# Patient Record
Sex: Female | Born: 1950
Health system: Southern US, Community
[De-identification: ages and names within clinical notes are randomized; demographics above are authoritative.]

## PROBLEM LIST (undated history)

## (undated) DIAGNOSIS — I10 Essential (primary) hypertension: Secondary | ICD-10-CM

## (undated) DIAGNOSIS — G4733 Obstructive sleep apnea (adult) (pediatric): Secondary | ICD-10-CM

## (undated) DIAGNOSIS — Z853 Personal history of malignant neoplasm of breast: Secondary | ICD-10-CM

## (undated) DIAGNOSIS — J849 Interstitial pulmonary disease, unspecified: Secondary | ICD-10-CM

## (undated) DIAGNOSIS — E119 Type 2 diabetes mellitus without complications: Secondary | ICD-10-CM

## (undated) DIAGNOSIS — I2699 Other pulmonary embolism without acute cor pulmonale: Secondary | ICD-10-CM

## (undated) DIAGNOSIS — R809 Proteinuria, unspecified: Secondary | ICD-10-CM

## (undated) DIAGNOSIS — K635 Polyp of colon: Secondary | ICD-10-CM

## (undated) DIAGNOSIS — E785 Hyperlipidemia, unspecified: Secondary | ICD-10-CM

## (undated) DIAGNOSIS — N183 Chronic kidney disease, stage 3 unspecified: Secondary | ICD-10-CM

## (undated) DIAGNOSIS — K5790 Diverticulosis of intestine, part unspecified, without perforation or abscess without bleeding: Secondary | ICD-10-CM

## (undated) DIAGNOSIS — M25812 Other specified joint disorders, left shoulder: Secondary | ICD-10-CM

## (undated) DIAGNOSIS — M7542 Impingement syndrome of left shoulder: Secondary | ICD-10-CM

## (undated) DIAGNOSIS — C801 Malignant (primary) neoplasm, unspecified: Secondary | ICD-10-CM

## (undated) DIAGNOSIS — M75100 Unspecified rotator cuff tear or rupture of unspecified shoulder, not specified as traumatic: Secondary | ICD-10-CM

## (undated) DIAGNOSIS — G4730 Sleep apnea, unspecified: Secondary | ICD-10-CM

## (undated) DIAGNOSIS — D649 Anemia, unspecified: Secondary | ICD-10-CM

## (undated) DIAGNOSIS — D689 Coagulation defect, unspecified: Secondary | ICD-10-CM

## (undated) DIAGNOSIS — I509 Heart failure, unspecified: Secondary | ICD-10-CM

## (undated) DIAGNOSIS — I428 Other cardiomyopathies: Secondary | ICD-10-CM

## (undated) DIAGNOSIS — Z9221 Personal history of antineoplastic chemotherapy: Secondary | ICD-10-CM

## (undated) HISTORY — DX: Chronic kidney disease, stage 3 (moderate): N18.3

## (undated) HISTORY — PX: PORT-A-CATH REMOVAL: SHX5289

## (undated) HISTORY — DX: Anemia, unspecified: D64.9

## (undated) HISTORY — DX: Heart failure, unspecified: I50.9

## (undated) HISTORY — PX: BREAST SURGERY: SHX581

## (undated) HISTORY — DX: Personal history of malignant neoplasm of breast: Z85.3

## (undated) HISTORY — PX: OTHER SURGICAL HISTORY: SHX169

## (undated) HISTORY — DX: Polyp of colon: K63.5

## (undated) HISTORY — DX: Coagulation defect, unspecified: D68.9

## (undated) HISTORY — DX: Proteinuria, unspecified: R80.9

## (undated) HISTORY — DX: Obstructive sleep apnea (adult) (pediatric): G47.33

## (undated) HISTORY — DX: Hyperlipidemia, unspecified: E78.5

## (undated) HISTORY — DX: Sleep apnea, unspecified: G47.30

## (undated) HISTORY — DX: Chronic kidney disease, stage 3 unspecified: N18.30

## (undated) HISTORY — DX: Essential (primary) hypertension: I10

## (undated) HISTORY — DX: Other pulmonary embolism without acute cor pulmonale: I26.99

## (undated) HISTORY — DX: Diverticulosis of intestine, part unspecified, without perforation or abscess without bleeding: K57.90

## (undated) HISTORY — PX: DILATION AND CURETTAGE OF UTERUS: SHX78

## (undated) HISTORY — DX: Malignant (primary) neoplasm, unspecified: C80.1

## (undated) HISTORY — DX: Type 2 diabetes mellitus without complications: E11.9

---

## 1993-05-16 DIAGNOSIS — Z853 Personal history of malignant neoplasm of breast: Secondary | ICD-10-CM

## 1993-05-16 HISTORY — DX: Personal history of malignant neoplasm of breast: Z85.3

## 1993-05-16 HISTORY — PX: MASTECTOMY: SHX3

## 1999-09-05 ENCOUNTER — Encounter: Payer: Self-pay | Admitting: Pulmonary Disease

## 2002-07-04 ENCOUNTER — Ambulatory Visit (HOSPITAL_COMMUNITY): Admission: RE | Admit: 2002-07-04 | Discharge: 2002-07-04 | Payer: Self-pay | Admitting: Gastroenterology

## 2003-02-10 ENCOUNTER — Ambulatory Visit (HOSPITAL_BASED_OUTPATIENT_CLINIC_OR_DEPARTMENT_OTHER): Admission: RE | Admit: 2003-02-10 | Discharge: 2003-02-10 | Payer: Self-pay | Admitting: Internal Medicine

## 2005-02-27 ENCOUNTER — Emergency Department (HOSPITAL_COMMUNITY): Admission: AD | Admit: 2005-02-27 | Discharge: 2005-02-27 | Payer: Self-pay | Admitting: Family Medicine

## 2005-08-16 ENCOUNTER — Encounter: Payer: Self-pay | Admitting: Pulmonary Disease

## 2007-05-14 ENCOUNTER — Emergency Department (HOSPITAL_COMMUNITY): Admission: EM | Admit: 2007-05-14 | Discharge: 2007-05-14 | Payer: Self-pay | Admitting: Emergency Medicine

## 2008-05-12 ENCOUNTER — Emergency Department (HOSPITAL_COMMUNITY): Admission: EM | Admit: 2008-05-12 | Discharge: 2008-05-12 | Payer: Self-pay | Admitting: Family Medicine

## 2008-08-29 ENCOUNTER — Emergency Department (HOSPITAL_COMMUNITY): Admission: EM | Admit: 2008-08-29 | Discharge: 2008-08-29 | Payer: Self-pay | Admitting: Emergency Medicine

## 2008-09-23 ENCOUNTER — Inpatient Hospital Stay (HOSPITAL_COMMUNITY): Admission: EM | Admit: 2008-09-23 | Discharge: 2008-09-25 | Payer: Self-pay | Admitting: Emergency Medicine

## 2008-09-24 ENCOUNTER — Encounter (INDEPENDENT_AMBULATORY_CARE_PROVIDER_SITE_OTHER): Payer: Self-pay | Admitting: Internal Medicine

## 2008-09-24 ENCOUNTER — Ambulatory Visit: Payer: Self-pay | Admitting: Vascular Surgery

## 2008-09-24 DIAGNOSIS — I2699 Other pulmonary embolism without acute cor pulmonale: Secondary | ICD-10-CM

## 2008-09-24 HISTORY — DX: Other pulmonary embolism without acute cor pulmonale: I26.99

## 2008-09-26 DIAGNOSIS — I10 Essential (primary) hypertension: Secondary | ICD-10-CM | POA: Insufficient documentation

## 2008-09-26 DIAGNOSIS — Z853 Personal history of malignant neoplasm of breast: Secondary | ICD-10-CM

## 2008-09-26 DIAGNOSIS — D649 Anemia, unspecified: Secondary | ICD-10-CM | POA: Insufficient documentation

## 2008-09-29 ENCOUNTER — Ambulatory Visit: Payer: Self-pay | Admitting: Internal Medicine

## 2008-09-29 DIAGNOSIS — E119 Type 2 diabetes mellitus without complications: Secondary | ICD-10-CM | POA: Insufficient documentation

## 2008-09-29 LAB — CONVERTED CEMR LAB
INR: 2 — ABNORMAL HIGH (ref 0.8–1.0)
Prothrombin Time: 20.4 s — ABNORMAL HIGH (ref 10.9–13.3)

## 2008-10-01 ENCOUNTER — Telehealth: Payer: Self-pay | Admitting: Internal Medicine

## 2008-10-02 ENCOUNTER — Ambulatory Visit: Payer: Self-pay | Admitting: Internal Medicine

## 2008-10-07 ENCOUNTER — Ambulatory Visit: Payer: Self-pay | Admitting: Cardiology

## 2008-10-08 LAB — CONVERTED CEMR LAB
INR: 4.7 — ABNORMAL HIGH (ref 0.8–1.0)
Prothrombin Time: 46.7 s — ABNORMAL HIGH (ref 10.9–13.3)

## 2008-10-14 ENCOUNTER — Encounter: Payer: Self-pay | Admitting: *Deleted

## 2008-10-14 ENCOUNTER — Ambulatory Visit: Payer: Self-pay | Admitting: Cardiology

## 2008-10-14 ENCOUNTER — Ambulatory Visit: Payer: Self-pay | Admitting: Internal Medicine

## 2008-10-14 DIAGNOSIS — G4733 Obstructive sleep apnea (adult) (pediatric): Secondary | ICD-10-CM

## 2008-10-14 LAB — CONVERTED CEMR LAB
ALT: 23 units/L (ref 0–35)
AST: 21 units/L (ref 0–37)
Albumin: 3.7 g/dL (ref 3.5–5.2)
Alkaline Phosphatase: 64 units/L (ref 39–117)
BUN: 32 mg/dL — ABNORMAL HIGH (ref 6–23)
Basophils Absolute: 0 10*3/uL (ref 0.0–0.1)
Basophils Relative: 0.9 % (ref 0.0–3.0)
Bilirubin, Direct: 0.1 mg/dL (ref 0.0–0.3)
CO2: 28 meq/L (ref 19–32)
Calcium: 9.8 mg/dL (ref 8.4–10.5)
Chloride: 110 meq/L (ref 96–112)
Cholesterol: 204 mg/dL — ABNORMAL HIGH (ref 0–200)
Creatinine, Ser: 1.9 mg/dL — ABNORMAL HIGH (ref 0.4–1.2)
Direct LDL: 141.3 mg/dL
Eosinophils Absolute: 0.1 10*3/uL (ref 0.0–0.7)
Eosinophils Relative: 3.2 % (ref 0.0–5.0)
GFR calc non Af Amer: 34.73 mL/min (ref >60)
Glucose, Bld: 162 mg/dL — ABNORMAL HIGH (ref 70–99)
HCT: 32.6 % — ABNORMAL LOW (ref 36.0–46.0)
HDL: 48.8 mg/dL (ref >39.00)
Hemoglobin: 11.2 g/dL — ABNORMAL LOW (ref 12.0–15.0)
Hgb A1c MFr Bld: 6.2 % (ref 4.6–6.5)
Lymphocytes Relative: 39.8 % (ref 12.0–46.0)
Lymphs Abs: 1.7 10*3/uL (ref 0.7–4.0)
MCHC: 34.4 g/dL (ref 30.0–36.0)
MCV: 89.6 fL (ref 78.0–100.0)
Monocytes Absolute: 0.3 10*3/uL (ref 0.1–1.0)
Monocytes Relative: 7.5 % (ref 3.0–12.0)
Neutro Abs: 2.2 10*3/uL (ref 1.4–7.7)
Neutrophils Relative %: 48.6 % (ref 43.0–77.0)
POC INR: 3.1
Platelets: 239 10*3/uL (ref 150.0–400.0)
Potassium: 5.1 meq/L (ref 3.5–5.1)
Protime: 21.1
RBC: 3.64 M/uL — ABNORMAL LOW (ref 3.87–5.11)
RDW: 13.2 % (ref 11.5–14.6)
Sodium: 141 meq/L (ref 135–145)
TSH: 2.62 microintl units/mL (ref 0.35–5.50)
Total Bilirubin: 0.7 mg/dL (ref 0.3–1.2)
Total CHOL/HDL Ratio: 4
Total Protein: 7.5 g/dL (ref 6.0–8.3)
Triglycerides: 121 mg/dL (ref 0.0–149.0)
VLDL: 24.2 mg/dL (ref 0.0–40.0)
WBC: 4.3 10*3/uL — ABNORMAL LOW (ref 4.5–10.5)

## 2008-10-23 ENCOUNTER — Ambulatory Visit: Payer: Self-pay | Admitting: Pulmonary Disease

## 2008-10-24 ENCOUNTER — Ambulatory Visit: Payer: Self-pay | Admitting: Cardiovascular Disease

## 2008-10-24 LAB — CONVERTED CEMR LAB
POC INR: 2.7
Protime: 19.9

## 2008-10-31 ENCOUNTER — Ambulatory Visit: Payer: Self-pay | Admitting: Internal Medicine

## 2008-10-31 ENCOUNTER — Telehealth (INDEPENDENT_AMBULATORY_CARE_PROVIDER_SITE_OTHER): Payer: Self-pay | Admitting: *Deleted

## 2008-10-31 ENCOUNTER — Encounter (INDEPENDENT_AMBULATORY_CARE_PROVIDER_SITE_OTHER): Payer: Self-pay | Admitting: Cardiology

## 2008-10-31 LAB — CONVERTED CEMR LAB
POC INR: 2.3
Protime: 18.7

## 2008-11-14 ENCOUNTER — Ambulatory Visit: Payer: Self-pay | Admitting: Cardiovascular Disease

## 2008-11-14 LAB — CONVERTED CEMR LAB
POC INR: 2.9
Prothrombin Time: 20.5 s

## 2008-11-18 ENCOUNTER — Telehealth (INDEPENDENT_AMBULATORY_CARE_PROVIDER_SITE_OTHER): Payer: Self-pay | Admitting: Cardiology

## 2008-11-19 ENCOUNTER — Encounter: Payer: Self-pay | Admitting: *Deleted

## 2008-12-01 ENCOUNTER — Ambulatory Visit: Payer: Self-pay | Admitting: Internal Medicine

## 2008-12-05 ENCOUNTER — Ambulatory Visit: Payer: Self-pay | Admitting: Internal Medicine

## 2008-12-05 LAB — CONVERTED CEMR LAB
POC INR: 2.9
Prothrombin Time: 20.5 s

## 2008-12-26 ENCOUNTER — Ambulatory Visit: Payer: Self-pay | Admitting: Internal Medicine

## 2008-12-26 LAB — CONVERTED CEMR LAB: POC INR: 2.3

## 2009-01-23 ENCOUNTER — Ambulatory Visit: Payer: Self-pay | Admitting: Internal Medicine

## 2009-01-23 LAB — CONVERTED CEMR LAB: POC INR: 2.8

## 2009-01-26 ENCOUNTER — Telehealth: Payer: Self-pay | Admitting: Internal Medicine

## 2009-01-27 ENCOUNTER — Telehealth: Payer: Self-pay | Admitting: Internal Medicine

## 2009-01-27 ENCOUNTER — Encounter: Payer: Self-pay | Admitting: Internal Medicine

## 2009-02-05 ENCOUNTER — Encounter: Payer: Self-pay | Admitting: Internal Medicine

## 2009-02-07 ENCOUNTER — Encounter: Payer: Self-pay | Admitting: Pulmonary Disease

## 2009-02-18 ENCOUNTER — Telehealth: Payer: Self-pay | Admitting: Pulmonary Disease

## 2009-02-20 ENCOUNTER — Ambulatory Visit: Payer: Self-pay | Admitting: Internal Medicine

## 2009-02-20 LAB — CONVERTED CEMR LAB: POC INR: 3.4

## 2009-03-13 ENCOUNTER — Ambulatory Visit: Payer: Self-pay | Admitting: Internal Medicine

## 2009-03-13 LAB — CONVERTED CEMR LAB: POC INR: 3.9

## 2009-03-27 ENCOUNTER — Ambulatory Visit: Payer: Self-pay | Admitting: Cardiology

## 2009-03-27 LAB — CONVERTED CEMR LAB: POC INR: 3.9

## 2009-04-10 ENCOUNTER — Ambulatory Visit: Payer: Self-pay | Admitting: Internal Medicine

## 2009-04-10 LAB — CONVERTED CEMR LAB: POC INR: 2.1

## 2009-05-01 ENCOUNTER — Ambulatory Visit: Payer: Self-pay | Admitting: Cardiovascular Disease

## 2009-05-01 LAB — CONVERTED CEMR LAB: POC INR: 2.5

## 2009-05-15 ENCOUNTER — Emergency Department (HOSPITAL_COMMUNITY): Admission: EM | Admit: 2009-05-15 | Discharge: 2009-05-15 | Payer: Self-pay | Admitting: Family Medicine

## 2009-05-19 ENCOUNTER — Telehealth: Payer: Self-pay | Admitting: Internal Medicine

## 2009-05-29 ENCOUNTER — Ambulatory Visit: Payer: Self-pay | Admitting: Internal Medicine

## 2009-05-29 LAB — CONVERTED CEMR LAB: POC INR: 3.4

## 2009-06-26 ENCOUNTER — Ambulatory Visit: Payer: Self-pay | Admitting: Internal Medicine

## 2009-06-26 ENCOUNTER — Ambulatory Visit: Payer: Self-pay | Admitting: Pulmonary Disease

## 2009-06-26 DIAGNOSIS — R809 Proteinuria, unspecified: Secondary | ICD-10-CM

## 2009-06-26 DIAGNOSIS — N184 Chronic kidney disease, stage 4 (severe): Secondary | ICD-10-CM

## 2009-06-26 LAB — CONVERTED CEMR LAB: POC INR: 2.8

## 2009-06-27 LAB — CONVERTED CEMR LAB
BUN: 23 mg/dL (ref 6–23)
Bilirubin Urine: NEGATIVE
CO2: 32 meq/L (ref 19–32)
Calcium: 10 mg/dL (ref 8.4–10.5)
Chloride: 105 meq/L (ref 96–112)
Creatinine, Ser: 1.9 mg/dL — ABNORMAL HIGH (ref 0.4–1.2)
GFR calc non Af Amer: 34.65 mL/min (ref >60)
Glucose, Bld: 115 mg/dL — ABNORMAL HIGH (ref 70–99)
Hemoglobin, Urine: NEGATIVE
Hgb A1c MFr Bld: 6.2 % (ref 4.6–6.5)
Ketones, ur: NEGATIVE mg/dL
Leukocytes, UA: NEGATIVE
Nitrite: NEGATIVE
Potassium: 3.8 meq/L (ref 3.5–5.1)
Sodium: 142 meq/L (ref 135–145)
Specific Gravity, Urine: 1.015 (ref 1.000–1.030)
Total Protein, Urine: 30 mg/dL
Urine Glucose: NEGATIVE mg/dL
Urobilinogen, UA: 0.2 (ref 0.0–1.0)
pH: 6 (ref 5.0–8.0)

## 2009-07-01 ENCOUNTER — Telehealth: Payer: Self-pay | Admitting: Pulmonary Disease

## 2009-07-03 ENCOUNTER — Telehealth: Payer: Self-pay | Admitting: Internal Medicine

## 2009-07-07 ENCOUNTER — Ambulatory Visit (HOSPITAL_COMMUNITY): Admission: RE | Admit: 2009-07-07 | Discharge: 2009-07-07 | Payer: Self-pay | Admitting: Internal Medicine

## 2009-07-07 LAB — HM MAMMOGRAPHY

## 2009-07-28 ENCOUNTER — Ambulatory Visit: Payer: Self-pay | Admitting: Cardiology

## 2009-07-28 LAB — CONVERTED CEMR LAB: POC INR: 3.4

## 2009-07-29 ENCOUNTER — Encounter: Payer: Self-pay | Admitting: Internal Medicine

## 2009-08-31 ENCOUNTER — Telehealth: Payer: Self-pay | Admitting: Internal Medicine

## 2009-09-01 ENCOUNTER — Ambulatory Visit: Payer: Self-pay | Admitting: Cardiovascular Disease

## 2009-09-18 ENCOUNTER — Ambulatory Visit: Payer: Self-pay | Admitting: Internal Medicine

## 2009-09-18 LAB — CONVERTED CEMR LAB: POC INR: 4.3

## 2009-09-23 ENCOUNTER — Inpatient Hospital Stay (HOSPITAL_COMMUNITY): Admission: EM | Admit: 2009-09-23 | Discharge: 2009-09-24 | Payer: Self-pay | Admitting: Emergency Medicine

## 2009-09-23 ENCOUNTER — Encounter: Payer: Self-pay | Admitting: Internal Medicine

## 2009-09-23 LAB — CONVERTED CEMR LAB
BUN: 32 mg/dL
CO2: 26 meq/L
Calcium: 9.4 mg/dL
Chloride: 101 meq/L
Creatinine, Ser: 2.29 mg/dL
Glucose, Bld: 261 mg/dL
Hgb A1c MFr Bld: 12.2 %
Potassium: 3.9 meq/L
Sodium: 134 meq/L

## 2009-09-24 ENCOUNTER — Encounter: Payer: Self-pay | Admitting: Internal Medicine

## 2009-09-24 LAB — CONVERTED CEMR LAB
ALT: 20 units/L
ALT: 20 units/L
AST: 26 units/L
AST: 26 units/L
Albumin: 3.2 g/dL
Albumin: 3.2 g/dL
Alkaline Phosphatase: 91 units/L
Alkaline Phosphatase: 91 units/L
BUN: 27 mg/dL
BUN: 27 mg/dL
CO2: 25 meq/L
CO2: 25 meq/L
Calcium: 9 mg/dL
Calcium: 9 mg/dL
Chloride: 104 meq/L
Chloride: 104 meq/L
Creatinine, Ser: 2.12 mg/dL
Creatinine, Ser: 2.12 mg/dL
Glucose, Bld: 371 mg/dL
Glucose, Bld: 371 mg/dL
Hgb A1c MFr Bld: 12.5 %
Potassium: 4.3 meq/L
Potassium: 4.3 meq/L
Sodium: 134 meq/L
Sodium: 134 meq/L
Total Bilirubin: 0.5 mg/dL
Total Bilirubin: 0.5 mg/dL
Total Protein: 6.3 g/dL
Total Protein: 6.3 g/dL

## 2009-09-28 ENCOUNTER — Encounter: Payer: Self-pay | Admitting: Internal Medicine

## 2009-09-29 ENCOUNTER — Ambulatory Visit: Payer: Self-pay | Admitting: Internal Medicine

## 2009-10-01 ENCOUNTER — Telehealth: Payer: Self-pay | Admitting: Internal Medicine

## 2009-10-02 ENCOUNTER — Ambulatory Visit: Payer: Self-pay | Admitting: Internal Medicine

## 2009-10-02 ENCOUNTER — Ambulatory Visit: Payer: Self-pay | Admitting: Cardiology

## 2009-10-02 LAB — CONVERTED CEMR LAB
BUN: 28 mg/dL — ABNORMAL HIGH (ref 6–23)
CO2: 30 meq/L (ref 19–32)
Calcium: 9.3 mg/dL (ref 8.4–10.5)
Chloride: 109 meq/L (ref 96–112)
Creatinine, Ser: 1.6 mg/dL — ABNORMAL HIGH (ref 0.4–1.2)
GFR calc non Af Amer: 42.52 mL/min (ref >60)
Glucose, Bld: 129 mg/dL — ABNORMAL HIGH (ref 70–99)
POC INR: 1.6
Potassium: 5.2 meq/L — ABNORMAL HIGH (ref 3.5–5.1)
Sodium: 143 meq/L (ref 135–145)

## 2009-10-06 ENCOUNTER — Ambulatory Visit: Payer: Self-pay | Admitting: Endocrinology

## 2009-10-14 ENCOUNTER — Encounter: Payer: Self-pay | Admitting: Internal Medicine

## 2009-10-15 ENCOUNTER — Encounter: Payer: Self-pay | Admitting: Family Medicine

## 2009-10-16 ENCOUNTER — Ambulatory Visit: Payer: Self-pay | Admitting: Cardiovascular Disease

## 2009-10-16 LAB — CONVERTED CEMR LAB: POC INR: 1.5

## 2009-10-19 ENCOUNTER — Telehealth: Payer: Self-pay | Admitting: Family Medicine

## 2009-10-23 ENCOUNTER — Encounter: Payer: Self-pay | Admitting: Internal Medicine

## 2009-10-30 ENCOUNTER — Ambulatory Visit: Payer: Self-pay | Admitting: Cardiology

## 2009-10-30 LAB — CONVERTED CEMR LAB: POC INR: 1.7

## 2009-11-04 ENCOUNTER — Encounter: Payer: Self-pay | Admitting: Internal Medicine

## 2009-11-04 LAB — HM DIABETES EYE EXAM: HM Diabetic Eye Exam: NORMAL

## 2009-11-12 ENCOUNTER — Ambulatory Visit: Payer: Self-pay | Admitting: Family Medicine

## 2009-11-12 ENCOUNTER — Ambulatory Visit: Payer: Self-pay | Admitting: Cardiology

## 2009-11-12 LAB — CONVERTED CEMR LAB: POC INR: 2.1

## 2009-11-18 ENCOUNTER — Telehealth: Payer: Self-pay | Admitting: Internal Medicine

## 2009-11-18 ENCOUNTER — Encounter: Payer: Self-pay | Admitting: Internal Medicine

## 2009-12-03 ENCOUNTER — Ambulatory Visit: Payer: Self-pay | Admitting: Cardiology

## 2009-12-08 ENCOUNTER — Ambulatory Visit: Payer: Self-pay | Admitting: Family Medicine

## 2009-12-16 ENCOUNTER — Telehealth: Payer: Self-pay | Admitting: Internal Medicine

## 2009-12-29 ENCOUNTER — Ambulatory Visit: Payer: Self-pay | Admitting: Internal Medicine

## 2009-12-29 LAB — CONVERTED CEMR LAB
HDL: 44 mg/dL (ref >39.00)
Hgb A1c MFr Bld: 5.8 % (ref 4.6–6.5)
LDL Cholesterol: 104 mg/dL — ABNORMAL HIGH (ref 0–99)
Total CHOL/HDL Ratio: 4
VLDL: 34.6 mg/dL (ref 0.0–40.0)

## 2009-12-31 ENCOUNTER — Ambulatory Visit: Payer: Self-pay | Admitting: Internal Medicine

## 2009-12-31 LAB — CONVERTED CEMR LAB: POC INR: 2.4

## 2010-01-06 ENCOUNTER — Encounter: Payer: Self-pay | Admitting: Internal Medicine

## 2010-01-28 ENCOUNTER — Ambulatory Visit: Payer: Self-pay | Admitting: Internal Medicine

## 2010-01-28 LAB — CONVERTED CEMR LAB: POC INR: 2

## 2010-02-03 ENCOUNTER — Telehealth: Payer: Self-pay | Admitting: Internal Medicine

## 2010-02-10 ENCOUNTER — Encounter: Payer: Self-pay | Admitting: Internal Medicine

## 2010-02-15 ENCOUNTER — Encounter: Payer: Self-pay | Admitting: Internal Medicine

## 2010-02-18 ENCOUNTER — Telehealth: Payer: Self-pay | Admitting: Internal Medicine

## 2010-03-04 ENCOUNTER — Ambulatory Visit: Payer: Self-pay | Admitting: Cardiovascular Disease

## 2010-03-10 ENCOUNTER — Encounter: Payer: Self-pay | Admitting: Internal Medicine

## 2010-03-16 ENCOUNTER — Encounter: Payer: Self-pay | Admitting: Internal Medicine

## 2010-03-30 ENCOUNTER — Ambulatory Visit: Payer: Self-pay | Admitting: Cardiovascular Disease

## 2010-03-30 ENCOUNTER — Encounter: Payer: Self-pay | Admitting: Cardiovascular Disease

## 2010-03-30 ENCOUNTER — Ambulatory Visit: Payer: Self-pay | Admitting: Internal Medicine

## 2010-04-02 ENCOUNTER — Encounter: Payer: Self-pay | Admitting: Internal Medicine

## 2010-04-27 ENCOUNTER — Ambulatory Visit: Payer: Self-pay

## 2010-06-02 ENCOUNTER — Telehealth: Payer: Self-pay | Admitting: Internal Medicine

## 2010-06-02 DIAGNOSIS — M79609 Pain in unspecified limb: Secondary | ICD-10-CM | POA: Insufficient documentation

## 2010-06-04 ENCOUNTER — Ambulatory Visit: Admission: RE | Admit: 2010-06-04 | Discharge: 2010-06-04 | Payer: Self-pay | Source: Home / Self Care

## 2010-06-04 ENCOUNTER — Encounter: Payer: Self-pay | Admitting: Internal Medicine

## 2010-06-15 NOTE — Progress Notes (Signed)
Summary: ferrex  Phone Note Refill Request Message from:  Fax from Pharmacy on August 31, 2009 11:15 AM  Refills Requested: Medication #1:  Ferrex 150mg  take 1 po qd  # 90   Last Refilled: 05/29/2009  Method Requested: Electronic Initial call taken by: Tomma Lightning,  August 31, 2009 11:16 AM  Follow-up for Phone Call        Med not on med list. Is it ok to refill? Follow-up by: Tomma Lightning,  August 31, 2009 11:17 AM  Additional Follow-up for Phone Call Additional follow up Details #1::        yes - ok to fill as requested - thanks Additional Follow-up by: Rowe Clack MD,  August 31, 2009 12:03 PM    New/Updated Medications: FERREX 150 150 MG CAPS (POLYSACCHARIDE IRON COMPLEX) take 1 by mouth once daily Prescriptions: FERREX 150 150 MG CAPS (POLYSACCHARIDE IRON COMPLEX) take 1 by mouth once daily  #90 x 2   Entered by:   Tomma Lightning   Authorized by:   Rowe Clack MD   Signed by:   Tomma Lightning on 08/31/2009   Method used:   Electronically to        [facility] (retail)       [address]       [place], [place]  [address]       Ph: [phone]       Fax: [phone]   RxID:   [id number]

## 2010-06-15 NOTE — Letter (Signed)
Summary: [facility]   Imported By: Phillis Knack 07/01/2009 11:50:25  _____________________________________________________________________  External Attachment:    Type:   Image     Comment:   External Document

## 2010-06-15 NOTE — Assessment & Plan Note (Signed)
Summary: post hosp/high blood sugar 600/diabetes/lb   Vital Signs:  Patient profile:   60 year old female Height:      62 inches (157.48 cm) Weight:      226.0 pounds (102.73 kg) O2 Sat:      96 % on Room air Temp:     97.7 degrees F (36.50 degrees C) oral Pulse rate:   81 / minute BP sitting:   120 / 80  (left arm) Cuff size:   regular  Vitals Entered By: Tomma Lightning (Sep 29, 2009 8:40 AM)  O2 Flow:  Room air CC: Hosp f/u. Pt states she was to hold cozaar, lisinopril, and furosemide until office visit Is Patient Diabetic? Yes Did you bring your meter with you today? No Pain Assessment Patient in pain? no        Primary Care Provider:  Rowe Clack MD  CC:  Hosp f/u. Pt states she was to hold cozaar, lisinopril, and and furosemide until office visit.  History of Present Illness: here for hosp f/u at [facility]5/[facility]-12 with HONK - incontrolled dm with dehydration and renal insuff hosp course, labs reviewed -  1) DM2 - as above - prev diet controlled - prior a1c reviewed 6.2 in 6/10, 6.2 in 2/11 - now on insulin - home log reviewed - checking cbgs 3-4x/d - no hypoglycemic events or symptoms - continued PU/PD but less dramatic than before hosp  2) HTN - holding meds as per hosp instructions - no CP or HA, no vision change or lightheaded symptoms  - compliance with meds as per hosp instructions -  3) CKD - worried that renal fx was worse in hosp than it had been - edema is better than usual - no confusion or twitching - now seen by renal since last OV here  4) PE hx 09/2008 - on anticoag (but did not resume at dc, pt misunderstanding instuctions) - no CP or SOB, no leg swelling - denies weight loss -  Preventive Screening-Counseling & Management  Alcohol-Tobacco     Alcohol drinks/day: 0     Smoking Status: never  Clinical Review Panels:  Diabetes Management   HgBA1C:  12.5 (09/24/2009)   Creatinine:  2.12 (09/24/2009)   Last Foot Exam:  yes (09/29/2008)   Last Flu  Vaccine:  Historical (02/13/2009)   Last Pneumovax:  Pneumovax (06/26/2009)  CBC   WBC:  4.3 (10/14/2008)   RBC:  3.64 (10/14/2008)   Hgb:  11.2 (10/14/2008)   Hct:  32.6 (10/14/2008)   Platelets:  239.0 (10/14/2008)   MCV  89.6 (10/14/2008)   MCHC  34.4 (10/14/2008)   RDW  13.2 (10/14/2008)   PMN:  48.6 (10/14/2008)   Lymphs:  39.8 (10/14/2008)   Monos:  7.5 (10/14/2008)   Eosinophils:  3.2 (10/14/2008)   Basophil:  0.9 (10/14/2008)  Complete Metabolic Panel   Glucose:  371 (09/24/2009)   Sodium:  134 (09/24/2009)   Potassium:  4.3 (09/24/2009)   Chloride:  104 (09/24/2009)   CO2:  25 (09/24/2009)   BUN:  27 (09/24/2009)   Creatinine:  2.12 (09/24/2009)   Albumin:  3.2 (09/24/2009)   Total Protein:  6.3 (09/24/2009)   Calcium:  9.0 (09/24/2009)   Total Bili:  0.5 (09/24/2009)   Alk Phos:  91 (09/24/2009)   SGPT (ALT):  20 (09/24/2009)   SGOT (AST):  26 (09/24/2009)   -  Date:  09/24/2009    HgbA1c: 12.5  Current Medications (verified): 1)  Coumadin 5 Mg Tabs (  Warfarin Sodium) .[name].. 1 By Mouth Daily or As Directed 2)  Proair Hfa 108 (90 Base) Mcg/act Aers (Albuterol Sulfate) .... Take 2 Puffs Q 4 Hours Prn 3)  Norvasc 10 Mg Tabs (Amlodipine Besylate) .... Take 1 By Mouth Qd 4)  Coreg 25 Mg Tabs (Carvedilol) .... Take 1 Two Times A Day 5)  Cozaar 100 Mg Tabs (Losartan Potassium) .... Take 1 By Mouth Qd 6)  Lisinopril 20 Mg Tabs (Lisinopril) .... Take 1 Two Times A Day 7)  Pravastatin Sodium 40 Mg Tabs (Pravastatin Sodium) .... Take 1 At Bedtime 8)  Furosemide 20 Mg Tabs (Furosemide) .... Take 1 Two Times A Day 9)  Ferrex 150 150 Mg Caps (Polysaccharide Iron Complex) .... Take 1 By Mouth Once Daily 10)  Lantus 100 Unit/ml Soln (Insulin Glargine) .... Take 30 Units Two Times A Day 11)  Humulin R 100 Unit/ml Soln (Insulin Regular Human) .... Take 5 Units Three Times A Day  Allergies (verified): No Known Drug Allergies  Past History:  Past Medical  History: Anemia-NOS Diabetes mellitus, type II Hyperlipidemia Hypertension Renal insufficiency - CKD 3 Pulmonary embolism 09/2008 Breast cancer, hx of OSA - CPAP    MD rooster: pulm -[facility] - [place]  Past Surgical History: Mastectomy 1995     Family History: Reviewed history from 09/29/2008 and no changes required. father passed unexpectedly, age. 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 nonsmoker, no alcohol  Review of Systems  The patient denies fever, chest pain, syncope, and abdominal pain.         also see HPI above. I have reviewed all other systems and they were negative.   Physical Exam  General:  obese, alert, well-developed, well-nourished, and cooperative to examination.    Lungs:  normal respiratory effort, no intercostal retractions or use of accessory muscles; normal breath sounds bilaterally - no crackles and no wheezes.    Heart:  normal rate, regular rhythm, no murmur, and no rub. BLE with improvement in mild chronic edema L>R.  Psych:  Oriented X3, memory intact for recent and remote, normally interactive, good eye contact, not anxious appearing, not depressed appearing, and not agitated.      Impression & Recommendations:  Problem # 1:  DIABETES MELLITUS, TYPE II (ICD-250.00)  previously diet controlled - a1c reviewed: 6.2 on june 20101, 6.2 on Feb 2011 - now s/p hosp for HONK with uncontrolled DM >12 - on lantus + humulin R three times a day - home logs reviewed since dc: range 190-400 increase Lantus, refer endo to review - resume ARB but hold ACEI pending Cr recheck - see next has plans to see dietian/nutritionist this week to cont education - Her updated medication list for this problem includes:    Cozaar 100 Mg Tabs (Losartan potassium) .[name]... Take 1 by mouth qd    Lisinopril 20 Mg  Tabs (Lisinopril) ..... Hold until further notice    Lantus 100 Unit/ml Soln (Insulin glargine) .[name]... Take 30 units two times a day    Humulin R 100 Unit/ml Soln (Insulin regular human) .[name]... Take 5 units three times a day  Orders: Endocrinology Referral (Endocrine)  Labs Reviewed: Creat: 2.12 (09/24/2009)    Reviewed HgBA1c results: 12.5 (09/24/2009)  12.2 (09/23/2009)  Time spent with patient 40 minutes, more than 50% of this time was spent counseling patient on recent hospitalization,  reviewing course and change in DM mgmt and plans for cont education and speciality evaluation  Problem # 2:  CHRONIC KIDNEY DISEASE STAGE III (MODERATE) (ICD-585.3)  renal note 07/2009 reviewed - also hosp reviewed exac by dehydration with HONK hosp - dc Cr 2.2, baseline 1.6 cont holding scheduled furosemide  and ACEI but resume ARB - recheck labs this week to monitor same  Labs Reviewed: BUN: 27 (09/24/2009)   Cr: 2.12 (09/24/2009)    Hgb: 11.2 (10/14/2008)   Hct: 32.6 (10/14/2008)   Ca++: 9.0 (09/24/2009)    TP: 6.3 (09/24/2009)   Alb: 3.2 (09/24/2009)  Problem # 3:  HYPERTENSION (ICD-401.9)  Her updated medication list for this problem includes:    Norvasc 10 Mg Tabs (Amlodipine besylate) .[name]... Take 1 by mouth qd    Coreg 25 Mg Tabs (Carvedilol) .[name]... Take 1 two times a day    Cozaar 100 Mg Tabs (Losartan potassium) .[name]... Take 1 by mouth qd    Lisinopril 20 Mg Tabs (Lisinopril) ..... Hold until further notice    Furosemide 20 Mg Tabs (Furosemide) .[name]... Take 1 by mouth once daily as needed for swelling  BP today: 120/80 Prior BP: 118/72 (06/26/2009)  Labs Reviewed: K+: 4.3 (09/24/2009) Creat: : 2.12 (09/24/2009)   Chol: 204 (10/14/2008)   HDL: 48.80 (10/14/2008)   TG: 121.0 (10/14/2008)  Problem # 4:  PULMONARY EMBOLISM (ICD-415.19)  tx x 12 mo ongoing for hx same summer 2010 - mgmt per [facility] -   Her updated medication list for this problem includes:    Coumadin 5 Mg Tabs (Warfarin  sodium) .[name]... 1 by mouth daily or as directed  may need opinion from heme onc as well, especially with hx of breast ca....  Reviewed the following: PT: 20.5 (12/05/2008)   INR: 4.7 ratio (10/07/2008)    Coumadin Dose (weekly): 25 mg (09/18/2009) Prior Coumadin Dose (weekly): 25 mg (09/18/2009) Next Protime: 10/02/2009 (dated on 09/18/2009)  Problem # 5:  HYPERLIPIDEMIA (ICD-272.4)  Her updated medication list for this problem includes:    Pravastatin Sodium 40 Mg Tabs (Pravastatin sodium) .[name]... Take 1 at bedtime  Labs Reviewed: SGOT: 26 (09/24/2009)   SGPT: 20 (09/24/2009)   HDL:48.80 (10/14/2008)  Chol:204 (10/14/2008)  Trig:121.0 (10/14/2008)  Problem # 6:  OBSTRUCTIVE SLEEP APNEA (ICD-327.23)  cont mgmt as per pulm -[facility] - last noted reviewed: the pt tells me that she is wearing cpap compliantly, but still has some periods of sleepiness while at work.  It is unclear whether this is a compliance issue, or whether it is related to her sleep hygeine.  I have stressed the importance of getting adequate quantity of sleep, and have gone over good sleep hygeine which may help.  I will get a download from her machine to look at compliance, and have also encouraged her to work aggressively on weight loss.  Complete Medication List: 1)  Coumadin 5 Mg Tabs (Warfarin sodium) .[name].. 1 by mouth daily or as directed 2)  Proair Hfa 108 (90 Base) Mcg/act Aers (Albuterol sulfate) .... Take 2 puffs q 4 hours prn 3)  Norvasc 10 Mg Tabs (Amlodipine besylate) .... Take 1 by mouth qd 4)  Coreg 25 Mg Tabs (Carvedilol) .... Take 1 two times a day 5)  Cozaar 100 Mg Tabs (Losartan potassium) .... Take 1 by mouth qd 6)  Lisinopril 20 Mg Tabs (Lisinopril) .... Hold until further notice 7)  Pravastatin Sodium 40 Mg Tabs (Pravastatin sodium) .... Take 1 at bedtime 8)  Furosemide  20 Mg Tabs (Furosemide) .... Take 1 by mouth once daily as needed for swelling 9)  Ferrex 150 150 Mg Caps (Polysaccharide iron  complex) .... Take 1 by mouth once daily 10)  Lantus 100 Unit/ml Soln (Insulin glargine) .... Take 30 units two times a day 11)  Humulin R 100 Unit/ml Soln (Insulin regular human) .... Take 5 units three times a day  Patient Instructions: 1)  it was good to see you today. 2)  hospitalization and labs reviewed - 3)  increase Lantus to 35units two times a day and continue the humulin R 5 three times a day before meals 4)  resume cozaar and coumadin now - continue to hold the lisinopril and use furosemide only as needed for swelling 5)  return this Thurs for lab only ([facility] - 585.3, 276.51) - your results will be posted on the phone tree for review in 48-72 hours from the time of test completion 6)  we'll make referral to endocrinology, dr. Loanne Drilling for diabetes review and medication adjustment. Our office will contact you regarding this appointment once made.  7)  keep appoitnment with nutritionist and educatior for diabetes as sceduled this week 8)  keep coumadin clinic appointment as scheduled 9)  Please schedule a follow-up appointment in 3 months with me, sooner if problems.

## 2010-06-15 NOTE — Assessment & Plan Note (Signed)
Summary: NEW ENDO PT PER DR VL--DIABETES--STC   Vital Signs:  Patient profile:   60 year old female Height:      62 inches (157.48 cm) Weight:      228.38 pounds (103.81 kg) O2 Sat:      95 % on Room air Temp:     99.4 degrees F (37.44 degrees C) oral Pulse rate:   90 / minute BP sitting:   120 / 78  (left arm) Cuff size:   large  Vitals Entered By: Gardenia Phlegm RMA (Oct 06, 2009 8:51 AM)  O2 Flow:  Room air CC: New Endo: Diabetes/ CF Is Patient Diabetic? Yes   Referring Provider:  Gwendolyn Grant Primary Provider:  Rowe Clack MD  CC:  New Endo: Diabetes/ CF.  History of Present Illness: pt states 17 years h/o dm.  it is complicated by renal insufficiency.  she has been on insulin x 2 weeks, after she presented with glucose of 600.  she takes lantus and regular insulin.  she brings a record of her cbg's which i have reviewed today.  it varies from 139-250.  it is in general higher as the day goes on. pt says his diet is improved, and exercise is "poor."   symptomatically, pt states few weeks of moderate headache worst at the occipital areas.  no assoc visual loss.   Current Medications (verified): 1)  Coumadin 5 Mg Tabs (Warfarin Sodium) .[name].. 1 By Mouth Daily or As Directed 2)  Proair Hfa 108 (90 Base) Mcg/act Aers (Albuterol Sulfate) .... Take 2 Puffs Q 4 Hours Prn 3)  Norvasc 10 Mg Tabs (Amlodipine Besylate) .... Take 1 By Mouth Qd 4)  Coreg 25 Mg Tabs (Carvedilol) .... Take 1 Two Times A Day 5)  Cozaar 100 Mg Tabs (Losartan Potassium) .... Take 1 By Mouth Qd 6)  Lisinopril 20 Mg Tabs (Lisinopril) .... Hold Until Further Notice 7)  Pravastatin Sodium 40 Mg Tabs (Pravastatin Sodium) .... Take 1 At Bedtime 8)  Furosemide 20 Mg Tabs (Furosemide) .... Take 1 By Mouth Once Daily As Needed For Swelling 9)  Ferrex 150 150 Mg Caps (Polysaccharide Iron Complex) .... Take 1 By Mouth Once Daily 10)  Lantus 100 Unit/ml Soln (Insulin Glargine) .... Take 30 Units Two Times A  Day 11)  Humulin R 100 Unit/ml Soln (Insulin Regular Human) .... Take 5 Units Three Times A Day 12)  T[place]ack Blood Glucose  Devi (Blood Glucose Monitoring Suppl) .... Use As Directed  Dx 250.00 13)  Truetrack Test  Strp (Glucose Blood) .... Use As Directed 14)  Lancets  Misc (Lancets) .... Use As Directed  Allergies (verified): No Known Drug Allergies  Past History:  Past Medical History: Last updated: 09/29/2009 Anemia-NOS Diabetes mellitus, type II Hyperlipidemia Hypertension Renal insufficiency - CKD 3 Pulmonary embolism 09/2008 Breast cancer, hx of OSA - CPAP    MD rooster: pulm -[facility] - [place]  Family History: Reviewed history from 09/29/2008 and no changes required. father passed unexpectedly, age. 14, cause unknown Mother passed age 71, with Parkinson's sister age 70 - CHF dm:  sister takes insulin  Social History: Reviewed history from 09/29/2009 and no changes required. lives alone divorced pt has children [employer] employee -insurance also works at [employer] nonsmoker, no alcohol  Review of Systems       denies weight loss, headache, chest pain, sob, n/v, urinary frequency, excessive diaphoresis, memory loss, depression, menopausal sxs, and rhinorrhea.  she has leg cramps and easy  bruising.   Physical Exam  General:  morbidly obese.   Head:  head: no deformity eyes: no periorbital swelling, no proptosis external nose and ears are normal mouth: no lesion seen Neck:  Supple without thyroid enlargement or tenderness.  Lungs:  Clear to auscultation bilaterally. Normal respiratory effort.  Heart:  Regular rate and rhythm without murmurs or gallops noted. Normal S1,S2.   Msk:  muscle bulk and strength are grossly normal.  no obvious joint swelling.  gait is normal and steady  Pulses:  dorsalis pedis intact bilat.  no carotid bruit  Extremities:  no deformity.  no ulcer on the feet.  feet are of normal color and temp.   1+ right pedal  edema and 2+ left pedal edema.   Neurologic:  cn 2-12 grossly intact.   readily moves all 4's.   sensation is intact to touch on the feet  Skin:  normal texture and temp.  no rash.  not diaphoretic  Cervical Nodes:  No significant adenopathy.  Psych:  Alert and cooperative; normal mood and affect; normal attention span and concentration.     Impression & Recommendations:  Problem # 1:  DIABETES MELLITUS, TYPE II (ICD-250.00) needs increased rx  Problem # 2:  headache uncertain if related to #1  Problem # 3:  CHRONIC KIDNEY DISEASE STAGE III (MODERATE) (ICD-585.3) due to #1  Medications Added to Medication List This Visit: 1)  Humalog Kwikpen 100 Unit/ml Soln (Insulin lispro (human)) .[name].. 10 units three times a day (just before each meal), and pen needles 5x a day 2)  Lantus Solostar 100 Unit/ml Soln (Insulin glargine) .... 30 units two times a day  Other Orders: Consultation Level IV [id number])  Patient Instructions: 1)  good diet and exercise habits significanly improve the control of your diabetes.  please let me know if you wish to be referred to a dietician.  high blood sugar is very risky to your health.  you should see an eye doctor every year. 2)  controlling your blood pressure and cholesterol drastically reduces the damage diabetes does to your body.  this also applies to quitting smoking.  please discuss these with your doctor.  the next time you go to coumadin clinic, ask if you should take an aspirin per day. 3)  we will need to take this complex situation in stages 4)  check your blood sugar 2-3 times a day.  vary the time of day when you check, between before the 3 meals, and at bedtime.  also check if you have symptoms of your blood sugar being too high or too low.  please keep a record of the readings and bring it to your next appointment here.  please call us sooner if you are having low blood sugar episodes. 5)  reduce lantus to 30 units two times a day 6)  change  regular to humalog 10 units three times a day (just before each meal). 7)  return in approx 3 weeks Prescriptions: LANTUS SOLOSTAR 100 UNIT/ML SOLN (INSULIN GLARGINE) 30 units two times a day  #2 boxes x 11   Entered and Authorized by:   Donavan Foil MD   Signed by:   Donavan Foil MD on 10/06/2009   Method used:   Electronically to        [facility] (retail)       [address]  [place], [place]  [address]       Ph: [phone]       Fax: [phone]   RxID:   [id number] HUMALOG KWIKPEN 100 UNIT/ML SOLN (INSULIN LISPRO (HUMAN)) 10 units three times a day (just before each meal), and pen needles 5x a day  #1 box x 11   Entered and Authorized by:   Donavan Foil MD   Signed by:   Donavan Foil MD on 10/06/2009   Method used:   Electronically to        [facility] (retail)       [address]       [place]       [place], [place]  [address]       Ph: [phone]       Fax: [phone]   RxID:   [id number]

## 2010-06-15 NOTE — Progress Notes (Signed)
Summary: Diabetic shoes  Phone Note Call from Patient Call back at Home Phone [phone]   Caller: Patient Summary of Call: Pt called requesting RX for Diabetic shoes to [facility]. Initial call taken by: Crissie Sickles, [place],  November 18, 2009 12:01 PM  Follow-up for Phone Call        ok to call this in (or fax), icd-9 250.00 - thanks Follow-up by: Rowe Clack MD,  November 18, 2009 12:37 PM  Additional Follow-up for Phone Call Additional follow up Details #1::        Rx faxed, pt informed Additional Follow-up by: Crissie Sickles, [place],  November 18, 2009 2:47 PM    New/Updated Medications: * DIABETIC SHOES 250.00 use as directed Prescriptions: DIABETIC SHOES 250.00 use as directed  #1 x 0   Entered by:   Crissie Sickles, CMA   Authorized by:   Rowe Clack MD   Signed by:   Crissie Sickles, CMA on 11/18/2009   Method used:   Faxed to ...       [facility] (retail)       [address], [place]  [id number]       Ph: [phone]       Fax: [phone]   RxID:   [id number]

## 2010-06-15 NOTE — Progress Notes (Signed)
Summary: one touch monitor  Phone Note From Pharmacy   Caller: [place]* Summary of Call: Recieved fax stating pt would like rx for One Touch strips & lancets. she is currently using the [place] but is not satisfied with this product nd would like to go back to her one touch. Initial call taken by: Tomma Lightning RMA,  February 18, 2010 11:00 AM  Follow-up for Phone Call        Sent rx for monitor and supplies. Updated EMR Follow-up by: Tomma Lightning RMA,  February 18, 2010 11:04 AM    New/Updated Medications: ONETOUCH ULTRA MINI W/DEVICE KIT (BLOOD GLUCOSE MONITORING SUPPL) use as directed ONETOUCH ULTRA BLUE  STRP (GLUCOSE BLOOD) use check blood sugar three times a day ONETOUCH LANCETS  MISC (LANCETS) use three times a day Prescriptions: ONETOUCH LANCETS  MISC (LANCETS) use three times a day  #270 x 1   Entered by:   Tomma Lightning RMA   Authorized by:   Rowe Clack MD   Signed by:   Tomma Lightning RMA on 02/18/2010   Method used:   Electronically to        [facility] (retail)       [address]       [place], [place]  [address]       Ph: [phone]       Fax: [phone]   RxID:   [id number] ONETOUCH ULTRA BLUE  STRP (GLUCOSE BLOOD) use check blood sugar three times a day  #270 x 1   Entered by:   Tomma Lightning RMA   Authorized by:   Rowe Clack MD   Signed by:   Tomma Lightning RMA on 02/18/2010   Method used:   Electronically to        [facility] (retail)       [address]       [place], [place]  [address]       Ph: [phone]       Fax: [phone]   RxID:   [id number] ONETOUCH ULTRA MINI W/DEVICE KIT (BLOOD GLUCOSE MONITORING SUPPL) use as directed  #1 x 0   Entered by:   Tomma Lightning RMA   Authorized by:   Rowe Clack MD   Signed by:   Tomma Lightning RMA on 02/18/2010   Method used:   Electronically to        [facility] (retail)       [address]       [place], [place]  [address]       Ph: [phone]       Fax: [phone]   RxID:   [id number]

## 2010-06-15 NOTE — Progress Notes (Signed)
Summary: triage  Phone Note Call from Patient Call back at Work Phone [phone]   Caller: Patient Summary of Call: Pt says she is returning Sally's call from today. Initial call taken by: Raymond Gurney,  October 19, 2009 2:21 PM  Follow-up for Phone Call        she had been referred to Dr. Jenne Campus. appt made for 1:30 on the 30th of this month Follow-up by: Elige Radon RN,  October 19, 2009 3:04 PM

## 2010-06-15 NOTE — Consult Note (Signed)
Summary: [facility]   Imported By: Phillis Knack 03/24/2010 15:11:08  _____________________________________________________________________  External Attachment:    Type:   Image     Comment:   External Document

## 2010-06-15 NOTE — Medication Information (Signed)
Summary: rov/ez  Anticoagulant Therapy  Managed by: Freddrick March, RN, BSN PCP: Rowe Clack MD Supervising MD: Harrington Challenger MD, Nevin Bloodgood Indication 1: Pulmonary Embolism and Infarction (ICD-415.1) Indication 2: Deep Vein Thrombosis - Leg (ICD-451.1) Lab Used: [facility] Site: [facility] INR POC 2.8 INR RANGE 2 - 3  Dietary changes: no    Health status changes: no    Bleeding/hemorrhagic complications: no    Recent/future hospitalizations: no    Any changes in medication regimen? no    Recent/future dental: no  Any missed doses?: no       Is patient compliant with meds? yes      Comments: Wants to lose weight, wants to start on Bee Pollen supplement to help. No human drug interaction documented.  Message relayed to pt while at Dr Hamilton Capri office by her nurse.    Allergies (verified): No Known Drug Allergies  Anticoagulation Management History:      The patient is taking warfarin and comes in today for a routine follow up visit.  Positive risk factors for bleeding include presence of serious comorbidities.  Negative risk factors for bleeding include an age less than 22 years old.  The bleeding index is 'intermediate risk'.  Positive CHADS2 values include History of HTN and History of Diabetes.  Negative CHADS2 values include Age > 28 years old.  The start date was 10/02/2008.  Her last INR was 4.7 ratio.  Anticoagulation responsible provider: Harrington Challenger MD, Nevin Bloodgood.  INR POC: 2.8.  Cuvette Lot#: [id number].  Exp: 08/2010.    Anticoagulation Management Assessment/Plan:      The patient's current anticoagulation dose is Coumadin 5 mg tabs: 1 by mouth daily or as directed.  The target INR is 2.0-3.0.  The next INR is due 07/24/2009.  Anticoagulation instructions were given to patient.  Results were reviewed/authorized by Freddrick March, RN, BSN.  She was notified by Freddrick March RN.         Prior Anticoagulation Instructions: INR: 3.4 Skip Saturday's dose then resume to same dosage of 5mg   tablet daily except 2.5mg  on Mondays Recheck in 3 to 4 weeks   Current Anticoagulation Instructions: INR 2.8  Continue on same dosage 1 tablet daily except 1/2 tablet on Mondays.  Recheck in 4 weeks.

## 2010-06-15 NOTE — Letter (Signed)
Summary: [place]   Imported By: Phillis Knack 10/28/2009 11:52:00  _____________________________________________________________________  External Attachment:    Type:   Image     Comment:   External Document

## 2010-06-15 NOTE — Medication Information (Signed)
Summary: rov/sp  Anticoagulant Therapy  Managed by: Freddrick March, RN, BSN PCP: Rowe Clack MD Supervising MD: Johnsie Cancel MD, Collier Salina Indication 1: Pulmonary Embolism and Infarction (ICD-415.1) Indication 2: Deep Vein Thrombosis - Leg (ICD-451.1) Lab Used: [facility] Site: [facility] INR POC 1.5 INR RANGE 2 - 3  Dietary changes: no    Health status changes: no    Bleeding/hemorrhagic complications: no    Recent/future hospitalizations: no    Any changes in medication regimen? no    Recent/future dental: no  Any missed doses?: no       Is patient compliant with meds? yes       Allergies: No Known Drug Allergies  Anticoagulation Management History:      The patient is taking warfarin and comes in today for a routine follow up visit.  Positive risk factors for bleeding include presence of serious comorbidities.  Negative risk factors for bleeding include an age less than 71 years old.  The bleeding index is 'intermediate risk'.  Positive CHADS2 values include History of HTN and History of Diabetes.  Negative CHADS2 values include Age > 27 years old.  The start date was 10/02/2008.  Her last INR was 4.7 ratio.  Anticoagulation responsible provider: Johnsie Cancel MD, Collier Salina.  INR POC: 1.5.  Cuvette Lot#: [id number].  Exp: 12/2010.    Anticoagulation Management Assessment/Plan:      The patient's current anticoagulation dose is Coumadin 5 mg tabs: 1 by mouth daily or as directed.  The target INR is 2.0-3.0.  The next INR is due 10/30/2009.  Anticoagulation instructions were given to patient.  Results were reviewed/authorized by Freddrick March, RN, BSN.  She was notified by Freddrick March RN.         Prior Anticoagulation Instructions: INR 1.6  Take 1 tablet today and tomorrow then resume same dose of 1/2 tablet every day except 1 tablet on Sunday, Tuesday and Thursday   Current Anticoagulation Instructions: INR 1.5  Take 1 tablet today, then start taking 1 tablet daily except 1/2  tablet on Mondays and Fridays.  Recheck in 2 weeks.

## 2010-06-15 NOTE — Assessment & Plan Note (Signed)
Summary: 3 MTH FU---STC   Vital Signs:  Patient profile:   60 year old female Height:      62 inches (157.48 cm) Weight:      230.8 pounds (104.91 kg) O2 Sat:      98 % on Room air Temp:     98.6 degrees F (37.00 degrees C) oral Pulse rate:   87 / minute BP sitting:   120 / 72  (left arm) Cuff size:   large  Vitals Entered By: Tomma Lightning RMA (December 29, 2009 9:11 AM)  O2 Flow:  Room air CC: 3 MONTH FOLLOW-UP Is Patient Diabetic? Yes Did you bring your meter with you today? No Pain Assessment Patient in pain? no        Primary Care Provider:  Rowe Clack MD  CC:  3 MONTH FOLLOW-UP.  History of Present Illness: here for f/u  1) DM2 - as above  [facility] 09/2009 - prev diet controlled - prior a1c reviewed 6.2 in 6/10, 6.2 in 2/11 - now on insulin - home log reviewed - checking cbgs 3-4x/d - no hypoglycemic events or symptoms - continued PU/PD but less dramatic than before hosp - planning endo eval 02/2010  2) HTN - no CP or HA, no vision change or lightheaded symptoms - reports compliance with ongoing medical treatment and no changes in medication dose or frequency. denies adverse side effects related to current therapy.   3) CKD - worried that renal fx was worse in hosp than it had been - edema is better than usual - no confusion or twitching - now seen by renal since last OV here  4) PE hx 09/2008 - on anticoag; - no CP or SOB, no leg swelling - denies weight loss -  still feels she needs hanicap parking tag due to long distance to walk uphill at work and pain in legs with this swelling   Clinical Review Panels:  Prevention   Last Mammogram:  ASSESSMENT: Negative - BI-RADS 1^MM DIGITAL SCREENING UNILAT L (07/07/2009)  Immunizations   Last Flu Vaccine:  Historical (02/13/2009)   Last Pneumovax:  Pneumovax (06/26/2009)  Lipid Management   Cholesterol:  204 (10/14/2008)   HDL (good cholesterol):  48.80 (10/14/2008)  Diabetes Management   HgBA1C:  12.5  (09/24/2009)   Creatinine:  1.6 (10/02/2009)   Last Dilated Eye Exam:  normal, addtional findings Cataracts (11/04/2009)   Last Foot Exam:  yes (09/29/2008)   Last Flu Vaccine:  Historical (02/13/2009)   Last Pneumovax:  Pneumovax (06/26/2009)  CBC   WBC:  4.3 (10/14/2008)   RBC:  3.64 (10/14/2008)   Hgb:  11.2 (10/14/2008)   Hct:  32.6 (10/14/2008)   Platelets:  239.0 (10/14/2008)   MCV  89.6 (10/14/2008)   MCHC  34.4 (10/14/2008)   RDW  13.2 (10/14/2008)   PMN:  48.6 (10/14/2008)   Lymphs:  39.8 (10/14/2008)   Monos:  7.5 (10/14/2008)   Eosinophils:  3.2 (10/14/2008)   Basophil:  0.9 (10/14/2008)  Complete Metabolic Panel   Glucose:  129 (10/02/2009)   Sodium:  143 (10/02/2009)   Potassium:  5.2 (10/02/2009)   Chloride:  109 (10/02/2009)   CO2:  30 (10/02/2009)   BUN:  28 (10/02/2009)   Creatinine:  1.6 (10/02/2009)   Albumin:  3.2 (09/24/2009)   Total Protein:  6.3 (09/24/2009)   Calcium:  9.3 (10/02/2009)   Total Bili:  0.5 (09/24/2009)   Alk Phos:  91 (09/24/2009)   SGPT (ALT):  20 (  09/24/2009)   SGOT (AST):  26 (09/24/2009)   Current Medications (verified): 1)  Coumadin 5 Mg Tabs (Warfarin Sodium) .[name].. 1 By Mouth Daily or As Directed 2)  Proair Hfa 108 (90 Base) Mcg/act Aers (Albuterol Sulfate) .... Take 2 Puffs Q 4 Hours Prn 3)  Norvasc 10 Mg Tabs (Amlodipine Besylate) .... Take 1 By Mouth Qd 4)  Coreg 25 Mg Tabs (Carvedilol) .... Take 1 Two Times A Day 5)  Cozaar 100 Mg Tabs (Losartan Potassium) .... Take 1 By Mouth Qd 6)  Lisinopril 20 Mg Tabs (Lisinopril) .... Hold Until Further Notice 7)  Pravastatin Sodium 40 Mg Tabs (Pravastatin Sodium) .... Take 1 At Bedtime 8)  Furosemide 20 Mg Tabs (Furosemide) .... Take 1 By Mouth Once Daily As Needed For Swelling 9)  Ferrex 150 150 Mg Caps (Polysaccharide Iron Complex) .... Take 1 By Mouth Once Daily 10)  Truetrack Blood Glucose  Devi (Blood Glucose Monitoring Suppl) .... Use As Directed  Dx 250.00 11)  Truetrack  Test  Strp (Glucose Blood) .... Use As Directed 12)  Lancets  Misc (Lancets) .... Use As Directed 13)  Humalog Kwikpen 100 Unit/ml Soln (Insulin Lispro (Human)) .[name].. 10 Units Three Times A Day (Just Before Each Meal), and Pen Needles 5x A Day 14)  Lantus Solostar 100 Unit/ml Soln (Insulin Glargine) .... 30 Units Two Times A Day 15)  Diabetic Shoes 250.00 .... Use As Directed  Allergies (verified): No Known Drug Allergies  Past History:  Past Medical History: Anemia-NOS Diabetes mellitus, type II Hyperlipidemia Hypertension Renal insufficiency - CKD 3 Pulmonary embolism 09/2008 Breast cancer, hx of OSA - CPAP    MD roster: pulm -[facility] - [facility] - ([place])  Review of Systems  The patient denies anorexia, fever, chest pain, dyspnea on exertion, peripheral edema, and headaches.    Physical Exam  General:  obese, alert, well-developed, well-nourished, and cooperative to examination.    Lungs:  normal respiratory effort, no intercostal retractions or use of accessory muscles; normal breath sounds bilaterally - no crackles and no wheezes.    Heart:  normal rate, regular rhythm, no murmur, and no rub. BLE with improvement in mild chronic edema L>R.  Psych:  Oriented X3, memory intact for recent and remote, normally interactive, good eye contact, not anxious appearing, not depressed appearing, and not agitated.      Impression & Recommendations:  Problem # 1:  DIABETES MELLITUS, TYPE II (ICD-250.00)  will add additional meal coverage as needed for cbg >150 premeal Her updated medication list for this problem includes:    Cozaar 100 Mg Tabs (Losartan potassium) .[name]... Take 1 by mouth qd    Lisinopril 20 Mg Tabs (Lisinopril) ..... Hold until further notice    Humalog Kwikpen 100 Unit/ml Soln (Insulin lispro (human)) .Marland KitchenMarland KitchenMarland KitchenMarland Kitchen 10 units three times a day (just before each meal) + 3 units if cbg >150 (or as directed), and pen needles 5x a day    Lantus Solostar 100 Unit/ml  Soln (Insulin glargine) .Marland KitchenMarland KitchenMarland KitchenMarland Kitchen 30 units two times a day  Labs Reviewed: Creat: 1.6 (10/02/2009)     Last Eye Exam: normal, addtional findings Cataracts (11/04/2009) Reviewed HgBA1c results: 12.5 (09/24/2009)  12.2 (09/23/2009)  Orders: TLB-A1C / Hgb A1C (Glycohemoglobin) (83036-A1C)  Problem # 2:  HYPERTENSION (ICD-401.9)  Her updated medication list for this problem includes:    Norvasc 10 Mg Tabs (Amlodipine besylate) .[name]... Take 1 by mouth qd    Coreg 25 Mg Tabs (Carvedilol) .[name]... Take 1 two times  a day    Cozaar 100 Mg Tabs (Losartan potassium) .[name]... Take 1 by mouth qd    Lisinopril 20 Mg Tabs (Lisinopril) ..... Hold until further notice    Furosemide 20 Mg Tabs (Furosemide) .[name]... Take 1 by mouth once daily as needed for swelling  BP today: 120/72 Prior BP: 120/78 (10/06/2009)  Labs Reviewed: K+: 5.2 (10/02/2009) Creat: : 1.6 (10/02/2009)   Chol: 204 (10/14/2008)   HDL: 48.80 (10/14/2008)   TG: 121.0 (10/14/2008)  Problem # 3:  CHRONIC KIDNEY DISEASE STAGE III (MODERATE) (ICD-585.3)  renal note 07/2009 reviewed - also hosp reviewed exac by dehydration with [facility] - dc Cr 2.2, baseline 1.6 cont holding scheduled furosemide  and ACEI but resume ARB - recheck labs this week to monitor same  Labs Reviewed: BUN: 27 (09/24/2009)   Cr: 2.12 (09/24/2009)    Hgb: 11.2 (10/14/2008)   Hct: 32.6 (10/14/2008)   Ca++: 9.0 (09/24/2009)    TP: 6.3 (09/24/2009)   Alb: 3.2 (09/24/2009)  Problem # 4:  HYPERLIPIDEMIA (ICD-272.4)  Her updated medication list for this problem includes:    Pravastatin Sodium 40 Mg Tabs (Pravastatin sodium) .[name]... Take 1 at bedtime  Labs Reviewed: SGOT: 26 (09/24/2009)   SGPT: 20 (09/24/2009)   HDL:48.80 (10/14/2008)  Chol:204 (10/14/2008)  Trig:121.0 (10/14/2008)  Orders: TLB-Lipid Panel (80061-LIPID)  Problem # 5:  PULMONARY EMBOLISM (ICD-415.19)  Her updated medication list for this problem includes:    Coumadin 5 Mg Tabs (Warfarin sodium) .[name]...  1 by mouth daily or as directed  tx x >12 mo ongoing for hx same summer 2010 - mgmt per [facility] -   Reviewed the following: PT: 20.5 (12/05/2008)   INR: 4.7 ratio (10/07/2008)    Coumadin Dose (weekly): 32.50 mg (12/03/2009) Prior Coumadin Dose (weekly): 32.50 mg (12/03/2009) Next Protime: 12/31/2009 (dated on 12/03/2009)  Complete Medication List: 1)  Coumadin 5 Mg Tabs (Warfarin sodium) .[name].. 1 by mouth daily or as directed 2)  Proair Hfa 108 (90 Base) Mcg/act Aers (Albuterol sulfate) .... Take 2 puffs q 4 hours prn 3)  Norvasc 10 Mg Tabs (Amlodipine besylate) .... Take 1 by mouth qd 4)  Coreg 25 Mg Tabs (Carvedilol) .... Take 1 two times a day 5)  Cozaar 100 Mg Tabs (Losartan potassium) .... Take 1 by mouth qd 6)  Lisinopril 20 Mg Tabs (Lisinopril) .... Hold until further notice 7)  Pravastatin Sodium 40 Mg Tabs (Pravastatin sodium) .... Take 1 at bedtime 8)  Furosemide 20 Mg Tabs (Furosemide) .... Take 1 by mouth once daily as needed for swelling 9)  Ferrex 150 150 Mg Caps (Polysaccharide iron complex) .... Take 1 by mouth once daily 10)  Truetrack Blood Glucose Devi (Blood glucose monitoring suppl) .... Use as directed  dx 250.00 11)  Truetrack Test Strp (Glucose blood) .... Use as directed 12)  Lancets Misc (Lancets) .... Use as directed 13)  Humalog Kwikpen 100 Unit/ml Soln (Insulin lispro (human)) .[name].. 10 units three times a day (just before each meal) + 3 units if cbg >150 (or as directed), and pen needles 5x a day 14)  Lantus Solostar 100 Unit/ml Soln (Insulin glargine) .... 30 units two times a day 15)  Diabetic Shoes 250.00  .... Use as directed  Patient Instructions: 1)  it was good to see you today. 2)  test(s) ordered today - your results will be posted on the phone tree for review in 48-72 hours from the time of test completion; call [phone] and enter your  9 digit MRN (listed above on this page, just below your name); if any changes need to be made or there are  abnormal results, you will be contacted directly. 3)  increase meal coverage insulin as discussed for cbg >150 -  4)  no other medication changes for now 5)  temp handicap card provided - don't gain weight and don't get lazy! 6)  continue to keep followup as planned with dr. Chalmers Cater and Clover Mealy 7)  Please schedule a follow-up appointment in 3-3 months, sooner if problems.

## 2010-06-15 NOTE — Medication Information (Signed)
Summary: rov/eac  Anticoagulant Therapy  Managed by: Tula Nakayama, RN, BSN PCP: Rowe Clack MD Supervising MD: Burt Knack MD, Legrand Como Indication 1: Pulmonary Embolism and Infarction (ICD-415.1) Indication 2: Deep Vein Thrombosis - Leg (ICD-451.1) Lab Used: [place] Site: [facility] INR POC 4.2 INR RANGE 2 - 3  Dietary changes: yes       Details: Eating less green leafy veggies  Health status changes: no    Bleeding/hemorrhagic complications: no    Recent/future hospitalizations: no    Any changes in medication regimen? no    Recent/future dental: no  Any missed doses?: no       Is patient compliant with meds? yes       Allergies: No Known Drug Allergies  Anticoagulation Management History:      The patient is taking warfarin and comes in today for a routine follow up visit.  Positive risk factors for bleeding include presence of serious comorbidities.  Negative risk factors for bleeding include an age less than 74 years old.  The bleeding index is 'intermediate risk'.  Positive CHADS2 values include History of HTN and History of Diabetes.  Negative CHADS2 values include Age > 57 years old.  The start date was 10/02/2008.  Her last INR was 4.7 ratio.  Anticoagulation responsible provider: Burt Knack MD, Legrand Como.  INR POC: 4.2.  Cuvette Lot#: [id number].  Exp: 09/2010.    Anticoagulation Management Assessment/Plan:      The patient's current anticoagulation dose is Coumadin 5 mg tabs: 1 by mouth daily or as directed.  The target INR is 2.0-3.0.  The next INR is due 09/18/2009.  Anticoagulation instructions were given to patient.  Results were reviewed/authorized by Tula Nakayama, RN, BSN.  She was notified by Tula Nakayama, RN, BSN.         Prior Anticoagulation Instructions: INR 3.4  Do NOT take coumadin tomorrow (Wednesday).  Then return to normal dosing schedule of 1/2 tablet on Monday and 1 tablet all other days. Return to clinic in 4 weeks.    Current Anticoagulation  Instructions: INR 4.2 Skip Wednesday's dose then change dose to 5mg s everyday except 2.5mg s on Mondays and Fridays. Recheck in 2 weeks.

## 2010-06-15 NOTE — Medication Information (Signed)
Summary: rov/jk  Anticoagulant Therapy  Managed by: Freddrick March, RN, BSN PCP: Rowe Clack MD Supervising MD: Haroldine Laws MD, Quillian Quince Indication 1: Pulmonary Embolism and Infarction (ICD-415.1) Indication 2: Deep Vein Thrombosis - Leg (ICD-451.1) Lab Used: [place] Site: [facility] INR POC 2.0 INR RANGE 2 - 3  Dietary changes: no    Health status changes: no    Bleeding/hemorrhagic complications: no    Recent/future hospitalizations: no    Any changes in medication regimen? no    Recent/future dental: no  Any missed doses?: no       Is patient compliant with meds? yes       Allergies: No Known Drug Allergies  Anticoagulation Management History:      The patient is taking warfarin and comes in today for a routine follow up visit.  Positive risk factors for bleeding include presence of serious comorbidities.  Negative risk factors for bleeding include an age less than 56 years old.  The bleeding index is 'intermediate risk'.  Positive CHADS2 values include History of HTN and History of Diabetes.  Negative CHADS2 values include Age > 18 years old.  The start date was 10/02/2008.  Her last INR was 4.7 ratio.  Anticoagulation responsible provider: Glenna Brunkow MD, Quillian Quince.  INR POC: 2.0.  Cuvette Lot#: [id number].  Exp: 02/2011.    Anticoagulation Management Assessment/Plan:      The patient's current anticoagulation dose is Coumadin 5 mg tabs: 1 by mouth daily or as directed.  The target INR is 2.0-3.0.  The next INR is due 02/25/2010.  Anticoagulation instructions were given to patient.  Results were reviewed/authorized by Freddrick March, RN, BSN.  She was notified by Freddrick March RN.         Prior Anticoagulation Instructions: INR 2.4  Continue taking 1 tablet (5mg ) every day except take 1/2 tablet (2.5mg ) on Mondays.  Recheck in 4 weeks.   Current Anticoagulation Instructions: INR 2.0  Take 1.5 tablets today, then resume same dosage 1 tablet daily except 1/2 tablet  on Mondays.  Recheck in 4 weeks.

## 2010-06-15 NOTE — Progress Notes (Signed)
Summary: CPAP issue  Phone Note Call from Patient Call back at Work Phone [phone]   Caller: Patient Call For: Eloni Darius Reason for Call: Talk to Nurse Summary of Call: pt is still having trouble w/cpap mask.  Need an order for them to repair - just go a new one.  Need RX. Initial call taken by: Zigmund Gottron,  July 01, 2009 9:56 AM  Follow-up for Phone Call        The patient says she is having leakage around her CPAP mask and it doesn't seem to fit right on her nose. She is worried that when compliance download is done the data will be wrong due to this issue. Please advise.Francesca Jewett [facility]  July 01, 2009 10:18 AM  Additional Follow-up for Phone Call Additional follow up Details #1::        let her know that mask leak will not affect her report.  Will send an order to [facility] for a mask fitting with dme. Additional Follow-up by: Kathee Delton MD,  July 01, 2009 5:14 PM    Additional Follow-up for Phone Call Additional follow up Details #2::    pt advised.Monument Bing CMA  July 01, 2009 5:17 PM

## 2010-06-15 NOTE — Letter (Signed)
Summary: [facility]   Imported By: Phillis Knack 02/26/2010 09:26:01  _____________________________________________________________________  External Attachment:    Type:   Image     Comment:   External Document

## 2010-06-15 NOTE — Medication Information (Signed)
Summary: ccr/jss  Anticoagulant Therapy  Managed by: Margaretha Sheffield, PharmD PCP: Rowe Clack MD Supervising MD: Harrington Challenger MD, Nevin Bloodgood Indication 1: Pulmonary Embolism and Infarction (ICD-415.1) Indication 2: Deep Vein Thrombosis - Leg (ICD-451.1) Lab Used: [place] Site: [facility] INR POC 3.4 INR RANGE 2 - 3  Dietary changes: yes       Details: couple glasses of wine  Health status changes: no    Bleeding/hemorrhagic complications: no    Recent/future hospitalizations: no    Any changes in medication regimen? no    Recent/future dental: no  Any missed doses?: no       Is patient compliant with meds? yes       Allergies: No Known Drug Allergies  Anticoagulation Management History:      The patient is taking warfarin and comes in today for a routine follow up visit.  Positive risk factors for bleeding include presence of serious comorbidities.  Negative risk factors for bleeding include an age less than 34 years old.  The bleeding index is 'intermediate risk'.  Positive CHADS2 values include History of HTN and History of Diabetes.  Negative CHADS2 values include Age > 28 years old.  The start date was 10/02/2008.  Her last INR was 4.7 ratio.  Anticoagulation responsible provider: Harrington Challenger MD, Nevin Bloodgood.  INR POC: 3.4.  Cuvette Lot#: [id number].  Exp: 09/2010.    Anticoagulation Management Assessment/Plan:      The patient's current anticoagulation dose is Coumadin 5 mg tabs: 1 by mouth daily or as directed.  The target INR is 2.0-3.0.  The next INR is due 08/25/2009.  Anticoagulation instructions were given to patient.  Results were reviewed/authorized by Margaretha Sheffield, PharmD.  She was notified by Margaretha Sheffield.         Prior Anticoagulation Instructions: INR 2.8  Continue on same dosage 1 tablet daily except 1/2 tablet on Mondays.  Recheck in 4 weeks.    Current Anticoagulation Instructions: INR 3.4  Do NOT take coumadin tomorrow (Wednesday).  Then return to normal  dosing schedule of 1/2 tablet on Monday and 1 tablet all other days. Return to clinic in 4 weeks.

## 2010-06-15 NOTE — Medication Information (Signed)
Summary: [facility]  Anticoagulant Therapy  Managed by: Inactive PCP: Rowe Clack MD Supervising MD: Johnsie Cancel MD, Collier Salina Indication 1: Pulmonary Embolism and Infarction (ICD-415.1) Indication 2: Deep Vein Thrombosis - Leg (ICD-451.1) Lab Used: [facility] Site: [facility] INR RANGE 2 - 3          Comments: Coumadin stopped by Dr. Asa Lente.  Pt has completed >18 months therapy for PE/DVT  Allergies: No Known Drug Allergies  Anticoagulation Management History:      Positive risk factors for bleeding include presence of serious comorbidities.  Negative risk factors for bleeding include an age less than 92 years old.  The bleeding index is 'intermediate risk'.  Positive CHADS2 values include History of HTN and History of Diabetes.  Negative CHADS2 values include Age > 23 years old.  The start date was 10/02/2008.  Her last INR was 4.7 ratio.  Anticoagulation responsible provider: Johnsie Cancel MD, Collier Salina.  Exp: 04/16/2011.    Anticoagulation Management Assessment/Plan:      The target INR is 2.0-3.0.  The next INR is due 04/27/2010.  Anticoagulation instructions were given to patient.  Results were reviewed/authorized by Inactive.         Prior Anticoagulation Instructions: INR 2.4 Continue taking a half tablet on monday. And 1 tablet all other days. Recheck in 4 weeks.

## 2010-06-15 NOTE — Miscellaneous (Signed)
Summary: appt with Dr. Jenne Campus  Clinical Lists Changes LM for her to call back. needs appt with Dr Jenne Campus per [place]. next available is 11/12/09. one hour appt..form to Dr.Randie Bloodgood. Elige Radon RN  October 15, 2009 2:58 PM

## 2010-06-15 NOTE — Medication Information (Signed)
Summary: rov/cs  Anticoagulant Therapy  Managed by: Mammie Lorenzo, PharmD PCP: Rowe Clack MD Supervising MD: Johnsie Cancel MD, Collier Salina Indication 1: Pulmonary Embolism and Infarction (ICD-415.1) Indication 2: Deep Vein Thrombosis - Leg (ICD-451.1) Lab Used: [place] Site: [facility] INR POC 2.4 INR RANGE 2 - 3  Dietary changes: no    Health status changes: no    Bleeding/hemorrhagic complications: no    Recent/future hospitalizations: no    Any changes in medication regimen? no    Recent/future dental: no  Any missed doses?: no       Is patient compliant with meds? yes       Allergies: No Known Drug Allergies  Anticoagulation Management History:      The patient is taking warfarin and comes in today for a routine follow up visit.  Positive risk factors for bleeding include presence of serious comorbidities.  Negative risk factors for bleeding include an age less than 66 years old.  The bleeding index is 'intermediate risk'.  Positive CHADS2 values include History of HTN and History of Diabetes.  Negative CHADS2 values include Age > 67 years old.  The start date was 10/02/2008.  Her last INR was 4.7 ratio.  Anticoagulation responsible provider: Johnsie Cancel MD, Collier Salina.  INR POC: 2.4.  Cuvette Lot#: [id number].  Exp: 04/16/2011.    Anticoagulation Management Assessment/Plan:      The patient's current anticoagulation dose is Coumadin 5 mg tabs: 1 by mouth daily or as directed.  The target INR is 2.0-3.0.  The next INR is due 04/27/2010.  Anticoagulation instructions were given to patient.  Results were reviewed/authorized by Mammie Lorenzo, PharmD.         Prior Anticoagulation Instructions: INR 2.3  Continue Coumadin as scheduled:  1 tablet every day of the week, except 1/2 tablet on Monday.  Return to clinic in 4 weeks.    Current Anticoagulation Instructions: INR 2.4 Continue taking a half tablet on monday. And 1 tablet all other days. Recheck in 4 weeks.

## 2010-06-15 NOTE — Medication Information (Signed)
Summary: Patricia Morgan  Anticoagulant Therapy  Managed by: Freddrick March, RN, BSN PCP: Rowe Clack MD Supervising MD: Stanford Breed MD, Aaron Edelman Indication 1: Pulmonary Embolism and Infarction (ICD-415.1) Indication 2: Deep Vein Thrombosis - Leg (ICD-451.1) Lab Used: [facility] Site: [facility] INR POC 2.6 INR RANGE 2 - 3  Dietary changes: yes       Details: Decr vit K this week.  Health status changes: no    Bleeding/hemorrhagic complications: no    Recent/future hospitalizations: no    Any changes in medication regimen? no    Recent/future dental: no  Any missed doses?: no       Is patient compliant with meds? yes       Allergies: No Known Drug Allergies  Anticoagulation Management History:      The patient is taking warfarin and comes in today for a routine follow up visit.  Positive risk factors for bleeding include presence of serious comorbidities.  Negative risk factors for bleeding include an age less than 62 years old.  The bleeding index is 'intermediate risk'.  Positive CHADS2 values include History of HTN and History of Diabetes.  Negative CHADS2 values include Age > 60 years old.  The start date was 10/02/2008.  Her last INR was 4.7 ratio.  Anticoagulation responsible provider: Stanford Breed MD, Aaron Edelman.  INR POC: 2.6.  Cuvette Lot#: [id number].  Exp: 02/2011.    Anticoagulation Management Assessment/Plan:      The patient's current anticoagulation dose is Coumadin 5 mg tabs: 1 by mouth daily or as directed.  The target INR is 2.0-3.0.  The next INR is due 12/31/2009.  Anticoagulation instructions were given to patient.  Results were reviewed/authorized by Freddrick March, RN, BSN.  She was notified by Freddrick March RN.         Prior Anticoagulation Instructions: INR 2.1  Continue on same dosage 1 tablet daily except 1/2 tablet on Mondays.  Recheck in 3 weeks.    Current Anticoagulation Instructions: INR 2.6  Continue on same dosage 1 tablet daily except 1/2 tablet on  Mondays.  Recheck in 4 weeks.

## 2010-06-15 NOTE — Medication Information (Signed)
Summary: rov/cb  Anticoagulant Therapy  Managed by: Freddrick March, RN, BSN PCP: Rowe Clack MD Supervising MD: Stanford Breed MD, Aaron Edelman Indication 1: Pulmonary Embolism and Infarction (ICD-415.1) Indication 2: Deep Vein Thrombosis - Leg (ICD-451.1) Lab Used: [facility] Site: [facility] INR POC 2.1 INR RANGE 2 - 3  Dietary changes: no    Health status changes: no    Bleeding/hemorrhagic complications: no    Recent/future hospitalizations: no    Any changes in medication regimen? no    Recent/future dental: no  Any missed doses?: no       Is patient compliant with meds? yes       Allergies: No Known Drug Allergies  Anticoagulation Management History:      The patient is taking warfarin and comes in today for a routine follow up visit.  Positive risk factors for bleeding include presence of serious comorbidities.  Negative risk factors for bleeding include an age less than 47 years old.  The bleeding index is 'intermediate risk'.  Positive CHADS2 values include History of HTN and History of Diabetes.  Negative CHADS2 values include Age > 37 years old.  The start date was 10/02/2008.  Her last INR was 4.7 ratio.  Anticoagulation responsible provider: Stanford Breed MD, Aaron Edelman.  INR POC: 2.1.  Cuvette Lot#: [id number].  Exp: 01/2011.    Anticoagulation Management Assessment/Plan:      The patient's current anticoagulation dose is Coumadin 5 mg tabs: 1 by mouth daily or as directed.  The target INR is 2.0-3.0.  The next INR is due 12/03/2009.  Anticoagulation instructions were given to patient.  Results were reviewed/authorized by Freddrick March, RN, BSN.  She was notified by Freddrick March RN.         Prior Anticoagulation Instructions: INR 1.7. Take 1 tablet today, then take 1 tablet daily except 0.5 tablet on Mondays. Recheck in 2 weeks.  Current Anticoagulation Instructions: INR 2.1  Continue on same dosage 1 tablet daily except 1/2 tablet on Mondays.  Recheck in 3 weeks.

## 2010-06-15 NOTE — Letter (Signed)
Summary: [facility]  [facility]   Imported By: Phillis Knack 11/20/2009 10:39:32  _____________________________________________________________________  External Attachment:    Type:   Image     Comment:   External Document

## 2010-06-15 NOTE — Medication Information (Signed)
Summary: rov/tm  Anticoagulant Therapy  Managed by: Porfirio Oar, PharmD PCP: Rowe Clack MD Supervising MD: Harrington Challenger MD, Nevin Bloodgood Indication 1: Pulmonary Embolism and Infarction (ICD-415.1) Indication 2: Deep Vein Thrombosis - Leg (ICD-451.1) Lab Used: [facility] Site: [facility] INR POC 4.3 INR RANGE 2 - 3  Dietary changes: no    Health status changes: no    Bleeding/hemorrhagic complications: no    Recent/future hospitalizations: no    Any changes in medication regimen? no    Recent/future dental: no  Any missed doses?: no       Is patient compliant with meds? yes       Allergies: No Known Drug Allergies  Anticoagulation Management History:      The patient is taking warfarin and comes in today for a routine follow up visit.  Positive risk factors for bleeding include presence of serious comorbidities.  Negative risk factors for bleeding include an age less than 9 years old.  The bleeding index is 'intermediate risk'.  Positive CHADS2 values include History of HTN and History of Diabetes.  Negative CHADS2 values include Age > 88 years old.  The start date was 10/02/2008.  Her last INR was 4.7 ratio.  Anticoagulation responsible provider: Harrington Challenger MD, Nevin Bloodgood.  INR POC: 4.3.  Cuvette Lot#: [id number].  Exp: 10/2010.    Anticoagulation Management Assessment/Plan:      The patient's current anticoagulation dose is Coumadin 5 mg tabs: 1 by mouth daily or as directed.  The target INR is 2.0-3.0.  The next INR is due 10/02/2009.  Anticoagulation instructions were given to patient.  Results were reviewed/authorized by Porfirio Oar, PharmD.  She was notified by Porfirio Oar PharmD.         Prior Anticoagulation Instructions: INR 4.2 Skip Wednesday's dose then change dose to 5mg s everyday except 2.5mg s on Mondays and Fridays. Recheck in 2 weeks.   Current Anticoagulation Instructions: INR 4.3  Skip today's dose of Coumadin then decrease dose to 1/2 tablet every day except 1 tablet on  Sunday, Tuesday and Thursday

## 2010-06-15 NOTE — Progress Notes (Signed)
Summary: DM shoes  Phone Note Call from Patient Call back at Home Phone [phone]   Caller: Patient Summary of Call: Pt called requesting new Rx for DM shoes faxed to Dr Paulla Dolly at [facility] [phone]. Okay to Rx and fax? Initial call taken by: Crissie Sickles, CMA,  February 03, 2010 11:49 AM  Follow-up for Phone Call        yes - thx Follow-up by: Rowe Clack MD,  February 03, 2010 12:05 PM  Additional Follow-up for Phone Call Additional follow up Details #1::        DME printed and faxed to requested number Dr Paulla Dolly. Additional Follow-up by: Crissie Sickles, CMA,  February 03, 2010 1:26 PM

## 2010-06-15 NOTE — Medication Information (Signed)
Summary: rov/ewj  Anticoagulant Therapy  Managed by: Alinda Deem, PharmD, BCPS, CPP PCP: Rowe Clack MD Supervising MD: Haroldine Laws MD, Quillian Quince Indication 1: Pulmonary Embolism and Infarction (ICD-415.1) Indication 2: Deep Vein Thrombosis - Leg (ICD-451.1) Lab Used: [place] Site: [facility] INR POC 3.4 INR RANGE 2 - 3  Dietary changes: yes       Details: less vitamin K intake  Health status changes: yes       Details: Broke her right ankle on Christmas day. Is now in plaque  Bleeding/hemorrhagic complications: no    Recent/future hospitalizations: no    Any changes in medication regimen? no    Recent/future dental: no  Any missed doses?: no       Is patient compliant with meds? yes       Allergies: No Known Drug Allergies  Anticoagulation Management History:      The patient is taking warfarin and comes in today for a routine follow up visit.  Positive risk factors for bleeding include presence of serious comorbidities.  Negative risk factors for bleeding include an age less than 62 years old.  The bleeding index is 'intermediate risk'.  Positive CHADS2 values include History of HTN and History of Diabetes.  Negative CHADS2 values include Age > 89 years old.  The start date was 10/02/2008.  Her last INR was 4.7 ratio.  Anticoagulation responsible provider: Aalijah Lanphere MD, Quillian Quince.  INR POC: 3.4.  Cuvette Lot#: [id number].  Exp: 08/2010.    Anticoagulation Management Assessment/Plan:      The patient's current anticoagulation dose is Coumadin 5 mg tabs: 1 by mouth daily or as directed.  The target INR is 2.0-3.0.  The next INR is due 06/26/2009.  Anticoagulation instructions were given to patient.  Results were reviewed/authorized by Alinda Deem, PharmD, BCPS, CPP.  She was notified by Graylon Good D Candidate.         Prior Anticoagulation Instructions: INR 2.5  Continue on same dosage 1 tablet daily except 1/2 tablet on Mondays.  Recheck in 4 weeks.     Current Anticoagulation Instructions: INR: 3.4 Skip Saturday's dose then resume to same dosage of 5mg  tablet daily except 2.5mg  on Mondays Recheck in 3 to 4 weeks

## 2010-06-15 NOTE — Assessment & Plan Note (Signed)
Summary: f/u appt/#/cd   Vital Signs:  Patient profile:   60 year old female Height:      62 inches (157.48 cm) Weight:      227.12 pounds (103.24 kg) O2 Sat:      96 % on Room air Temp:     98.4 degrees F (36.89 degrees C) oral Pulse rate:   81 / minute BP sitting:   118 / 72  (left arm) Cuff size:   large  Vitals Entered By: Tomma Lightning (June 26, 2009 3:33 PM)  O2 Flow:  Room air CC: follow-up visit Is Patient Diabetic? No Pain Assessment Patient in pain? no        Primary Care Provider:  Rowe Clack MD  CC:  follow-up visit.  History of Present Illness:  now seeing dr. Gwenette Greet for OSA - HH titrating new readings for better machine and mask fit -  pt skeptical she will ever sleep well - continued episodes of daytime sleepiness  fall christmas day 05/09/2009 -  tibia fx L ankle - same side as prior DVT still feeling "moving sensation" in her LLE - affected by DVT 5/10 trying to walk and move but says it is aggrevated walking uphill such as for parking at work able to walk >284ft mgmt by [facility] coumadin clinic for anticoag  DM - no problems - tries to watch diet  HTN- reads "ok" when she checks herself  taking meds as rx'd - no new problems  still tired and fatigued  Clinical Review Panels:  Lipid Management   Cholesterol:  204 (10/14/2008)   HDL (good cholesterol):  48.80 (10/14/2008)  Diabetes Management   HgBA1C:  6.2 (10/14/2008)   Creatinine:  1.9 (10/14/2008)   Last Foot Exam:  yes (09/29/2008)   Last Flu Vaccine:  Historical (02/13/2009)   Last Pneumovax:  Pneumovax (06/26/2009)  CBC   WBC:  4.3 (10/14/2008)   RBC:  3.64 (10/14/2008)   Hgb:  11.2 (10/14/2008)   Hct:  32.6 (10/14/2008)   Platelets:  239.0 (10/14/2008)   MCV  89.6 (10/14/2008)   MCHC  34.4 (10/14/2008)   RDW  13.2 (10/14/2008)   PMN:  48.6 (10/14/2008)   Lymphs:  39.8 (10/14/2008)   Monos:  7.5 (10/14/2008)   Eosinophils:  3.2 (10/14/2008)   Basophil:  0.9  (10/14/2008)  Complete Metabolic Panel   Glucose:  162 (10/14/2008)   Sodium:  141 (10/14/2008)   Potassium:  5.1 (10/14/2008)   Chloride:  110 (10/14/2008)   CO2:  28 (10/14/2008)   BUN:  32 (10/14/2008)   Creatinine:  1.9 (10/14/2008)   Albumin:  3.7 (10/14/2008)   Total Protein:  7.5 (10/14/2008)   Calcium:  9.8 (10/14/2008)   Total Bili:  0.7 (10/14/2008)   Alk Phos:  64 (10/14/2008)   SGPT (ALT):  23 (10/14/2008)   SGOT (AST):  21 (10/14/2008)   Current Medications (verified): 1)  Coumadin 5 Mg Tabs (Warfarin Sodium) .[name].. 1 By Mouth Daily or As Directed 2)  Proair Hfa 108 (90 Base) Mcg/act Aers (Albuterol Sulfate) .... Take 2 Puffs Q 4 Hours Prn 3)  Norvasc 10 Mg Tabs (Amlodipine Besylate) .... Take 1 By Mouth Qd 4)  Coreg 25 Mg Tabs (Carvedilol) .... Take 1 Two Times A Day 5)  Cozaar 100 Mg Tabs (Losartan Potassium) .... Take 1 By Mouth Qd 6)  Lisinopril 20 Mg Tabs (Lisinopril) .... Take 1 Two Times A Day 7)  Pravastatin Sodium 40 Mg Tabs (Pravastatin Sodium) .... Take  1 At Bedtime 8)  Furosemide 20 Mg Tabs (Furosemide) .... Take 1 Two Times A Day  Allergies (verified): No Known Drug Allergies  Past History:  Past Medical History: Anemia-NOS Diabetes mellitus, type II Hyperlipidemia Hypertension Renal insufficiency - CKD 3 Pulmonary embolism 09/2008 Breast cancer, hx of OSA - CPAP  MD rooster: pulm -[facility] -  Past Surgical History: Reviewed history from 09/29/2008 and no changes required. Mastectomy 1995  Review of Systems  The patient denies fever, vision loss, chest pain, peripheral edema, and headaches.    Physical Exam  General:  obese, alert, well-developed, well-nourished, and cooperative to examination.    Lungs:  normal respiratory effort, no intercostal retractions or use of accessory muscles; normal breath sounds bilaterally - no crackles and no wheezes.    Heart:  normal rate, regular rhythm, no murmur, and no rub. BLE with mild chronic  edema L>R. normal DP pulses and normal cap refill in all 4 extremities    Msk:  wearing ankle support brace on left side (tib fx 05/09/09)   Impression & Recommendations:  Problem # 1:  CHRONIC KIDNEY DISEASE STAGE III (MODERATE) (ICD-585.3) check labs today- refer to local neph as pt no longer going to [facility] for same and has been foolowed by renal there - send for records to review Orders: TLB-BMP (Basic Metabolic Panel-BMET) (99991111) Nephrology Referral (Nephro)  Labs Reviewed: BUN: 32 (10/14/2008)   Cr: 1.9 (10/14/2008)    Hgb: 11.2 (10/14/2008)   Hct: 32.6 (10/14/2008)   Ca++: 9.8 (10/14/2008)    TP: 7.5 (10/14/2008)   Alb: 3.7 (10/14/2008)  Problem # 2:  PROTEINURIA (ICD-791.0) see above  Orders: TLB-Udip ONLY ([id number]) Nephrology Referral (Nephro)  Problem # 3:  OBSTRUCTIVE SLEEP APNEA (ICD-327.23) cont mgmt as per pulm -[facility] - last noted reviewed: the pt tells me that she is wearing cpap compliantly, but still has some periods of sleepiness while at work.  It is unclear whether this is a compliance issue, or whether it is related to her sleep hygeine.  I have stressed the importance of getting adequate quantity of sleep, and have gone over good sleep hygeine which may help.  I will get a download from her machine to look at compliance, and have also encouraged her to work aggressively on weight loss.  Problem # 4:  PULMONARY EMBOLISM (ICD-415.19) cont mgmt as per [facility]- given accidental fall and fx with ?dec mobility, favor anticoag thru 12 mos before consideration of stopping - may need opinion from heme onc as well, especially with hx of breast ca.... Her updated medication list for this problem includes:    Coumadin 5 Mg Tabs (Warfarin sodium) .[name]... 1 by mouth daily or as directed  Reviewed the following: PT: 20.5 (12/05/2008)   INR: 4.7 ratio (10/07/2008)    Coumadin Dose (weekly): 32.50 mg (05/29/2009) Prior Coumadin Dose (weekly): 32.50 mg  (05/29/2009) Next Protime: 06/26/2009 (dated on 05/29/2009)  Problem # 5:  HYPERTENSION (ICD-401.9)  Her updated medication list for this problem includes:    Norvasc 10 Mg Tabs (Amlodipine besylate) .[name]... Take 1 by mouth qd    Coreg 25 Mg Tabs (Carvedilol) .[name]... Take 1 two times a day    Cozaar 100 Mg Tabs (Losartan potassium) .[name]... Take 1 by mouth qd    Lisinopril 20 Mg Tabs (Lisinopril) .[name]... Take 1 two times a day    Furosemide 20 Mg Tabs (Furosemide) .[name]... Take 1 two times a day  Orders: TLB-BMP (Basic Metabolic Panel-BMET) (99991111)  BP today: 118/72 Prior BP: 134/72 (12/01/2008)  Labs Reviewed: K+: 5.1 (10/14/2008) Creat: : 1.9 (10/14/2008)   Chol: 204 (10/14/2008)   HDL: 48.80 (10/14/2008)   TG: 121.0 (10/14/2008)  Problem # 6:  HYPERLIPIDEMIA (ICD-272.4)  Her updated medication list for this problem includes:    Pravastatin Sodium 40 Mg Tabs (Pravastatin sodium) .[name]... Take 1 at bedtime  Labs Reviewed: SGOT: 21 (10/14/2008)   SGPT: 23 (10/14/2008)   HDL:48.80 (10/14/2008)  Chol:204 (10/14/2008)  Trig:121.0 (10/14/2008)  Complete Medication List: 1)  Coumadin 5 Mg Tabs (Warfarin sodium) .[name].. 1 by mouth daily or as directed 2)  Proair Hfa 108 (90 Base) Mcg/act Aers (Albuterol sulfate) .... Take 2 puffs q 4 hours prn 3)  Norvasc 10 Mg Tabs (Amlodipine besylate) .... Take 1 by mouth qd 4)  Coreg 25 Mg Tabs (Carvedilol) .... Take 1 two times a day 5)  Cozaar 100 Mg Tabs (Losartan potassium) .... Take 1 by mouth qd 6)  Lisinopril 20 Mg Tabs (Lisinopril) .... Take 1 two times a day 7)  Pravastatin Sodium 40 Mg Tabs (Pravastatin sodium) .... Take 1 at bedtime 8)  Furosemide 20 Mg Tabs (Furosemide) .... Take 1 two times a day  Other Orders: TLB-A1C / Hgb A1C (Glycohemoglobin) ([id number]) Misc. Referral (Misc. Ref) Gynecologic Referral (Gyn) Pneumococcal Vaccine [phone]) Admin 1st Vaccine [phone])  Patient Instructions: 1)  it was good to see you today. 2)  no  medications changes - continue as you are doing - will continue coumadin thru May 2011 and then evaluate where we are at with this treatment - 3)  test(s) ordered today - your results will be posted on the phone tree for review in 48-72 hours from the time of test completion; call [phone] and enter your 9 digit MRN (listed above on this page, just below your name); if any changes need to be made or there are abnormal results, you will be contacted directly.  4)  will send for records from [facility] -primary care and nephrology notes and labs 5)  we'll make referral for mammogram, PAP/pelvic and to nephrologist. Our office will contact you regarding these appointments once made.  6)  Will consider referral for colonoscopy once you are off coumadin...   Immunization History:  Influenza Immunization History:    Influenza:  historical (02/13/2009)  Immunizations Administered:  Pneumonia Vaccine:    Vaccine Type: Pneumovax    Site: left deltoid    Mfr: [employer]ck    Dose: 0.5 ml    Route: IM    Given by: Tomma Lightning    Exp. Date: 06/11/2010    Lot #: [id number]    VIS given: 06/26/09

## 2010-06-15 NOTE — Medication Information (Signed)
Summary: rov/sp  Anticoagulant Therapy  Managed by: Porfirio Oar, PharmD PCP: Rowe Clack MD Supervising MD: Ron Parker MD, Dellis Filbert Indication 1: Pulmonary Embolism and Infarction (ICD-415.1) Indication 2: Deep Vein Thrombosis - Leg (ICD-451.1) Lab Used: [place] Site: [facility] INR POC 1.6 INR RANGE 2 - 3  Dietary changes: yes       Details: liquid diet for a few days last week  Health status changes: no    Bleeding/hemorrhagic complications: no    Recent/future hospitalizations: yes       Details: was recently in the hospital- diagnosed with type 2 DM  Any changes in medication regimen? yes       Details: started lantus, regular insulin, and stopped lisinopril   Recent/future dental: no  Any missed doses?: yes     Details: doesn't think she took any Coumadin while in the hospital   Is patient compliant with meds? yes       Allergies: No Known Drug Allergies  Anticoagulation Management History:      The patient is taking warfarin and comes in today for a routine follow up visit.  Positive risk factors for bleeding include presence of serious comorbidities.  Negative risk factors for bleeding include an age less than 22 years old.  The bleeding index is 'intermediate risk'.  Positive CHADS2 values include History of HTN and History of Diabetes.  Negative CHADS2 values include Age > 18 years old.  The start date was 10/02/2008.  Her last INR was 4.7 ratio.  Anticoagulation responsible provider: Ron Parker MD, Dellis Filbert.  INR POC: 1.6.  Cuvette Lot#: [id number].  Exp: 10/2010.    Anticoagulation Management Assessment/Plan:      The patient's current anticoagulation dose is Coumadin 5 mg tabs: 1 by mouth daily or as directed.  The target INR is 2.0-3.0.  The next INR is due 10/16/2009.  Anticoagulation instructions were given to patient.  Results were reviewed/authorized by Porfirio Oar, PharmD.  She was notified by Porfirio Oar PharmD.         Prior Anticoagulation Instructions: INR  4.3  Skip today's dose of Coumadin then decrease dose to 1/2 tablet every day except 1 tablet on Sunday, Tuesday and Thursday   Current Anticoagulation Instructions: INR 1.6  Take 1 tablet today and tomorrow then resume same dose of 1/2 tablet every day except 1 tablet on Sunday, Tuesday and Thursday

## 2010-06-15 NOTE — Letter (Signed)
Summary: [facility]  [facility]   Imported By: Phillis Knack 10/27/2009 10:43:43  _____________________________________________________________________  External Attachment:    Type:   Image     Comment:   External Document

## 2010-06-15 NOTE — Consult Note (Signed)
Summary: [facility]   Imported By: Phillis Knack 08/11/2009 13:53:14  _____________________________________________________________________  External Attachment:    Type:   Image     Comment:   External Document

## 2010-06-15 NOTE — Letter (Signed)
Summary: Letter of Medical Necessity to [facility]  Letter of Medical Necessity to [facility]   Imported By: Phillis Knack 03/18/2010 08:39:00  _____________________________________________________________________  External Attachment:    Type:   Image     Comment:   External Document

## 2010-06-15 NOTE — Assessment & Plan Note (Signed)
Summary: rov for osa   Copy to:  Gwendolyn Grant Primary Provider/Referring Provider:  Rowe Clack MD  CC:  Follow up.  Pt states she is wearing cpap everynight for approx 5-6 hours each night.  denies problems with mask and pressure.  requesting to have letter completed for work regarding sleep issuse-states she "tends to nod off a couple of times during the day while at work.".  History of Present Illness: The pt comes in today for f/u of her osa.  She tells me that she is wearing cpap compliantly, but does not get a lot of sleep at night for various reasons.   She denies any issues with mask fit, and has gotten more accustomed to the pressure with no complaints.  She does believe the cpap helps with her sleep and daytime symptoms, however she does occasionally have periods of dozing during the day while at work.  She still is not getting enough sleep at night.  Current Medications (verified): 1)  Coumadin 5 Mg Tabs (Warfarin Sodium) .[name].. 1 By Mouth Daily or As Directed 2)  Proair Hfa 108 (90 Base) Mcg/act Aers (Albuterol Sulfate) .... Take 2 Puffs Q 4 Hours Prn 3)  Norvasc 10 Mg Tabs (Amlodipine Besylate) .... Take 1 By Mouth Qd 4)  Coreg 25 Mg Tabs (Carvedilol) .... Take 1 Two Times A Day 5)  Cozaar 100 Mg Tabs (Losartan Potassium) .... Take 1 By Mouth Qd 6)  Lisinopril 20 Mg Tabs (Lisinopril) .... Take 1 Two Times A Day 7)  Pravastatin Sodium 40 Mg Tabs (Pravastatin Sodium) .... Take 1 At Bedtime 8)  Furosemide 20 Mg Tabs (Furosemide) .... Take 1 Two Times A Day  Allergies (verified): No Known Drug Allergies  Review of Systems      See HPI  Vital Signs:  Patient profile:   60 year old female Height:      61.5 inches Weight:      232 pounds BMI:     43.28 O2 Sat:      96 % on Room air Temp:     98.3 degrees F oral Pulse rate:   75 / minute BP sitting:   120 / 62  (left arm) Cuff size:   large  Vitals Entered By: Raymondo Band RN (June 26, 2009 2:18 PM)  O2  Flow:  Room air CC: Follow up.  Pt states she is wearing cpap everynight for approx 5-6 hours each night.  denies problems with mask and pressure.  requesting to have letter completed for work regarding sleep issuse-states she "tends to nod off a couple of times during the day while at work." Comments Medications reviewed with patient Daytime contact number verified with patient. Raymondo Band RN  June 26, 2009 2:22 PM     Physical Exam  General:  ow female in nad Nose:  no skin breakdown or pressure necrosis from cpap mask Neurologic:  minimally sleepy, moves all 4.   Impression & Recommendations:  Problem # 1:  OBSTRUCTIVE SLEEP APNEA (ICD-327.23) the pt tells me that she is wearing cpap compliantly, but still has some periods of sleepiness while at work.  It is unclear whether this is a compliance issue, or whether it is related to her sleep hygeine.  I have stressed the importance of getting adequate quantity of sleep, and have gone over good sleep hygeine which may help.  I will get a download from her machine to look at compliance, and have also encouraged her to work aggressively  on weight loss.  Other Orders: Est. Patient Level III [id number]) DME Referral (DME) DME Referral (DME)  Patient Instructions: 1)  stay on cpap as directed.  Will get download off your machine for diagnostics. 2)  work on weight loss 3)  followup with me in one year.  I will call you with results of download.

## 2010-06-15 NOTE — Letter (Signed)
Summary: Request for Updated Lipid Profile / [facility]  Request for Updated Lipid Profile / [facility]   Imported By: Rise Patience 01/08/2010 15:01:06  _____________________________________________________________________  External Attachment:    Type:   Image     Comment:   External Document

## 2010-06-15 NOTE — Progress Notes (Signed)
Summary: Referral  Phone Note Call from Patient Call back at Home Phone [phone]   Caller: Patient Summary of Call: Pt called requesting new Endo referral to Dr. Chalmers Cater at [facility]. Pt states that she was not satisfied with previous Endo MD. Initial call taken by: Crissie Sickles, [place],  December 16, 2009 1:07 PM  Follow-up for Phone Call        ok - request for new endo w/ balan ordered - thanks Follow-up by: Rowe Clack MD,  December 16, 2009 1:24 PM  Additional Follow-up for Phone Call Additional follow up Details #1::        Pt informed via M, told to expect a call from [facility] Additional Follow-up by: Crissie Sickles, CMA,  December 16, 2009 2:06 PM

## 2010-06-15 NOTE — Assessment & Plan Note (Signed)
Summary: 3 MTH FU---STC   Vital Signs:  Patient profile:   60 year old female Height:      62 inches (157.48 cm) Weight:      238.0 pounds (108.18 kg) O2 Sat:      96 % on Room air Temp:     98.5 degrees F (36.94 degrees C) oral Pulse rate:   91 / minute BP sitting:   110 / 78  (left arm) Cuff size:   large  Vitals Entered By: Tomma Lightning RMA (March 30, 2010 8:52 AM)  O2 Flow:  Room air CC: 3 mon th follow-up Is Patient Diabetic? Yes Did you bring your meter with you today? No Pain Assessment Patient in pain? no        Primary Care Provider:  Rowe Clack MD  CC:  3 mon th follow-up.  History of Present Illness: here for f/u  1) DM2 - as above  [facility] hosp 09/2009 - prev diet controlled - follows now with endo (balan) - now on insulin - home log reviewed - checking cbgs 3-4x/d - no hypoglycemic events or symptoms - continued PU/PD but less dramatic than before hosp-  2) HTN - no CP or HA, no vision change or lightheaded symptoms - reports compliance with ongoing medical treatment and no changes in medication dose or frequency. denies adverse side effects related to current therapy.   3) CKD - worried that renal fx was worse in hosp than it had been - edema is better than usual - no confusion or twitching - now seen by renal  4) PE hx 09/2008 - on anticoag x 18 months; - no CP or SOB, no leg swelling - denies weight loss - no recurrent cancer (breast 1995 w/o recurrence), no FH clots or prior DVT hx - wants to stop coumadin  still feels she needs renewed hanicap parking tag due to long distance to walk uphill at work and pain in legs with this swelling   Clinical Review Panels:  Lipid Management   Cholesterol:  183 (12/29/2009)   LDL (bad choesterol):  104 (12/29/2009)   HDL (good cholesterol):  44.00 (12/29/2009)  Diabetes Management   HgBA1C:  5.8 (12/29/2009)   Creatinine:  1.6 (10/02/2009)   Last Dilated Eye Exam:  normal, addtional findings Cataracts  (11/04/2009)   Last Foot Exam:  yes (09/29/2008)   Last Flu Vaccine:  Historical given @ work (03/22/2010)   Last Pneumovax:  Pneumovax (06/26/2009)  CBC   WBC:  4.3 (10/14/2008)   RBC:  3.64 (10/14/2008)   Hgb:  11.2 (10/14/2008)   Hct:  32.6 (10/14/2008)   Platelets:  239.0 (10/14/2008)   MCV  89.6 (10/14/2008)   MCHC  34.4 (10/14/2008)   RDW  13.2 (10/14/2008)   PMN:  48.6 (10/14/2008)   Lymphs:  39.8 (10/14/2008)   Monos:  7.5 (10/14/2008)   Eosinophils:  3.2 (10/14/2008)   Basophil:  0.9 (10/14/2008)  Complete Metabolic Panel   Glucose:  129 (10/02/2009)   Sodium:  143 (10/02/2009)   Potassium:  5.2 (10/02/2009)   Chloride:  109 (10/02/2009)   CO2:  30 (10/02/2009)   BUN:  28 (10/02/2009)   Creatinine:  1.6 (10/02/2009)   Albumin:  3.2 (09/24/2009)   Total Protein:  6.3 (09/24/2009)   Calcium:  9.3 (10/02/2009)   Total Bili:  0.5 (09/24/2009)   Alk Phos:  91 (09/24/2009)   SGPT (ALT):  20 (09/24/2009)   SGOT (AST):  26 (09/24/2009)   Current  Medications (verified): 1)  Coumadin 5 Mg Tabs (Warfarin Sodium) .[name].. 1 By Mouth Daily or As Directed 2)  Proair Hfa 108 (90 Base) Mcg/act Aers (Albuterol Sulfate) .... Take 2 Puffs Q 4 Hours Prn 3)  Norvasc 10 Mg Tabs (Amlodipine Besylate) .... Take 1 By Mouth Qd 4)  Coreg 25 Mg Tabs (Carvedilol) .... Take 1 Two Times A Day 5)  Cozaar 100 Mg Tabs (Losartan Potassium) .... Take 1 By Mouth Qd 6)  Lisinopril 20 Mg Tabs (Lisinopril) .... Hold Until Further Notice 7)  Pravastatin Sodium 40 Mg Tabs (Pravastatin Sodium) .... Take 1 At Bedtime 8)  Furosemide 20 Mg Tabs (Furosemide) .... Take 1 By Mouth Once Daily As Needed For Swelling 9)  Ferrex 150 150 Mg Caps (Polysaccharide Iron Complex) .... Take 1 By Mouth Once Daily 10)  Truetrack Blood Glucose  Devi (Blood Glucose Monitoring Suppl) .... Use As Directed  Dx 250.00 11)  Humalog Kwikpen 100 Unit/ml Soln (Insulin Lispro (Human)) .[name].. 10 Units Three Times A Day (Just Before Each  Meal) + 3 Units If Cbg >150 (Or As Directed), and Pen Needles 5x A Day 12)  Lantus Solostar 100 Unit/ml Soln (Insulin Glargine) .... 30 Units Two Times A Day 13)  Onetouch Ultra Mini W/device Kit (Blood Glucose Monitoring Suppl) .... Use As Directed 14)  Onetouch Ultra Blue  Strp (Glucose Blood) .... Use Check Blood Sugar Three Times A Day 15)  Onetouch Lancets  Misc (Lancets) .... Use Three Times A Day  Allergies (verified): No Known Drug Allergies  Past History:  Past Medical History: Anemia-NOS Diabetes mellitus, type II Hyperlipidemia Hypertension Renal insufficiency - CKD 3 Pulmonary embolism 09/2008 s/p anticoag thru 03/2010 Breast cancer, hx - 1995 OSA - CPAP    MD roster: pulm -[facility] - [facility] - balan  Review of Systems  The patient denies anorexia, weight loss, chest pain, and headaches.    Physical Exam  General:  obese, alert, well-developed, well-nourished, and cooperative to examination.    Lungs:  normal respiratory effort, no intercostal retractions or use of accessory muscles; normal breath sounds bilaterally - no crackles and no wheezes.    Heart:  normal rate, regular rhythm, no murmur, and no rub. BLE with improvement in mild chronic edema L>R.    Impression & Recommendations:  Problem # 1:  PULMONARY EMBOLISM (ICD-415.19)  The following medications were removed from the medication list:    Coumadin 5 Mg Tabs (Warfarin sodium) .[name]... 1 by mouth daily or as directed Her updated medication list for this problem includes:    Aspirin 325 Mg Tabs (Aspirin) .[name]... 1 by mouth once dailyed  tx >18 mo ongoing for same since summer 2010 - no known risk factors for hypercoag state - discussed risk of recurrent VTE/PE off treat,ent and pt wishes to stop coumadin start ASA 325 instead - pt will call if problems  Reviewed the following: PT: 20.5 (12/05/2008)   INR: 4.7 ratio (10/07/2008)    Coumadin Dose (weekly): 32.50 mg (12/03/2009) Prior  Coumadin Dose (weekly): 32.50 mg (12/03/2009) Next Protime: 12/31/2009 (dated on 12/03/2009)  Reviewed the following: PT: 20.5 (12/05/2008)   INR: 4.7 ratio (10/07/2008)    Coumadin Dose (weekly): 32.50 mg (03/30/2010) Prior Coumadin Dose (weekly): 32.50 mg (03/30/2010) Next Protime: 04/27/2010 (dated on 03/30/2010)  Time spent with patient 25 minutes, more than 50% of this time was spent counseling patient on risk of recurrent DVT/PE and signs or symptoms to watch for indicating recurrent dz -  Problem # 2:  DIABETES MELLITUS, TYPE II (ICD-250.00)  Her updated medication list for this problem includes:    Cozaar 100 Mg Tabs (Losartan potassium) .[name]... Take 1 by mouth qd    Lisinopril 20 Mg Tabs (Lisinopril) ..... Hold until further notice    Humalog Kwikpen 100 Unit/ml Soln (Insulin lispro (human)) .Marland KitchenMarland KitchenMarland KitchenMarland Kitchen 10 units three times a day (just before each meal) + 3 units if cbg >150 (or as directed), and pen needles 5x a day    Lantus Solostar 100 Unit/ml Soln (Insulin glargine) .Marland KitchenMarland KitchenMarland KitchenMarland Kitchen 30 units two times a day    Aspirin 325 Mg Tabs (Aspirin) .[name]... 1 by mouth once daily med mgmt per endo - balan  Labs Reviewed: Creat: 1.6 (10/02/2009)     Last Eye Exam: normal, addtional findings Cataracts (11/04/2009) Reviewed HgBA1c results: 5.8 (12/29/2009)  12.5 (09/24/2009)  Problem # 3:  HYPERTENSION (ICD-401.9)  Her updated medication list for this problem includes:    Norvasc 10 Mg Tabs (Amlodipine besylate) .[name]... Take 1 by mouth qd    Coreg 25 Mg Tabs (Carvedilol) .[name]... Take 1 two times a day    Cozaar 100 Mg Tabs (Losartan potassium) .[name]... Take 1 by mouth qd    Lisinopril 20 Mg Tabs (Lisinopril) ..... Hold until further notice    Furosemide 20 Mg Tabs (Furosemide) .[name]... Take 1 by mouth once daily as needed for swelling  BP today: 110/78 Prior BP: 120/72 (12/29/2009)  Labs Reviewed: K+: 5.2 (10/02/2009) Creat: : 1.6 (10/02/2009)   Chol: 183 (12/29/2009)   HDL: 44.00 (12/29/2009)   LDL: 104  (12/29/2009)   TG: 173.0 (12/29/2009)  Problem # 4:  HYPERLIPIDEMIA (ICD-272.4)  Her updated medication list for this problem includes:    Pravastatin Sodium 40 Mg Tabs (Pravastatin sodium) .[name]... Take 1 at bedtime  Labs Reviewed: SGOT: 26 (09/24/2009)   SGPT: 20 (09/24/2009)   HDL:44.00 (12/29/2009), 48.80 (10/14/2008)  LDL:104 (12/29/2009)  Chol:183 (12/29/2009), 204 (10/14/2008)  Trig:173.0 (12/29/2009), 121.0 (10/14/2008)  Complete Medication List: 1)  Proair Hfa 108 (90 Base) Mcg/act Aers (Albuterol sulfate) .... Take 2 puffs q 4 hours prn 2)  Norvasc 10 Mg Tabs (Amlodipine besylate) .... Take 1 by mouth qd 3)  Coreg 25 Mg Tabs (Carvedilol) .... Take 1 two times a day 4)  Cozaar 100 Mg Tabs (Losartan potassium) .... Take 1 by mouth qd 5)  Lisinopril 20 Mg Tabs (Lisinopril) .... Hold until further notice 6)  Pravastatin Sodium 40 Mg Tabs (Pravastatin sodium) .... Take 1 at bedtime 7)  Furosemide 20 Mg Tabs (Furosemide) .... Take 1 by mouth once daily as needed for swelling 8)  Ferrex 150 150 Mg Caps (Polysaccharide iron complex) .... Take 1 by mouth once daily 9)  Truetrack Blood Glucose Devi (Blood glucose monitoring suppl) .... Use as directed  dx 250.00 10)  Humalog Kwikpen 100 Unit/ml Soln (Insulin lispro (human)) .[name].. 10 units three times a day (just before each meal) + 3 units if cbg >150 (or as directed), and pen needles 5x a day 11)  Lantus Solostar 100 Unit/ml Soln (Insulin glargine) .... 30 units two times a day 12)  Onetouch Ultra Mini W/device Kit (Blood glucose monitoring suppl) .... Use as directed 13)  Onetouch Ultra Blue Strp (Glucose blood) .... Use check blood sugar three times a day 14)  Onetouch Lancets Misc (Lancets) .... Use three times a day 15)  Aspirin 325 Mg Tabs (Aspirin) .[name].. 1 by mouth once daily  Patient Instructions: 1)  it was good to  see you today. 2)  stop coumadin and start aspirin 325mg  daily - watch for change in leg swelling or chest pain or  breathing problems - call us right away if any problems! 3)  continue to keep followup as planned with dr. Chalmers Cater and Clover Mealy 4)  Please schedule a follow-up appointment in 6 months to monitor bloos pressure and cholesterol, call sooner if problems.    Orders Added: 1)  Est. Patient Level IV [id number]   Immunization History:  Influenza Immunization History:    Influenza:  historical given @ work (03/22/2010)   Immunization History:  Influenza Immunization History:    Influenza:  Historical given @ work (03/22/2010)

## 2010-06-15 NOTE — Progress Notes (Signed)
Summary: pravastatin & coreg  Phone Note Refill Request   Refills Requested: Medication #1:  COREG 25 MG TABS take 1 two times a day  Medication #2:  PRAVASTATIN SODIUM 40 MG TABS take 1 at bedtime  Method Requested: Electronic Initial call taken by: Tomma Lightning RMA,  February 03, 2010 8:45 AM    Prescriptions: PRAVASTATIN SODIUM 40 MG TABS (PRAVASTATIN SODIUM) take 1 at bedtime  #90 x 3   Entered by:   Tomma Lightning RMA   Authorized by:   Rowe Clack MD   Signed by:   Tomma Lightning RMA on 02/03/2010   Method used:   Electronically to        [facility] (retail)       [address]       [place], [place]  [address]       Ph: [phone]       Fax: [phone]   [id number] COREG 25 MG TABS (CARVEDILOL) take 1 two times a day  #180 x 3   Entered by:   Tomma Lightning RMA   Authorized by:   Rowe Clack MD   Signed by:   Tomma Lightning RMA on 02/03/2010   Method used:   Electronically to        [facility] (retail)       [address]       [place], [place]  [address]       Ph: [phone]       Fax: [phone]   RxID:   [id number]

## 2010-06-15 NOTE — Progress Notes (Signed)
Summary: medical release  Phone Note Outgoing Call   Call placed by: Tomma Lightning,  July 03, 2009 3:49 PM Summary of Call: Recieved authorization back from Dr. Shon Millet stating that they do not have a patient in there system with name Royal Piedra Initial call taken by: Tomma Lightning,  July 03, 2009 3:51 PM

## 2010-06-15 NOTE — Assessment & Plan Note (Signed)
Summary: f/u/kh   Vital Signs:  Patient profile:   60 year old female Height:      62 inches Weight:      233.3 pounds BMI:     42.83  Vitals Entered By: Iver Nestle PHD (December 08, 2009 3:12 PM)  Primary Care Provider:  Rowe Clack MD   History of Present Illness: Assessment:  Spent 30 minutes with pt.  FBG has been in the 120s; range has been 124 to 137.  Mamediarra has been hypoglycemic  ~4 times, w/ BG 76-80s.  She does know to eat some CHO at these times, but has not yet been instructed in insulin adjustments for low pre-meal BG.  Teletha has been going to the gym 1 X wk with a personal trainer, and she is still walking on the TM 15 min twice a day.  24-hr recall suggests intake of  ~1120 kcal: B (9 AM)- 1 c coffee w/ 1/2 creamer, instant oatmeal w/ sweetener & 1 t margarine; Snk (11:45 AM)- 1 peach; L (PM)- 1 chx skinless breast, 2 c cabbage & turnip, water; Snk (4:30PM)- 1 nectarine, 1 plum; D (PM)- ditto lunch; Snk (PM)- Wt Watcher Fudge Bar (100 kcal), diet soda.  We spent some time today talking re. urges for sweets, which Shernice finds challenging.    Nutrition Diagnosis:  Progress noted on physical inactivity (NB-2.1) related to time constraints as evidenced by exercise (walking) 15 min 2 X day plus an hour/wk personal trainer workout.  Progress on inappropriate intake of types of carbohydrate (NI-53.3) related to portion sizes of starchy foods and inadequate vegetable intake as evidenced by limited starches at meals, and 4 c veg's consumed yesterday.    Intervention: See Patient Instructions.    Monitoring/Eval:  Dietary intake, body weight, and exercise at 4-wk F/U.     Allergies: No Known Drug Allergies   Complete Medication List: 1)  Coumadin 5 Mg Tabs (Warfarin sodium) .[name].. 1 by mouth daily or as directed 2)  Proair Hfa 108 (90 Base) Mcg/act Aers (Albuterol sulfate) .... Take 2 puffs q 4 hours prn 3)  Norvasc 10 Mg Tabs (Amlodipine besylate) .... Take 1 by mouth  qd 4)  Coreg 25 Mg Tabs (Carvedilol) .... Take 1 two times a day 5)  Cozaar 100 Mg Tabs (Losartan potassium) .... Take 1 by mouth qd 6)  Lisinopril 20 Mg Tabs (Lisinopril) .... Hold until further notice 7)  Pravastatin Sodium 40 Mg Tabs (Pravastatin sodium) .... Take 1 at bedtime 8)  Furosemide 20 Mg Tabs (Furosemide) .... Take 1 by mouth once daily as needed for swelling 9)  Ferrex 150 150 Mg Caps (Polysaccharide iron complex) .... Take 1 by mouth once daily 10)  Truetrack Blood Glucose Devi (Blood glucose monitoring suppl) .... Use as directed  dx 250.00 11)  Truetrack Test Strp (Glucose blood) .... Use as directed 12)  Lancets Misc (Lancets) .... Use as directed 13)  Humalog Kwikpen 100 Unit/ml Soln (Insulin lispro (human)) .[name].. 10 units three times a day (just before each meal), and pen needles 5x a day 14)  Lantus Solostar 100 Unit/ml Soln (Insulin glargine) .... 30 units two times a day 15)  Diabetic Shoes 250.00  .... Use as directed  Other Orders: [id number])  Patient Instructions: 1)  To be on the safe side, you should always carry a quick sugar source with you.   2)  Meet with the pharmacist again, to talk about how to  adjust your insulin based on your pre-meal reading.   3)  Meanwhile, track your blood glucose (BG) readings on the form provided, and pay attention to when they're high or low, and why.   4)  Foood urges:  Remember the 3 Ds:  Delay, Distract, Distance yourself.  Then DETERMINE:  What's happening right now, and what are my options?  And DECIDE on your action plan.   5)  USE your handout from today on the eating decisions algorithm.   6)  Please call for a Sept nutr appt:  [phone].  Remember to identify yourself as LINK to [facility].

## 2010-06-15 NOTE — Medication Information (Signed)
Summary: rov/ewj  Anticoagulant Therapy  Managed by: Porfirio Oar, PharmD PCP: Rowe Clack MD Supervising MD: Haroldine Laws MD, Quillian Quince Indication 1: Pulmonary Embolism and Infarction (ICD-415.1) Indication 2: Deep Vein Thrombosis - Leg (ICD-451.1) Lab Used: [facility] Site: [facility] INR POC 2.4 INR RANGE 2 - 3  Dietary changes: no    Health status changes: no    Bleeding/hemorrhagic complications: no    Recent/future hospitalizations: no    Any changes in medication regimen? no    Recent/future dental: no  Any missed doses?: no       Is patient compliant with meds? yes       Allergies: No Known Drug Allergies  Anticoagulation Management History:      The patient is taking warfarin and comes in today for a routine follow up visit.  Positive risk factors for bleeding include presence of serious comorbidities.  Negative risk factors for bleeding include an age less than 3 years old.  The bleeding index is 'intermediate risk'.  Positive CHADS2 values include History of HTN and History of Diabetes.  Negative CHADS2 values include Age > 66 years old.  The start date was 10/02/2008.  Her last INR was 4.7 ratio.  Anticoagulation responsible provider: Bensimhon MD, Quillian Quince.  INR POC: 2.4.  Cuvette Lot#: [id number].  Exp: 02/2011.    Anticoagulation Management Assessment/Plan:      The patient's current anticoagulation dose is Coumadin 5 mg tabs: 1 by mouth daily or as directed.  The target INR is 2.0-3.0.  The next INR is due 01/28/2010.  Anticoagulation instructions were given to patient.  Results were reviewed/authorized by Porfirio Oar, PharmD.  She was notified by Vassie Loll, PharmD Candidate.         Prior Anticoagulation Instructions: INR 2.6  Continue on same dosage 1 tablet daily except 1/2 tablet on Mondays.  Recheck in 4 weeks.    Current Anticoagulation Instructions: INR 2.4  Continue taking 1 tablet (5mg ) every day except take 1/2 tablet (2.5mg ) on Mondays.   Recheck in 4 weeks.

## 2010-06-15 NOTE — Progress Notes (Signed)
Summary: FYI  Phone Note Call from Patient Call back at Home Phone [phone]   Caller: Patient Summary of Call: pt called to inform MD that she fell 05/09/2009 and broke LT leg. Pt is concerned that this may be a problem because of Hx of blood clot in same leg 09/2008 Is there any concern with this? please advise Initial call taken by: Crissie Sickles, [place],  May 19, 2009 1:23 PM  Follow-up for Phone Call        if pt has broken leg and is in hospital, she is certainly getting medication to prevent new clot formation. there is no direct relationship to hx clot in left leg and current problem in left leg -  but if pt mobility is limited due to the new broken bone, she may be at increase risk for a recurrent (new) clot due to immobility -  pt should also discuss her clot hx and risk with whomever is managing her broken leg as that would be the doc to decide on her needing to be on blood thinners (timing, coordination with surg, etc) - hope this helps - thanks Follow-up by: Rowe Clack MD,  May 19, 2009 3:20 PM  Additional Follow-up for Phone Call Additional follow up Details #1::        left message on machine for pt to return my call  Additional Follow-up by: Crissie Sickles, [place],  May 20, 2009 8:10 AM    Additional Follow-up for Phone Call Additional follow up Details #2::    left several messages for pt to return my call, with no call back. closing phone note until further contact from pt. Follow-up by: Crissie Sickles, CMA,  May 21, 2009 11:30 AM   Appended Document: FYI pt called back and was informed of MD's advise

## 2010-06-15 NOTE — Medication Information (Signed)
Summary: rov/ewj  Anticoagulant Therapy  Managed by: Gwynneth Albright, PharmD PCP: Rowe Clack MD Supervising MD: Stanford Breed MD, Aaron Edelman Indication 1: Pulmonary Embolism and Infarction (ICD-415.1) Indication 2: Deep Vein Thrombosis - Leg (ICD-451.1) Lab Used: [place] Site: [facility] INR POC 1.7 INR RANGE 2 - 3  Dietary changes: yes       Details: Changed diet due to diabetes.  Eats more salads.  Health status changes: no    Bleeding/hemorrhagic complications: no    Recent/future hospitalizations: no    Any changes in medication regimen? no    Recent/future dental: no  Any missed doses?: no       Is patient compliant with meds? yes      Comments: Dose increase 6/17 is only 9%. There were three 0.5 tablet days instead of 2 in schedule.  Allergies: No Known Drug Allergies  Anticoagulation Management History:      The patient is taking warfarin and comes in today for a routine follow up visit.  Positive risk factors for bleeding include presence of serious comorbidities.  Negative risk factors for bleeding include an age less than 86 years old.  The bleeding index is 'intermediate risk'.  Positive CHADS2 values include History of HTN and History of Diabetes.  Negative CHADS2 values include Age > 48 years old.  The start date was 10/02/2008.  Her last INR was 4.7 ratio.  Anticoagulation responsible provider: Stanford Breed MD, Aaron Edelman.  INR POC: 1.7.  Cuvette Lot#: [id number].  Exp: 12/2010.    Anticoagulation Management Assessment/Plan:      The patient's current anticoagulation dose is Coumadin 5 mg tabs: 1 by mouth daily or as directed.  The target INR is 2.0-3.0.  The next INR is due 11/13/2009.  Anticoagulation instructions were given to patient.  Results were reviewed/authorized by Gwynneth Albright, PharmD.  She was notified by Gwynneth Albright, PharmD.         Prior Anticoagulation Instructions: INR 1.5  Take 1 tablet today, then start taking 1 tablet daily except 1/2 tablet on  Mondays and Fridays.  Recheck in 2 weeks.    Current Anticoagulation Instructions: INR 1.7. Take 1 tablet today, then take 1 tablet daily except 0.5 tablet on Mondays. Recheck in 2 weeks.

## 2010-06-15 NOTE — Assessment & Plan Note (Signed)
Summary: medlink-nutri/[place]/Juvon Teater   Vital Signs:  Patient profile:   60 year old female Height:      62 inches Weight:      123.5 pounds BMI:     22.67  Vitals Entered By: Iver Nestle PHD (November 12, 2009 1:40 PM)  Primary Care Provider:  Rowe Clack MD   History of Present Illness: Assessment:  Spent 60 minutes with pt.  Ms. Trausch works a 40-hr week for [employer], and an additional 12 hr/wk at [employer], during which she tries to eat her dinner in-between customers.  Her evening job also precludes exercise.  Usual eating pattern includes 3 meals and 3 snacks per day.  She tends to snack a lot, which she has been trying to limit since her DM dx.  Everyday foods/beverages include coffee with 1 tbsp creamer and artif sweetener.  Usual exercise routine includes walking on a TM at home for 15 min 2 X day.  24-hr recall suggests intake of  ~2000 kcal: B (8:45 AM)- coffee with creamer, instant plain oatmeal w/ 1 tsp margarine; Snk (11 AM)- 2 c watermelon, water; L (1:30 PM)- 1 1/2 c pasta salad w/ low-fat dressing, 4 oz salmon; Snk (PM)-  ~1 oz baked potato chips, watermelon; D (PM)- 1 1/2 c cabbage w/ margarine, 6 oz rotissery chicken, water, few grapes; Snk (PM)- 1 c pasta salad, 3 tbsp tuna salad.  FBG have been running in the low 100s.  Other BGs have been all been under 200.    Nutrition Diagnosis:  Physical inactivity (NB-2.1) related to time constraints as evidenced by exercise (walking) no more than 15 min at a time.  Inappropriate intake of types of carbohydrate (NI-53.3) related to portion sizes of starchy foods and inadequate vegetable intake as evidenced by 1 1/2 c pasta at lunch and only veg's consumed only once yesterday.    Intervention: See Patient Instructions.    Monitoring/Eval:  Dietary intake, body weight, and exercise at 2-4-wk F/U.    Allergies: No Known Drug Allergies   Complete Medication List: 1)  Coumadin 5 Mg Tabs (Warfarin sodium) .[name].. 1 by mouth daily or as  directed 2)  Proair Hfa 108 (90 Base) Mcg/act Aers (Albuterol sulfate) .... Take 2 puffs q 4 hours prn 3)  Norvasc 10 Mg Tabs (Amlodipine besylate) .... Take 1 by mouth qd 4)  Coreg 25 Mg Tabs (Carvedilol) .... Take 1 two times a day 5)  Cozaar 100 Mg Tabs (Losartan potassium) .... Take 1 by mouth qd 6)  Lisinopril 20 Mg Tabs (Lisinopril) .... Hold until further notice 7)  Pravastatin Sodium 40 Mg Tabs (Pravastatin sodium) .... Take 1 at bedtime 8)  Furosemide 20 Mg Tabs (Furosemide) .... Take 1 by mouth once daily as needed for swelling 9)  Ferrex 150 150 Mg Caps (Polysaccharide iron complex) .... Take 1 by mouth once daily 10)  Truetrack Blood Glucose Devi (Blood glucose monitoring suppl) .... Use as directed  dx 250.00 11)  Truetrack Test Strp (Glucose blood) .... Use as directed 12)  Lancets Misc (Lancets) .... Use as directed 13)  Humalog Kwikpen 100 Unit/ml Soln (Insulin lispro (human)) .[name].. 10 units three times a day (just before each meal), and pen needles 5x a day 14)  Lantus Solostar 100 Unit/ml Soln (Insulin glargine) .... 30 units two times a day  Other Orders: Reassessment Each 15 min unit- [facility] [id number])  Patient Instructions: 1)  Take inventory:  Make an effort to keep healthy foods in the  house, and the junk foods out.   2)  Saturated fat (animal fats) makes your insulin work less well.  Switch to a trans fat-free tub or squeezable margarine.  3)  Some better alternatives to those high-fat and high-sugar foods:  Experiment with sugar-free ice cream/frozen yogurt, and also consider trying low-fat, or fat-free.  Also keep fresh fruit on hand at all times, and regular yogurt, fat-free, sugar-free fudgecicles.  DO try low-fat alternatives to foods you regularly use.   4)  Make your food choices CONSCIOUS, and whenever possible, give full attention to your meals or snacks.   5)  Obtain twice as many veg's as protein or carbohydrate foods for both lunch and dinner.    6)  At each meal,  limit your starchy foods to TWO servings.  ONE starch food (15 grams) = 1slice bread, 1/2 hamburger roll, 1/2 cup potatoes, pasta, rice; 1 oz cereal.  7)  If a high or low blood sugar, LOOK back at your diet, and what you last ate and when.   8)  Taste preferences are learned; preference follows practice.   9)  An occasional glass of wine is fine (1 serving = 5 oz); count this as a starch food.   10)  Please make a nutrition appt in 2-4 weeks.

## 2010-06-15 NOTE — Progress Notes (Signed)
Summary: Glucose monitor Rx  Phone Note From Pharmacy   Caller: [place]* Summary of Call: Pharmacy called requesting Rx for pt to receive TruTrack Glucometer with DM supplies. Initial call taken by: Crissie Sickles, CMA,  Oct 01, 2009 11:09 AM    New/Updated Medications: TRUETRACK BLOOD GLUCOSE  DEVI (BLOOD GLUCOSE MONITORING SUPPL) use as directed  Dx 250.00 TRUETRACK TEST  STRP (GLUCOSE BLOOD) use as directed LANCETS  MISC (LANCETS) use as directed Prescriptions: TRUETRACK TEST  STRP (GLUCOSE BLOOD) use as directed  #100 x 11   Entered by:   Crissie Sickles, CMA   Authorized by:   Rowe Clack MD   Signed by:   Crissie Sickles, CMA on 10/01/2009   Method used:   Electronically to        [facility] (retail)       [address]       [place], [place]  [address]       Ph: [phone]       Fax: [phone]   [id number] [place] (LANCETS) use as directed  #100 x 11   Entered by:   Crissie Sickles, CMA   Authorized by:   Rowe Clack MD   Signed by:   Crissie Sickles, CMA on 10/01/2009   Method used:   Electronically to        [facility] (retail)       [address]       [place], [place]  [address]       Ph: [phone]       Fax: [phone]   RxID:   [id number] [place] (BLOOD GLUCOSE MONITORING SUPPL) use as directed  Dx 250.00  #1 x 0   Entered by:   Crissie Sickles, CMA   Authorized by:   Rowe Clack MD   Signed by:   Crissie Sickles, CMA on 10/01/2009   Method used:   Electronically to        [facility] (retail)       [address]       [place], [place]  [address]       Ph: [phone]       Fax: [phone]   RxID:   [id number]

## 2010-06-15 NOTE — Letter (Signed)
Summary: Medication Summary / MedLink   Medication Summary / MedLink   Imported By: Rise Patience 04/14/2010 14:29:10  _____________________________________________________________________  External Attachment:    Type:   Image     Comment:   External Document

## 2010-06-15 NOTE — Letter (Signed)
Summary: J.Mardene Speak OD  J.Mardene Speak OD   Imported By: Phillis Knack 11/09/2009 10:26:31  _____________________________________________________________________  External Attachment:    Type:   Image     Comment:   External Document

## 2010-06-15 NOTE — Medication Information (Signed)
Summary: ccr  Anticoagulant Therapy  Managed by: Margaretha Sheffield, PharmD PCP: Rowe Clack MD Supervising MD: Johnsie Cancel MD, Collier Salina Indication 1: Pulmonary Embolism and Infarction (ICD-415.1) Indication 2: Deep Vein Thrombosis - Leg (ICD-451.1) Lab Used: [place] Site: [facility] INR POC 2.3 INR RANGE 2 - 3  Dietary changes: no    Health status changes: no    Bleeding/hemorrhagic complications: no    Recent/future hospitalizations: no    Any changes in medication regimen? no    Recent/future dental: no  Any missed doses?: no       Is patient compliant with meds? yes       Allergies: No Known Drug Allergies  Anticoagulation Management History:      The patient is taking warfarin and comes in today for a routine follow up visit.  Positive risk factors for bleeding include presence of serious comorbidities.  Negative risk factors for bleeding include an age less than 49 years old.  The bleeding index is 'intermediate risk'.  Positive CHADS2 values include History of HTN and History of Diabetes.  Negative CHADS2 values include Age > 70 years old.  The start date was 10/02/2008.  Her last INR was 4.7 ratio.  Anticoagulation responsible provider: Johnsie Cancel MD, Collier Salina.  INR POC: 2.3.  Cuvette Lot#: [id number].  Exp: 03/2011.    Anticoagulation Management Assessment/Plan:      The patient's current anticoagulation dose is Coumadin 5 mg tabs: 1 by mouth daily or as directed.  The target INR is 2.0-3.0.  The next INR is due 03/30/2010.  Anticoagulation instructions were given to patient.  Results were reviewed/authorized by Margaretha Sheffield, PharmD.  She was notified by Ralene Bathe, PharmD Candidate.         Prior Anticoagulation Instructions: INR 2.0  Take 1.5 tablets today, then resume same dosage 1 tablet daily except 1/2 tablet on Mondays.  Recheck in 4 weeks.    Current Anticoagulation Instructions: INR 2.3  Continue Coumadin as scheduled:  1 tablet every day of the week,  except 1/2 tablet on Monday.  Return to clinic in 4 weeks.

## 2010-06-17 NOTE — Miscellaneous (Signed)
Summary: Orders Update  Clinical Lists Changes  Orders: Added new Test order of Venous Duplex Lower Extremity (Venous Duplex Lower) - Signed 

## 2010-06-17 NOTE — Progress Notes (Signed)
Summary: Left leg pain  Phone Note Call from Patient Call back at Work Phone [phone]   Caller: Patient Summary of Call: Patient called with left leg pain. Stated she had a blood clot in May. Has been off coumadin for 3 months. Please advise if needs OV, patient also wanted to know if she should be taking ASA  or start back on coumadin? Initial call taken by: Robin Ewing CMA Deborra Medina),  June 02, 2010 4:00 PM  Follow-up for Phone Call        yes, pt should be on asa 325 as per ov 03/2010 when coumadin was stopped - will order a doppler to look for any recurrent clot - pcc will arrange - no coumadin unless we find a new clot Follow-up by: Rowe Clack MD,  June 02, 2010 4:25 PM  Additional Follow-up for Phone Call Additional follow up Details #1::        Notified pt with md response Additional Follow-up by: Tomma Lightning RMA,  June 02, 2010 4:32 PM  New Problems: LEG PAIN, LEFT (ICD-729.5)   New Problems: LEG PAIN, LEFT (ICD-729.5)

## 2010-06-18 NOTE — Letter (Signed)
Summary: CMN/Second to Sharol Roussel  CMN/Second to [employer]   Imported By: Phillis Knack 03/18/2010 08:34:12  _____________________________________________________________________  External Attachment:    Type:   Image     Comment:   External Document

## 2010-06-25 ENCOUNTER — Ambulatory Visit: Payer: Self-pay | Admitting: Pulmonary Disease

## 2010-06-30 ENCOUNTER — Telehealth: Payer: Self-pay | Admitting: Internal Medicine

## 2010-07-01 ENCOUNTER — Encounter: Payer: Self-pay | Admitting: Internal Medicine

## 2010-07-07 NOTE — Progress Notes (Signed)
Summary: ?  Phone Note Call from Patient Call back at Home Phone [phone] Call back at Work Phone [phone]   Caller: Patient Summary of Call: Pt left message on triage stating that she thinks she has lower abdominal pain and discomfort and would like the number to the OB/GYN Dr. Asa Lente referred her to last year-pt thinks she may have UTI or YI. Pt requested either an ATB or to schedule OV. Initial call taken by: Rebeca Alert CMA Deborra Medina),  June 30, 2010 2:25 PM  Follow-up for Phone Call        left detailed message on pt's VM to callback office to schedule OV with VAL for eval of sxs and also left the number of OB/GYN pt was referred to last year Follow-up by: Rebeca Alert CMA Deborra Medina),  June 30, 2010 2:29 PM

## 2010-07-13 NOTE — Letter (Signed)
Summary: Order & Medication Summary / MedLink   Order & Medication Summary / MedLink   Imported By: Rise Patience 07/05/2010 16:41:43  _____________________________________________________________________  External Attachment:    Type:   Image     Comment:   External Document

## 2010-07-14 ENCOUNTER — Encounter: Payer: Self-pay | Admitting: Pulmonary Disease

## 2010-07-14 ENCOUNTER — Ambulatory Visit (INDEPENDENT_AMBULATORY_CARE_PROVIDER_SITE_OTHER): Payer: Commercial Managed Care - PPO | Admitting: Pulmonary Disease

## 2010-07-14 DIAGNOSIS — G4733 Obstructive sleep apnea (adult) (pediatric): Secondary | ICD-10-CM

## 2010-07-22 NOTE — Assessment & Plan Note (Signed)
Summary: rov for osa   Copy to:  Gwendolyn Grant Primary Provider/Referring Provider:  Rowe Clack MD  CC:  1 year f/u appt for OSA.   States she is wearing her cpap machine every night.  Approx 4 hours per night.  Pt denied any complaints with mask or pressure.  Pt c/o feeling "tired throughout the day and then difficulty falling asleep at night."  .  History of Present Illness: the pt comes in today for f/u of her known osa.  She has been using cpap compliantly, but still is not getting enough sleep.  She will sometime catnap in the evening, or go to bed very early then awaken in the middle of the night with inability to return to sleep.  She denies any issues with cpap pressure, but from her description may not be changing mask cushions often enough.  She is not resting as well, and has daytime sleepiness.  However, she has gained 17 pounds since her last pressure adjustment, and will probably need higher cpap pressures.    Current Medications (verified): 1)  Proair Hfa 108 (90 Base) Mcg/act Aers (Albuterol Sulfate) .... Take 2 Puffs Q 4 Hours Prn 2)  Norvasc 10 Mg Tabs (Amlodipine Besylate) .... Take 1 By Mouth Qd 3)  Coreg 25 Mg Tabs (Carvedilol) .... Take 1 Two Times A Day 4)  Cozaar 100 Mg Tabs (Losartan Potassium) .... Take 1 By Mouth Qd 5)  Lisinopril 20 Mg Tabs (Lisinopril) .... Take 1 Tablet By Mouth Once A Day 6)  Pravastatin Sodium 40 Mg Tabs (Pravastatin Sodium) .... Take 1 At Bedtime 7)  Furosemide 20 Mg Tabs (Furosemide) .... Take 1 By Mouth Once Daily As Needed For Swelling 8)  Ferrex 150 150 Mg Caps (Polysaccharide Iron Complex) .... Take 1 By Mouth Once Daily 9)  Truetrack Blood Glucose  Devi (Blood Glucose Monitoring Suppl) .... Use As Directed  Dx 250.00 10)  Humalog Kwikpen 100 Unit/ml Soln (Insulin Lispro (Human)) .[name].. 10 Units Three Times A Day (Just Before Each Meal) + 3 Units If Cbg >150 (Or As Directed), and Pen Needles 5x A Day 11)  Lantus Solostar 100  Unit/ml Soln (Insulin Glargine) .... 30 Units Two Times A Day 12)  Onetouch Ultra Mini W/device Kit (Blood Glucose Monitoring Suppl) .... Use As Directed 13)  Onetouch Ultra Blue  Strp (Glucose Blood) .... Use Check Blood Sugar Three Times A Day 14)  Onetouch Lancets  Misc (Lancets) .... Use Three Times A Day 15)  Aspirin 325 Mg Tabs (Aspirin) .[name].. 1 By Mouth Once Daily 16)  Alcohol Preps  Pads (Alcohol Swabs) .... Use As Directed  Allergies (verified): No Known Drug Allergies  Review of Systems       The patient complains of shortness of breath with activity, tooth/dental problems, and hand/feet swelling.  The patient denies shortness of breath at rest, productive cough, non-productive cough, coughing up blood, chest pain, irregular heartbeats, acid heartburn, indigestion, loss of appetite, weight change, abdominal pain, difficulty swallowing, sore throat, headaches, nasal congestion/difficulty breathing through nose, sneezing, itching, ear ache, anxiety, depression, joint stiffness or pain, rash, change in color of mucus, and fever.    Vital Signs:  Patient profile:   60 year old female Height:      62 inches Weight:      238 pounds BMI:     43.69 O2 Sat:      100 % on Room air Temp:     98.1 degrees F oral Pulse  rate:   73 / minute BP sitting:   120 / 66  (left arm) Cuff size:   large  Vitals Entered By: Matthew Folks LPN (February 29, [id number] 10:49 AM)  O2 Flow:  Room air CC: 1 year f/u appt for OSA.   States she is wearing her cpap machine every night.  Approx 4 hours per night.  Pt denied any complaints with mask or pressure.  Pt c/o feeling "tired throughout the day and then difficulty falling asleep at night."   Comments Medications reviewed with patient Matthew Folks LPN  February 29, [id number] 10:51 AM    Physical Exam  General:  obese female in nad  Nose:  no skin breakdown or pressure necrosis from cpap mask Extremities:  mild LE edema, no cyanosis  Neurologic:  alert,  does not appear sleepy currently, moves all 4    Impression & Recommendations:  Problem # 1:  OBSTRUCTIVE SLEEP APNEA (ICD-327.23) the pt is wearing cpap compliantly, but is still not getting enough sleep.  She has gained 17 pounds since the last visit, and therefore her pressure needs have likely increased.  I have asked her to work on better sleep hygiene, as well as weight loss.  Will re-optimize her pressure on auto for the next 2 weeks, and will also have dme work with her on getting cushions changed on her mask at regular intervals.    Medications Added to Medication List This Visit: 1)  Lisinopril 20 Mg Tabs (Lisinopril) .... Take 1 tablet by mouth once a day  Other Orders: Est. Patient Level III [id number]) DME Referral (DME)  Patient Instructions: 1)  will re-optimize your cpap pressure for you on auto, and will call with results. 2)  work on weight loss 3)  will see how often you can change cushions on cpap mask, and be sure and keep up with this. 4)  followup with me in one year, but call if not sleeping better once we get your pressure adjusted.

## 2010-08-03 LAB — CBC
HCT: 33 % — ABNORMAL LOW (ref 36.0–46.0)
Hemoglobin: 11.6 g/dL — ABNORMAL LOW (ref 12.0–15.0)
MCV: 88 fL (ref 78.0–100.0)
RBC: 3.74 MIL/uL — ABNORMAL LOW (ref 3.87–5.11)
RBC: 4.25 MIL/uL (ref 3.87–5.11)
RDW: 13.3 % (ref 11.5–15.5)
WBC: 4.5 10*3/uL (ref 4.0–10.5)
WBC: 5.3 10*3/uL (ref 4.0–10.5)

## 2010-08-03 LAB — COMPREHENSIVE METABOLIC PANEL
ALT: 20 U/L (ref 0–35)
ALT: 27 U/L (ref 0–35)
AST: 26 U/L (ref 0–37)
AST: 55 U/L — ABNORMAL HIGH (ref 0–37)
Albumin: 3.2 g/dL — ABNORMAL LOW (ref 3.5–5.2)
Alkaline Phosphatase: 132 U/L — ABNORMAL HIGH (ref 39–117)
CO2: 27 mEq/L (ref 19–32)
Calcium: 9 mg/dL (ref 8.4–10.5)
Chloride: 104 mEq/L (ref 96–112)
Chloride: 93 mEq/L — ABNORMAL LOW (ref 96–112)
Creatinine, Ser: 2.12 mg/dL — ABNORMAL HIGH (ref 0.4–1.2)
Creatinine, Ser: 2.37 mg/dL — ABNORMAL HIGH (ref 0.4–1.2)
GFR calc Af Amer: 25 mL/min — ABNORMAL LOW (ref >60)
GFR calc Af Amer: 29 mL/min — ABNORMAL LOW (ref >60)
GFR calc non Af Amer: 21 mL/min — ABNORMAL LOW (ref >60)
Potassium: 6.2 mEq/L — ABNORMAL HIGH (ref 3.5–5.1)
Sodium: 127 mEq/L — ABNORMAL LOW (ref 135–145)
Sodium: 134 mEq/L — ABNORMAL LOW (ref 135–145)
Total Bilirubin: 1.7 mg/dL — ABNORMAL HIGH (ref 0.3–1.2)

## 2010-08-03 LAB — PROTIME-INR
INR: 2.99 — ABNORMAL HIGH (ref 0.00–1.49)
Prothrombin Time: 30.5 seconds — ABNORMAL HIGH (ref 11.6–15.2)

## 2010-08-03 LAB — CARDIAC PANEL(CRET KIN+CKTOT+MB+TROPI)
CK, MB: 1 ng/mL (ref 0.3–4.0)
Relative Index: 0.3 (ref 0.0–2.5)
Relative Index: 0.3 (ref 0.0–2.5)
Total CK: 296 U/L — ABNORMAL HIGH (ref 7–177)
Total CK: 328 U/L — ABNORMAL HIGH (ref 7–177)
Troponin I: 0.03 ng/mL (ref 0.00–0.06)

## 2010-08-03 LAB — URINALYSIS, ROUTINE W REFLEX MICROSCOPIC
Bilirubin Urine: NEGATIVE
Ketones, ur: NEGATIVE mg/dL
Leukocytes, UA: NEGATIVE
Nitrite: NEGATIVE
Protein, ur: 100 mg/dL — AB

## 2010-08-03 LAB — HEMOGLOBIN A1C
Hgb A1c MFr Bld: 12.5 % — ABNORMAL HIGH (ref <5.7)
Mean Plasma Glucose: 303 mg/dL — ABNORMAL HIGH (ref <117)
Mean Plasma Glucose: 312 mg/dL — ABNORMAL HIGH (ref <117)

## 2010-08-03 LAB — POCT I-STAT, CHEM 8
BUN: 32 mg/dL — ABNORMAL HIGH (ref 6–23)
Creatinine, Ser: 2.4 mg/dL — ABNORMAL HIGH (ref 0.4–1.2)
Hemoglobin: 12.6 g/dL (ref 12.0–15.0)
Potassium: 4.7 mEq/L (ref 3.5–5.1)
Sodium: 132 mEq/L — ABNORMAL LOW (ref 135–145)
TCO2: 26 mmol/L (ref 0–100)

## 2010-08-03 LAB — LIPASE, BLOOD: Lipase: 38 U/L (ref 11–59)

## 2010-08-03 LAB — GLUCOSE, CAPILLARY
Glucose-Capillary: 158 mg/dL — ABNORMAL HIGH (ref 70–99)
Glucose-Capillary: 162 mg/dL — ABNORMAL HIGH (ref 70–99)
Glucose-Capillary: 188 mg/dL — ABNORMAL HIGH (ref 70–99)
Glucose-Capillary: 188 mg/dL — ABNORMAL HIGH (ref 70–99)
Glucose-Capillary: 207 mg/dL — ABNORMAL HIGH (ref 70–99)
Glucose-Capillary: 273 mg/dL — ABNORMAL HIGH (ref 70–99)
Glucose-Capillary: 339 mg/dL — ABNORMAL HIGH (ref 70–99)
Glucose-Capillary: 382 mg/dL — ABNORMAL HIGH (ref 70–99)
Glucose-Capillary: 508 mg/dL — ABNORMAL HIGH (ref 70–99)
Glucose-Capillary: 510 mg/dL — ABNORMAL HIGH (ref 70–99)
Glucose-Capillary: 571 mg/dL (ref 70–99)

## 2010-08-03 LAB — DIFFERENTIAL
Basophils Absolute: 0 10*3/uL (ref 0.0–0.1)
Eosinophils Absolute: 0.1 10*3/uL (ref 0.0–0.7)
Eosinophils Relative: 2 % (ref 0–5)
Monocytes Absolute: 0.4 10*3/uL (ref 0.1–1.0)

## 2010-08-03 LAB — BASIC METABOLIC PANEL
Calcium: 9.4 mg/dL (ref 8.4–10.5)
GFR calc Af Amer: 26 mL/min — ABNORMAL LOW (ref >60)
GFR calc non Af Amer: 22 mL/min — ABNORMAL LOW (ref >60)
Glucose, Bld: 261 mg/dL — ABNORMAL HIGH (ref 70–99)
Potassium: 3.9 mEq/L (ref 3.5–5.1)
Sodium: 134 mEq/L — ABNORMAL LOW (ref 135–145)

## 2010-08-03 LAB — KETONES, QUALITATIVE: Acetone, Bld: NEGATIVE

## 2010-08-18 ENCOUNTER — Ambulatory Visit: Payer: Commercial Managed Care - PPO | Admitting: *Deleted

## 2010-08-24 LAB — CBC
HCT: 32.4 % — ABNORMAL LOW (ref 36.0–46.0)
HCT: 33 % — ABNORMAL LOW (ref 36.0–46.0)
MCHC: 34.4 g/dL (ref 30.0–36.0)
MCHC: 34.7 g/dL (ref 30.0–36.0)
MCV: 90.2 fL (ref 78.0–100.0)
Platelets: 209 10*3/uL (ref 150–400)
Platelets: 244 10*3/uL (ref 150–400)
RBC: 3.09 MIL/uL — ABNORMAL LOW (ref 3.87–5.11)
RBC: 3.66 MIL/uL — ABNORMAL LOW (ref 3.87–5.11)
WBC: 4.9 10*3/uL (ref 4.0–10.5)
WBC: 6.2 10*3/uL (ref 4.0–10.5)
WBC: 6.2 10*3/uL (ref 4.0–10.5)

## 2010-08-24 LAB — COMPREHENSIVE METABOLIC PANEL
ALT: 11 U/L (ref 0–35)
AST: 15 U/L (ref 0–37)
Albumin: 2.9 g/dL — ABNORMAL LOW (ref 3.5–5.2)
CO2: 23 mEq/L (ref 19–32)
Calcium: 9.3 mg/dL (ref 8.4–10.5)
Chloride: 109 mEq/L (ref 96–112)
GFR calc Af Amer: 41 mL/min — ABNORMAL LOW (ref >60)
GFR calc non Af Amer: 34 mL/min — ABNORMAL LOW (ref >60)
Sodium: 138 mEq/L (ref 135–145)

## 2010-08-24 LAB — CK TOTAL AND CKMB (NOT AT ARMC)
CK, MB: 1.2 ng/mL (ref 0.3–4.0)
CK, MB: 1.4 ng/mL (ref 0.3–4.0)
CK, MB: 1.7 ng/mL (ref 0.3–4.0)
Relative Index: 0.7 (ref 0.0–2.5)
Total CK: 165 U/L (ref 7–177)
Total CK: 175 U/L (ref 7–177)
Total CK: 194 U/L — ABNORMAL HIGH (ref 7–177)

## 2010-08-24 LAB — BASIC METABOLIC PANEL
BUN: 21 mg/dL (ref 6–23)
GFR calc Af Amer: 41 mL/min — ABNORMAL LOW (ref >60)
GFR calc non Af Amer: 34 mL/min — ABNORMAL LOW (ref >60)
Potassium: 4.2 mEq/L (ref 3.5–5.1)
Sodium: 138 mEq/L (ref 135–145)

## 2010-08-24 LAB — POCT I-STAT, CHEM 8
BUN: 25 mg/dL — ABNORMAL HIGH (ref 6–23)
Chloride: 106 mEq/L (ref 96–112)
Potassium: 4.1 mEq/L (ref 3.5–5.1)
Sodium: 141 mEq/L (ref 135–145)
TCO2: 24 mmol/L (ref 0–100)

## 2010-08-24 LAB — DIFFERENTIAL
Eosinophils Absolute: 0.1 10*3/uL (ref 0.0–0.7)
Eosinophils Relative: 2 % (ref 0–5)
Lymphs Abs: 1.4 10*3/uL (ref 0.7–4.0)
Monocytes Absolute: 0.4 10*3/uL (ref 0.1–1.0)
Monocytes Relative: 7 % (ref 3–12)

## 2010-08-24 LAB — PROTIME-INR: INR: 1.1 (ref 0.00–1.49)

## 2010-08-24 LAB — LIPID PANEL
HDL: 32 mg/dL — ABNORMAL LOW (ref >39)
Total CHOL/HDL Ratio: 5.2 RATIO

## 2010-08-24 LAB — HEPARIN LEVEL (UNFRACTIONATED)
Heparin Unfractionated: 0.35 IU/mL (ref 0.30–0.70)
Heparin Unfractionated: 0.38 IU/mL (ref 0.30–0.70)

## 2010-08-24 LAB — HEMOGLOBIN A1C
Hgb A1c MFr Bld: 5.7 % (ref 4.6–6.1)
Mean Plasma Glucose: 117 mg/dL

## 2010-08-24 LAB — POCT CARDIAC MARKERS
CKMB, poc: <1 ng/mL — ABNORMAL LOW (ref 1.0–8.0)
Troponin i, poc: 0.08 ng/mL (ref 0.00–0.09)

## 2010-08-24 LAB — PHOSPHORUS: Phosphorus: 3.1 mg/dL (ref 2.3–4.6)

## 2010-08-25 LAB — POCT I-STAT, CHEM 8
BUN: 23 mg/dL (ref 6–23)
Chloride: 107 mEq/L (ref 96–112)
Creatinine, Ser: 2 mg/dL — ABNORMAL HIGH (ref 0.4–1.2)
Potassium: 4.2 mEq/L (ref 3.5–5.1)
Sodium: 140 mEq/L (ref 135–145)
TCO2: 26 mmol/L (ref 0–100)

## 2010-08-27 ENCOUNTER — Telehealth: Payer: Self-pay | Admitting: Pulmonary Disease

## 2010-08-27 DIAGNOSIS — G4733 Obstructive sleep apnea (adult) (pediatric): Secondary | ICD-10-CM

## 2010-08-27 NOTE — Telephone Encounter (Signed)
Pt returned call from triage. Patricia Morgan

## 2010-08-27 NOTE — Telephone Encounter (Signed)
Order sent to [facility]

## 2010-08-27 NOTE — Telephone Encounter (Signed)
Called pt's cell # - [email or web address]

## 2010-08-27 NOTE — Telephone Encounter (Signed)
lmomtccb x 1

## 2010-08-27 NOTE — Telephone Encounter (Signed)
Pt states she needs a new cpap machine. Her machine is 21 to 62 years old. DME is [facility]. Pls advise.

## 2010-08-30 NOTE — Telephone Encounter (Signed)
Called and spoke w/ pt and advised her order was sent for her to get a new machine and someone should be contacting her. Pt verbalized understanding

## 2010-09-23 ENCOUNTER — Encounter: Payer: Self-pay | Admitting: Internal Medicine

## 2010-09-23 DIAGNOSIS — M79609 Pain in unspecified limb: Secondary | ICD-10-CM

## 2010-09-28 ENCOUNTER — Other Ambulatory Visit (INDEPENDENT_AMBULATORY_CARE_PROVIDER_SITE_OTHER): Payer: 59

## 2010-09-28 ENCOUNTER — Other Ambulatory Visit (INDEPENDENT_AMBULATORY_CARE_PROVIDER_SITE_OTHER): Payer: 59 | Admitting: Internal Medicine

## 2010-09-28 ENCOUNTER — Encounter: Payer: Self-pay | Admitting: Internal Medicine

## 2010-09-28 ENCOUNTER — Ambulatory Visit (INDEPENDENT_AMBULATORY_CARE_PROVIDER_SITE_OTHER): Payer: 59 | Admitting: Internal Medicine

## 2010-09-28 ENCOUNTER — Other Ambulatory Visit: Payer: Self-pay | Admitting: Internal Medicine

## 2010-09-28 VITALS — BP 118/72 | HR 76 | Temp 98.4°F | Ht 62.0 in | Wt 229.8 lb

## 2010-09-28 DIAGNOSIS — E119 Type 2 diabetes mellitus without complications: Secondary | ICD-10-CM

## 2010-09-28 DIAGNOSIS — Z0000 Encounter for general adult medical examination without abnormal findings: Secondary | ICD-10-CM

## 2010-09-28 DIAGNOSIS — Z23 Encounter for immunization: Secondary | ICD-10-CM

## 2010-09-28 DIAGNOSIS — E785 Hyperlipidemia, unspecified: Secondary | ICD-10-CM

## 2010-09-28 DIAGNOSIS — I10 Essential (primary) hypertension: Secondary | ICD-10-CM

## 2010-09-28 DIAGNOSIS — Z2911 Encounter for prophylactic immunotherapy for respiratory syncytial virus (RSV): Secondary | ICD-10-CM

## 2010-09-28 DIAGNOSIS — G4733 Obstructive sleep apnea (adult) (pediatric): Secondary | ICD-10-CM

## 2010-09-28 LAB — LIPID PANEL
Cholesterol: 160 mg/dL (ref 0–200)
Triglycerides: 81 mg/dL (ref 0.0–149.0)

## 2010-09-28 LAB — URINALYSIS, ROUTINE W REFLEX MICROSCOPIC
Hgb urine dipstick: NEGATIVE
Nitrite: NEGATIVE
Urobilinogen, UA: 0.2 (ref 0.0–1.0)

## 2010-09-28 LAB — CBC WITH DIFFERENTIAL/PLATELET
Basophils Absolute: 0 10*3/uL (ref 0.0–0.1)
Eosinophils Absolute: 0.1 10*3/uL (ref 0.0–0.7)
Hemoglobin: 11.4 g/dL — ABNORMAL LOW (ref 12.0–15.0)
Lymphocytes Relative: 37.6 % (ref 12.0–46.0)
MCHC: 34.5 g/dL (ref 30.0–36.0)
Neutro Abs: 2.1 10*3/uL (ref 1.4–7.7)
Neutrophils Relative %: 49.4 % (ref 43.0–77.0)
Platelets: 225 10*3/uL (ref 150.0–400.0)
RDW: 13.9 % (ref 11.5–14.6)

## 2010-09-28 LAB — HEPATIC FUNCTION PANEL
ALT: 19 U/L (ref 0–35)
AST: 20 U/L (ref 0–37)
Albumin: 3.4 g/dL — ABNORMAL LOW (ref 3.5–5.2)
Alkaline Phosphatase: 81 U/L (ref 39–117)
Total Protein: 6.6 g/dL (ref 6.0–8.3)

## 2010-09-28 LAB — BASIC METABOLIC PANEL
Calcium: 9.4 mg/dL (ref 8.4–10.5)
Creatinine, Ser: 1.8 mg/dL — ABNORMAL HIGH (ref 0.4–1.2)
GFR: 36.03 mL/min — ABNORMAL LOW (ref >60.00)
Sodium: 141 mEq/L (ref 135–145)

## 2010-09-28 MED ORDER — LISINOPRIL 10 MG PO TABS: 10.0000 mg | ORAL_TABLET | Freq: Every day | ORAL | Status: DC

## 2010-09-28 NOTE — Assessment & Plan Note (Signed)
The current medical regimen is effective;  continue present plan and medications. BP Readings from Last 3 Encounters:  09/28/10 118/72  07/14/10 120/66  03/30/10 110/78

## 2010-09-28 NOTE — H&P (Signed)
NAME:  Patricia Morgan, Patricia Morgan             ACCOUNT NO.:  [id number]   MEDICAL RECORD NO.:  [id number]          PATIENT TYPE:  INP   LOCATION:  [id number]                         FACILITY:  [facility]   PHYSICIAN:  Barbette Merino, M.D.      DATE OF BIRTH:  11/10/1950   DATE OF ADMISSION:  09/23/2008  DATE OF DISCHARGE:                              HISTORY & PHYSICAL   PRIMARY CARE PHYSICIAN:  The patient is unassigned to [place].   PRESENTING COMPLAINT:  Shortness of breath and chest pain.   HISTORY OF PRESENT ILLNESS:  The patient is a 60 year old female with  known history of left breast cancer, status post mastectomy, also  diabetes and hypertension, who presented with increasing shortness of  breath for a few days.  The patient has had swelling of her lower  extremities bilaterally back in April and was seen in the emergency room  at the time.  No obvious cause of her symptoms was found back then.  She  was sent home with basic treatments.  She presented now with increasing  pain in her lower extremity, as well as shortness of breath.  She denied  any cough.  She denied any hemoptysis.  She has some pain, but mild and  more of discomfort.  No fever.  No recent sick contacts.   PAST MEDICAL HISTORY:  1. Diabetes.  2. Hypertension.  3. History of breast cancer, status post left mastectomy.  4. Dyslipidemia.  5. Chronic kidney disease.  6. Anemia of chronic disease.   ALLERGIES:  NO KNOWN DRUG ALLERGIES.   MEDICATIONS:  1. Amlodipine 10 mg daily.  2. Carvedilol 25 mg p.o. b.i.d.  3. Cozaar 100 mg daily.  4. Lasix 40 mg b.i.d.  5. Lisinopril 20 mg b.i.d.  6. Pravastatin 40 mg daily.   SOCIAL HISTORY:  The patient lives in [place].  She denied tobacco,  alcohol or IV drug use.   FAMILY HISTORY:  Noncontributory.   REVIEW OF SYSTEMS:  A 14-point review of systems was negative except per  HPI.   PHYSICAL EXAMINATION:  VITAL SIGNS:  Temperature 98, blood pressure  157/74, pulse 56, respiratory  rate 21.  Her saturations initially 82% on  room air, currently 98% on 2 liters.  GENERAL:  The patient is awake, alert, oriented, pleasant in no acute  distress.  HEENT:  PERRL.  EOMI.  NECK:  Supple.  No JVD, no lymphadenopathy.  RESPIRATORY:  The patient has good air entry bilaterally.  No wheeze, no  rales.  She has visible scar from her previous mastectomy.  CARDIOVASCULAR:  Shows S1-S2.  No murmur.  ABDOMEN:  Soft, nontender with positive bowel sound.  EXTREMITIES:  Show no edema, cyanosis or clubbing.   LABORATORY DATA:  White count is 6.2, hemoglobin 11.4, platelet count  239 with normal differentials.  Initial cardiac markers are negative.  D-  dimer is 10.95.  Sodium is 138, potassium 4.2, chloride 105, CO2 of 25,  glucose 153, BUN 21, creatinine 1.56 with a calcium 9.8.   Chest x-ray shows low lung volumes with crowding of pulmonary  vasculature.  There  was faintly accentuated interstitial of the right  lung base, but that seems to be chronic.  VQ scan showed high  probability, more than 90% of pulmonary embolus in the right upper  lobes.   ASSESSMENT:  Therefore, this is a 60 year old female with past history  of breast cancer, bilateral lower extremity swelling, some pain and high  probably for pulmonary embolism.  The patient had hypoxia, although she  was not tachycardiac.  However, she is on beta blocker which may explain  why.  Otherwise, all the other features are consistent with pulmonary  embolus, which now will explain possibly why she had has current  symptoms.   PLAN:  1. Pulmonary embolus.  We will admit the patient and start her on both      heparin and Coumadin concurrently.  Follow her INR.  We may switch      her to Lovenox if she is able to get it as an outpatient.  2.      Diabetes.  We will check a hemoglobin A1c, put her on sliding scale      insulin.  She is on diet control, but we will put her on sliding      scale and watch her closely.  2.  Hypertension.  Again, we will resume her home medicine as much as      possible.  3. Dyslipidemia.  I will continue with her home medication.  4. Chronic kidney disease.  Her baseline creatinine has been ranging      between 1.6-2.  We will keep an eye on it while she is in the      hospital.  5. Anemia of chronic disease.  Again, this is chronic and we will      watch it closely.  Otherwise, further treatment will depend on how      the patient does in the hospital.      Barbette Merino, M.D.  Electronically Signed     LG/MEDQ  D:  09/24/2008  T:  09/24/2008  Job:  [id number]

## 2010-09-28 NOTE — Assessment & Plan Note (Signed)
[facility] 09/2009 - now follows with endo Weight loss commended, pt reports improved cbgs Check a1c now, The current medical regimen is effective;  continue present plan and medications.

## 2010-09-28 NOTE — Assessment & Plan Note (Signed)
Follows with renal - CrCl stable - recheck today Lab Results  Component Value Date   CREATININE 1.6* 10/02/2009

## 2010-09-28 NOTE — Assessment & Plan Note (Signed)
Lipids pending with cpx labs - on pravastain The current medical regimen is effective;  continue present plan and medications.

## 2010-09-28 NOTE — Discharge Summary (Signed)
NAMESAMIJO, GOVERN             ACCOUNT NO.:  [id number]   MEDICAL RECORD NO.:  [id number]          PATIENT TYPE:  INP   LOCATION:  [id number]                         FACILITY:  [facility]   PHYSICIAN:  Helen Hashimoto, MD    DATE OF BIRTH:  11/27/1950   DATE OF ADMISSION:  09/24/2008  DATE OF DISCHARGE:  09/25/2008                               DISCHARGE SUMMARY   DISCHARGE DIAGNOSES:  1. Pulmonary embolism.  2. Hypoxemia related to pulmonary embolism that resolved and currently      the patient is saturating well on room air.  3. Hypertension.  4. Dyslipidemia.  5. Chronic kidney disease with creatinine baseline 1.6 to 2.  6. Anemia of chronic kidney disease.  7. Diabetes mellitus.  8. History of breast cancer status post left mastectomy.   NEW MEDICATIONS:  1. Coumadin 5 mg p.o. once daily and the patient to check INR with her      primary doctor within 2-3 days.  2. Lovenox 90 mg subcutaneously twice daily for 5 days.  3. Albuterol 2 puffs q.4 h. as needed.   OLD MEDICATIONS:  1. Norvasc 10 mg once a day.  2. Coreg 25 mg p.o. twice daily.  3. Cozaar 100 mg p.o. once daily.  4. Lasix 40 mg p.o. twice daily.  5. Lisinopril 20 mg p.o. twice daily.  6. Pravastatin 40 mg p.o. at bedtime.   CONSULTATIONS:  None.   PROCEDURE:  None.   DIAGNOSTIC STUDIES:  1. Chest x-ray on Sep 23, 2008, showed low lung volumes without acute      problem.  2. VQ scan on Sep 23, 2008, showed high probability greater than 90%      for pulmonary embolism.   COURSE OF HOSPITALIZATION:  1. Pulmonary embolism.  This patient presented to the hospital      initially complaining of shortness of breath and chest pain.  Her D-      dimers came to be elevated.  VQ scan was done and showed high      probability for PE.  The patient was admitted to the hospital where      she was started on heparin drip.  She was put on oxygen and she was      treated symptomatically with pain medicine and breathing  treatment.      This patient improved very quick during this hospitalization.  Her      oxygen was discontinued, saturation remains above 95%.  At the time      of discharge, she is totally asymptomatic, she does not have any      chest pain, she does not have any shortness of breath.  I discussed      different options with her regarding keeping her in the hospital to      complete full overlap between Coumadin and heparin or sending her      home on Coumadin and Lovenox.  The patient preferred to go home.  I      found this is reasonable because she has been asymptomatic and she      has  been off the oxygen.  I will discharge her home on Coumadin and      Lovenox and I instructed her to follow her doctor within 2-3 days      to check her INR and to adjust her Coumadin accordingly.  I also      told her to come to the ER if she developed more chest pain or      shortness of breath or any evidence of bleeding from any site  2. Hypoxemia that was related to PE.  As mentioned above, the patient      received oxygen and then it was tapered off and her saturation      remained in mid to high 90s in room air.   Otherwise, other medical conditions remained stable during this  hospitalization and all of her preadmission medications were continued.   Total assessment time is 40 minutes.      Helen Hashimoto, MD  Electronically Signed     NAE/MEDQ  D:  09/25/2008  T:  09/25/2008  Job:  [id number]

## 2010-09-28 NOTE — Progress Notes (Signed)
Subjective:    Patient ID: Patricia Morgan, female    DOB: [birth year], 60 y.o.   MRN: [id number]  HPI patient is here today for annual physical. Patient feels well and has no complaints.  Also reviewed chronic medical issues:  DM2 - hx [facility] 09/2009 - prev diet controlled - follows now with endo (balan) - now on insulin - home log reviewed - checking cbgs 3-4x/d - no hypoglycemic events or symptoms - resolved PU/PD-  HTN - no CP or HA, no vision change or lightheaded symptoms - reports compliance with ongoing medical treatment and no changes in medication dose or frequency. denies adverse side effects related to current therapy.   CKD 3-  Follows with renal for same- edema is better than usual - no confusion or twitching -   PE hx 09/2008 - on anticoag x 18 months; - no CP or SOB, no leg swelling - denies weight loss - no recurrent cancer (breast 1995 w/o recurrence), no FH clots or prior DVT hx - wants to stop coumadin    Past Medical History  Diagnosis Date  . OBSTRUCTIVE SLEEP APNEA   . PULMONARY EMBOLISM 09/24/2008    anticoag thru 03/2010  . Proteinuria   . BREAST CANCER, HX OF 1995  . ANEMIA-NOS   . DIABETES MELLITUS, TYPE II   . HYPERLIPIDEMIA   . HYPERTENSION   . CHRONIC KIDNEY DISEASE STAGE III (MODERATE)    Family History  Problem Relation Age of Onset  . Diabetes Sister    History  Substance Use Topics  . Smoking status: Never Smoker   . Smokeless tobacco: Not on file   Comment: Lives alone-divorced  . Alcohol Use: No    Review of Systems  Constitutional: Negative for fever.  Respiratory: Negative for cough and shortness of breath.   Cardiovascular: Negative for chest pain.  Gastrointestinal: Negative for abdominal pain.  Musculoskeletal: Negative for gait problem.  Skin: Negative for rash.  Neurological: Negative for dizziness.  No other specific complaints in a complete review of systems (except as listed in HPI above).     Objective:   Physical  Exam BP 118/72  Pulse 76  Temp(Src) 98.4 F (36.9 C) (Oral)  Ht 5\' 2"  (1.575 m)  Wt 229 lb 12.8 oz (104.237 kg)  BMI 42.03 kg/m2  SpO2 98% Wt Readings from Last 3 Encounters:  09/28/10 229 lb 12.8 oz (104.237 kg)  07/14/10 238 lb (107.956 kg)  03/30/10 238 lb (107.956 kg)   Physical Exam  Constitutional: She is oriented to person, place, and time. She appears well-developed and well-nourished. No distress.  HENT: Head: Normocephalic and atraumatic. Ears; B TMs ok, no erythema or effusion; Nose: Nose normal.  Mouth/Throat: Oropharynx is clear and moist. No oropharyngeal exudate.  Eyes: Conjunctivae and EOM are normal. Pupils are equal, round, and reactive to light. No scleral icterus.  Neck: Normal range of motion. Neck supple. No JVD present. No thyromegaly present.  Cardiovascular: Normal rate, regular rhythm and normal heart sounds.  No murmur heard. Trace BLE edema at abkles. Pulmonary/Chest: Effort normal and breath sounds normal. No respiratory distress. She has no wheezes.  Abdominal: Soft. Bowel sounds are normal. She exhibits no distension. There is no tenderness.  Musculoskeletal: Normal range of motion. No gross deformities Neurological: She is alert and oriented to person, place, and time. No cranial nerve deficit. Coordination normal.  Skin: Skin is warm and dry. No rash noted. No erythema.  Psychiatric: She has a normal mood and  affect. Her behavior is normal. Judgment and thought content normal.   Lab Results  Component Value Date   WBC 4.5 09/23/2009   HGB 11.6* 09/23/2009   HCT 33.0* 09/23/2009   PLT 188 09/23/2009   CHOL 183 12/29/2009   TRIG 173.0* 12/29/2009   HDL 44.00 12/29/2009   LDLDIRECT 141.3 10/14/2008   ALT 20 09/24/2009   AST 26 09/24/2009   NA 143 10/02/2009   K 5.2* 10/02/2009   CL 109 10/02/2009   CREATININE 1.6* 10/02/2009   BUN 28* 10/02/2009   CO2 30 10/02/2009   TSH 2.62 10/14/2008   INR 2.99* 09/24/2009   HGBA1C 5.8 12/29/2009        Assessment &  Plan:  CPX - v70.0 - Patient has been counseled on age-appropriate routine health concerns for screening and prevention. These are reviewed and up-to-date. Immunizations are up-to-date or declined. Labs and ECG reviewed: NSR with PR .220  (1st deg AVB)  Also see problem list. Medications and labs reviewed today.

## 2010-09-28 NOTE — Patient Instructions (Signed)
It was good to see you today. Test(s) ordered today. Your results will be called to you after review (48-72hours after test completion). If any changes need to be made, you will be notified at that time. Will also fax copy to endo and renal as requested and provide copy for you to pick up to take to appointments as needed Tdap and Shingles vaccines done today Good work on the weight loss! Keep up the good work! Continue to work on lifestyle changes as discussed (low fat, low carb, increased protein diet; improved exercise efforts; weight loss) to control sugar, blood pressure and cholesterol levels and/or reduce risk of developing other medical problems. Look into http://vang.com/ or other type of food journal to assist you in this process. we'll make referral to GI for colonoscopy screening. Our office will contact you regarding appointment(s) once made. Please schedule followup in 6 months, call sooner if problems.

## 2010-09-28 NOTE — Assessment & Plan Note (Signed)
Home CPAP - follows with pulm for same -  The current medical regimen is effective;  continue present plan and weight loss efforts.

## 2010-10-01 NOTE — Op Note (Signed)
NAME:  Patricia Morgan                        ACCOUNT NO.:  [id number]   MEDICAL RECORD NO.:  [id number]                   PATIENT TYPE:  AMB   LOCATION:  ENDO                                 FACILITY:  [facility]   PHYSICIAN:  Nelwyn Salisbury, M.D.               DATE OF BIRTH:  07/01/1950   DATE OF PROCEDURE:  07/04/2002  DATE OF DISCHARGE:                                 OPERATIVE REPORT   PROCEDURE PERFORMED:  Screening colonoscopy.   ENDOSCOPIST:  Juanita Craver, M.D.   INSTRUMENT USED:  Olympus video colonoscope.   INDICATIONS FOR PROCEDURE:  The patient is a 60 year old African-American  female with a personal history of breast cancer undergoing screening  colonoscopy.  Rule out colonic polyps, masses, etc.   PREPROCEDURE PREPARATION:  Informed consent was procured from the patient.  The patient was fasted for eight hours prior to the procedure and prepped  with a bottle of magnesium citrate and GoLYTELY the night prior to the  procedure.   PREPROCEDURE PHYSICAL:  The patient had stable vital signs.  Neck supple.  Chest clear to auscultation.  S1 and S2 regular.  Abdomen soft with normal  bowel sounds.   DESCRIPTION OF PROCEDURE:  The patient was placed in left lateral decubitus  position and sedated with 60 mg of Demerol and 6 mg of Versed intravenously.  Once the patient was adequately sedated and maintained on low flow oxygen  and continuous cardiac monitoring, the Olympus video colonoscope was  advanced from the rectum to the cecum with difficulty.  The patient's  position had to be changed from the left lateral to the supine and the right  lateral position to reach the cecal base.  The appendicular orifice and  ileocecal valve were identified and photographed.  No masses or polyps were  seen.  There was evidence of a few early left-sided diverticula.  No other  abnormalities were noted on retroflexion.   IMPRESSION:  1. Essentially normal colonoscopy up to the cecum  except for a few early     left-sided diverticula.  2. The patient had a very tortuous colon.  No masses or polyps seen.   RECOMMENDATIONS:  1. A high fiber diet with liberal fluid intake has been advocated and     information on diverticulosis has been handed to her for her education.  2.     Repeat colorectal cancer is recommended in the next five years unless the     patient develops any abnormal symptoms in the interim.  3. Outpatient follow-up on a p.r.n. basis.                                                    Nelwyn Salisbury, M.D.    JNM/MEDQ  D:  07/04/2002  T:  07/04/2002  Job:  [id number]   cc:   Royetta Crochet. Karlton Lemon, M.D.  [address]  [address] [place]  [place] [address]  Fax: [phone]

## 2010-10-05 ENCOUNTER — Other Ambulatory Visit: Payer: Self-pay | Admitting: Internal Medicine

## 2010-10-22 ENCOUNTER — Other Ambulatory Visit: Payer: Self-pay | Admitting: *Deleted

## 2010-10-22 MED ORDER — ALCOHOL PREP 70 % PADS: MEDICATED_PAD | Status: DC

## 2010-10-22 NOTE — Telephone Encounter (Signed)
R'cd fax from [facility] for refill of Alcohol prep pads.

## 2010-10-25 ENCOUNTER — Ambulatory Visit (AMBULATORY_SURGERY_CENTER): Payer: 59 | Admitting: *Deleted

## 2010-10-25 VITALS — Ht 62.0 in | Wt 226.0 lb

## 2010-10-25 DIAGNOSIS — Z1211 Encounter for screening for malignant neoplasm of colon: Secondary | ICD-10-CM

## 2010-10-25 MED ORDER — PEG-KCL-NACL-NASULF-NA ASC-C 100 G PO SOLR: ORAL | Status: DC

## 2010-10-25 NOTE — Progress Notes (Signed)
Pt had colonoscopy 8 years ago at [facility] in [place].  She thinks it was normal. Release of information signed and will give to Randye Lobo, [place].  Ulice Dash

## 2010-10-26 ENCOUNTER — Telehealth: Payer: Self-pay | Admitting: *Deleted

## 2010-10-26 NOTE — Telephone Encounter (Signed)
ROI faxed to get records.

## 2010-10-26 NOTE — Telephone Encounter (Signed)
Previous colonoscopy at [facility] in [place] 8 years ago.  Pt thinks it was normal.  Release of information signed and given to [name].  Pt scheduled for colonoscopy w/ Dr. Henrene Pastor Monday 6/25.  Note: pt was [name] when she had colonoscopy  Patricia Morgan

## 2010-11-02 ENCOUNTER — Telehealth: Payer: Self-pay | Admitting: Internal Medicine

## 2010-11-08 ENCOUNTER — Ambulatory Visit (AMBULATORY_SURGERY_CENTER): Payer: 59 | Admitting: Internal Medicine

## 2010-11-08 ENCOUNTER — Encounter: Payer: Self-pay | Admitting: Internal Medicine

## 2010-11-08 VITALS — BP 156/81 | HR 77 | Resp 18 | Ht 62.4 in | Wt 235.0 lb

## 2010-11-08 DIAGNOSIS — D126 Benign neoplasm of colon, unspecified: Secondary | ICD-10-CM

## 2010-11-08 DIAGNOSIS — K5730 Diverticulosis of large intestine without perforation or abscess without bleeding: Secondary | ICD-10-CM

## 2010-11-08 DIAGNOSIS — Z1211 Encounter for screening for malignant neoplasm of colon: Secondary | ICD-10-CM

## 2010-11-08 LAB — GLUCOSE, CAPILLARY: Glucose-Capillary: 127 mg/dL — ABNORMAL HIGH (ref 70–99)

## 2010-11-08 MED ORDER — SODIUM CHLORIDE 0.9 % IV SOLN: 500.0000 mL | INTRAVENOUS | Status: DC

## 2010-11-09 ENCOUNTER — Telehealth: Payer: Self-pay | Admitting: *Deleted

## 2010-11-09 NOTE — Telephone Encounter (Signed)
No ID on answering machine, no message left.

## 2010-11-30 ENCOUNTER — Telehealth: Payer: Self-pay | Admitting: Internal Medicine

## 2010-11-30 NOTE — Telephone Encounter (Signed)
Forwarded to Dr. Henrene Pastor for Review.

## 2010-12-16 ENCOUNTER — Ambulatory Visit (INDEPENDENT_AMBULATORY_CARE_PROVIDER_SITE_OTHER): Payer: 59 | Admitting: Internal Medicine

## 2010-12-16 ENCOUNTER — Encounter: Payer: Self-pay | Admitting: Internal Medicine

## 2010-12-16 DIAGNOSIS — H6500 Acute serous otitis media, unspecified ear: Secondary | ICD-10-CM

## 2010-12-16 DIAGNOSIS — H9209 Otalgia, unspecified ear: Secondary | ICD-10-CM

## 2010-12-16 MED ORDER — FLUTICASONE PROPIONATE 50 MCG/ACT NA SUSP: 2.0000 | Freq: Every day | NASAL | Status: DC

## 2010-12-16 MED ORDER — HYDROCODONE-ACETAMINOPHEN 5-500 MG PO TABS: 1.0000 | ORAL_TABLET | ORAL | Status: DC | PRN

## 2010-12-16 MED ORDER — AMOXICILLIN-POT CLAVULANATE 875-125 MG PO TABS: 1.0000 | ORAL_TABLET | Freq: Two times a day (BID) | ORAL | Status: DC

## 2010-12-16 MED ORDER — AMOXICILLIN-POT CLAVULANATE 875-125 MG PO TABS: 1.0000 | ORAL_TABLET | Freq: Two times a day (BID) | ORAL | Status: AC

## 2010-12-16 NOTE — Progress Notes (Signed)
  Subjective:     Patricia Morgan is a 60 y.o. female who presents with ear pain and possible ear infection. Symptoms include: right ear pain, plugged sensation in the right ear and tugging at the right ear. Onset of symptoms was 5 days ago, and have been gradually worsening since that time. Associated symptoms include: achiness.  Patient denies: congestion, fever , headache, low grade fever, sore throat and cough. She is drinking plenty of fluids.  The following portions of the patient's history were reviewed and updated as appropriate: allergies, current medications, past family history, past medical history, past social history, past surgical history and problem list.  Review of Systems Pertinent items are noted in HPI.   Objective:    BP 122/72  Pulse 80  Temp(Src) 98.6 F (37 C) (Oral)  Ht 5\' 2"  (1.575 m)  Wt 237 lb 1.9 oz (107.557 kg)  BMI 43.37 kg/m2  SpO2 96% General:  alert and no distress  Right Ear: R TM with erythema and effusion, canal normal, no cerumen  Left Ear: normal  Mouth:  lips, mucosa, and tongue normal; teeth and gums normal  Neck: no adenopathy, no carotid bruit, no JVD, supple, symmetrical, trachea midline and thyroid not enlarged, symmetric, no tenderness/mass/nodules     Assessment:    Right acute otitis media   Plan:    Treatment: Augmentin. Also nasal steroids OTC analgesia as needed. vicodin if severe Fluids, rest, avoid carbonated/alcoholic and caffeinated beverages.  Follow up in 1 week if not improving.

## 2010-12-16 NOTE — Patient Instructions (Signed)
It was good to see you today. Augmentin antibiotics, sudafed, flonase nose spray - also vicodin if needed for ear pain - Your prescription(s) have been submitted to your pharmacy. Please take as directed and contact our office if you believe you are having problem(s) with the medication(s).

## 2010-12-29 NOTE — Progress Notes (Signed)
Addended by: Lowry Ram on: 12/29/2010 11:27 AM   Modules accepted: Level of Service

## 2011-01-03 NOTE — Progress Notes (Signed)
Addended by: Lowry Ram on: 01/03/2011 09:00 AM   Modules accepted: Orders

## 2011-01-04 ENCOUNTER — Other Ambulatory Visit: Payer: Self-pay | Admitting: Endocrinology

## 2011-01-04 ENCOUNTER — Other Ambulatory Visit: Payer: Self-pay | Admitting: Internal Medicine

## 2011-01-07 ENCOUNTER — Other Ambulatory Visit: Payer: Self-pay | Admitting: *Deleted

## 2011-01-07 DIAGNOSIS — E119 Type 2 diabetes mellitus without complications: Secondary | ICD-10-CM

## 2011-01-07 NOTE — Telephone Encounter (Signed)
Pt see Dr. Chalmers Cater for her Diabetes need to send request to him. Fax refill request back to pharmacy with that information...01/07/11@8 :53am/LMB

## 2011-01-18 ENCOUNTER — Other Ambulatory Visit: Payer: Self-pay | Admitting: Internal Medicine

## 2011-01-23 ENCOUNTER — Other Ambulatory Visit: Payer: Self-pay | Admitting: Pulmonary Disease

## 2011-01-23 DIAGNOSIS — G4733 Obstructive sleep apnea (adult) (pediatric): Secondary | ICD-10-CM

## 2011-02-17 ENCOUNTER — Other Ambulatory Visit: Payer: Self-pay | Admitting: *Deleted

## 2011-02-17 MED ORDER — PRAVASTATIN SODIUM 40 MG PO TABS: 40.0000 mg | ORAL_TABLET | Freq: Every day | ORAL | Status: DC

## 2011-02-18 LAB — POCT I-STAT, CHEM 8
BUN: 24 mg/dL — ABNORMAL HIGH (ref 6–23)
Creatinine, Ser: 1.7 mg/dL — ABNORMAL HIGH (ref 0.4–1.2)
Glucose, Bld: 129 mg/dL — ABNORMAL HIGH (ref 70–99)
Hemoglobin: 11.9 g/dL — ABNORMAL LOW (ref 12.0–15.0)
Potassium: 4.1 mEq/L (ref 3.5–5.1)

## 2011-02-18 LAB — INFLUENZA A AND B ANTIGEN (CONVERTED LAB)
Inflenza A Ag: NEGATIVE
Influenza B Ag: NEGATIVE

## 2011-02-28 ENCOUNTER — Other Ambulatory Visit: Payer: Self-pay | Admitting: Internal Medicine

## 2011-02-28 ENCOUNTER — Encounter: Payer: 59 | Attending: Internal Medicine

## 2011-02-28 NOTE — Progress Notes (Signed)
  Patient was seen on 02/28/2011 for the first of a series of three diabetes self-management courses at [facility]. The following learning objectives were met by the patient during this course:   Defines diabetes and the role of insulin  Identifies type of diabetes and pathophysiology  States normal BG range and personal goals  Identifies three risk factors for the development of diabetes  States the need for and frequency of healthcare follow up (ADA Standards of Care)  Handouts given during class include:  [place] Core Class 1 Handout  ADA: What's My Number (A1c handout)  [place] ADA Standards of Care Handout  [facility] Medication Log handout  Lab Results  Component Value Date   HGBA1C 5.8 09/28/2010   HgbA1c per [place] = 6.7% on 02/04/11  Patient has established the following initial goals:  Follow a Diabetes Meal Plan  Increase Exercise Levels  Follow-Up Plan: Pt to attend Core Diabetes Classes at [facility]

## 2011-02-28 NOTE — Patient Instructions (Signed)
Patient will attend Core Diabetes Courses as scheduled or follow up prn.

## 2011-02-28 NOTE — Telephone Encounter (Signed)
Done per emr 

## 2011-03-03 NOTE — Telephone Encounter (Signed)
Records recieved

## 2011-03-28 ENCOUNTER — Other Ambulatory Visit: Payer: Self-pay | Admitting: Endocrinology

## 2011-03-28 ENCOUNTER — Other Ambulatory Visit: Payer: Self-pay | Admitting: Internal Medicine

## 2011-03-29 ENCOUNTER — Ambulatory Visit: Payer: 59

## 2011-04-05 ENCOUNTER — Other Ambulatory Visit: Payer: Self-pay | Admitting: Internal Medicine

## 2011-04-05 ENCOUNTER — Ambulatory Visit: Payer: 59

## 2011-04-19 ENCOUNTER — Other Ambulatory Visit: Payer: Self-pay | Admitting: Internal Medicine

## 2011-04-26 ENCOUNTER — Encounter: Payer: 59 | Attending: Internal Medicine

## 2011-04-26 DIAGNOSIS — E119 Type 2 diabetes mellitus without complications: Secondary | ICD-10-CM

## 2011-04-29 ENCOUNTER — Other Ambulatory Visit: Payer: Self-pay | Admitting: *Deleted

## 2011-04-29 MED ORDER — FUROSEMIDE 20 MG PO TABS: ORAL_TABLET | ORAL | Status: DC

## 2011-04-29 NOTE — Telephone Encounter (Signed)
R'cd fax from [facility] for refill of Furosemide  Last filled-03/28/2011

## 2011-05-02 NOTE — Progress Notes (Signed)
  Patient was seen on 04/26/2011 for the second of a series of three diabetes self-management courses at the [facility]. The following learning objectives were met by the patient during this course:   States the relationship of exercise to blood glucose  States benefits/barriers of regular and safe exercise  States three guidelines for safe and effective exercise  Describes personal diabetes medicine regimen  Describes actions of own medications  Describes causes, symptoms, and treatment of hypo/hyperglycemia  Describes sick day rules  Identifies when to test urine for ketones when appropriate  Identifies when to call healthcare provider for acute complications  States the risk for problems with foot, skin, and dental care  States preventative foot, skin, and dental care measures  States when to call healthcare provider regarding foot, skin, and dental care  Identifies methods for evaluation of diabetes control  Discusses benefits of SBGM  Identifies relationship between nutrition, exercise, medication, and glucose levels  Discusses the importance of record keeping  Patient received [facility] Core Program Notebook at class.  Follow-Up Plan: Patient will attend the final class of the ADA Diabetes Self-Care Education.

## 2011-05-03 ENCOUNTER — Encounter: Payer: 59 | Admitting: Dietician

## 2011-05-03 DIAGNOSIS — E119 Type 2 diabetes mellitus without complications: Secondary | ICD-10-CM

## 2011-05-04 NOTE — Progress Notes (Signed)
  Patient was seen on 05/03/2011 for the third of a series of three diabetes self-management courses at the [facility]. The following learning objectives were met by the patient during this course:   Identifies nutrient effects on glycemia  States the general guidelines of meal planning  Relates understanding of personal meal plan  Describes situations that cause stress and discuss methods of stress management  Identifies lifestyle behaviors for change  The following handouts were given in class:  [employer] Carbohydrate Counting book  3 Month Follow Up Visit handout  Goal setting handout  Class evaluation form  Your patient has established the following 3 month goals for diabetes self-care:  Eat meals on time.  Decrease my fat intake  Eat fruits and vegetables each day.  Walk for 30 minutes 7 times per week  Follow-Up Plan: Patient will attend a 3 month follow-up visit for diabetes self-management education.

## 2011-06-07 ENCOUNTER — Telehealth: Payer: Self-pay | Admitting: *Deleted

## 2011-06-07 DIAGNOSIS — Z0000 Encounter for general adult medical examination without abnormal findings: Secondary | ICD-10-CM

## 2011-06-07 DIAGNOSIS — E119 Type 2 diabetes mellitus without complications: Secondary | ICD-10-CM

## 2011-06-07 NOTE — Telephone Encounter (Signed)
Received msg pt schedule cpx need labs in EPIC...06/07/11@8 :49am/LMB

## 2011-06-28 ENCOUNTER — Other Ambulatory Visit: Payer: Self-pay | Admitting: Internal Medicine

## 2011-07-28 ENCOUNTER — Ambulatory Visit: Payer: 59 | Admitting: Dietician

## 2011-08-03 ENCOUNTER — Ambulatory Visit (INDEPENDENT_AMBULATORY_CARE_PROVIDER_SITE_OTHER): Payer: 59 | Admitting: Pulmonary Disease

## 2011-08-03 ENCOUNTER — Encounter: Payer: Self-pay | Admitting: Pulmonary Disease

## 2011-08-03 VITALS — BP 134/80 | HR 93 | Temp 98.4°F | Ht 62.0 in | Wt 239.8 lb

## 2011-08-03 DIAGNOSIS — G4733 Obstructive sleep apnea (adult) (pediatric): Secondary | ICD-10-CM

## 2011-08-03 NOTE — Assessment & Plan Note (Signed)
The patient states that she is wearing CPAP compliantly, but continues to have significant daytime sleepiness.  Part of the problem is her poor sleep hygiene which leads to prolonged awakenings during the night at times.  She denies any issues with her mask fit, however she may be having pressure loss from mouth opening.  At this point, I would like to add a chin strap to her current setup, and we'll also give down load off her machine to verify compliance and make sure that we are controlling her AHI.  I have also asked her to consider all of the environmental issues that can disrupt sleep.

## 2011-08-03 NOTE — Patient Instructions (Signed)
Will get a download off your cpap machine to check its function, whether the pressure is adequate, etc. Think about environmental issues that can affect sleep (mattress, pillow, light, noise, pet, bed partner, pain) You cannot catnap in the evening prior to going to bed.  This will cause sleep disruption at night.  Will call you once I receive your download.

## 2011-08-03 NOTE — Progress Notes (Signed)
  Subjective:    Patient ID: Patricia Morgan, female    DOB: [birth year], 61 y.o.   MRN: [id number]  HPI The patient comes in today for followup of her obstructive sleep apnea.  She is wearing CPAP compliantly, but is having issues with inappropriate daytime sleepiness.  Her CPAP pressure was optimized last year, and she has not gained weight since that time.  She denies any mask leaks, but may be having oral venting.  She is awakening at night and is having a hard time getting back to sleep, but unfortunately is cat napping in front of the TV in the evenings.   Review of Systems  Constitutional: Negative for fever and unexpected weight change.  HENT: Negative for ear pain, nosebleeds, congestion, sore throat, rhinorrhea, sneezing, trouble swallowing, dental problem, postnasal drip and sinus pressure.   Eyes: Negative for redness and itching.  Respiratory: Negative for cough, chest tightness, shortness of breath and wheezing.   Cardiovascular: Negative for palpitations and leg swelling.  Gastrointestinal: Negative for nausea and vomiting.  Genitourinary: Negative for dysuria.  Musculoskeletal: Negative for joint swelling.  Skin: Negative for rash.  Neurological: Negative for headaches.  Hematological: Does not bruise/bleed easily.  Psychiatric/Behavioral: Negative for dysphoric mood. The patient is not nervous/anxious.        Objective:   Physical Exam Obese female in no acute distress No skin breakdown or pressure necrosis from the CPAP mask Lower extremities without significant edema no cyanosis Appears mildly sleepy, but appropriate, moves all 4 extremities.       Assessment & Plan:

## 2011-08-12 ENCOUNTER — Other Ambulatory Visit: Payer: Self-pay | Admitting: Internal Medicine

## 2011-08-22 ENCOUNTER — Telehealth: Payer: Self-pay | Admitting: Pulmonary Disease

## 2011-08-22 NOTE — Telephone Encounter (Signed)
08-03-2011 Order given to [place] for [facility] to obtain 6 month download off cpap machine. Please fax download to [facility] fax number [phone] Loleta Chance) and I will give to Dr. Gwenette Greet. Please obtain ASAP. Catha Gosselin   08-03-2011 Order faxed to [facility] and original given to [place] to provide pt with chin strap to current nasal mask. Contact pt on home # and may leave message on this number. Do not contact pt at work number. [place] patient-she stated she has not heard from [facility] at all for chin strap or 6 month download. I called Lecretia with [facility]-stated patient was mailed a letter about getting in touch with [facility] for download and chin strap-pt was contacted on 08-15-11 about a balance she had with [facility]. Pura Spice stated patient needs to call [facility] at [phone] and speak with Malvin Johns at ext [phone]. Pt is aware to call [facility] and get appt set up for chin strap fitting and allow them to get download for [facility] to review asap.

## 2011-09-01 ENCOUNTER — Other Ambulatory Visit: Payer: Self-pay | Admitting: Internal Medicine

## 2011-09-05 ENCOUNTER — Ambulatory Visit: Payer: 59 | Admitting: Dietician

## 2011-09-30 ENCOUNTER — Other Ambulatory Visit (INDEPENDENT_AMBULATORY_CARE_PROVIDER_SITE_OTHER): Payer: 59

## 2011-09-30 ENCOUNTER — Ambulatory Visit (INDEPENDENT_AMBULATORY_CARE_PROVIDER_SITE_OTHER): Payer: 59 | Admitting: Internal Medicine

## 2011-09-30 ENCOUNTER — Encounter: Payer: Self-pay | Admitting: Internal Medicine

## 2011-09-30 VITALS — BP 120/82 | HR 79 | Temp 98.5°F | Ht 62.0 in | Wt 237.1 lb

## 2011-09-30 DIAGNOSIS — Z0000 Encounter for general adult medical examination without abnormal findings: Secondary | ICD-10-CM

## 2011-09-30 DIAGNOSIS — E119 Type 2 diabetes mellitus without complications: Secondary | ICD-10-CM

## 2011-09-30 DIAGNOSIS — G4733 Obstructive sleep apnea (adult) (pediatric): Secondary | ICD-10-CM

## 2011-09-30 DIAGNOSIS — E785 Hyperlipidemia, unspecified: Secondary | ICD-10-CM

## 2011-09-30 LAB — CBC WITH DIFFERENTIAL/PLATELET
Basophils Absolute: 0 10*3/uL (ref 0.0–0.1)
Eosinophils Relative: 3.4 % (ref 0.0–5.0)
HCT: 33.4 % — ABNORMAL LOW (ref 36.0–46.0)
Hemoglobin: 11.3 g/dL — ABNORMAL LOW (ref 12.0–15.0)
Lymphs Abs: 1.5 10*3/uL (ref 0.7–4.0)
MCV: 91 fl (ref 78.0–100.0)
Monocytes Absolute: 0.4 10*3/uL (ref 0.1–1.0)
Monocytes Relative: 9.9 % (ref 3.0–12.0)
Neutro Abs: 2 10*3/uL (ref 1.4–7.7)
Platelets: 215 10*3/uL (ref 150.0–400.0)
RDW: 13.7 % (ref 11.5–14.6)

## 2011-09-30 LAB — BASIC METABOLIC PANEL
BUN: 30 mg/dL — ABNORMAL HIGH (ref 6–23)
Chloride: 109 mEq/L (ref 96–112)
GFR: 35.47 mL/min — ABNORMAL LOW (ref >60.00)
Glucose, Bld: 118 mg/dL — ABNORMAL HIGH (ref 70–99)
Potassium: 4.7 mEq/L (ref 3.5–5.1)
Sodium: 142 mEq/L (ref 135–145)

## 2011-09-30 LAB — LIPID PANEL
HDL: 46.7 mg/dL (ref >39.00)
LDL Cholesterol: 83 mg/dL (ref 0–99)
Total CHOL/HDL Ratio: 3
VLDL: 25 mg/dL (ref 0.0–40.0)

## 2011-09-30 LAB — HEPATIC FUNCTION PANEL
ALT: 19 U/L (ref 0–35)
Bilirubin, Direct: 0.1 mg/dL (ref 0.0–0.3)
Total Bilirubin: 0.6 mg/dL (ref 0.3–1.2)

## 2011-09-30 LAB — URINALYSIS, ROUTINE W REFLEX MICROSCOPIC
Bilirubin Urine: NEGATIVE
Hgb urine dipstick: NEGATIVE
Ketones, ur: NEGATIVE
Total Protein, Urine: 100
pH: 6 (ref 5.0–8.0)

## 2011-09-30 NOTE — Assessment & Plan Note (Signed)
hosp [facility] 09/2009 - now follows with endo (balan) Weight loss recommended, pt reports improved cbgs On ASA, statin and ARB Eye check annually - digby Check a1c now, The current medical regimen is effective;  continue present plan and medications.

## 2011-09-30 NOTE — Assessment & Plan Note (Signed)
Lipids pending with cpx labs - on pravastain The current medical regimen is effective;  continue present plan and medications.

## 2011-09-30 NOTE — Progress Notes (Signed)
Subjective:    Patient ID: Patricia Morgan, female    DOB: Oct 05, 1950, 61 y.o.   MRN: [id number]  HPI  patient is here today for annual physical. Patient feels well and has no complaints.  Also reviewed chronic medical issues:  DM2 - hx [facility] 09/2009 - prev diet controlled - follows now with endo (balan) - now on insulin - home log reviewed - checking cbgs 3-4x/d - no hypoglycemic events or symptoms - resolved PU/PD-  hypertension - no chest pain, headache, no vision change or lightheaded symptoms - reports compliance with ongoing medical treatment and no changes in medication dose or frequency. denies adverse side effects related to current therapy.   CKD 3-  Follows with renal for same- edema is better than usual -   PE hx 09/2008 - on anticoag x 18 months; - no chest pain, shortness of breath, no leg swelling - denies weight loss - no recurrent cancer (breast 1995 w/o recurrence), no FH clots or prior DVT hx - wants to stop coumadin   Past Medical History  Diagnosis Date  . OBSTRUCTIVE SLEEP APNEA     CPAP  . PULMONARY EMBOLISM 09/24/2008    anticoag thru 03/2010  . Proteinuria   . BREAST CANCER, HX OF 1995    Right Breast  . ANEMIA-NOS   . DIABETES MELLITUS, TYPE II   . HYPERLIPIDEMIA   . HYPERTENSION   . CHRONIC KIDNEY DISEASE STAGE III (MODERATE)    Family History  Problem Relation Age of Onset  . Diabetes Sister    History  Substance Use Topics  . Smoking status: Never Smoker   . Smokeless tobacco: Not on file   Comment: Lives alone-divorced  . Alcohol Use: No    Review of Systems Constitutional: Negative for fever.  Respiratory: Negative for cough and shortness of breath.   Cardiovascular: Negative for chest pain.  Gastrointestinal: Negative for abdominal pain.  Musculoskeletal: Negative for gait problem.  Skin: Negative for rash.  Neurological: Negative for dizziness. falling asleep at work despite CPAP No other specific complaints in a complete review  of systems (except as listed in HPI above).     Objective:   Physical Exam  BP 120/82  Pulse 79  Temp(Src) 98.5 F (36.9 C) (Oral)  Ht 5\' 2"  (1.575 m)  Wt 237 lb 1.9 oz (107.557 kg)  BMI 43.37 kg/m2  SpO2 97% Wt Readings from Last 3 Encounters:  09/30/11 237 lb 1.9 oz (107.557 kg)  08/03/11 239 lb 12.8 oz (108.773 kg)  03/01/11 245 lb 3.2 oz (111.222 kg)   Constitutional: She is overweight, but appears well-developed and well-nourished. No distress.  HENT: Head: Normocephalic and atraumatic. Ears: B TMs ok, no erythema or effusion; Nose: Nose normal. Mouth/Throat: Oropharynx is clear and moist. No oropharyngeal exudate.  Eyes: Conjunctivae and EOM are normal. Pupils are equal, round, and reactive to light. No scleral icterus.  Neck: Normal range of motion. Neck supple. No JVD present. No thyromegaly present.  Cardiovascular: Normal rate, regular rhythm and normal heart sounds.  No murmur heard. No BLE edema. Pulmonary/Chest: Effort normal and breath sounds normal. No respiratory distress. She has no wheezes.  Abdominal: Soft. Bowel sounds are normal. She exhibits no distension. There is no tenderness. no masses Musculoskeletal: Normal range of motion, no joint effusions. No gross deformities Neurological: She is alert and oriented to person, place, and time. No cranial nerve deficit. Coordination normal.  Skin: Skin is warm and dry. No rash noted. No erythema.  Psychiatric: She has a normal mood and affect. Her behavior is normal. Judgment and thought content normal.    Lab Results  Component Value Date   WBC 4.3* 09/28/2010   HGB 11.4* 09/28/2010   HCT 32.9* 09/28/2010   PLT 225.0 09/28/2010   CHOL 160 09/28/2010   TRIG 81.0 09/28/2010   HDL 41.70 09/28/2010   LDLDIRECT 141.3 10/14/2008   ALT 19 09/28/2010   AST 20 09/28/2010   NA 141 09/28/2010   K 4.7 09/28/2010   CL 108 09/28/2010   CREATININE 1.8* 09/28/2010   BUN 38* 09/28/2010   CO2 26 09/28/2010   TSH 2.09 09/28/2010   INR  2.4 03/30/2010   HGBA1C 5.8 09/28/2010        Assessment & Plan:  CPX - v70.0 - Patient has been counseled on age-appropriate routine health concerns for screening and prevention. These are reviewed and up-to-date. Immunizations are up-to-date or declined. Labs ordered and ECG reviewed: NSR @71  with PR .222  (1st deg AVB) - unchanged from 09/28/2010  Also see problem list. Medications and labs reviewed today.

## 2011-09-30 NOTE — Patient Instructions (Signed)
It was good to see you today. Health Maintenance reviewed - all recommended immunizations and age-appropriate screenings are up-to-date. Test(s) ordered today. Your results will be called to you after review (48-72hours after test completion). If any changes need to be made, you will be notified at that time. Work on lifestyle changes as discussed (low fat, low carb, increased protein diet; improved exercise efforts; weight loss) to control sugar, blood pressure and cholesterol levels and/or reduce risk of developing other medical problems. Look into http://vang.com/ or other type of food journal to assist you in this process. Ask Dr Chalmers Cater if Donna Bernard is an option for your diabetes mellitus treatment Keep working with Dr Gwenette Greet on your sleep problems Please schedule followup in 6-12 months, call sooner if problems.

## 2011-09-30 NOTE — Assessment & Plan Note (Signed)
persisting daytime sleepiness interfering with work Using CPAP and working with pulm on same The patient is asked to make an attempt to improve diet and exercise patterns for weight loss efforts to aid in medical management of this problem.

## 2011-10-24 ENCOUNTER — Other Ambulatory Visit: Payer: Self-pay | Admitting: Internal Medicine

## 2011-10-26 ENCOUNTER — Encounter: Payer: Self-pay | Admitting: Pulmonary Disease

## 2011-10-26 ENCOUNTER — Ambulatory Visit (INDEPENDENT_AMBULATORY_CARE_PROVIDER_SITE_OTHER): Payer: 59 | Admitting: Pulmonary Disease

## 2011-10-26 VITALS — BP 110/70 | HR 77 | Temp 98.3°F

## 2011-10-26 DIAGNOSIS — G4733 Obstructive sleep apnea (adult) (pediatric): Secondary | ICD-10-CM

## 2011-10-26 DIAGNOSIS — G4710 Hypersomnia, unspecified: Secondary | ICD-10-CM

## 2011-10-26 MED ORDER — TRAZODONE HCL 50 MG PO TABS: 50.0000 mg | ORAL_TABLET | Freq: Every day | ORAL | Status: DC

## 2011-10-26 NOTE — Assessment & Plan Note (Signed)
The patient continues to have significant daytime hypersomnia that is interfering with her job despite wearing CPAP compliantly.  Her download shows adequate control of her sleep disordered breathing, but I suspect the problem is in adequate sleep time.  She continues to have issues with sleep onset and maintenance, and is probably only getting about 4 hours of sleep a night.  I also wonder whether she may be in the subpopulation of sleep apnea patients who have persistent sleepiness despite adequate treatment of their sleep disorder breathing.  Finally, we also need to keep in mind whether there is a neurologic explanation for her persistent sleepiness.  If this continues, she may need a CT scan of her head for completeness.  I would like to start out by treating her insomnia with a trial of trazodone to see if things improve.  If they do not, I would consider nuvigil during the day to help with alertness.

## 2011-10-26 NOTE — Progress Notes (Signed)
  Subjective:    Patient ID: Patricia Morgan, female    DOB: Jul 17, 1950, 61 y.o.   MRN: [id number]  HPI The patient comes in today for an acute sick visit.  She has known obstructive sleep apnea, and has been wearing CPAP compliantly by her downloads.  Despite wearing CPAP, she is having persistent daytime sleepiness that is endangering her job.  Unfortunately, she continues to have significant issues with sleep onset and maintenance, and is probably only getting about 4 hours of sleep a night because of this.  Her download shows that she is very compliant with CPAP, and although is having small mask leaks, her AHI is well controlled.  She is not taking any medication on a regular basis that is overly sedating.  She has had this sleepiness issue during the day for only 3 years, and has no history to suggest narcolepsy.  She denies any chronic headache, visual changes, or asymmetric neurologic symptoms involving her extremities.   Review of Systems  Constitutional: Positive for fatigue. Negative for fever and unexpected weight change.  HENT: Negative for ear pain, nosebleeds, congestion, sore throat, rhinorrhea, sneezing, trouble swallowing, dental problem, postnasal drip and sinus pressure.   Eyes: Negative for redness and itching.  Respiratory: Negative for cough, chest tightness, shortness of breath and wheezing.   Cardiovascular: Negative for palpitations and leg swelling.  Gastrointestinal: Negative for nausea and vomiting.  Genitourinary: Negative for dysuria.  Musculoskeletal: Negative for joint swelling.  Skin: Negative for rash.  Neurological: Negative for headaches.  Hematological: Does not bruise/bleed easily.  Psychiatric/Behavioral: Positive for disturbed wake/sleep cycle. Negative for dysphoric mood. The patient is not nervous/anxious.        Objective:   Physical Exam Obese female in no acute distress No skin breakdown or pressure necrosis from the CPAP mask Chest clear to  auscultation Cardiac exam is regular rate and rhythm Lower extremities with mild edema, no cyanosis Appears very sleepy, but appropriate, moves all 4 extremities.        Assessment & Plan:

## 2011-10-26 NOTE — Patient Instructions (Addendum)
Will start trazodone 50mg  each night about one hour before bedtime. Continue on cpap. Please call me in 2 weeks with how things are going.  If you are not doing better, will take another approach. Continue to work on weight loss.

## 2011-10-26 NOTE — Assessment & Plan Note (Signed)
The patient is wearing CPAP compliantly by her download, and appears to have control of her AHI.  I have asked her to continue on CPAP, and also to work aggressively on weight loss.

## 2011-11-14 ENCOUNTER — Telehealth: Payer: Self-pay | Admitting: Pulmonary Disease

## 2011-11-14 NOTE — Telephone Encounter (Signed)
Pt was instructed at last ov to call in 2 weeks to inform [facility] if any improvement with new Rx (Trazadone 50mg  1 po qhs, one hour before bedtime), Pt states this medication is not working and she is still having trouble with falling asleep during the day at work. She states she is sleeping fine at night and is sleeping all through the night but cannot stay awake all day at work. Pt states she would like to take a different approach and try a different medication. Please advise.

## 2011-11-14 NOTE — Telephone Encounter (Signed)
Ok to stop trazodone, but she still needs to get adequate sleep at night for at least 6 hrs with cpap for her to have any chance of getting rest.  If she is still having issues, needs a daytime sleep study called MSLT which is a series of naps to see if she has narcolepsy.  See if she is willing to do this, or if any questions, will be happy to call her.

## 2011-11-14 NOTE — Telephone Encounter (Signed)
Pt aware of [place] and wants to go ahead with MSLT. Pt aware we will call her once this has been set up for her.

## 2011-11-15 ENCOUNTER — Other Ambulatory Visit: Payer: Self-pay | Admitting: Pulmonary Disease

## 2011-11-15 DIAGNOSIS — G4733 Obstructive sleep apnea (adult) (pediatric): Secondary | ICD-10-CM

## 2011-11-15 DIAGNOSIS — G4710 Hypersomnia, unspecified: Secondary | ICD-10-CM

## 2011-11-15 NOTE — Telephone Encounter (Signed)
Spoke with pt and notified of recs per [facility]. She verbalized understanding. States that she will pick up sleep diary tomorrow p 4:30. Will forward to Cecille Rubin so she is aware. Thanks

## 2011-11-15 NOTE — Telephone Encounter (Signed)
lmomtcb x1 for pt 

## 2011-11-15 NOTE — Telephone Encounter (Signed)
Will send an order to [facility]cc for this to be scheduled. Let pt know that she needs to come by and pick up sleep diary that she needs to keep for 2 weeks leading up to the study.  Cecille Rubin can show her how to fill out.  She needs to complete this, and bring to sleep center with her to leave with them.

## 2011-11-16 NOTE — Telephone Encounter (Signed)
Pt came by to have MLST scheduled with [facility] and was given Sleep Log. She is aware she should keep this log for the next 2 weeks and also take it with her when she goes for the test. Pt verbalized understanding.

## 2011-11-29 ENCOUNTER — Emergency Department (HOSPITAL_COMMUNITY)
Admission: EM | Admit: 2011-11-29 | Discharge: 2011-11-29 | Disposition: A | Discharge status: Home / Self Care | Payer: 59 | Source: Home / Self Care

## 2011-11-29 ENCOUNTER — Encounter (HOSPITAL_COMMUNITY): Payer: Self-pay | Admitting: *Deleted

## 2011-11-29 DIAGNOSIS — H9209 Otalgia, unspecified ear: Secondary | ICD-10-CM

## 2011-11-29 DIAGNOSIS — H9201 Otalgia, right ear: Secondary | ICD-10-CM

## 2011-11-29 MED ORDER — IBUPROFEN 600 MG PO TABS: 600.0000 mg | ORAL_TABLET | Freq: Four times a day (QID) | ORAL | Status: AC | PRN

## 2011-11-29 MED ORDER — CETIRIZINE HCL 10 MG PO TABS: 10.0000 mg | ORAL_TABLET | Freq: Every day | ORAL | Status: DC

## 2011-11-29 NOTE — ED Provider Notes (Signed)
Medical screening examination/treatment/procedure(s) were performed by non-physician practitioner and as supervising physician I was immediately available for consultation/collaboration.  Philipp Deputy, M.D.   Harden Mo, MD 11/29/11 2217

## 2011-11-29 NOTE — ED Notes (Signed)
Pt  Reports  Earache   Off  And  On  For  6-7  Months  She  Reports  She  Was  Seen by  Pcp at that time  And  Was  RX  flonase      Spray    She    Also  Reports   Some          Swelling  Behind  The  r  Ear  Area  As  Well

## 2011-11-29 NOTE — ED Provider Notes (Signed)
History     CSN: [id number]  Arrival date & time 11/29/11  1749   None     Chief Complaint  Patient presents with  . Otalgia    (Consider location/radiation/quality/duration/timing/severity/associated sxs/prior treatment) The history is provided by the patient.  Patricia Morgan is a 61 y.o. female who complains of onset of right ear pain for 6 months, states today noted "knot" behind ear. Denies fever or ear drainage, reports ear feels full with muffled hearing. No sore throat No cough, non productive No pleuritic pain No wheezing No nasal congestion No post-nasal drainage No sinus pain/pressure No chest congestion No itchy/red eyes No hemoptysis No SOB No chills/sweats No fever No nausea No vomiting No abdominal pain No diarrhea No skin rashes No fatigue No myalgias No headache    Past Medical History  Diagnosis Date  . OBSTRUCTIVE SLEEP APNEA     CPAP  . PULMONARY EMBOLISM 09/24/2008    anticoag thru 03/2010  . Proteinuria   . BREAST CANCER, HX OF 1995    Right Breast  . ANEMIA-NOS   . DIABETES MELLITUS, TYPE II   . HYPERLIPIDEMIA   . HYPERTENSION   . CHRONIC KIDNEY DISEASE STAGE III (MODERATE)     Past Surgical History  Procedure Date  . Mastectomy 1995    Right  . Insertion port a cath   . Port-a-cath removal     Family History  Problem Relation Age of Onset  . Diabetes Sister     History  Substance Use Topics  . Smoking status: Never Smoker   . Smokeless tobacco: Not on file   Comment: Lives alone-divorced  . Alcohol Use: No    OB History    Grav Para Term Preterm Abortions TAB SAB Ect Mult Living                  Review of Systems  All other systems reviewed and are negative.    Allergies  Review of patient's allergies indicates no known allergies.  Home Medications   Current Outpatient Rx  Name Route Sig Dispense Refill  . ALBUTEROL SULFATE HFA 108 (90 BASE) MCG/ACT IN AERS Inhalation Inhale 2 puffs into the lungs every 4  (four) hours as needed.      . ALCOHOL PREP 70 % PADS  Use as directed 200 each 5  . AMLODIPINE BESYLATE 10 MG PO TABS  TAKE 1 TABLET BY MOUTH EVERY DAY 90 tablet 3  . ASPIRIN 325 MG PO TABS Oral Take 325 mg by mouth daily.      . BD PEN NEEDLE MINI U/F 31G X 5 MM MISC  USE AS DIRECTED WITH LANTUS AND HUMALOG (5 INJECTIONS DAILY) 200 each 6  . CARVEDILOL 25 MG PO TABS  TAKE 1 TABLET BY MOUTH TWICE DAILY 180 tablet 2  . DIPHENHYDRAMINE HCL 25 MG PO TABS Oral Take 25 mg by mouth Nightly.      [name] FERREX 150 150 MG PO CAPS  TAKE 1 CAPSULE BY MOUTH ONCE DAILY 90 capsule 1  . FUROSEMIDE 20 MG PO TABS  TAKE 1 TABLET BY MOUTH TWICE DAILY 60 tablet 5  . INSULIN GLARGINE 100 UNIT/ML [place] SOLN  15 units AM and as needed units PM (or as per endo - dr. Chalmers Cater) 10 mL 1  . INSULIN LISPRO (HUMAN) 100 UNIT/ML [place] SOLN Subcutaneous Inject 10 Units into the skin as needed.     [name] LOSARTAN POTASSIUM 100 MG PO TABS  TAKE 1 TABLET BY MOUTH  EVERY DAY 90 tablet 1  . ONETOUCH ULTRA BLUE VI STRP  USE TO CHECK BLOOD SUGAR THREE TIMES A DAY 300 each 1  . ONETOUCH DELICA LANCETS MISC  USE THREE TIMES A DAY 300 each 1  . PRAVASTATIN SODIUM 40 MG PO TABS  TAKE 1 TABLET (40 MG TOTAL) BY MOUTH DAILY. 90 tablet 0  . TRAZODONE HCL 50 MG PO TABS Oral Take 1 tablet (50 mg total) by mouth at bedtime. 30 tablet 0    BP 132/68  Pulse 84  Temp 98.5 F (36.9 C) (Oral)  Resp 16  SpO2 96%  Physical Exam  Nursing note and vitals reviewed. Constitutional: She is oriented to person, place, and time. Vital signs are normal. She appears well-developed and well-nourished. She is active and cooperative.  HENT:  Head: Normocephalic. No trismus in the jaw.  Right Ear: Hearing, tympanic membrane, external ear and ear canal normal.  Left Ear: Hearing, tympanic membrane, external ear and ear canal normal.  Nose: Mucosal edema and rhinorrhea present.  Mouth/Throat: Uvula is midline, oropharynx is clear and moist and mucous membranes are normal.  No oropharyngeal exudate, posterior oropharyngeal edema or posterior oropharyngeal erythema.  Eyes: Conjunctivae and EOM are normal. Pupils are equal, round, and reactive to light. No scleral icterus.  Neck: Trachea normal. Neck supple. No thyromegaly present.  Cardiovascular: Normal rate and regular rhythm.   Pulmonary/Chest: Effort normal and breath sounds normal.  Lymphadenopathy:       Head (right side): Preauricular and posterior auricular adenopathy present. No submental, no submandibular, no tonsillar and no occipital adenopathy present.       Head (left side): No submental, no submandibular, no tonsillar, no preauricular, no posterior auricular and no occipital adenopathy present.    She has no cervical adenopathy.  Neurological: She is alert and oriented to person, place, and time. No cranial nerve deficit or sensory deficit.  Skin: Skin is warm and dry.  Psychiatric: She has a normal mood and affect. Her speech is normal and behavior is normal. Judgment and thought content normal. Cognition and memory are normal.    ED Course  Procedures (including critical care time)  Labs Reviewed - No data to display No results found.   1. Otalgia of right ear       MDM  Medication as prescribed.  ENT if symptoms persist.  RTC as needed.  Awilda Metro, NP 11/29/11 2200

## 2011-11-30 ENCOUNTER — Ambulatory Visit (HOSPITAL_BASED_OUTPATIENT_CLINIC_OR_DEPARTMENT_OTHER): Payer: 59 | Attending: Pulmonary Disease | Admitting: Radiology

## 2011-11-30 DIAGNOSIS — G47419 Narcolepsy without cataplexy: Secondary | ICD-10-CM

## 2011-11-30 DIAGNOSIS — G4710 Hypersomnia, unspecified: Secondary | ICD-10-CM

## 2011-11-30 DIAGNOSIS — G4733 Obstructive sleep apnea (adult) (pediatric): Secondary | ICD-10-CM | POA: Insufficient documentation

## 2011-12-09 ENCOUNTER — Telehealth: Payer: Self-pay | Admitting: Pulmonary Disease

## 2011-12-09 NOTE — Telephone Encounter (Signed)
E[place], please advise once the sleep study is read so we can schedule ov with you to review thanks!

## 2011-12-13 NOTE — Telephone Encounter (Signed)
Let her know that I just got back in town, and will be reading studies in next day or two.  Will let her know.

## 2011-12-14 NOTE — Telephone Encounter (Signed)
lmomtcb x1 for pt to make aware of this

## 2011-12-14 NOTE — Telephone Encounter (Signed)
Pt returned call.  Advised that [facility] just returned and that he will be reading studies within the next couple of days and will call her regarding hers.  Pt okay with this and verbalized her understanding.  Nothing further needed at this time.  Will sign off.

## 2011-12-17 DIAGNOSIS — G4710 Hypersomnia, unspecified: Secondary | ICD-10-CM

## 2011-12-17 NOTE — Procedures (Signed)
[name], Patricia Morgan             ACCOUNT NO.:  [id number]  MEDICAL RECORD NO.:  [id number]          PATIENT TYPE:  OUT  LOCATION:  SLEEP CENTER                 FACILITY[facility]  [facility]  PHYSICIAN:  Kathee Delton, MD,FCCPDATE OF BIRTH:  09-25-1948  DATE OF STUDY:  11/30/2011                         MULTIPLE SLEEP LATENCY TEST  REFERRING PHYSICIAN:  Kathee Delton, MD,FCCP  LOCATION:  Sleep lab.  INDICATION FOR STUDY:  Hypersomnia and narcolepsy without cataplexy.  EPWORTH SLEEPINESS SCORE: 20   NAP 1:  Sleep onset latency 20 minutes, and REM onset N/A.  NAP 2:  Sleep onset latency 6.0 minutes, and REM onset N/A.  NAP 3:  Sleep onset latency of 5 minutes, REM onset latency 5.5 minutes.  NAP 4:  Sleep onset latency 1.0 minutes.  REM onset latency N/A.  NAP 5:  Sleep onset latency to 2.0 minutes.  REM onset latency N/A.   MEAN SLEEP LATENCY:  6.8 minutes.  NUMBER OF REM EPISODES:1  COMMENTS: 1. The patient has known obstructive sleep apnea, and has been shown     to have excellent compliance and control of her obstructive events     with a CPAP download prior to the study. 2. The patient was asked to complete sleep diaries, but only filled     out 4 days out of an expected 2 weeks.  She appeared to have     adequate total sleep time during those 4 days. 3. It should be noted the patient did her nap studies with her usual     CPAP.  IMPRESSIONS-RECOMMENDATIONS:  Moderate objective daytime sleepiness with a mean sleep latency of 6.8 minutes, but only 1 sleep onset REM.  The study is not consistent with the diagnosis of narcolepsy.  Clinical correlation is suggested for further management.     Kathee Delton, MD,FCCP Diplomate, [place] Board of Sleep Medicine    [facility]/MEDQ  D:  12/17/2011 13:52:53  T:  12/17/2011 19:28:44  Job:  [id number]

## 2012-01-04 ENCOUNTER — Ambulatory Visit (INDEPENDENT_AMBULATORY_CARE_PROVIDER_SITE_OTHER): Payer: 59 | Admitting: Pulmonary Disease

## 2012-01-04 ENCOUNTER — Encounter: Payer: Self-pay | Admitting: Pulmonary Disease

## 2012-01-04 VITALS — BP 140/78 | HR 74 | Temp 98.8°F | Ht 62.0 in | Wt 241.4 lb

## 2012-01-04 DIAGNOSIS — G4733 Obstructive sleep apnea (adult) (pediatric): Secondary | ICD-10-CM

## 2012-01-04 DIAGNOSIS — G4710 Hypersomnia, unspecified: Secondary | ICD-10-CM

## 2012-01-04 MED ORDER — ARMODAFINIL 150 MG PO TABS: 150.0000 mg | ORAL_TABLET | Freq: Every day | ORAL | Status: DC

## 2012-01-04 NOTE — Progress Notes (Signed)
  Subjective:    Patient ID: Patricia Morgan, female    DOB: 1951-02-08, 61 y.o.   MRN: [id number]  HPI The patient comes in today for followup after her recent MS LT for workup of her persistent hypersomnia.  The patient has known obstructive sleep apnea, and has been wearing CPAP compliantly with a download showing excellent control.  Despite this, she is continued to have severe daytime sleepiness which has interfered with her job, but she has had issues with decreased quantity of total sleep time.  She occasionally has issues with sleep onset, it has more significant issues with sleep maintenance.  She may only get up to 4 hours of sleep on many nights.  I have tried her on behavioral therapies and trazodone, but this did not improve things.  I had asked her to fill out sleep diaries for 2 weeks leading up to her multiple sleep latency testing, but she only filled out 4 days.  Her recent MS LT showed a mean sleep latency of 6.8 minutes, and only one REM period during this study.   Review of Systems  Constitutional: Negative for fever and unexpected weight change.  HENT: Negative for ear pain, nosebleeds, congestion, sore throat, rhinorrhea, sneezing, trouble swallowing, dental problem, postnasal drip and sinus pressure.   Eyes: Negative for redness and itching.  Respiratory: Negative for cough, chest tightness, shortness of breath and wheezing.   Cardiovascular: Negative for palpitations and leg swelling.  Gastrointestinal: Negative for nausea and vomiting.  Genitourinary: Negative for dysuria.  Musculoskeletal: Negative for joint swelling.  Skin: Negative for rash.  Neurological: Negative for headaches.  Hematological: Does not bruise/bleed easily.  Psychiatric/Behavioral: Negative for dysphoric mood. The patient is not nervous/anxious.   All other systems reviewed and are negative.       Objective:   Physical Exam Obese female in no acute distress No skin breakdown or pressure  necrosis from the CPAP mask Lower extremities with mild edema, no cyanosis The patient appears sleepy today, but appropriate and moves all 4 extremities.       Assessment & Plan:

## 2012-01-04 NOTE — Patient Instructions (Addendum)
Continue on cpap compliantly I will recommend to your primary physician to consider referral to psychology or psychiatry for behavioral therapy to help with your insomnia Trial of nuvigil 150mg  one each am to see if this will help your daytime alertness.  Remember though, this is not a substitute for adequate quantity of sleep each night. followup with me in 72mos, but call in a few weeks to give me some feedback as to how the new medication is working.

## 2012-01-04 NOTE — Assessment & Plan Note (Addendum)
I suspect this is a multifactorial problem.  Her MS LT does show some degree of objective daytime sleepiness, but is not consistent with narcolepsy.  It is unlikely this is related to idiopathic hypersomnia.  She does have sleep apnea, and she may be in this subset of patients that have persistent sleepiness despite adequate treatment of her sleep disordered breathing.  The other concern, and perhaps the most important, is that she is not getting enough sleep at night, thus leading to chronic sleep deprivation.  I think it is very important that she have a consultation with a behavioral therapist to work with her on CBT.  I will make this recommendation to her primary care physician.  In the meantime, I am willing to try her on Nuvigil to see if we can make her more functional during the day (I have told her this medication can interfere with birth control pills and make them ineffective).   I have stressed to her this is not a substitute for getting adequate sleep at night.  It will be very important to work on her persistent insomnia.

## 2012-01-12 ENCOUNTER — Other Ambulatory Visit: Payer: Self-pay | Admitting: Internal Medicine

## 2012-01-13 ENCOUNTER — Ambulatory Visit (INDEPENDENT_AMBULATORY_CARE_PROVIDER_SITE_OTHER): Payer: 59 | Admitting: Internal Medicine

## 2012-01-13 ENCOUNTER — Encounter: Payer: Self-pay | Admitting: Internal Medicine

## 2012-01-13 VITALS — BP 120/76 | HR 77 | Temp 98.2°F | Ht 62.0 in | Wt 241.0 lb

## 2012-01-13 DIAGNOSIS — E785 Hyperlipidemia, unspecified: Secondary | ICD-10-CM

## 2012-01-13 DIAGNOSIS — E119 Type 2 diabetes mellitus without complications: Secondary | ICD-10-CM

## 2012-01-13 DIAGNOSIS — I10 Essential (primary) hypertension: Secondary | ICD-10-CM

## 2012-01-13 DIAGNOSIS — G4733 Obstructive sleep apnea (adult) (pediatric): Secondary | ICD-10-CM

## 2012-01-13 MED ORDER — ALBUTEROL SULFATE HFA 108 (90 BASE) MCG/ACT IN AERS: 2.0000 | INHALATION_SPRAY | RESPIRATORY_TRACT | Status: DC | PRN

## 2012-01-13 MED ORDER — FUROSEMIDE 20 MG PO TABS: 20.0000 mg | ORAL_TABLET | Freq: Every day | ORAL | Status: DC

## 2012-01-13 MED ORDER — CETIRIZINE HCL 10 MG PO TABS: 10.0000 mg | ORAL_TABLET | Freq: Every day | ORAL | Status: DC

## 2012-01-13 MED ORDER — POLYSACCHARIDE IRON COMPLEX 150 MG PO CAPS: 150.0000 mg | ORAL_CAPSULE | Freq: Every day | ORAL | Status: DC

## 2012-01-13 MED ORDER — AMLODIPINE BESYLATE 10 MG PO TABS: 10.0000 mg | ORAL_TABLET | Freq: Every day | ORAL | Status: DC

## 2012-01-13 MED ORDER — GLUCOSE BLOOD VI STRP: ORAL_STRIP | Status: DC

## 2012-01-13 MED ORDER — ONETOUCH DELICA LANCETS MISC: Status: DC

## 2012-01-13 MED ORDER — PRAVASTATIN SODIUM 40 MG PO TABS: 40.0000 mg | ORAL_TABLET | Freq: Every day | ORAL | Status: DC

## 2012-01-13 MED ORDER — INSULIN LISPRO 100 UNIT/ML ~~LOC~~ SOLN: 10.0000 [IU] | SUBCUTANEOUS | Status: DC | PRN

## 2012-01-13 MED ORDER — LOSARTAN POTASSIUM 100 MG PO TABS: 100.0000 mg | ORAL_TABLET | Freq: Every day | ORAL | Status: DC

## 2012-01-13 MED ORDER — CARVEDILOL 25 MG PO TABS: 25.0000 mg | ORAL_TABLET | Freq: Two times a day (BID) | ORAL | Status: DC

## 2012-01-13 MED ORDER — INSULIN GLARGINE 100 UNIT/ML ~~LOC~~ SOLN: SUBCUTANEOUS | Status: DC

## 2012-01-13 NOTE — Progress Notes (Signed)
Subjective:    Patient ID: Patricia Morgan, female    DOB: [birth year], 61 y.o.   MRN: [id number]  Diabetes   Also reviewed chronic medical issues:  DM2 - hx [facility] 09/2009 - prev diet controlled - follows now with endo (balan) - now on insulin - home log reviewed - checking cbgs 3-4x/d - no hypoglycemic events or symptoms - resolved PU/PD-  hypertension - no chest pain, headache, no vision change or lightheaded symptoms - reports compliance with ongoing medical treatment and no changes in medication dose or frequency. denies adverse side effects related to current therapy.   Dyslipidemia - on prava - the patient reports compliance with medication(s) as prescribed. Denies adverse side effects.  CKD 3-  Follows with renal for same- edema is better than usual -   PE hx 09/2008 - on anticoag x 18 months; - no chest pain, shortness of breath, no leg swelling - denies weight loss - no recurrent cancer (breast 1995 w/o recurrence), no FH clots or prior DVT hx - wants to stop coumadin   Past Medical History  Diagnosis Date  . OBSTRUCTIVE SLEEP APNEA     CPAP  . PULMONARY EMBOLISM 09/24/2008    anticoag thru 03/2010  . Proteinuria   . BREAST CANCER, HX OF 1995    Right Breast  . ANEMIA-NOS   . DIABETES MELLITUS, TYPE II   . HYPERLIPIDEMIA   . HYPERTENSION   . CHRONIC KIDNEY DISEASE STAGE III (MODERATE)     Review of Systems Constitutional: Negative for fever.  Respiratory: Negative for cough and shortness of breath.   Cardiovascular: Negative for chest pain.  Gastrointestinal: Negative for abdominal pain.  Musculoskeletal: Negative for gait problem.  Skin: Negative for rash.  Neurological: Negative for dizziness. falling asleep at work despite CPAP No other specific complaints in a complete review of systems (except as listed in HPI above).     Objective:   Physical Exam  BP 120/76  Pulse 77  Temp 98.2 F (36.8 C) (Oral)  Ht 5\' 2"  (1.575 m)  Wt 241 lb (109.317 kg)  BMI  44.08 kg/m2  SpO2 96% Wt Readings from Last 3 Encounters:  01/13/12 241 lb (109.317 kg)  01/04/12 241 lb 6.4 oz (109.498 kg)  09/30/11 237 lb 1.9 oz (107.557 kg)   Constitutional: She is overweight, but appears well-developed and well-nourished. No distress.   Neck: Normal range of motion. Neck supple. No JVD present. No thyromegaly present.  Cardiovascular: Normal rate, regular rhythm and normal heart sounds.  No murmur heard. No BLE edema. Pulmonary/Chest: Effort normal and breath sounds normal. No respiratory distress. She has no wheezes.  Skin: Skin is warm and dry. No rash noted. No erythema.  Psychiatric: She has a normal mood and affect. Her behavior is normal. Judgment and thought content normal.    Lab Results  Component Value Date   WBC 4.1* 09/30/2011   HGB 11.3* 09/30/2011   HCT 33.4* 09/30/2011   PLT 215.0 09/30/2011   CHOL 155 09/30/2011   TRIG 125.0 09/30/2011   HDL 46.70 09/30/2011   LDLDIRECT 141.3 10/14/2008   ALT 19 09/30/2011   AST 20 09/30/2011   NA 142 09/30/2011   K 4.7 09/30/2011   CL 109 09/30/2011   CREATININE 1.9* 09/30/2011   BUN 30* 09/30/2011   CO2 27 09/30/2011   TSH 2.09 09/28/2010   INR 2.4 03/30/2010   HGBA1C 6.3 09/30/2011        Assessment & Plan:  see problem list. Medications and labs reviewed today.

## 2012-01-13 NOTE — Patient Instructions (Signed)
It was good to see you today. We have reviewed your prior records including labs and tests today Medications reviewed, no changes at this time. Refill on medication(s) as discussed today. Work on lifestyle changes as discussed (low fat, low carb, increased protein diet; improved exercise efforts; weight loss) to control sugar, blood pressure and cholesterol levels and/or reduce risk of developing other medical problems. Look into [email or web address] or other type of food journal to assist you in this process. Please schedule followup in 6 months, call sooner if problems.

## 2012-01-13 NOTE — Assessment & Plan Note (Signed)
h[facility] 09/2009 - now follows with endo (balan) Weight loss recommended, pt reports improved cbgs On ASA, statin and ARB Eye check annually - [facility] Lab Results  Component Value Date   HGBA1C 7.0* 01/10/2012

## 2012-01-13 NOTE — Assessment & Plan Note (Signed)
on pravastain The current medical regimen is effective;  continue present plan and medications.

## 2012-01-13 NOTE — Assessment & Plan Note (Signed)
The current medical regimen is effective;  continue present plan and medications. BP Readings from Last 3 Encounters:  01/13/12 120/76  01/04/12 140/78  11/29/11 132/68

## 2012-01-13 NOTE — Assessment & Plan Note (Signed)
Follows with sleep pulm - on CAPA qhs The patient is asked to make an attempt to improve diet and exercise patterns to aid in medical management of this problem.

## 2012-01-27 ENCOUNTER — Telehealth: Payer: Self-pay | Admitting: *Deleted

## 2012-01-27 DIAGNOSIS — E119 Type 2 diabetes mellitus without complications: Secondary | ICD-10-CM

## 2012-01-27 MED ORDER — INSULIN GLARGINE 100 UNIT/ML ~~LOC~~ SOLN: SUBCUTANEOUS | Status: DC

## 2012-01-27 NOTE — Telephone Encounter (Signed)
ok 

## 2012-01-27 NOTE — Telephone Encounter (Signed)
Rec fax from pharmacy stating per the patient her dose is 30 units bid and they are requesting a new Rx. Please advise.

## 2012-02-27 ENCOUNTER — Telehealth: Payer: Self-pay | Admitting: Internal Medicine

## 2012-02-27 MED ORDER — FUROSEMIDE 20 MG PO TABS: 20.0000 mg | ORAL_TABLET | Freq: Every day | ORAL | Status: DC

## 2012-02-27 NOTE — Telephone Encounter (Signed)
The patient called and is hoping to get a refill for furosemide 20mg  sent to the [facility].  She states she takes the medicine twice per day, but it originally filled for once per day.   Pt Callback - [phone] (okay to leave detailed msg)

## 2012-02-28 ENCOUNTER — Other Ambulatory Visit: Payer: Self-pay

## 2012-02-28 MED ORDER — FUROSEMIDE 20 MG PO TABS: 20.0000 mg | ORAL_TABLET | Freq: Two times a day (BID) | ORAL | Status: DC

## 2012-05-30 ENCOUNTER — Encounter: Payer: Self-pay | Admitting: Internal Medicine

## 2012-05-30 ENCOUNTER — Ambulatory Visit (INDEPENDENT_AMBULATORY_CARE_PROVIDER_SITE_OTHER): Payer: No Typology Code available for payment source | Admitting: Internal Medicine

## 2012-05-30 VITALS — BP 151/83 | HR 78 | Temp 98.1°F | Ht 62.0 in | Wt 246.4 lb

## 2012-05-30 DIAGNOSIS — Z79899 Other long term (current) drug therapy: Secondary | ICD-10-CM

## 2012-05-30 DIAGNOSIS — E119 Type 2 diabetes mellitus without complications: Secondary | ICD-10-CM

## 2012-05-30 DIAGNOSIS — I10 Essential (primary) hypertension: Secondary | ICD-10-CM

## 2012-05-30 DIAGNOSIS — N183 Chronic kidney disease, stage 3 unspecified: Secondary | ICD-10-CM

## 2012-05-30 DIAGNOSIS — E785 Hyperlipidemia, unspecified: Secondary | ICD-10-CM

## 2012-05-30 DIAGNOSIS — Z0000 Encounter for general adult medical examination without abnormal findings: Secondary | ICD-10-CM | POA: Insufficient documentation

## 2012-05-30 DIAGNOSIS — D649 Anemia, unspecified: Secondary | ICD-10-CM

## 2012-05-30 DIAGNOSIS — G4733 Obstructive sleep apnea (adult) (pediatric): Secondary | ICD-10-CM

## 2012-05-30 DIAGNOSIS — I129 Hypertensive chronic kidney disease with stage 1 through stage 4 chronic kidney disease, or unspecified chronic kidney disease: Secondary | ICD-10-CM

## 2012-05-30 LAB — POCT GLYCOSYLATED HEMOGLOBIN (HGB A1C): Hemoglobin A1C: 8

## 2012-05-30 MED ORDER — PRAVASTATIN SODIUM 40 MG PO TABS: 40.0000 mg | ORAL_TABLET | Freq: Every day | ORAL | Status: DC

## 2012-05-30 MED ORDER — POLYSACCHARIDE IRON COMPLEX 150 MG PO CAPS: 150.0000 mg | ORAL_CAPSULE | Freq: Every day | ORAL | Status: DC

## 2012-05-30 MED ORDER — FUROSEMIDE 20 MG PO TABS: 20.0000 mg | ORAL_TABLET | Freq: Two times a day (BID) | ORAL | Status: DC

## 2012-05-30 MED ORDER — AMLODIPINE BESYLATE 10 MG PO TABS: 10.0000 mg | ORAL_TABLET | Freq: Every day | ORAL | Status: DC

## 2012-05-30 MED ORDER — GLUCOSE BLOOD VI STRP: ORAL_STRIP | Status: DC

## 2012-05-30 MED ORDER — CARVEDILOL 25 MG PO TABS: 25.0000 mg | ORAL_TABLET | Freq: Two times a day (BID) | ORAL | Status: DC

## 2012-05-30 MED ORDER — INSULIN LISPRO 100 UNIT/ML ~~LOC~~ SOLN: 10.0000 [IU] | SUBCUTANEOUS | Status: DC | PRN

## 2012-05-30 MED ORDER — INSULIN GLARGINE 100 UNIT/ML ~~LOC~~ SOLN: SUBCUTANEOUS | Status: DC

## 2012-05-30 MED ORDER — ALBUTEROL SULFATE HFA 108 (90 BASE) MCG/ACT IN AERS: 1.0000 | INHALATION_SPRAY | RESPIRATORY_TRACT | Status: DC | PRN

## 2012-05-30 MED ORDER — LOSARTAN POTASSIUM 100 MG PO TABS: 100.0000 mg | ORAL_TABLET | Freq: Every day | ORAL | Status: DC

## 2012-05-30 NOTE — Assessment & Plan Note (Signed)
Last LDL was 83, well controlled on pravastatin.  -Continue pravastatin 40 mg daily

## 2012-05-30 NOTE — Assessment & Plan Note (Signed)
Patient with history of CKD stage III, followed by nephrology at [facility] every 6 months. Last GFR was 35 and she does have macro proteinuria. She is on an ARB.  -Return to clinic in 3 months for BMP, anemia panel, intact PTH

## 2012-05-30 NOTE — Assessment & Plan Note (Signed)
I do not see a recent anemia panel in her system. It is likely that her anemia is from chronic kidney disease.  -Anemia panel at next visit -Continue iron supplementation for now

## 2012-05-30 NOTE — Patient Instructions (Addendum)
Please return to clinic in 12 months.

## 2012-05-30 NOTE — Progress Notes (Signed)
Internal Medicine Clinic Visit    HPI:  Patricia Morgan is a 62 y.o. year old female with a history of hypertension, type 2 diabetes, CKD stage III, pulmonary embolus in 2011, who presents to establish care.  She states she has been doing well. She did not bring her glucometer to today's visit but she will bring in the future.  Lost insurance after she lost her job in November 2013. Used to work at [employer] for [employer] follow up.  Breast cancer hx. Total remission, discharge from her oncologist. She did have a mammogram and Pap in 2013 at [facility].   Takes insulin as prescribed, no problems with medications. Snacks at night, admits to dietary noncompliance.  She denies any lower 70 edema, chest pain, shortness of breath, hyperglycemia, hypoglycemia, changes in her vision.    Past Medical History  Diagnosis Date  . OBSTRUCTIVE SLEEP APNEA     CPAP  . PULMONARY EMBOLISM 09/24/2008    anticoag thru 03/2010  . Proteinuria   . BREAST CANCER, HX OF 1995    Right Breast  . ANEMIA-NOS   . DIABETES MELLITUS, TYPE II   . HYPERLIPIDEMIA   . HYPERTENSION   . CHRONIC KIDNEY DISEASE STAGE III (MODERATE)     Past Surgical History  Procedure Date  . Mastectomy 1995    Right  . Insertion port a cath   . Port-a-cath removal      ROS:  A complete review of systems was otherwise negative, except as noted in the HPI.  Allergies: Review of patient's allergies indicates no known allergies.  Medications: Current Outpatient Prescriptions  Medication Sig Dispense Refill  . albuterol (PROAIR HFA) 108 (90 BASE) MCG/ACT inhaler Inhale 2 puffs into the lungs every 4 (four) hours as needed for wheezing or shortness of breath.  3 Inhaler  1  . Alcohol Swabs (ALCOHOL PREP) 70 % PADS Use as directed  200 each  5  . amLODipine (NORVASC) 10 MG tablet Take 1 tablet (10 mg total) by mouth daily.  90 tablet  3  . Armodafinil 150 MG tablet Take 1 tablet (150 mg total) by mouth daily.  30 tablet   5  . aspirin 325 MG tablet Take 325 mg by mouth daily.        . B-D UF III MINI PEN NEEDLES 31G X 5 MM MISC USE AS DIRECTED WITH LANTUS AND HUMALOG (5 INJECTIONS DAILY)  200 each  6  . carvedilol (COREG) 25 MG tablet Take 1 tablet (25 mg total) by mouth 2 (two) times daily with a meal.  180 tablet  3  . diphenhydrAMINE (BENADRYL) 25 MG tablet Take 25 mg by mouth Nightly.        . furosemide (LASIX) 20 MG tablet Take 1 tablet (20 mg total) by mouth 2 (two) times daily.  180 tablet  1  . glucose blood (ONE TOUCH ULTRA TEST) test strip Use as instructed  300 each  1  . insulin glargine (LANTUS SOLOSTAR) 100 UNIT/ML injection 30 units sq BID  (or as per endo - dr. Chalmers Cater)  30 mL  1  . insulin lispro (HUMALOG KWIKPEN) 100 UNIT/ML injection Inject 10 Units into the skin as needed.  30 mL  1  . iron polysaccharides (FERREX 150) 150 MG capsule Take 1 capsule (150 mg total) by mouth daily.  90 capsule  1  . losartan (COZAAR) 100 MG tablet Take 1 tablet (100 mg total) by mouth daily.  90 tablet  2  . ONETOUCH DELICA LANCETS MISC As instructed  300 each  1  . pravastatin (PRAVACHOL) 40 MG tablet Take 1 tablet (40 mg total) by mouth daily.  90 tablet  0  . [DISCONTINUED] fluticasone (FLONASE) 50 MCG/ACT nasal spray Place 2 sprays into the nose daily.  16 g  2    History   Social History  . Marital Status: Divorced    Spouse Name: N/A    Number of Children: N/A  . Years of Education: N/A   Occupational History  . Not on file.   Social History Main Topics  . Smoking status: Never Smoker   . Smokeless tobacco: Not on file     Comment: Lives alone-divorced  . Alcohol Use: No  . Drug Use: No  . Sexually Active: Not on file   Other Topics Concern  . Not on file   Social History Narrative  . No narrative on file    family history includes Diabetes in her sister.  Physical Exam Blood pressure 151/83, pulse 78, temperature 98.1 F (36.7 C), temperature source Oral, height 5\' 2"  (1.575 m),  weight 246 lb 6.4 oz (111.766 kg), SpO2 97.00%. General:  No acute distress, alert and oriented x 3, well-appearing  HEENT:  PERRL, EOMI, no lymphadenopathy, moist mucous membranes Cardiovascular:  Regular rate and rhythm, no murmurs, rubs or gallops Respiratory:  Clear to auscultation bilaterally, no wheezes, rales, or rhonchi Abdomen:  Soft, nondistended, nontender, normoactive bowel sounds Extremities:  Warm and well-perfused, 2+ pedal pulses, trace edema bilaterally.  Skin: Warm, dry, no rashes Neuro: Not anxious appearing, no depressed mood, normal affect  Labs: Lab Results  Component Value Date   CREATININE 1.9* 09/30/2011   BUN 30* 09/30/2011   NA 142 09/30/2011   K 4.7 09/30/2011   CL 109 09/30/2011   CO2 27 09/30/2011   Lab Results  Component Value Date   WBC 4.1* 09/30/2011   HGB 11.3* 09/30/2011   HCT 33.4* 09/30/2011   MCV 91.0 09/30/2011   PLT 215.0 09/30/2011      Assessment and Plan:    FOLLOWUP: Loubertha Beld will follow back up in our clinic in approximately one to 2 months. Bich Franca knows to call out clinic in the meantime with any questions or new issues.

## 2012-05-30 NOTE — Assessment & Plan Note (Addendum)
Patient has well-controlled type 2 diabetes. Last A1c was 7 in August. She continues to take her insulin as prescribed, Lantus twice a day and Humalog with meals. She was followed by endocrinology after a hospitalization for HHS in 2011. Patient appears to be well educated about her diabetes and wants to "be well" so she is fair motivated to take her medicines.  -Will check A1c today -Continue current regimen for now, Lantus 30 units twice a day, Humalog with meals -Diabetic educational materials given during this visit

## 2012-05-30 NOTE — Assessment & Plan Note (Signed)
Patient is up-to-date on her Pap smear and mammogram, both of these tests were performed within the last 6 months. She is also up-to-date on her colonoscopy and diabetic eye and foot exam.

## 2012-05-30 NOTE — Assessment & Plan Note (Signed)
Patient states she has been compliant with her CPAP. She uses it every night and states that she does not have any residual hypersomnolence during the day. She tried taking a stimulant for hypersomnolence but she did not like how she felt on that medication and further more, does not need this anymore.  -Continue CPAP at night -Discussed diet and exercise at next visit

## 2012-05-30 NOTE — Assessment & Plan Note (Signed)
Patient has a history of hypertension and her blood pressure is slightly elevated today 150 over 80s.  -Recheck in 1-2 months -Continue current regimen for now, refills given -Will also check BMP next visit

## 2012-06-05 ENCOUNTER — Telehealth: Payer: Self-pay | Admitting: *Deleted

## 2012-06-05 DIAGNOSIS — E119 Type 2 diabetes mellitus without complications: Secondary | ICD-10-CM

## 2012-06-05 DIAGNOSIS — D649 Anemia, unspecified: Secondary | ICD-10-CM

## 2012-06-05 NOTE — Telephone Encounter (Signed)
Call from pt - states her medications were not called to [facility]; they were sent to [facility] instead. Pt states she has the orange card. I called [facility]; I called in the meds they do have.  Losartan and Norvasc had to be called to [facility].   [facility] do not have Ferrex (iron) but has Ferous Sulfat 325mg  if you want to change medication. They need clarification on Humalog kwikpen and Lantus Solostar - do u want vials (which were ordered) or the pens?   Also pt needs to go and apply to the [facility] program - some meds come from the health dept, others from [facility]. I called and left a message for the pt. And to call back for any questions.

## 2012-06-07 MED ORDER — FERROUS SULFATE 325 (65 FE) MG PO TBEC: 325.0000 mg | DELAYED_RELEASE_TABLET | Freq: Every day | ORAL | Status: DC

## 2012-06-07 NOTE — Telephone Encounter (Signed)
Ferrous Sulfate rx called to [facility].

## 2012-06-12 NOTE — Telephone Encounter (Signed)
Received a message from pt yesterday. Stated she was able to get her medications from [facility] and [place]; received samples of insulin also. And completed the paperwork at [facility]. She was very grateful.

## 2012-07-16 ENCOUNTER — Ambulatory Visit (INDEPENDENT_AMBULATORY_CARE_PROVIDER_SITE_OTHER): Payer: No Typology Code available for payment source | Admitting: Internal Medicine

## 2012-07-16 ENCOUNTER — Encounter: Payer: Self-pay | Admitting: Internal Medicine

## 2012-07-16 VITALS — BP 131/69 | HR 88 | Wt 244.4 lb

## 2012-07-16 DIAGNOSIS — K5900 Constipation, unspecified: Secondary | ICD-10-CM | POA: Insufficient documentation

## 2012-07-16 DIAGNOSIS — I10 Essential (primary) hypertension: Secondary | ICD-10-CM

## 2012-07-16 DIAGNOSIS — I129 Hypertensive chronic kidney disease with stage 1 through stage 4 chronic kidney disease, or unspecified chronic kidney disease: Secondary | ICD-10-CM

## 2012-07-16 DIAGNOSIS — R809 Proteinuria, unspecified: Secondary | ICD-10-CM

## 2012-07-16 DIAGNOSIS — E119 Type 2 diabetes mellitus without complications: Secondary | ICD-10-CM

## 2012-07-16 LAB — BASIC METABOLIC PANEL
Chloride: 105 mEq/L (ref 96–112)
Potassium: 4.7 mEq/L (ref 3.5–5.3)
Sodium: 140 mEq/L (ref 135–145)

## 2012-07-16 LAB — ANEMIA PANEL
Ferritin: 163 ng/mL (ref 10–291)
Folate: 11.2 ng/mL
RBC.: 3.98 MIL/uL (ref 3.87–5.11)
Retic Ct Pct: 2.1 % (ref 0.4–2.3)
Vitamin B-12: 949 pg/mL — ABNORMAL HIGH (ref 211–911)

## 2012-07-16 MED ORDER — LISINOPRIL 40 MG PO TABS: 40.0000 mg | ORAL_TABLET | Freq: Every day | ORAL | Status: DC

## 2012-07-16 MED ORDER — INSULIN LISPRO 100 UNIT/ML ~~LOC~~ SOLN: 10.0000 [IU] | SUBCUTANEOUS | Status: DC | PRN

## 2012-07-16 MED ORDER — SENNA-DOCUSATE SODIUM 8.6-50 MG PO TABS: 2.0000 | ORAL_TABLET | Freq: Every day | ORAL | Status: DC

## 2012-07-16 MED ORDER — INSULIN GLARGINE 100 UNIT/ML ~~LOC~~ SOLN: SUBCUTANEOUS | Status: DC

## 2012-07-16 MED ORDER — INSULIN ASPART 100 UNIT/ML ~~LOC~~ SOLN: SUBCUTANEOUS | Status: DC

## 2012-07-16 MED ORDER — LOSARTAN POTASSIUM 100 MG PO TABS: 100.0000 mg | ORAL_TABLET | Freq: Every day | ORAL | Status: DC

## 2012-07-16 MED ORDER — METOPROLOL SUCCINATE ER 25 MG PO TB24: 100.0000 mg | ORAL_TABLET | Freq: Every day | ORAL | Status: DC

## 2012-07-16 NOTE — Patient Instructions (Addendum)
Please [facility] up medications from [facility]  Return to clinic in 4-6 weeks for blood pressure check.  Meet with Clarice Pole as soon as possible.

## 2012-07-16 NOTE — Progress Notes (Signed)
Internal Medicine Clinic Visit    HPI:  Patricia Morgan is a 62 y.o. year old female with a history of hypertension, type 2 diabetes, CKD stage III, pulmonary embolus in 2011, chronic anemia, who presents for follow up.  BP: well controlled.  DM:  Has wanted to work out more often. Wants to start dating so she is motivated to lose weight. Checking blood sugars. No hypoglycemia. Sugars have been good recently. Dietary indiscretions.  CKD:   She denies any lower extremity edema, chest pain, shortness of breath, hyperglycemia, hypoglycemia, changes in her vision.    Past Medical History  Diagnosis Date  . OBSTRUCTIVE SLEEP APNEA     CPAP  . PULMONARY EMBOLISM 09/24/2008    anticoag thru 03/2010  . Proteinuria   . BREAST CANCER, HX OF 1995    Right Breast  . ANEMIA-NOS   . DIABETES MELLITUS, TYPE II   . HYPERLIPIDEMIA   . HYPERTENSION   . CHRONIC KIDNEY DISEASE STAGE III (MODERATE)     Past Surgical History  Procedure Laterality Date  . Mastectomy  1995    Right  . Insertion port a cath    . Port-a-cath removal       ROS:  A complete review of systems was otherwise negative, except as noted in the HPI.  Allergies: Review of patient's allergies indicates no known allergies.  Medications: Current Outpatient Prescriptions  Medication Sig Dispense Refill  . albuterol (PROAIR HFA) 108 (90 BASE) MCG/ACT inhaler Inhale 1-2 puffs into the lungs every 4 (four) hours as needed for wheezing or shortness of breath.  3 Inhaler  2  . Alcohol Swabs (ALCOHOL PREP) 70 % PADS Use as directed  200 each  5  . amLODipine (NORVASC) 10 MG tablet Take 1 tablet (10 mg total) by mouth daily.  90 tablet  3  . Armodafinil 150 MG tablet Take 1 tablet (150 mg total) by mouth daily.  30 tablet  5  . aspirin 325 MG tablet Take 325 mg by mouth daily.        . B-D UF III MINI PEN NEEDLES 31G X 5 MM MISC USE AS DIRECTED WITH LANTUS AND HUMALOG (5 INJECTIONS DAILY)  200 each  6  . carvedilol (COREG)  25 MG tablet Take 1 tablet (25 mg total) by mouth 2 (two) times daily with a meal.  60 tablet  6  . diphenhydrAMINE (BENADRYL) 25 MG tablet Take 25 mg by mouth Nightly.        . ferrous sulfate 325 (65 FE) MG EC tablet Take 1 tablet (325 mg total) by mouth daily.  60 tablet  6  . furosemide (LASIX) 20 MG tablet Take 1 tablet (20 mg total) by mouth 2 (two) times daily.  90 tablet  4  . glucose blood (ONE TOUCH ULTRA TEST) test strip Use as instructed  300 each  3  . insulin glargine (LANTUS SOLOSTAR) 100 UNIT/ML injection 30 units sq BID  (or as per endo - dr. Chalmers Cater)  30 mL  4  . insulin lispro (HUMALOG KWIKPEN) 100 UNIT/ML injection Inject 10 Units into the skin as needed.  30 mL  4  . iron polysaccharides (FERREX 150) 150 MG capsule Take 1 capsule (150 mg total) by mouth daily.  90 capsule  3  . losartan (COZAAR) 100 MG tablet Take 1 tablet (100 mg total) by mouth daily.  90 tablet  3  . ONETOUCH DELICA LANCETS MISC As instructed  300 each  1  .  pravastatin (PRAVACHOL) 40 MG tablet Take 1 tablet (40 mg total) by mouth daily.  90 tablet  6  . [DISCONTINUED] fluticasone (FLONASE) 50 MCG/ACT nasal spray Place 2 sprays into the nose daily.  16 g  2   No current facility-administered medications for this visit.    History   Social History  . Marital Status: Divorced    Spouse Name: N/A    Number of Children: N/A  . Years of Education: N/A   Occupational History  . Not on file.   Social History Main Topics  . Smoking status: Never Smoker   . Smokeless tobacco: Not on file     Comment: Lives alone-divorced  . Alcohol Use: No  . Drug Use: No  . Sexually Active: Not on file   Other Topics Concern  . Not on file   Social History Narrative  . No narrative on file    family history includes Diabetes in her sister.  Physical Exam There were no vitals taken for this visit. General:  No acute distress, alert and oriented x 3, well-appearing  HEENT:  PERRL, EOMI, no lymphadenopathy,  moist mucous membranes Cardiovascular:  Regular rate and rhythm, no murmurs, rubs or gallops Respiratory:  Clear to auscultation bilaterally, no wheezes, rales, or rhonchi Abdomen:  Soft, nondistended, nontender, normoactive bowel sounds Extremities:  Warm and well-perfused, 2+ pedal pulses, trace edema bilaterally.  Skin: Warm, dry, no rashes Neuro: Not anxious appearing, no depressed mood, normal affect  Labs: Lab Results  Component Value Date   CREATININE 1.9* 09/30/2011   BUN 30* 09/30/2011   NA 142 09/30/2011   K 4.7 09/30/2011   CL 109 09/30/2011   CO2 27 09/30/2011   Lab Results  Component Value Date   WBC 4.1* 09/30/2011   HGB 11.3* 09/30/2011   HCT 33.4* 09/30/2011   MCV 91.0 09/30/2011   PLT 215.0 09/30/2011      Assessment and Plan:    FOLLOWUP: Patricia Morgan will follow back up in our clinic in approximately one to 2 months. Patricia Morgan knows to call our clinic in the meantime with any questions or new issues.

## 2012-07-17 LAB — MICROALBUMIN / CREATININE URINE RATIO
Microalb Creat Ratio: 811.7 mg/g — ABNORMAL HIGH (ref 0.0–30.0)
Microalb, Ur: 121.43 mg/dL — ABNORMAL HIGH (ref 0.00–1.89)

## 2012-07-17 NOTE — Assessment & Plan Note (Signed)
Patient complaining of mild constipation on the iron replacement. Recommended over-the-counter Senokot twice a day as needed for constipation.

## 2012-07-17 NOTE — Assessment & Plan Note (Signed)
Patient has stable CK D. stage III, she was previously followed by [facility] until she lost insurance. Blood pressure is 131/69 today, last hemoglobin A1c 8.  -referral to nephrology for further management -Continue losartan -BMP, urine microalbumin/Cr today -Request records from patient's nephrologist

## 2012-07-17 NOTE — Assessment & Plan Note (Signed)
Lab Results  Component Value Date   HGBA1C 8.0 05/30/2012   HGBA1C 7.0* 01/10/2012   HGBA1C 6.3 09/30/2011     Assessment:  Diabetes control: fair control  Progress toward A1C goal:  unable to assess   Plan:  Medications:  continue current medications  Home glucose monitoring:   Frequency:     Timing:    Instruction/counseling given: reminded to bring blood glucose meter & log to each visit and discussed the need for weight loss  Educational resources provided: brochure  Self management tools provided: copy of home glucose meter download;home glucose logbook  Other plans:   -Prescription given for Flex pen so patient can get pen needles through [facility] program -Continue 30 units Lantus twice a day and 10 units Humalog 3 times a day with meals -A1c at next visit

## 2012-07-17 NOTE — Assessment & Plan Note (Signed)
BP Readings from Last 3 Encounters:  07/16/12 131/69  05/30/12 151/83  01/13/12 120/76    Lab Results  Component Value Date   NA 140 07/16/2012   K 4.7 07/16/2012   CREATININE 2.04* 07/16/2012    -Continue losartan -Continue amlodipine -Switch Coreg to Toprol XL 100 mg daily as patient can get this medication for free with [facility] program -return to clinic in 4-6 weeks to check blood pressure/HR as we are switching her beta blocker to Toprol XR and may need to adjust dose

## 2012-07-18 ENCOUNTER — Ambulatory Visit: Payer: 59 | Admitting: Internal Medicine

## 2012-07-18 DIAGNOSIS — Z0289 Encounter for other administrative examinations: Secondary | ICD-10-CM

## 2012-09-03 ENCOUNTER — Encounter: Payer: Self-pay | Admitting: *Deleted

## 2012-09-13 ENCOUNTER — Encounter: Payer: Self-pay | Admitting: Internal Medicine

## 2012-09-13 ENCOUNTER — Ambulatory Visit (INDEPENDENT_AMBULATORY_CARE_PROVIDER_SITE_OTHER): Payer: No Typology Code available for payment source | Admitting: Internal Medicine

## 2012-09-13 VITALS — BP 132/78 | HR 76 | Temp 99.8°F | Ht 62.0 in | Wt 239.3 lb

## 2012-09-13 DIAGNOSIS — E785 Hyperlipidemia, unspecified: Secondary | ICD-10-CM

## 2012-09-13 DIAGNOSIS — E119 Type 2 diabetes mellitus without complications: Secondary | ICD-10-CM

## 2012-09-13 DIAGNOSIS — Z0000 Encounter for general adult medical examination without abnormal findings: Secondary | ICD-10-CM

## 2012-09-13 LAB — BASIC METABOLIC PANEL WITH GFR
BUN: 35 mg/dL — ABNORMAL HIGH (ref 6–23)
Calcium: 10.1 mg/dL (ref 8.4–10.5)
GFR, Est African American: 28 mL/min — ABNORMAL LOW
Glucose, Bld: 129 mg/dL — ABNORMAL HIGH (ref 70–99)

## 2012-09-13 LAB — LIPID PANEL
HDL: 45 mg/dL (ref >39)
LDL Cholesterol: 111 mg/dL — ABNORMAL HIGH (ref 0–99)
VLDL: 28 mg/dL (ref 0–40)

## 2012-09-13 MED ORDER — LISINOPRIL 40 MG PO TABS: 40.0000 mg | ORAL_TABLET | Freq: Every day | ORAL | Status: DC

## 2012-09-13 NOTE — Progress Notes (Signed)
Internal Medicine Clinic Visit    HPI:  Patricia Morgan is a 62 y.o. year old female with a history of hypertension, type 2 diabetes, CKD stage III, pulmonary embolus in 2011, chronic anemia, who presents for follow up.  Patient states that she has started going to gym, 4 days per week, and feels wonderful. She does aerobic exercises and some strength training as well. She has improved her diet, cutting out snacks and eating fried foods. She has lost about 3 pounds but is hopeful to lose more in the future.  Patient states that she is no longer able to afford losartan since her pharmacy closed. She asks for an alternative medication that is less expensive.  DM:  Meter broke several months ago, checks her sugars only once per week using her sister's meter, denies having any values over 200. No symptoms of hyper or hypoglycemia. She currently takes 30 u lantus BID, 10u TID with meals.  She denies any lower extremity edema, chest pain, shortness of breath, hyperglycemia, hypoglycemia, changes in her vision.    Past Medical History  Diagnosis Date  . OBSTRUCTIVE SLEEP APNEA     CPAP  . PULMONARY EMBOLISM 09/24/2008    anticoag thru 03/2010  . Proteinuria   . BREAST CANCER, HX OF 1995    Right Breast  . ANEMIA-NOS   . DIABETES MELLITUS, TYPE II   . HYPERLIPIDEMIA   . HYPERTENSION   . CHRONIC KIDNEY DISEASE STAGE III (MODERATE)     Past Surgical History  Procedure Laterality Date  . Mastectomy  1995    Right  . Insertion port a cath    . Port-a-cath removal       ROS:  A complete review of systems was otherwise negative, except as noted in the HPI.  Allergies: Review of patient's allergies indicates no known allergies.  Medications: Current Outpatient Prescriptions  Medication Sig Dispense Refill  . albuterol (PROAIR HFA) 108 (90 BASE) MCG/ACT inhaler Inhale 1-2 puffs into the lungs every 4 (four) hours as needed for wheezing or shortness of breath.  3 Inhaler  2  . Alcohol  Swabs (ALCOHOL PREP) 70 % PADS Use as directed  200 each  5  . amLODipine (NORVASC) 10 MG tablet Take 1 tablet (10 mg total) by mouth daily.  90 tablet  3  . Armodafinil 150 MG tablet Take 1 tablet (150 mg total) by mouth daily.  30 tablet  5  . aspirin 325 MG tablet Take 325 mg by mouth daily.        . B-D UF III MINI PEN NEEDLES 31G X 5 MM MISC USE AS DIRECTED WITH LANTUS AND HUMALOG (5 INJECTIONS DAILY)  200 each  6  . diphenhydrAMINE (BENADRYL) 25 MG tablet Take 25 mg by mouth Nightly.        . ferrous sulfate 325 (65 FE) MG EC tablet Take 1 tablet (325 mg total) by mouth daily.  60 tablet  6  . furosemide (LASIX) 20 MG tablet Take 1 tablet (20 mg total) by mouth 2 (two) times daily.  90 tablet  4  . glucose blood (ONE TOUCH ULTRA TEST) test strip Use as instructed  300 each  3  . insulin aspart (NOVOLOG FLEXPEN) 100 UNIT/ML injection Inject 31 u subcutaneously three times per day as directed  3 mL  12  . insulin glargine (LANTUS SOLOSTAR) 100 UNIT/ML injection Inject 30 units into skin BID as directed  30 mL  6  . insulin lispro (  HUMALOG KWIKPEN) 100 UNIT/ML injection Inject 10 Units into the skin as needed. Three times per day with meals  30 mL  6  . iron polysaccharides (FERREX 150) 150 MG capsule Take 1 capsule (150 mg total) by mouth daily.  90 capsule  3  . lisinopril (PRINIVIL,ZESTRIL) 40 MG tablet Take 1 tablet (40 mg total) by mouth daily.  30 tablet  6  . metoprolol succinate (TOPROL XL) 25 MG 24 hr tablet Take 4 tablets (100 mg total) by mouth daily.  30 tablet  11  . ONETOUCH DELICA LANCETS MISC As instructed  300 each  1  . pravastatin (PRAVACHOL) 40 MG tablet Take 1 tablet (40 mg total) by mouth daily.  90 tablet  6  . sennosides-docusate sodium (SENOKOT-S) 8.6-50 MG tablet Take 2 tablets by mouth daily.      . [DISCONTINUED] fluticasone (FLONASE) 50 MCG/ACT nasal spray Place 2 sprays into the nose daily.  16 g  2   No current facility-administered medications for this visit.     History   Social History  . Marital Status: Divorced    Spouse Name: N/A    Number of Children: N/A  . Years of Education: N/A   Occupational History  . Not on file.   Social History Main Topics  . Smoking status: Never Smoker   . Smokeless tobacco: Not on file     Comment: Lives alone-divorced  . Alcohol Use: No  . Drug Use: No  . Sexually Active: Not on file   Other Topics Concern  . Not on file   Social History Narrative  . No narrative on file    family history includes Diabetes in her sister.  Physical Exam Blood pressure 132/78, pulse 76, temperature 99.8 F (37.7 C), temperature source Oral, height 5\' 2"  (1.575 m), weight 239 lb 4.8 oz (108.546 kg), SpO2 98.00%. General:  No acute distress, alert and oriented x 3, well-appearing AAF HEENT:  PERRL, EOMI, no lymphadenopathy, moist mucous membranes Cardiovascular:  Regular rate and rhythm, no murmurs, rubs or gallops Respiratory:  Clear to auscultation bilaterally, no wheezes, rales, or rhonchi Abdomen:  Soft, obese, nondistended, nontender, normoactive bowel sounds Extremities:  Warm and well-perfused, 2+ pedal pulses, trace edema bilaterally.  Skin: Warm, dry, no rashes Neuro: Not anxious appearing, no depressed mood, normal affect  Labs: Lab Results  Component Value Date   CREATININE 2.04* 07/16/2012   BUN 36* 07/16/2012   NA 140 07/16/2012   K 4.7 07/16/2012   CL 105 07/16/2012   CO2 25 07/16/2012   Lab Results  Component Value Date   WBC 4.1* 09/30/2011   HGB 11.3* 09/30/2011   HCT 33.4* 09/30/2011   MCV 91.0 09/30/2011   PLT 215.0 09/30/2011      Assessment and Plan:    FOLLOWUP: Patricia Morgan will follow back up in our clinic in approximately one to 2 months. Patricia Morgan knows to call our clinic in the meantime with any questions or new issues.

## 2012-09-13 NOTE — Patient Instructions (Addendum)
  General Instructions:  Please return to clinic in 3 months   Progress Toward Treatment Goals:  Treatment Goal 09/13/2012  Hemoglobin A1C at goal  Blood pressure at goal    Self Care Goals & Plans:  Self Care Goal 09/13/2012  Manage my medications take my medicines as prescribed; bring my medications to every visit; refill my medications on time  Monitor my health keep track of my blood glucose; bring my glucose meter and log to each visit; keep track of my blood pressure  Eat healthy foods eat foods that are low in salt; eat baked foods instead of fried foods  Be physically active find an activity I enjoy    Home Blood Glucose Monitoring 09/13/2012  Check my blood sugar 3 times a day  When to check my blood sugar before breakfast; before meals

## 2012-09-14 ENCOUNTER — Telehealth: Payer: Self-pay | Admitting: *Deleted

## 2012-09-14 NOTE — Assessment & Plan Note (Signed)
Stable. Blood pressure under good control and A1c is at goal.  -Start lisinopril 40 mg, discontinue losartan as patient can no longer afford -Continue to optimize modifiable risk factors for kidney disease progression including blood pressure and diabetes -Will continue to follow patient -BMP today

## 2012-09-14 NOTE — Telephone Encounter (Signed)
[facility] can provide crestor 10mg  free of charge to pt, will consider a change from pravastatin?

## 2012-09-14 NOTE — Assessment & Plan Note (Signed)
Referral sent for mammogram

## 2012-09-14 NOTE — Assessment & Plan Note (Signed)
Lab Results  Component Value Date   HGBA1C 6.8 09/13/2012   HGBA1C 8.0 05/30/2012   HGBA1C 7.0* 01/10/2012     Assessment: Diabetes control: good control (HgbA1C at goal) Progress toward A1C goal:  at goal Comments: none  Plan: Medications:  continue current medications Home glucose monitoring: Frequency: 3 times a day Timing: before breakfast;before meals Instruction/counseling given: reminded to bring blood glucose meter & log to each visit, discussed the need for weight loss and discussed diet Educational resources provided: brochure Self management tools provided: home glucose logbook Other plans:   Called new glucometer and supplies into her pharmacy today.  Patient is now working out at the gym about 4 times per week. She has only lost several pounds, but she is optimistic about losing more weight in the future. I encouraged her in her weight loss and new exercise program and offered support. Also congratulated her on her decreased A1c today. She is doing a great job and is very motivated to lose weight and keep her health under control.  We discussed watching out for hypoglycemia and reviewed signs and symptoms. She knows to check her blood sugar immediately, drink some juice or eat a candy and call us if she has any signs.

## 2012-09-14 NOTE — Telephone Encounter (Signed)
That will be fine. Can you please call it into the HD? Thanks! Eliezer Lofts

## 2012-09-17 ENCOUNTER — Ambulatory Visit (HOSPITAL_COMMUNITY): Payer: No Typology Code available for payment source

## 2012-09-17 NOTE — Telephone Encounter (Signed)
Called to pharm 

## 2012-09-18 ENCOUNTER — Ambulatory Visit (HOSPITAL_COMMUNITY): Payer: No Typology Code available for payment source

## 2012-09-19 NOTE — Progress Notes (Signed)
Case discussed with Dr. Sissy Hoff at the time of the visit. We reviewed the resident's history and exam and pertinent patient test results. I agree with the assessment, diagnosis and plan of care documented in the resident's note.

## 2012-09-20 ENCOUNTER — Other Ambulatory Visit: Payer: Self-pay | Admitting: Internal Medicine

## 2012-09-20 ENCOUNTER — Ambulatory Visit (HOSPITAL_COMMUNITY)
Admission: RE | Admit: 2012-09-20 | Discharge: 2012-09-20 | Disposition: A | Discharge status: Home / Self Care | Payer: No Typology Code available for payment source | Source: Ambulatory Visit | Attending: Internal Medicine | Admitting: Internal Medicine

## 2012-09-20 DIAGNOSIS — Z0000 Encounter for general adult medical examination without abnormal findings: Secondary | ICD-10-CM

## 2012-09-20 DIAGNOSIS — Z1231 Encounter for screening mammogram for malignant neoplasm of breast: Secondary | ICD-10-CM | POA: Insufficient documentation

## 2012-09-25 ENCOUNTER — Telehealth: Payer: Self-pay | Admitting: *Deleted

## 2012-09-25 NOTE — Telephone Encounter (Signed)
Returned call from [place] [facility] - verbal order given for Crestor 10mg  qty #90 w/ 6 refills; given to [facility].

## 2012-11-22 ENCOUNTER — Other Ambulatory Visit: Payer: Self-pay

## 2012-11-28 ENCOUNTER — Ambulatory Visit: Payer: Self-pay

## 2013-01-04 ENCOUNTER — Ambulatory Visit: Payer: No Typology Code available for payment source | Admitting: Internal Medicine

## 2013-01-04 ENCOUNTER — Encounter: Payer: Self-pay | Admitting: Internal Medicine

## 2013-01-07 ENCOUNTER — Ambulatory Visit: Payer: No Typology Code available for payment source | Admitting: Internal Medicine

## 2013-01-08 ENCOUNTER — Ambulatory Visit (INDEPENDENT_AMBULATORY_CARE_PROVIDER_SITE_OTHER): Payer: No Typology Code available for payment source | Admitting: Internal Medicine

## 2013-01-08 ENCOUNTER — Encounter: Payer: Self-pay | Admitting: Internal Medicine

## 2013-01-08 ENCOUNTER — Ambulatory Visit: Payer: No Typology Code available for payment source | Admitting: Internal Medicine

## 2013-01-08 VITALS — BP 107/68 | HR 98 | Temp 97.9°F | Wt 236.1 lb

## 2013-01-08 DIAGNOSIS — Z0000 Encounter for general adult medical examination without abnormal findings: Secondary | ICD-10-CM

## 2013-01-08 DIAGNOSIS — M545 Low back pain: Secondary | ICD-10-CM

## 2013-01-08 DIAGNOSIS — I10 Essential (primary) hypertension: Secondary | ICD-10-CM

## 2013-01-08 DIAGNOSIS — G4733 Obstructive sleep apnea (adult) (pediatric): Secondary | ICD-10-CM

## 2013-01-08 DIAGNOSIS — E119 Type 2 diabetes mellitus without complications: Secondary | ICD-10-CM

## 2013-01-08 DIAGNOSIS — K5900 Constipation, unspecified: Secondary | ICD-10-CM

## 2013-01-08 DIAGNOSIS — I129 Hypertensive chronic kidney disease with stage 1 through stage 4 chronic kidney disease, or unspecified chronic kidney disease: Secondary | ICD-10-CM

## 2013-01-08 LAB — HM DIABETES EYE EXAM

## 2013-01-08 MED ORDER — DICLOFENAC SODIUM 1 % TD GEL: 2.0000 g | Freq: Four times a day (QID) | TRANSDERMAL | Status: DC

## 2013-01-08 NOTE — Assessment & Plan Note (Addendum)
Eye exam on retinal scanner today. Negative mammogram in 09/2012, recommended screening mammo in 1 year.

## 2013-01-08 NOTE — Assessment & Plan Note (Addendum)
BP Readings from Last 3 Encounters:  01/08/13 107/68  09/13/12 132/78  07/16/12 131/69    Lab Results  Component Value Date   NA 141 09/13/2012   K 4.8 09/13/2012   CREATININE 2.11* 09/13/2012    Assessment: Pt switched from metoprolol BID to Toprol XL at her appt in 07/2012 but states that she has not been taking either medication since due to confusion about the plan.  Blood pressure control: controlled Progress toward BP goal:  at goal  Plan: Discontinue Toprol-XL as pt does not have CAD and BP controlled without this medication.  Follow-up in 3 months for BP check.  Medications:  continue current medications- amlodipine, lisinopril, furosemide Educational resources provided: brochure Self management tools provided: home blood pressure logbook

## 2013-01-08 NOTE — Assessment & Plan Note (Signed)
Pt continues to complain of constipation on iron supplementation.  She is taking OTC Senokot twice daily as needed and states that this usually provides relief.  We discussed adding a stool softener such as Colace if needed, but she does not think this is necessary for now.

## 2013-01-08 NOTE — Assessment & Plan Note (Signed)
Stable.  Previously followed by [facility] but stopped going last year (after she lost insurance).  BP and diabetes controlled.  Pt was referred to nephrology in 07/2012 but did not follow-up.  We discussed referral to nephrology again today, but pt does not wish to go back at this time.   -continue lisinopril -follow-up in 3 months, BMP at that time -consider referral to nephrology at next visit

## 2013-01-08 NOTE — Assessment & Plan Note (Signed)
Pt compliant with her CPAP and sleeping well though noting some nasal bridge tenderness, likely due to mask being too tight. She is trying to contact the [facility] tech to have the mask adjusted.  Denies bruising, nose bleeds, nasal discharge, maxillary sinus tenderness, headaches.  -encouraged weight loss through diet and exercise

## 2013-01-08 NOTE — Assessment & Plan Note (Signed)
Lab Results  Component Value Date   HGBA1C 6.1 01/08/2013   HGBA1C 6.8 09/13/2012   HGBA1C 8.0 05/30/2012     Assessment: Pt congratulated on her A1C!  Good medication compliance with Lantus and Humalog. No symptoms of hypo/hyperglycemia.  Diabetes control: good control (HgbA1C at goal) Progress toward A1C goal:  at goal  Plan: Encouraged weight loss with diet and exercise.  Medications:  continue current medications Home glucose monitoring: Frequency: 3 times a day Timing: before meals Instruction/counseling given: reminded to bring blood glucose meter & log to each visit Educational resources provided: brochure Self management tools provided: copy of home glucose meter download

## 2013-01-08 NOTE — Progress Notes (Signed)
Patient ID: Patricia Morgan, female   DOB: 1950/06/22, 62 y.o.   MRN: [id number]   Subjective:   Patient ID: Patricia Morgan female   DOB: 28-Aug-1950 62 y.o.   MRN: [id number]  HPI: Patricia Morgan is a 62 y.o. woman with history of DM2, HTN, HL, CKD 3, OSA on CPAP, PE in 2011 (not currently on anticoagulation) who presents for follow-up of her chronic medical problems and low back pain.   Pt states she started having central low back approximately 1 week ago that she noticed one night when she got up to go to the bathroom; no history of heavy lifting or twisting, trauma, or falls.  She describes the pain as sharp when she is sitting down or getting up especially from the bed or couch.  The pain does not radiate into her buttocks or legs.  She states the pain is improved from last week, she has been taking Tylenol on and off and staying active by walking and taking her dog out. She does not have pain if she sits in a hard chair with good back support.    Pt is also having some nasal bridge pain where her CPAP mask sits that she notices when she palpates her nose or her glasses sit on the affected area.  She admits to wearing her mask quite tight every night but states she has been doing so for the last 15 years as she is a "rough sleeper."  She is trying to contact the [employer] tech to have the mask adjusted.  Denies bruising, nose bleeds, nasal discharge, maxillary sinus tenderness, headaches.  In terms of diabetes, pt's A1C is well controlled at 6.1% today (6.8% in 5/14).  She reports good medication compliance with Lantus 30 units BID, Humalog 10 units TID with meals. She tries to check her blood sugar 3x daily and brought her meter to clinic today which showed average blood glucose 164 with no hypoglycemic events, highest blood glucose 244.  She admits to some occasional dietary indiscretion as she loves ice cream and "all junk food."  She continues to work on weight loss through diet and exercise.  Denies symptoms of hypo/hyperglycemia.   Pt's blood pressure is well controlled today at 107/68.  Pt does not check her BP at home but denies headache, dizziness, nausea.  Of note, pt's metoprolol was changed to Toprol-XL at her last visit, but she has not been taking these medications as she was confused about the plan.   With regard to her CKD 3, pt states she had been following with nephrology until last year when she stopped going "because everything was stable and all we did was talk." She was referred to nephrology in 07/2012 but did not follow-up.  She does not want to see nephrology again at this time but said she would be amenable next year.   Finally, pt reports some constipation with iron supplements.  She is taking OTC Senokot as needed for constipation and states that this helps.   Past Medical History  Diagnosis Date  . OBSTRUCTIVE SLEEP APNEA     CPAP  . PULMONARY EMBOLISM 09/24/2008    anticoag thru 03/2010  . Proteinuria   . BREAST CANCER, HX OF 1995    Right Breast  . ANEMIA-NOS   . DIABETES MELLITUS, TYPE II   . HYPERLIPIDEMIA   . HYPERTENSION   . CHRONIC KIDNEY DISEASE STAGE III (MODERATE)    Current Outpatient Prescriptions  Medication Sig Dispense Refill  . Alcohol  Swabs (ALCOHOL PREP) 70 % PADS Use as directed  200 each  5  . amLODipine (NORVASC) 10 MG tablet Take 1 tablet (10 mg total) by mouth daily.  90 tablet  3  . aspirin 325 MG tablet Take 325 mg by mouth daily.        . B-D UF III MINI PEN NEEDLES 31G X 5 MM MISC USE AS DIRECTED WITH LANTUS AND HUMALOG (5 INJECTIONS DAILY)  200 each  6  . diphenhydrAMINE (BENADRYL) 25 MG tablet Take 25 mg by mouth Nightly.        . ferrous sulfate 325 (65 FE) MG EC tablet Take 1 tablet (325 mg total) by mouth daily.  60 tablet  6  . furosemide (LASIX) 20 MG tablet Take 1 tablet (20 mg total) by mouth 2 (two) times daily.  90 tablet  4  . glucose blood (ONE TOUCH ULTRA TEST) test strip Use as instructed  300 each  3  .  insulin lispro (HUMALOG KWIKPEN) 100 UNIT/ML injection Inject 10 Units into the skin as needed. Three times per day with meals  30 mL  6  . lisinopril (PRINIVIL,ZESTRIL) 40 MG tablet Take 1 tablet (40 mg total) by mouth daily.  30 tablet  6  . ONETOUCH DELICA LANCETS MISC As instructed  300 each  1  . pravastatin (PRAVACHOL) 40 MG tablet Take 1 tablet (40 mg total) by mouth daily.  90 tablet  6  . sennosides-docusate sodium (SENOKOT-S) 8.6-50 MG tablet Take 2 tablets by mouth daily.      [name] albuterol (PROAIR HFA) 108 (90 BASE) MCG/ACT inhaler Inhale 1-2 puffs into the lungs every 4 (four) hours as needed for wheezing or shortness of breath.  3 Inhaler  2  . Armodafinil 150 MG tablet Take 1 tablet (150 mg total) by mouth daily.  30 tablet  5  . diclofenac sodium (VOLTAREN) 1 % GEL Apply 2 g topically 4 (four) times daily.  100 g  0  . insulin glargine (LANTUS SOLOSTAR) 100 UNIT/ML injection Inject 30 units into skin BID as directed  30 mL  6  . [DISCONTINUED] fluticasone (FLONASE) 50 MCG/ACT nasal spray Place 2 sprays into the nose daily.  16 g  2   No current facility-administered medications for this visit.   Family History  Problem Relation Age of Onset  . Diabetes Sister    History   Social History  . Marital Status: Divorced    Spouse Name: N/A    Number of Children: N/A  . Years of Education: N/A   Social History Main Topics  . Smoking status: Never Smoker   . Smokeless tobacco: None     Comment: Lives alone-divorced  . Alcohol Use: No  . Drug Use: No  . Sexual Activity: None   Other Topics Concern  . None   Social History Narrative  . None   Review of Systems: Review of Systems  Constitutional: Negative for fever, chills, malaise/fatigue and diaphoresis.  HENT: Negative for ear pain, nosebleeds, congestion, sore throat and neck pain.   Eyes: Negative for blurred vision.  Respiratory: Negative for cough and shortness of breath.   Cardiovascular: Negative for chest  pain, palpitations, claudication and leg swelling.  Gastrointestinal: Positive for constipation. Negative for nausea, vomiting, abdominal pain, diarrhea and blood in stool.  Genitourinary: Negative for dysuria.  Musculoskeletal: Positive for back pain. Negative for myalgias, joint pain and falls.  Skin: Negative for rash.  Neurological: Negative for dizziness, tingling,  focal weakness, loss of consciousness, weakness and headaches.  Endo/Heme/Allergies: Negative for polydipsia.    Objective:  Physical Exam: Filed Vitals:   01/08/13 0827  BP: 107/68  Pulse: 98  Temp: 97.9 F (36.6 C)  TempSrc: Oral  Weight: 236 lb 1.6 oz (107.094 kg)  SpO2: 98%   General: alert, cooperative, and in no apparent distress HEENT: vision grossly intact, oropharynx clear and non-erythematous  Neck: supple, no lymphadenopathy Lungs: clear to ascultation bilaterally, normal work of respiration, no wheezes, rales, ronchi Heart: regular rate and rhythm, no murmurs, gallops, or rubs Abdomen: soft, non-tender, non-distended, normal bowel sounds Extremities: trace pitting edema in bilateral lower extremities (pt states this is her baseline), no cyanosis, clubbing Neurologic: alert & oriented X3, cranial nerves II-XII intact, strength grossly intact, sensation intact to light touch  Assessment & Plan:  Patient discussed with Dr. Ellwood Dense.  Please see problem-based assessment and plan.

## 2013-01-08 NOTE — Patient Instructions (Addendum)
Please follow-up with Dr. Naaman Plummer in 3 months.   Remember to get a heating pad to use for your back pain as well as the pain relief gel we prescribed today.   For your CPAP mask, try to call [facility] and see if the tech can make adjustments to your mask so it is not so tight.   Keep up the great work on your blood pressure and diabetes!  Remember to bring your meter and medicines to your next visit.   We will talk about sending you back to the kidney doctor at your next appointment.  Diabetes Meal Planning Guide The diabetes meal planning guide is a tool to help you plan your meals and snacks. It is important for people with diabetes to manage their blood glucose (sugar) levels. Choosing the right foods and the right amounts throughout your day will help control your blood glucose. Eating right can even help you improve your blood pressure and reach or maintain a healthy weight. CARBOHYDRATE COUNTING MADE EASY When you eat carbohydrates, they turn to sugar. This raises your blood glucose level. Counting carbohydrates can help you control this level so you feel better. When you plan your meals by counting carbohydrates, you can have more flexibility in what you eat and balance your medicine with your food intake. Carbohydrate counting simply means adding up the total amount of carbohydrate grams in your meals and snacks. Try to eat about the same amount at each meal. Foods with carbohydrates are listed below. Each portion below is 1 carbohydrate serving or 15 grams of carbohydrates. Ask your dietician how many grams of carbohydrates you should eat at each meal or snack. Grains and Starches  1 slice bread.   English muffin or hotdog/hamburger bun.   cup cold cereal (unsweetened).   cup cooked pasta or rice.   cup starchy vegetables (corn, potatoes, peas, beans, winter squash).  1 tortilla (6 inches).   bagel.  1 waffle or pancake (size of a CD).   cup cooked cereal.  4 to 6 small  crackers. *Whole grain is recommended. Fruit  1 cup fresh unsweetened berries, melon, papaya, pineapple.  1 small fresh fruit.   banana or mango.   cup fruit juice (4 oz unsweetened).   cup canned fruit in natural juice or water.  2 tbs dried fruit.  12 to 15 grapes or cherries. Milk and Yogurt  1 cup fat-free or 1% milk.  1 cup soy milk.  6 oz light yogurt with sugar-free sweetener.  6 oz low-fat soy yogurt.  6 oz plain yogurt. Vegetables  1 cup raw or  cup cooked is counted as 0 carbohydrates or a "free" food.  If you eat 3 or more servings at 1 meal, count them as 1 carbohydrate serving. Other Carbohydrates   oz chips or pretzels.   cup ice cream or frozen yogurt.   cup sherbet or sorbet.  2 inch square cake, no frosting.  1 tbs honey, sugar, jam, jelly, or syrup.  2 small cookies.  3 squares of graham crackers.  3 cups popcorn.  6 crackers.  1 cup broth-based soup.  Count 1 cup casserole or other mixed foods as 2 carbohydrate servings.  Foods with less than 20 calories in a serving may be counted as 0 carbohydrates or a "free" food. You may want to purchase a book or computer software that lists the carbohydrate gram counts of different foods. In addition, the nutrition facts panel on the labels of the foods  you eat are a good source of this information. The label will tell you how big the serving size is and the total number of carbohydrate grams you will be eating per serving. Divide this number by 15 to obtain the number of carbohydrate servings in a portion. Remember, 1 carbohydrate serving equals 15 grams of carbohydrate. SERVING SIZES Measuring foods and serving sizes helps you make sure you are getting the right amount of food. The list below tells how big or small some common serving sizes are.  1 oz.........4 stacked dice.  3 oz........Marland KitchenDeck of cards.  1 tsp.......Marland KitchenTip of little finger.  1 tbs......Marland KitchenMarland KitchenThumb.  2 tbs.......Marland KitchenGolf  ball.   cup......Marland KitchenHalf of a fist.  1 cup.......Marland KitchenA fist. SAMPLE DIABETES MEAL PLAN Below is a sample meal plan that includes foods from the grain and starches, dairy, vegetable, fruit, and meat groups. A dietician can individualize a meal plan to fit your calorie needs and tell you the number of servings needed from each food group. However, controlling the total amount of carbohydrates in your meal or snack is more important than making sure you include all of the food groups at every meal. You may interchange carbohydrate containing foods (dairy, starches, and fruits). The meal plan below is an example of a 2000 calorie diet using carbohydrate counting. This meal plan has 17 carbohydrate servings. Breakfast  1 cup oatmeal (2 carb servings).   cup light yogurt (1 carb serving).  1 cup blueberries (1 carb serving).   cup almonds. Snack  1 large apple (2 carb servings).  1 low-fat string cheese stick. Lunch  Chicken breast salad.  1 cup spinach.   cup chopped tomatoes.  2 oz chicken breast, sliced.  2 tbs low-fat [place] dressing.  12 whole-wheat crackers (2 carb servings).  12 to 15 grapes (1 carb serving).  1 cup low-fat milk (1 carb serving). Snack  1 cup carrots.   cup hummus (1 carb serving). Dinner  3 oz broiled salmon.  1 cup brown rice (3 carb servings). Snack  1  cups steamed broccoli (1 carb serving) drizzled with 1 tsp olive oil and lemon juice.  1 cup light pudding (2 carb servings). DIABETES MEAL PLANNING WORKSHEET Your dietician can use this worksheet to help you decide how many servings of foods and what types of foods are right for you.  BREAKFAST Food Group and Servings / Carb Servings Grain/Starches __________________________________ Dairy __________________________________________ Vegetable ______________________________________ Fruit ___________________________________________ Meat __________________________________________ Fat  ____________________________________________ LUNCH Food Group and Servings / Carb Servings Grain/Starches ___________________________________ Dairy ___________________________________________ Fruit ____________________________________________ Meat ___________________________________________ Fat _____________________________________________ Wonda Cheng Food Group and Servings / Carb Servings Grain/Starches ___________________________________ Dairy ___________________________________________ Fruit ____________________________________________ Meat ___________________________________________ Fat _____________________________________________ SNACKS Food Group and Servings / Carb Servings Grain/Starches ___________________________________ Dairy ___________________________________________ Vegetable _______________________________________ Fruit ____________________________________________ Meat ___________________________________________ Fat _____________________________________________ DAILY TOTALS Starches _________________________ Vegetable ________________________ Fruit ____________________________ Dairy ____________________________ Meat ____________________________ Fat ______________________________ Document Released: 01/27/2005 Document Revised: 07/25/2011 Document Reviewed: 12/08/2008 [employer] Patient Information 2014 [employer].

## 2013-01-08 NOTE — Assessment & Plan Note (Signed)
Pt with central low back pain x 1 week without radiation to buttocks or legs. No history of heavy lifting, twisting, trauma, or falls.  Pt continues to walk daily and take her dog out. Pain is improved on intermittent Tylenol but not gone, no red flags.  Will avoid oral NSAIDs given CKD3.   -pt instructed to start using heating pad, Voltaren gel prn; continue Tylenol as needed -pt will let us know if back pain persists or worsens

## 2013-01-10 NOTE — Progress Notes (Signed)
I saw and evaluated the patient.  I personally confirmed the key portions of the history and exam documented by Dr. Rogers and I reviewed pertinent patient test results.  The assessment, diagnosis, and plan were formulated together and I agree with the documentation in the resident's note. 

## 2013-01-29 ENCOUNTER — Other Ambulatory Visit: Payer: Self-pay | Admitting: *Deleted

## 2013-01-29 DIAGNOSIS — I10 Essential (primary) hypertension: Secondary | ICD-10-CM

## 2013-01-29 MED ORDER — FUROSEMIDE 20 MG PO TABS: 20.0000 mg | ORAL_TABLET | Freq: Two times a day (BID) | ORAL | Status: DC

## 2013-01-30 NOTE — Telephone Encounter (Signed)
Called to [employer]

## 2013-02-04 ENCOUNTER — Emergency Department (HOSPITAL_COMMUNITY)
Admission: EM | Admit: 2013-02-04 | Discharge: 2013-02-04 | Disposition: A | Discharge status: Home / Self Care | Payer: No Typology Code available for payment source | Attending: Emergency Medicine | Admitting: Emergency Medicine

## 2013-02-04 ENCOUNTER — Encounter (HOSPITAL_COMMUNITY): Payer: Self-pay | Admitting: Emergency Medicine

## 2013-02-04 ENCOUNTER — Emergency Department (HOSPITAL_COMMUNITY): Payer: No Typology Code available for payment source

## 2013-02-04 DIAGNOSIS — M7989 Other specified soft tissue disorders: Secondary | ICD-10-CM

## 2013-02-04 DIAGNOSIS — R609 Edema, unspecified: Secondary | ICD-10-CM | POA: Insufficient documentation

## 2013-02-04 DIAGNOSIS — G4733 Obstructive sleep apnea (adult) (pediatric): Secondary | ICD-10-CM | POA: Insufficient documentation

## 2013-02-04 DIAGNOSIS — Z79899 Other long term (current) drug therapy: Secondary | ICD-10-CM | POA: Insufficient documentation

## 2013-02-04 DIAGNOSIS — Z794 Long term (current) use of insulin: Secondary | ICD-10-CM | POA: Insufficient documentation

## 2013-02-04 DIAGNOSIS — I809 Phlebitis and thrombophlebitis of unspecified site: Secondary | ICD-10-CM

## 2013-02-04 DIAGNOSIS — I8000 Phlebitis and thrombophlebitis of superficial vessels of unspecified lower extremity: Secondary | ICD-10-CM | POA: Insufficient documentation

## 2013-02-04 DIAGNOSIS — E785 Hyperlipidemia, unspecified: Secondary | ICD-10-CM | POA: Insufficient documentation

## 2013-02-04 DIAGNOSIS — Z86711 Personal history of pulmonary embolism: Secondary | ICD-10-CM | POA: Insufficient documentation

## 2013-02-04 DIAGNOSIS — M79662 Pain in left lower leg: Secondary | ICD-10-CM

## 2013-02-04 DIAGNOSIS — D649 Anemia, unspecified: Secondary | ICD-10-CM | POA: Insufficient documentation

## 2013-02-04 DIAGNOSIS — M79609 Pain in unspecified limb: Secondary | ICD-10-CM | POA: Insufficient documentation

## 2013-02-04 DIAGNOSIS — I129 Hypertensive chronic kidney disease with stage 1 through stage 4 chronic kidney disease, or unspecified chronic kidney disease: Secondary | ICD-10-CM | POA: Insufficient documentation

## 2013-02-04 DIAGNOSIS — E119 Type 2 diabetes mellitus without complications: Secondary | ICD-10-CM | POA: Insufficient documentation

## 2013-02-04 DIAGNOSIS — N289 Disorder of kidney and ureter, unspecified: Secondary | ICD-10-CM

## 2013-02-04 DIAGNOSIS — N183 Chronic kidney disease, stage 3 unspecified: Secondary | ICD-10-CM | POA: Insufficient documentation

## 2013-02-04 DIAGNOSIS — Z7982 Long term (current) use of aspirin: Secondary | ICD-10-CM | POA: Insufficient documentation

## 2013-02-04 LAB — BASIC METABOLIC PANEL
CO2: 22 mEq/L (ref 19–32)
Calcium: 9.8 mg/dL (ref 8.4–10.5)
Creatinine, Ser: 2.05 mg/dL — ABNORMAL HIGH (ref 0.50–1.10)

## 2013-02-04 LAB — CBC WITH DIFFERENTIAL/PLATELET
Basophils Absolute: 0 10*3/uL (ref 0.0–0.1)
Basophils Relative: 1 % (ref 0–1)
Eosinophils Absolute: 0.2 10*3/uL (ref 0.0–0.7)
Eosinophils Relative: 4 % (ref 0–5)
HCT: 33.2 % — ABNORMAL LOW (ref 36.0–46.0)
Lymphocytes Relative: 38 % (ref 12–46)
MCH: 30.1 pg (ref 26.0–34.0)
MCHC: 33.4 g/dL (ref 30.0–36.0)
MCV: 90 fL (ref 78.0–100.0)
Monocytes Absolute: 0.6 10*3/uL (ref 0.1–1.0)
RDW: 12.7 % (ref 11.5–15.5)

## 2013-02-04 MED ORDER — ENOXAPARIN SODIUM 120 MG/0.8ML ~~LOC~~ SOLN
1.0000 mg/kg | Freq: Once | SUBCUTANEOUS | Status: AC
  Administered 2013-02-04: 110 mg via SUBCUTANEOUS
  Filled 2013-02-04: qty 0.8

## 2013-02-04 NOTE — ED Provider Notes (Signed)
Pt signed out by Dr Roxanne Mins to follow up on u/s result, d/c home.  Vascular doppler c/w superficial thrombophlebitis. Pt states current takes asa q day, will continue. As chronic renal insuff, will not add any additional nsaid tx.  Warm compresses.   Close pcp follow up.   Author: Dareen Piano Service: Vascular Lab Author Type: Cardiovascular Sonographer   Filed: 02/04/2013 9:13 AM Note Time: 02/04/2013 9:10 AM         VASCULAR LAB  PRELIMINARY PRELIMINARY PRELIMINARY PRELIMINARY  Left lower extremity venous duplex completed.  Preliminary report: No evdicence of DVT or Baker's cyst. Positive for a superficial thrombus of the greater saphenous vein coursing from approximately 3 inches proximal to the ankle to 3 inches distal to the knee  SLAUGHTER, [place], RVS  02/04/2013, 9:10 AM     Mirna Mires, MD 02/04/13 [phone]

## 2013-02-04 NOTE — ED Notes (Signed)
Pt states this morning she began having pain in left lower leg with swelling. Pt states she has a hx of pulmonary embolism.

## 2013-02-04 NOTE — ED Provider Notes (Signed)
CSN: [id number]     Arrival date & time 02/04/13  0455 History   First MD Initiated Contact with Patient 02/04/13 0507     Chief Complaint  Patient presents with  . Leg Swelling   (Consider location/radiation/quality/duration/timing/severity/associated sxs/prior Treatment) The history is provided by the patient.   a 62 year old female woke up this morning and noted pain in her left calf and noted a knot in her left calf. Pain is only present when she stands and she rates it at 5/10. When she is laying flat, pain is 0/10 she has chronic leg swelling which has gotten worse recently. She denies chest pain, dyspnea, fever. She does have a history of pulmonary embolism and had been on warfarin for 18 months. She has been off warfarin for almost 3 years. She denies recent travel, surgery, current at cancer (although she has a history of breast cancer in the past).  Past Medical History  Diagnosis Date  . OBSTRUCTIVE SLEEP APNEA     CPAP  . PULMONARY EMBOLISM 09/24/2008    anticoag thru 03/2010  . Proteinuria   . BREAST CANCER, HX OF 1995    Right Breast  . ANEMIA-NOS   . DIABETES MELLITUS, TYPE II   . HYPERLIPIDEMIA   . HYPERTENSION   . CHRONIC KIDNEY DISEASE STAGE III (MODERATE)    Past Surgical History  Procedure Laterality Date  . Mastectomy  1995    Right  . Insertion port a cath    . Port-a-cath removal     Family History  Problem Relation Age of Onset  . Diabetes Sister    History  Substance Use Topics  . Smoking status: Never Smoker   . Smokeless tobacco: Not on file     Comment: Lives alone-divorced  . Alcohol Use: No   OB History   Grav Para Term Preterm Abortions TAB SAB Ect Mult Living                 Review of Systems  All other systems reviewed and are negative.    Allergies  Review of patient's allergies indicates no known allergies.  Home Medications   Current Outpatient Rx  Name  Route  Sig  Dispense  Refill  . albuterol (PROAIR HFA) 108 (90 BASE)  MCG/ACT inhaler   Inhalation   Inhale 1-2 puffs into the lungs every 4 (four) hours as needed for wheezing or shortness of breath.   3 Inhaler   2   . Alcohol Swabs (ALCOHOL PREP) 70 % PADS      Use as directed   200 each   5   . amLODipine (NORVASC) 10 MG tablet   Oral   Take 1 tablet (10 mg total) by mouth daily.   90 tablet   3   . EXPIRED: Armodafinil 150 MG tablet   Oral   Take 1 tablet (150 mg total) by mouth daily.   30 tablet   5   . aspirin 325 MG tablet   Oral   Take 325 mg by mouth daily.           . B-D UF III MINI PEN NEEDLES 31G X 5 MM MISC      USE AS DIRECTED WITH LANTUS AND HUMALOG (5 INJECTIONS DAILY)   200 each   6   . diclofenac sodium (VOLTAREN) 1 % GEL   Topical   Apply 2 g topically 4 (four) times daily.   100 g   0   .  diphenhydrAMINE (BENADRYL) 25 MG tablet   Oral   Take 25 mg by mouth Nightly.           . ferrous sulfate 325 (65 FE) MG EC tablet   Oral   Take 1 tablet (325 mg total) by mouth daily.   60 tablet   6   . furosemide (LASIX) 20 MG tablet   Oral   Take 1 tablet (20 mg total) by mouth 2 (two) times daily.   180 tablet   1   . glucose blood (ONE TOUCH ULTRA TEST) test strip      Use as instructed   300 each   3   . insulin glargine (LANTUS SOLOSTAR) 100 UNIT/ML injection      Inject 30 units into skin BID as directed   30 mL   6   . insulin lispro (HUMALOG KWIKPEN) 100 UNIT/ML injection   Subcutaneous   Inject 10 Units into the skin as needed. Three times per day with meals   30 mL   6   . lisinopril (PRINIVIL,ZESTRIL) 40 MG tablet   Oral   Take 1 tablet (40 mg total) by mouth daily.   30 tablet   6   . ONETOUCH DELICA LANCETS MISC      As instructed   300 each   1   . pravastatin (PRAVACHOL) 40 MG tablet   Oral   Take 1 tablet (40 mg total) by mouth daily.   90 tablet   6   . sennosides-docusate sodium (SENOKOT-S) 8.6-50 MG tablet   Oral   Take 2 tablets by mouth daily.           BP 135/74  Pulse 77  Temp(Src) 98.4 F (36.9 C) (Oral)  Resp 16  SpO2 100% Physical Exam  Nursing note and vitals reviewed.  62 year old female, resting comfortably and in no acute distress. Vital signs are normal. Oxygen saturation is 100%, which is normal. Head is normocephalic and atraumatic. PERRLA, EOMI. Oropharynx is clear. Neck is nontender and supple without adenopathy or JVD. Back is nontender and there is no CVA tenderness. Lungs are clear without rales, wheezes, or rhonchi. Chest is nontender. Heart has regular rate and rhythm without murmur. Abdomen is soft, flat, nontender without masses or hepatosplenomegaly and peristalsis is normoactive. Extremities have 2+ edema, full range of motion is present. Left head circumference is 3 cm greater than right calf circumference. There is a cord palpable in the medial aspect of the left midcalf. Homans sign is negative. There's no erythema or warmth. Skin is warm and dry without rash. Neurologic: Mental status is normal, cranial nerves are intact, there are no motor or sensory deficits.  ED Course  Procedures (including critical care time) Labs Review Results for orders placed during the hospital encounter of 02/04/13  CBC WITH DIFFERENTIAL      Result Value Range   WBC 5.7  4.0 - 10.5 K/uL   RBC 3.69 (*) 3.87 - 5.11 MIL/uL   Hemoglobin 11.1 (*) 12.0 - 15.0 g/dL   HCT 33.2 (*) 36.0 - 46.0 %   MCV 90.0  78.0 - 100.0 fL   MCH 30.1  26.0 - 34.0 pg   MCHC 33.4  30.0 - 36.0 g/dL   RDW 12.7  11.5 - 15.5 %   Platelets 218  150 - 400 K/uL   Neutrophils Relative % 48  43 - 77 %   Neutro Abs 2.7  1.7 - 7.7 K/uL  Lymphocytes Relative 38  12 - 46 %   Lymphs Abs 2.1  0.7 - 4.0 K/uL   Monocytes Relative 10  3 - 12 %   Monocytes Absolute 0.6  0.1 - 1.0 K/uL   Eosinophils Relative 4  0 - 5 %   Eosinophils Absolute 0.2  0.0 - 0.7 K/uL   Basophils Relative 1  0 - 1 %   Basophils Absolute 0.0  0.0 - 0.1 K/uL  BASIC METABOLIC PANEL       Result Value Range   Sodium 138  135 - 145 mEq/L   Potassium 3.9  3.5 - 5.1 mEq/L   Chloride 104  96 - 112 mEq/L   CO2 22  19 - 32 mEq/L   Glucose, Bld 114 (*) 70 - 99 mg/dL   BUN 30 (*) 6 - 23 mg/dL   Creatinine, Ser 2.05 (*) 0.50 - 1.10 mg/dL   Calcium 9.8  8.4 - 10.5 mg/dL   GFR calc non Af Amer 25 (*) >90 mL/min   GFR calc Af Amer 29 (*) >90 mL/min   Imaging Review Dg Chest 2 View  02/04/2013   CLINICAL DATA:  Leg swelling and shortness of breath.  EXAM: CHEST  2 VIEW  COMPARISON:  09/23/2008  FINDINGS: Chronic cardiomegaly. There bronchitic changes, best in the lateral projection, without overt edema. No effusion or pneumothorax. Aortic atherosclerosis. Right axillary dissection.  IMPRESSION: Cardiomegaly and pulmonary venous congestion.   Electronically Signed   By: Jorje Guild   On: 02/04/2013 06:10    MDM   1. Peripheral edema   2. Pain of left calf   3. Renal insufficiency    Asymmetric leg swelling with pain worrisome for recurrent DVT. She's given an injection of Lovenox and will be sent for a venous duplex scan. Old records are reviewed and she was treated with warfarin for pulmonary embolism from May 2003 and November 2011. Of note, she has chronic renal insufficiency. Leg swelling has gotten worse recently and she may need to be on a higher dose of her diuretic.  Renal function is at baseline and chest x-ray is unchanged. Venous Doppler is pending. Case is endorsed to Dr.Steinl to evaluate results.   Delora Fuel, MD [id number] [phone]

## 2013-02-04 NOTE — Addendum Note (Signed)
Addended by: Truddie Crumble on: 02/04/2013 09:40 AM   Modules accepted: Orders

## 2013-02-04 NOTE — Progress Notes (Signed)
VASCULAR LAB PRELIMINARY  PRELIMINARY  PRELIMINARY  PRELIMINARY  Left lower extremity venous duplex completed.    Preliminary report:  No evdicence of DVT or Baker's cyst. Positive for a superficial thrombus of the greater saphenous vein coursing from approximately 3 inches proximal to the ankle to 3 inches distal to the knee  Fatina Sprankle, RVS 02/04/2013, 9:10 AM

## 2013-04-04 ENCOUNTER — Encounter: Payer: Self-pay | Admitting: Internal Medicine

## 2013-04-04 ENCOUNTER — Ambulatory Visit (INDEPENDENT_AMBULATORY_CARE_PROVIDER_SITE_OTHER): Payer: No Typology Code available for payment source | Admitting: Internal Medicine

## 2013-04-04 ENCOUNTER — Telehealth: Payer: Self-pay | Admitting: *Deleted

## 2013-04-04 ENCOUNTER — Telehealth: Payer: Self-pay | Admitting: Dietician

## 2013-04-04 VITALS — BP 113/60 | HR 80 | Temp 98.0°F | Wt 237.4 lb

## 2013-04-04 DIAGNOSIS — E785 Hyperlipidemia, unspecified: Secondary | ICD-10-CM

## 2013-04-04 DIAGNOSIS — E119 Type 2 diabetes mellitus without complications: Secondary | ICD-10-CM

## 2013-04-04 DIAGNOSIS — K089 Disorder of teeth and supporting structures, unspecified: Secondary | ICD-10-CM

## 2013-04-04 DIAGNOSIS — D649 Anemia, unspecified: Secondary | ICD-10-CM

## 2013-04-04 DIAGNOSIS — Z23 Encounter for immunization: Secondary | ICD-10-CM

## 2013-04-04 DIAGNOSIS — K0889 Other specified disorders of teeth and supporting structures: Secondary | ICD-10-CM

## 2013-04-04 DIAGNOSIS — G4733 Obstructive sleep apnea (adult) (pediatric): Secondary | ICD-10-CM

## 2013-04-04 DIAGNOSIS — Z0000 Encounter for general adult medical examination without abnormal findings: Secondary | ICD-10-CM

## 2013-04-04 DIAGNOSIS — I10 Essential (primary) hypertension: Secondary | ICD-10-CM

## 2013-04-04 MED ORDER — LISINOPRIL 40 MG PO TABS: 40.0000 mg | ORAL_TABLET | Freq: Every day | ORAL | Status: DC

## 2013-04-04 MED ORDER — ROSUVASTATIN CALCIUM 10 MG PO TABS: 10.0000 mg | ORAL_TABLET | Freq: Every day | ORAL | Status: DC

## 2013-04-04 MED ORDER — INSULIN GLARGINE 100 UNIT/ML ~~LOC~~ SOLN: SUBCUTANEOUS | Status: DC

## 2013-04-04 MED ORDER — AMLODIPINE BESYLATE 10 MG PO TABS: 10.0000 mg | ORAL_TABLET | Freq: Every day | ORAL | Status: DC

## 2013-04-04 MED ORDER — INSULIN LISPRO 100 UNIT/ML ~~LOC~~ SOLN: 10.0000 [IU] | SUBCUTANEOUS | Status: DC | PRN

## 2013-04-04 MED ORDER — FUROSEMIDE 20 MG PO TABS: 20.0000 mg | ORAL_TABLET | Freq: Two times a day (BID) | ORAL | Status: DC

## 2013-04-04 NOTE — Progress Notes (Signed)
Patient ID: Patricia Morgan, female   DOB: 1951/01/16, 62 y.o.   MRN: [id number]   Subjective:   Patient ID: Katharine Galm female   DOB: 06-Dec-1950 62 y.o.   MRN: [id number]  HPI: Ms.Buna Hanaway is a 62 y.o. with insulin-dependent Type 2 DM, HTN, HL, CKD due IgA nephropathy, and OSA who presents for routine check-up visit and refill of medications.    Pt with last A1c of 6.1 on 01/08/13 who is on 30 U Lantus BID and Novolog 10 TID which she is compliant with. Pt reports checking her glucose x3 daily which has been ranging from 94-126. She forgot to bring her glucose meter in today. She denies hypoglycemic episodes. She has at baseline polyuria (on chronic diuretic) and polydipsia but denies polyphagia. She is working on limiting her carbohydrate intake and exercising. Her weight has been stable. She recently had annual dilated retinal exam that was normal and denies blurry vision. No history of neuropathy.  No foot infections or injuries. She has CKD due to IgA nephropathy. No history of MI or CVA.   She monitors her blood pressure at home and reports it has been in the normal range. She in on amlodipine, lisinopril, and furosemide. Denies HA, lightheadedness, palpitations, and CP.    She reports pain in her left upper molar and left lower molar that is worse when she chews and is sensitive to cold. Pt has been using tylenol for relief.  Pt has had prior tooth extractions.   In September of this year, she was diagnosed with superficial thrombus of the greater saphenous vein and was placed on 325 mg aspirin daily which she reports being compliant with. She denies LE swelling and pain. She has been compliant with furosemide daily.   She has been on chronic iron supplements for very long time, at time when she was menstruating.  She denies Reports she uses stool softener due to constipation.   She has OSA and is tolerating CPAP well at night.            Past Medical History  Diagnosis Date    . OBSTRUCTIVE SLEEP APNEA     CPAP  . PULMONARY EMBOLISM 09/24/2008    anticoag thru 03/2010  . Proteinuria   . BREAST CANCER, HX OF 1995    Right Breast  . ANEMIA-NOS   . DIABETES MELLITUS, TYPE II   . HYPERLIPIDEMIA   . HYPERTENSION   . CHRONIC KIDNEY DISEASE STAGE III (MODERATE)    Current Outpatient Prescriptions  Medication Sig Dispense Refill  . albuterol (PROAIR HFA) 108 (90 BASE) MCG/ACT inhaler Inhale 1-2 puffs into the lungs every 4 (four) hours as needed for wheezing or shortness of breath.  3 Inhaler  2  . amLODipine (NORVASC) 10 MG tablet Take 1 tablet (10 mg total) by mouth daily.  90 tablet  3  . aspirin 325 MG tablet Take 325 mg by mouth daily.        . diphenhydrAMINE (BENADRYL) 25 MG tablet Take 25 mg by mouth Nightly.        . furosemide (LASIX) 20 MG tablet Take 1 tablet (20 mg total) by mouth 2 (two) times daily.  180 tablet  1  . insulin glargine (LANTUS) 100 UNIT/ML injection Inject 30 units into skin BID as directed  30 mL  6  . insulin lispro (HUMALOG) 100 UNIT/ML injection Inject 10 Units into the skin as needed. Three times per day with meals  30 mL  6  .  lisinopril (PRINIVIL,ZESTRIL) 40 MG tablet Take 1 tablet (40 mg total) by mouth daily.  90 tablet  1  . rosuvastatin (CRESTOR) 10 MG tablet Take 1 tablet (10 mg total) by mouth at bedtime.  90 tablet  1  . [DISCONTINUED] fluticasone (FLONASE) 50 MCG/ACT nasal spray Place 2 sprays into the nose daily.  16 g  2   No current facility-administered medications for this visit.   Family History  Problem Relation Age of Onset  . Diabetes Sister    History   Social History  . Marital Status: Divorced    Spouse Name: N/A    Number of Children: N/A  . Years of Education: 35   Social History Main Topics  . Smoking status: Never Smoker   . Smokeless tobacco: None     Comment: Lives alone-divorced  . Alcohol Use: No  . Drug Use: No  . Sexual Activity: None   Other Topics Concern  . None   Social  History Narrative  . None   Review of Systems: Review of Systems  Constitutional: Negative for fever, chills, weight loss, malaise/fatigue and diaphoresis.  HENT: Negative for congestion and sore throat.   Eyes: Negative for blurred vision.  Respiratory: Negative for cough, shortness of breath and wheezing.   Cardiovascular: Negative for chest pain and palpitations.  Gastrointestinal: Positive for constipation. Negative for nausea, vomiting, abdominal pain, diarrhea, blood in stool and melena.  Genitourinary: Negative for dysuria, urgency, frequency and hematuria.  Musculoskeletal: Negative for back pain, joint pain and myalgias.  Neurological: Negative for dizziness, tingling, sensory change, weakness and headaches.  Endo/Heme/Allergies: Positive for polydipsia (chronic).  Psychiatric/Behavioral: The patient does not have insomnia.      Objective:  Physical Exam: Filed Vitals:   04/04/13 1522  BP: 113/60  Pulse: 80  Temp: 98 F (36.7 C)  TempSrc: Oral  Weight: 237 lb 6.4 oz (107.684 kg)  SpO2: 98%   Physical Exam  Constitutional: She is oriented to person, place, and time. She appears well-developed and well-nourished. No distress.  obese  HENT:  Head: Normocephalic and atraumatic.  Mouth/Throat: Oropharynx is clear and moist. No oropharyngeal exudate.  Poor dentition with multiple extracted teeth, no evidence of necrosis or abscess.   Eyes: EOM are normal.  Neck: Normal range of motion. Neck supple.  Cardiovascular: Normal rate, regular rhythm and normal heart sounds.   No murmur heard. Pulmonary/Chest: Effort normal and breath sounds normal. No respiratory distress. She has no wheezes. She has no rales.  Abdominal: Soft. Bowel sounds are normal. She exhibits no distension. There is no tenderness. There is no rebound and no guarding.  Musculoskeletal: Normal range of motion. She exhibits no edema and no tenderness.  Neurological: She is alert and oriented to person,  place, and time. No cranial nerve deficit.  Skin: Skin is warm and dry. No rash noted. She is not diaphoretic. No erythema. No pallor.  Psychiatric: She has a normal mood and affect. Her behavior is normal.    Assessment & Plan:  Please see problem list for problem-based assessment and plan

## 2013-04-04 NOTE — Assessment & Plan Note (Addendum)
Assessment: Pt with uncomplicated insulin-dependent T2DM with last A1c of 6.1 on 01/08/13 who is well-controlled on 30 U Lantus BID and Novolog 10 TID which she is compliant with. Pt reports checking her glucose x3 daily which has been ranging from 94-126 with no hypoglycemic episodes. She has at baseline polyuria (on chronic diuretic) and polydipsia but denies polyphagia. Last annual dilated retinal exam on 8/20 was normal. No history of neuropathy. No foot infections or injuries. She has CKD due to IgA nephropathy. No history of MI or CVA.   Plan: -Continue Lantus 30 U BID -Continue Novolog 10 U TID -Encourage weight loss (BMI currently 44.2)

## 2013-04-04 NOTE — Telephone Encounter (Signed)
Working on [employer] where her medicines are affordable and Needs generic insulin for Lantus and Humalog. Met with patient at her office appointment today and gave her my contact information and information about insulin products and substitutions.

## 2013-04-04 NOTE — Assessment & Plan Note (Addendum)
Patient received influenza annual vaccine on 04/04/13.   Referral to dentistry for left molar tooth pain.

## 2013-04-04 NOTE — Assessment & Plan Note (Addendum)
Assessment: Pt with CKD due to IgA nephropathy per biopsy in 2011. Last seen by nephrology on 01/23/12 and GFR was 40. Last GFR 29 on 02/04/13, indicating CKD stage IV.    Last Cr 2.04 on 02/04/13.   Plan:  -Per nephrology  - maintain excellent control of BP, glucose, and cholesterol -Not currently following with nephrology -BMP at next visit

## 2013-04-04 NOTE — Assessment & Plan Note (Addendum)
Assessment: Pt with well-controlled BP on three antihypertensives at goal of <130/80.   Plan:   -Continue amlodipine 10 mg daily  -Continue lisinopril 40 mg -Continue furosemide 20 mg BID -Encouraged continuing blood pressure monitoring at home

## 2013-04-04 NOTE — Patient Instructions (Addendum)
-  Please stop taking iron supplements  -I have refilled your prescriptions and changed pravastatin to Crestor -Will give you flu shot today -Will refer you to dentistry  -Nice meeting you !

## 2013-04-04 NOTE — Assessment & Plan Note (Addendum)
Assessment: Pt with elevated LDL of 111 on lipid panel on 09/13/12 not at goal of <100.   Plan:  -Switch pravastatin 40 mg daily to 10 mg Crestor (can obtain at no charge).

## 2013-04-04 NOTE — Telephone Encounter (Signed)
[facility] can get Crestor 10 mg at no charge for pt, would you consider changing from Pravastatin?

## 2013-04-04 NOTE — Assessment & Plan Note (Signed)
Assessment: Pt with OSA who is tolerating CPAP well at night. No reports of daytime sleepiness or fatigue.    Plan: -Continue CPAP at night

## 2013-04-04 NOTE — Assessment & Plan Note (Addendum)
Assessment: Pt with normocytic anemia. She reports being on oral iron supplementation for years, started during premenopausal years. Pt with last anemia panel on 07/16/12 with no evidence of iron-deficiency anemia.  She reports constipation from iron pills. No reports of GI or GU bleeding. Normocytic anemia most likely from CKD, however per nephrology note on  01/23/12 no evidence of anemia from CKD.   Plan:  -Discontinue oral iron supplementation -Obtain anemia panel at next visit

## 2013-04-08 NOTE — Telephone Encounter (Signed)
Rx filled and faxed

## 2013-04-10 NOTE — Progress Notes (Signed)
I saw and evaluated the patient.  I personally confirmed the key portions of the history and exam documented by Dr. Naaman Plummer and I reviewed pertinent patient test results.  The assessment, diagnosis, and plan were formulated together and I agree with the documentation in the resident's note. Patricia Morgan used to work at [employer] in [place] [place] but lost her job when her dept was downsized. No insurance. Was told that she qualified for a policy under the [place] but she states that she cannot afford the plan that she qualified for under the [place]. Since she did qualify, she will lose her orange card. We discussed how important it is for her to cont to mange her health issues.

## 2013-06-13 ENCOUNTER — Telehealth: Payer: Self-pay | Admitting: *Deleted

## 2013-06-13 NOTE — Addendum Note (Signed)
Addended by: Hulan Fray on: 06/13/2013 04:58 PM   Modules accepted: Orders

## 2013-06-13 NOTE — Telephone Encounter (Signed)
LVM FOR MS LINDA Kegley TO CALL OFFICE. NEED TO KNOW IF SHE EVER CAME TO MEET WITH THE NAVIGATOR HERE IN THE CLINIC.  PATIENT HAS A DENTAL REFERRAL IN ALREADY

## 2013-08-02 ENCOUNTER — Other Ambulatory Visit: Payer: Self-pay | Admitting: *Deleted

## 2013-08-02 MED ORDER — ALBUTEROL SULFATE HFA 108 (90 BASE) MCG/ACT IN AERS: 1.0000 | INHALATION_SPRAY | Freq: Four times a day (QID) | RESPIRATORY_TRACT | Status: DC | PRN

## 2013-08-05 NOTE — Telephone Encounter (Signed)
Rx faxed in.

## 2013-08-13 ENCOUNTER — Encounter (HOSPITAL_COMMUNITY): Payer: Self-pay | Admitting: Emergency Medicine

## 2013-08-13 ENCOUNTER — Emergency Department (HOSPITAL_COMMUNITY)
Admission: EM | Admit: 2013-08-13 | Discharge: 2013-08-13 | Disposition: A | Discharge status: Home / Self Care | Payer: Self-pay | Attending: Emergency Medicine | Admitting: Emergency Medicine

## 2013-08-13 ENCOUNTER — Emergency Department (HOSPITAL_COMMUNITY): Payer: Self-pay

## 2013-08-13 DIAGNOSIS — Z7982 Long term (current) use of aspirin: Secondary | ICD-10-CM | POA: Insufficient documentation

## 2013-08-13 DIAGNOSIS — M25569 Pain in unspecified knee: Secondary | ICD-10-CM | POA: Insufficient documentation

## 2013-08-13 DIAGNOSIS — N183 Chronic kidney disease, stage 3 unspecified: Secondary | ICD-10-CM | POA: Insufficient documentation

## 2013-08-13 DIAGNOSIS — Z862 Personal history of diseases of the blood and blood-forming organs and certain disorders involving the immune mechanism: Secondary | ICD-10-CM | POA: Insufficient documentation

## 2013-08-13 DIAGNOSIS — I129 Hypertensive chronic kidney disease with stage 1 through stage 4 chronic kidney disease, or unspecified chronic kidney disease: Secondary | ICD-10-CM | POA: Insufficient documentation

## 2013-08-13 DIAGNOSIS — Z853 Personal history of malignant neoplasm of breast: Secondary | ICD-10-CM | POA: Insufficient documentation

## 2013-08-13 DIAGNOSIS — Z9981 Dependence on supplemental oxygen: Secondary | ICD-10-CM | POA: Insufficient documentation

## 2013-08-13 DIAGNOSIS — G4733 Obstructive sleep apnea (adult) (pediatric): Secondary | ICD-10-CM | POA: Insufficient documentation

## 2013-08-13 DIAGNOSIS — G8921 Chronic pain due to trauma: Secondary | ICD-10-CM | POA: Insufficient documentation

## 2013-08-13 DIAGNOSIS — E785 Hyperlipidemia, unspecified: Secondary | ICD-10-CM | POA: Insufficient documentation

## 2013-08-13 DIAGNOSIS — Z794 Long term (current) use of insulin: Secondary | ICD-10-CM | POA: Insufficient documentation

## 2013-08-13 DIAGNOSIS — Z86711 Personal history of pulmonary embolism: Secondary | ICD-10-CM | POA: Insufficient documentation

## 2013-08-13 DIAGNOSIS — Z79899 Other long term (current) drug therapy: Secondary | ICD-10-CM | POA: Insufficient documentation

## 2013-08-13 MED ORDER — MELOXICAM 7.5 MG PO TABS: 7.5000 mg | ORAL_TABLET | Freq: Every day | ORAL | Status: DC

## 2013-08-13 MED ORDER — TRAMADOL HCL 50 MG PO TABS: 50.0000 mg | ORAL_TABLET | Freq: Four times a day (QID) | ORAL | Status: DC | PRN

## 2013-08-13 NOTE — ED Provider Notes (Signed)
CSN: [id number]     Arrival date & time 08/13/13  1605 History   This chart was scribed for non-physician practitioner Carlisle Cater, PA-C, working with Richarda Blade, MD, by Neta Ehlers, ED Scribe. This patient was seen in room WTR8/WTR8 and the patient's care was started at 5:54 PM. First MD Initiated Contact with Patient 08/13/13 1708     Chief Complaint  Patient presents with  . Knee Pain    The history is provided by the patient. No language interpreter was used.   HPI Comments: Patricia Morgan is a 63 y.o. female, with a h/o DM, who presents to the Emergency Department complaining of gradually-improving pain to her right knee which has persisted for a month. She injured it while riding upon a stationary bike; she reports she felt pain immediately upon getting off the exercise bike. She reports with the initial injury, the knee was swollen and she had pain to both the posterior and anterior knee; she reports the anterior has improved but the posterior pain has persisted. She is ambulatory, but with difficulty. She has used a heating pad, elevation, warm baths and a knee brace to treat the pain. She has also used Tylenol for the pain. She takes Aspirin 325 mg; she denies coumadin. She denies numbness/paresthesia or hip pain.  Onset is chronic. Course is improving. Aggravating factors are ambulation. Alleviating factors are none.    Past Medical History  Diagnosis Date  . OBSTRUCTIVE SLEEP APNEA     CPAP  . PULMONARY EMBOLISM 09/24/2008    anticoag thru 03/2010  . Proteinuria   . BREAST CANCER, HX OF 1995    Right Breast  . ANEMIA-NOS   . DIABETES MELLITUS, TYPE II   . HYPERLIPIDEMIA   . HYPERTENSION   . CHRONIC KIDNEY DISEASE STAGE III (MODERATE)    Past Surgical History  Procedure Laterality Date  . Mastectomy  1995    Right  . Insertion port a cath    . Port-a-cath removal     Family History  Problem Relation Age of Onset  . Diabetes Sister    History  Substance Use  Topics  . Smoking status: Never Smoker   . Smokeless tobacco: Not on file     Comment: Lives alone-divorced  . Alcohol Use: No   No OB history provided.  Review of Systems  Constitutional: Negative for activity change.  Musculoskeletal: Positive for arthralgias (Right knee pain) and gait problem. Negative for back pain, joint swelling and neck pain.  Skin: Negative for wound.  Neurological: Negative for weakness and numbness.    Allergies  Review of patient's allergies indicates no known allergies.  Home Medications   Current Outpatient Rx  Name  Route  Sig  Dispense  Refill  . albuterol (PROAIR HFA) 108 (90 BASE) MCG/ACT inhaler   Inhalation   Inhale 1-2 puffs into the lungs every 6 (six) hours as needed for wheezing or shortness of breath.   1 Inhaler   2   . amLODipine (NORVASC) 10 MG tablet   Oral   Take 1 tablet (10 mg total) by mouth daily.   90 tablet   3   . aspirin 325 MG tablet   Oral   Take 325 mg by mouth daily.           . diphenhydrAMINE (BENADRYL) 25 MG tablet   Oral   Take 25 mg by mouth Nightly.           . furosemide (LASIX)  20 MG tablet   Oral   Take 1 tablet (20 mg total) by mouth 2 (two) times daily.   180 tablet   1   . insulin glargine (LANTUS) 100 UNIT/ML injection      Inject 30 units into skin BID as directed   30 mL   6   . insulin lispro (HUMALOG) 100 UNIT/ML injection   Subcutaneous   Inject 10 Units into the skin as needed. Three times per day with meals   30 mL   6   . lisinopril (PRINIVIL,ZESTRIL) 40 MG tablet   Oral   Take 1 tablet (40 mg total) by mouth daily.   90 tablet   1   . rosuvastatin (CRESTOR) 10 MG tablet   Oral   Take 1 tablet (10 mg total) by mouth at bedtime.   90 tablet   1    Triage Vitals: BP 128/55  Pulse 80  Temp(Src) 98.1 F (36.7 C) (Oral)  Resp 16  SpO2 100%  Physical Exam  Nursing note and vitals reviewed. Constitutional: She appears well-developed and well-nourished. No  distress.  HENT:  Head: Normocephalic and atraumatic.  Eyes: EOM are normal. Pupils are equal, round, and reactive to light.  Neck: Normal range of motion. Neck supple. No tracheal deviation present.  Cardiovascular: Normal rate.  Exam reveals no decreased pulses.   Pulses:      Dorsalis pedis pulses are 2+ on the right side, and 2+ on the left side.       Posterior tibial pulses are 2+ on the right side, and 2+ on the left side.  Pulmonary/Chest: Effort normal. No respiratory distress.  Musculoskeletal: Normal range of motion. She exhibits tenderness. She exhibits no edema.       Right hip: Normal.       Right knee: She exhibits normal range of motion, no swelling and no effusion. Tenderness found. Lateral joint line tenderness noted.       Right ankle: Normal.  Neurological: She is alert. No sensory deficit.  Motor, sensation, and vascular distal to the injury is fully intact.   Skin: Skin is warm and dry.  Psychiatric: She has a normal mood and affect. Her behavior is normal.    ED Course  Procedures (including critical care time)  DIAGNOSTIC STUDIES: Oxygen Saturation is 100% on room air, normal by my interpretation.    COORDINATION OF CARE:  5:58 PM- Discussed treatment plan with patient, and the patient agreed to the plan. The plan includes NSAIDs and pain medication.   Labs Review Labs Reviewed - No data to display Imaging Review Dg Knee Complete 4 Views Right  08/13/2013   CLINICAL DATA:  Right knee injury, posterior pain  EXAM: RIGHT KNEE - COMPLETE 4+ VIEW  COMPARISON:  None.  FINDINGS: Normal alignment without fracture or effusion. Preserved joint spaces. No significant arthropathy. Obese body habitus.  IMPRESSION: No acute osseous finding   Electronically Signed   By: Daryll Brod M.D.   On: 08/13/2013 17:31     EKG Interpretation None      Patient seen and examined.   Vital signs reviewed and are as follows: Filed Vitals:   08/13/13 1818  BP:   Pulse: 79   Temp:   Resp:   BP 128/55  Pulse 79  Temp(Src) 98.1 F (36.7 C) (Oral)  Resp 16  SpO2 99%  Patient informed of x-ray results.  Patient counseled on use of narcotic pain medications. Counseled not to combine these  medications with others containing tylenol. Urged not to drink alcohol, drive, or perform any other activities that requires focus while taking these medications. The patient verbalizes understanding and agrees with the plan.  Patient was counseled on RICE protocol and told to rest injury, use ice for no longer than 15 minutes every hour, compress the area, and elevate above the level of their heart as much as possible to reduce swelling. Questions answered. Patient verbalized understanding.    MDM   Final diagnoses:  Knee pain   Patient with knee pain after injury one month. Pain is gradually improving. Given that patient is only on aspirin, feel that short course of NSAIDs is reasonable. Leg is neurovascularly intact. No concern for septic arthritis. Compartment soft.  I personally performed the services described in this documentation, which was scribed in my presence. The recorded information has been reviewed and is accurate.    Carlisle Cater, PA-C 08/13/13 [phone]

## 2013-08-13 NOTE — Discharge Instructions (Signed)
Please read and follow all provided instructions.  Your diagnoses today include:  1. Knee pain    Tests performed today include:  An x-ray of the affected area - does NOT show any broken bones  Vital signs. See below for your results today.   Medications prescribed:   Meloxicam - anti-inflammatory pain medication  You have been prescribed an anti-inflammatory medication or NSAID. Take with food. Do not take aspirin, ibuprofen, or naproxen if taking this medication. Take smallest effective dose for the shortest duration needed for your pain. Stop taking if you experience stomach pain or vomiting.    Tramadol - narcotic-like pain medication  DO NOT drive or perform any activities that require you to be awake and alert because this medicine can make you drowsy.   Take any prescribed medications only as directed.  Home care instructions:   Follow any educational materials contained in this packet  Follow R.I.C.E. Protocol:  R - rest your injury   I  - use ice on injury without applying directly to skin  C - compress injury with bandage or splint  E - elevate the injury as much as possible  Follow-up instructions: Please follow-up with your primary care provider or the provided orthopedic physician (bone specialist) if you continue to have significant pain or trouble walking in 1 week. In this case you may have a severe injury that requires further care.   If you do not have a primary care doctor -- see below for referral information.    Return instructions:   Please return if your toes are numb or tingling, appear gray or blue, or you have severe pain (also elevate leg and loosen splint or wrap if you were given one)  Please return to the Emergency Department if you experience worsening symptoms.   Please return if you have any other emergent concerns.  Additional Information:  Your vital signs today were: BP 128/55   Pulse 80   Temp(Src) 98.1 F (36.7 C) (Oral)    Resp 16   SpO2 100% If your blood pressure (BP) was elevated above 135/85 this visit, please have this repeated by your doctor within one month. --------------

## 2013-08-13 NOTE — ED Provider Notes (Signed)
Medical screening examination/treatment/procedure(s) were performed by non-physician practitioner and as supervising physician I was immediately available for consultation/collaboration.  Richarda Blade, MD 08/13/13 [phone]

## 2013-08-13 NOTE — ED Notes (Signed)
Pt states she injured her rt knee while riding on a stationary bike a month ago.  States it has still not healed.

## 2013-08-20 ENCOUNTER — Ambulatory Visit: Payer: Self-pay | Admitting: Internal Medicine

## 2013-08-20 ENCOUNTER — Encounter: Payer: Self-pay | Admitting: Internal Medicine

## 2013-09-17 ENCOUNTER — Encounter: Payer: Self-pay | Admitting: Internal Medicine

## 2013-09-17 ENCOUNTER — Ambulatory Visit (INDEPENDENT_AMBULATORY_CARE_PROVIDER_SITE_OTHER): Payer: Self-pay | Admitting: Internal Medicine

## 2013-09-17 VITALS — BP 135/74 | HR 79 | Temp 97.0°F | Ht 61.0 in | Wt 220.7 lb

## 2013-09-17 DIAGNOSIS — M25569 Pain in unspecified knee: Secondary | ICD-10-CM

## 2013-09-17 DIAGNOSIS — H60399 Other infective otitis externa, unspecified ear: Secondary | ICD-10-CM

## 2013-09-17 DIAGNOSIS — M25561 Pain in right knee: Secondary | ICD-10-CM | POA: Insufficient documentation

## 2013-09-17 MED ORDER — DICLOFENAC SODIUM 1 % TD GEL: 2.0000 g | Freq: Four times a day (QID) | TRANSDERMAL | Status: DC

## 2013-09-17 MED ORDER — NEOMYCIN-COLIST-HC-THONZONIUM 3.3-3-10-0.5 MG/ML OT SUSP: 4.0000 [drp] | Freq: Four times a day (QID) | OTIC | Status: DC

## 2013-09-17 NOTE — Progress Notes (Signed)
HPI The patient is a 63 y.o. female with a history of DM2, OSA, HTN, CKD, presenting with knee pain and ear pain.  The patient notes a 37-month history of R ear pain.  She describes the pain as a dull "ache", worsened by air blowing on the ear, with an occasional excrutiating "shooting" pain in the ear about once every 2 weeks, lasting a few seconds.  She has been self-treating by putting cotton balls in her ears at night, with no discharge or drainage noted.  The patient uses Q-tips to clean her ears once/week.  No change in hearing or balance.  No runny nose, sore throat, fevers, cough.  The patient notes a 19-month R knee pain.  She notes that the pain started gradually over a several hour period, after using a stationary bike.  She didn't hear a pop, click, or experience any acute onset of pain.  The pain is now localized to the right medial knee, worse with weight bearing, with palpable crepitus to the patient.  She notes minimal warmth and swelling, but no redness or swelling.  She has tried NSAIDs and tramadol, as well as crutches, with only minimal improvement.  ROS: General: no fevers, chills, changes in weight, changes in appetite Skin: no rash HEENT: see HPI Pulm: no dyspnea, coughing, wheezing CV: no chest pain, palpitations, shortness of breath Abd: no abdominal pain, nausea/vomiting, diarrhea/constipation GU: no dysuria, hematuria, polyuria Ext: see HPI Neuro: no weakness, numbness, or tingling  Filed Vitals:   09/17/13 0902  BP: 135/74  Pulse: 79  Temp: 97 F (36.1 C)    PEX General: alert, cooperative, and in no apparent distress HEENT: Right external auditory canal with erythema and faint excoriations, though with no edema or discharge.  Tympanic membrane intact, unremarkable.  Left ear with no abnormalities Neck: supple Lungs: clear to ascultation bilaterally, normal work of respiration, no wheezes, rales, ronchi Heart: regular rate and rhythm, no murmurs, gallops, or  rubs Abdomen: soft, non-tender, non-distended, normal bowel sounds Extremities: Right knee with focal TTP along medial joint line.  Crepitus palpated on passive movement of knee.  Full active and passive ROM Neurologic: alert & oriented X3, cranial nerves II-XII intact, strength grossly intact, sensation intact to light touch  Current Outpatient Prescriptions on File Prior to Visit  Medication Sig Dispense Refill  . albuterol (PROAIR HFA) 108 (90 BASE) MCG/ACT inhaler Inhale 1-2 puffs into the lungs every 6 (six) hours as needed for wheezing or shortness of breath.  1 Inhaler  2  . amLODipine (NORVASC) 10 MG tablet Take 1 tablet (10 mg total) by mouth daily.  90 tablet  3  . aspirin 325 MG tablet Take 325 mg by mouth daily.        . diphenhydrAMINE (BENADRYL) 25 MG tablet Take 25 mg by mouth Nightly.        . furosemide (LASIX) 20 MG tablet Take 1 tablet (20 mg total) by mouth 2 (two) times daily.  180 tablet  1  . insulin glargine (LANTUS) 100 UNIT/ML injection Inject 30 units into skin BID as directed  30 mL  6  . insulin lispro (HUMALOG) 100 UNIT/ML injection Inject 10 Units into the skin as needed. Three times per day with meals  30 mL  6  . lisinopril (PRINIVIL,ZESTRIL) 40 MG tablet Take 1 tablet (40 mg total) by mouth daily.  90 tablet  1  . meloxicam (MOBIC) 7.5 MG tablet Take 1 tablet (7.5 mg total) by mouth daily.  14 tablet  0  . rosuvastatin (CRESTOR) 10 MG tablet Take 1 tablet (10 mg total) by mouth at bedtime.  90 tablet  1  . traMADol (ULTRAM) 50 MG tablet Take 1 tablet (50 mg total) by mouth every 6 (six) hours as needed.  15 tablet  0  . [DISCONTINUED] fluticasone (FLONASE) 50 MCG/ACT nasal spray Place 2 sprays into the nose daily.  16 g  2   No current facility-administered medications on file prior to visit.    Assessment/Plan

## 2013-09-17 NOTE — Assessment & Plan Note (Signed)
The patient presents with a six-month history of right ear pain. Examination of the right ear reveals inflammation around the external auditory canal. This is consistent with otitis externa, possibly caused by repeated trauma from Q-tips and cotton balls. -Treat with Cortisporin ear drops -Instructed patient to avoid Q-tips and other foreign bodies in the ear

## 2013-09-17 NOTE — Patient Instructions (Addendum)
General Instructions: Your right ear has signs of irritation, which is likely due to irritation with Q-tips. -we will treat you with Cortisporin.  Use 4 drops in your R ear four times per day  Your knee pain likely represents a Meniscal Tear (see info below) -you can apply Voltaren gel to the area as needed -you may use Extra Strength Tylenol (650 mg) for pain -we can consider a referral to Sports Medicine if your symptoms do not improve  Please return for a follow-up visit in 3-4 weeks   Treatment Goals:  Goals (1 Years of Data) as of 09/17/13   None      Progress Toward Treatment Goals:  Treatment Goal 09/17/2013  Hemoglobin A1C at goal  Blood pressure at goal    Self Care Goals & Plans:  Self Care Goal 04/04/2013  Manage my medications take my medicines as prescribed; bring my medications to every visit; refill my medications on time  Monitor my health keep track of my blood glucose; check my feet daily  Eat healthy foods eat foods that are low in salt; eat baked foods instead of fried foods  Be physically active find an activity I enjoy; take a walk every day    Home Blood Glucose Monitoring 04/04/2013  Check my blood sugar 3 times a day  When to check my blood sugar -     Care Management & Community Referrals:  Referral 09/17/2013  Referrals made for care management support none needed  Referrals made to community resources -      Meniscus Tear with Phase I Rehab The meniscus is a C-shaped cartilage structure, located in the knee joint between the thigh bone (femur) and the shinbone (tibia). Two menisci are located in each knee joint: the inner and outer meniscus. The meniscus acts as an adapter between the thigh bone and shinbone, allowing them to fit properly together. It also functions as a shock absorber, to reduce the stress placed on the knee joint and to help supply nutrients to the knee joint cartilage. As people age, the meniscus begins to harden and become  more vulnerable to injury. Meniscus tears are a common injury, especially in older athletes. Inner meniscus tears are more common than outer meniscus tears.  SYMPTOMS   Pain in the knee, especially with standing or squatting with the affected leg.  Tenderness along the joint line.  Swelling in the knee joint (effusion), usually starting 1 to 2 days after injury.  Locking or catching of the knee joint, causing inability to straighten the knee completely.  Giving way or buckling of the knee. CAUSES  A meniscus tear occurs when a force is placed on the meniscus that is greater than it can handle. Common causes of injury include:  Direct hit (trauma) to the knee.  Twisting, pivoting, or cutting (rapidly changing direction while running), kneeling or squatting.  Without injury, due to aging. RISK INCREASES WITH:  Contact sports (football, rugby).  Sports in which cleats are used with pivoting (soccer, lacrosse) or sports in which good shoe grip and sudden change in direction are required (racquetball, basketball, squash).  Previous knee injury.  Associated knee injury, particularly ligament injuries.  Poor strength and flexibility. PREVENTION  Warm up and stretch properly before activity.  Maintain physical fitness:  Strength, flexibility, and endurance.  Cardiovascular fitness.  Protect the knee with a brace or elastic bandage.  Wear properly fitted protective equipment (proper cleats for the surface). PROGNOSIS  Sometimes, meniscus tears heal on  their own. However, definitive treatment requires surgery, followed by at least 6 weeks of recovery.  RELATED COMPLICATIONS   Recurring symptoms that result in a chronic problem.  Repeated knee injury, especially if sports are resumed too soon after injury or surgery.  Progression of the tear (the tear gets larger), if untreated.  Arthritis of the knee in later years (with or without surgery).  Complications of surgery,  including infection, bleeding, injury to nerves (numbness, weakness, paralysis) continued pain, giving way, locking, nonhealing of meniscus (if repaired), need for further surgery, and knee stiffness (loss of motion). TREATMENT  Treatment first involves the use of ice and medicine, to reduce pain and inflammation. You may find using crutches to walk more comfortable. However, it is okay to bear weight on the injured knee, if the pain will allow it. Surgery is often advised as a definitive treatment. Surgery is performed through an incision near the joint (arthroscopically). The torn piece of the meniscus is removed, and if possible the joint cartilage is repaired. After surgery, the joint must be restrained. After restraint, it is important to perform strengthening and stretching exercises to help regain strength and a full range of motion. These exercises may be completed at home or with a therapist.  MEDICATION  If pain medicine is needed, nonsteroidal anti-inflammatory medicines (aspirin and ibuprofen), or other minor pain relievers (acetaminophen), are often advised.  Do not take pain medicine for 7 days before surgery.  Prescription pain relievers may be given, if your caregiver thinks they are needed. Use only as directed and only as much as you need. HEAT AND COLD  Cold treatment (icing) should be applied for 10 to 15 minutes every 2 to 3 hours for inflammation and pain, and immediately after activity that aggravates your symptoms. Use ice packs or an ice massage.  Heat treatment may be used before performing stretching and strengthening activities prescribed by your caregiver, physical therapist, or athletic trainer. Use a heat pack or a warm water soak. SEEK MEDICAL CARE IF:   Symptoms get worse or do not improve in 2 weeks, despite treatment.  New, unexplained symptoms develop. (Drugs used in treatment may produce side effects.) EXERCISES RANGE OF MOTION (ROM) AND STRETCHING EXERCISES  - Meniscus Tear, Non-operative, Phase I These are some of the initial exercises with which you may start your rehabilitation program, until you see your caregiver again or until your symptoms are resolved. Remember:   These initial exercises are intended to be gentle. They will help you restore motion without increasing any swelling.  Completing these exercises allows less painful movement and prepares you for the more aggressive strengthening exercises in Phase II.  An effective stretch should be held for at least 30 seconds.  A stretch should never be painful. You should only feel a gentle lengthening or release in the stretched tissue. RANGE OF MOTION - Knee Flexion, Active  Lie on your back with both knees straight. (If this causes back discomfort, bend your healthy knee, placing your foot flat on the floor.)  Slowly slide your heel back toward your buttocks until you feel a gentle stretch in the front of your knee or thigh.  Hold for __________ seconds. Slowly slide your heel back to the starting position. Repeat __________ times. Complete this exercise __________ times per day.  RANGE OF MOTION - Knee Flexion and Extension, Active-Assisted  Sit on the edge of a table or chair with your thighs firmly supported. It may be helpful to place a  folded towel under the end of your right / left thigh.  Flexion (bending): Place the ankle of your healthy leg on top of the other ankle. Use your healthy leg to gently bend your right / left knee until you feel a mild tension across the top of your knee.  Hold for __________ seconds.  Extension (straightening): Switch your ankles so your right / left leg is on top. Use your healthy leg to straighten your right / left knee until you feel a mild tension on the backside of your knee.  Hold for __________ seconds. Repeat __________ times. Complete __________ times per day. STRETCH - Knee Flexion, Supine  Lie on the floor with your right / left  heel and foot lightly touching the wall. (Place both feet on the wall if you do not use a door frame.)  Without using any effort, allow gravity to slide your foot down the wall slowly until you feel a gentle stretch in the front of your right / left knee.  Hold this stretch for __________ seconds. Then return the leg to the starting position, using your healthy leg for help, if needed. Repeat __________ times. Complete this stretch __________ times per day.  STRETCH - Knee Extension Sitting  Sit with your right / left leg/heel propped on another chair, coffee table, or foot stool.  Allow your leg muscles to relax, letting gravity straighten out your knee.*  You should feel a stretch behind your right / left knee. Hold this position for __________ seconds. Repeat __________ times. Complete this stretch __________ times per day.  *Your physician, physical therapist or athletic trainer may instruct you place a __________ weight on your thigh, just above your kneecap, to deepen the stretch.  STRENGTHENING EXERCISES - Meniscus Tear, Non-operative, Phase I These exercises may help you when beginning to rehabilitate your injury. They may resolve your symptoms with or without further involvement from your physician, physical therapist or athletic trainer. While completing these exercises, remember:   Muscles can gain both the endurance and the strength needed for everyday activities through controlled exercises.  Complete these exercises as instructed by your physician, physical therapist or athletic trainer. Progress the resistance and repetitions only as guided. STRENGTH - Quadriceps, Isometrics  Lie on your back with your right / left leg extended and your opposite knee bent.  Gradually tense the muscles in the front of your right / left thigh. You should see either your knee cap slide up toward your hip or increased dimpling just above the knee. This motion will push the back of the knee down  toward the floor, mat, or bed on which you are lying.  Hold the muscle as tight as you can, without increasing your pain, for __________ seconds.  Relax the muscles slowly and completely between each repetition. Repeat __________ times. Complete this exercise __________ times per day.  STRENGTH - Quadriceps, Short Arcs   Lie on your back. Place a __________ inch towel roll under your right / left knee, so that the knee bends slightly.  Raise only your lower leg by tightening the muscles in the front of your thigh. Do not allow your thigh to rise.  Hold this position for __________ seconds. Repeat __________ times. Complete this exercise __________ times per day.  OPTIONAL ANKLE WEIGHTS: Begin with ____________________, but DO NOT exceed ____________________. Increase in 1 pound/0.5 kilogram increments. STRENGTH - Quadriceps, Straight Leg Raises  Quality counts! Watch for signs that the quadriceps muscle is working, to be  sure you are strengthening the correct muscles and not "cheating" by substituting with healthier muscles.  Lay on your back with your right / left leg extended and your opposite knee bent.  Tense the muscles in the front of your right / left thigh. You should see either your knee cap slide up or increased dimpling just above the knee. Your thigh may even shake a bit.  Tighten these muscles even more and raise your leg 4 to 6 inches off the floor. Hold for __________ seconds.  Keeping these muscles tense, lower your leg.  Relax the muscles slowly and completely in between each repetition. Repeat __________ times. Complete this exercise __________ times per day.  STRENGTH - Hamstring, Curls   Lay on your stomach with your legs extended. (If you lay on a bed, your feet may hang over the edge.)  Tighten the muscles in the back of your thigh to bend your right / left knee up to 90 degrees. Keep your hips flat on the bed.  Hold this position for __________  seconds.  Slowly lower your leg back to the starting position. Repeat __________ times. Complete this exercise __________ times per day.  STRENGTH  Quadriceps, Squats  Stand in a door frame so that your feet and knees are in line with the frame.  Use your hands for balance, not support, on the frame.  Slowly lower your weight, bending at the hips and knees. Keep your lower legs upright so that they are parallel with the door frame. Squat only within the range that does not increase your knee pain. Never let your hips drop below your knees.  Slowly return upright, pushing with your legs, not pulling with your hands. Repeat __________ times. Complete this exercise __________ times per day.  STRENGTH - Quad/VMO, Isometric   Sit in a chair with your right / left knee slightly bent. With your fingertips, feel the VMO muscle just above the inside of your knee. The VMO is important in controlling the position of your kneecap.  Keeping your fingertips on this muscle. Without actually moving your leg, attempt to drive your knee down as if straightening your leg. You should feel your VMO tense. If you have a difficult time, you may wish to try the same exercise on your healthy knee first.  Tense this muscle as hard as you can without increasing any knee pain.  Hold for __________ seconds. Relax the muscles slowly and completely in between each repetition. Repeat __________ times. Complete exercise __________ times per day.  Document Released: 05/16/1998 Document Revised: 07/25/2011 Document Reviewed: 08/14/2008 [facility] Patient Information 2014 [place], [place].

## 2013-09-17 NOTE — Assessment & Plan Note (Signed)
The patient notes a three-month history of right knee pain. On physical exam, pain is limited to the medial joint line. This appears consistent with a medial meniscus tear. X-ray knee negative.  We discussed options for treatment, including conservative treatment with pain medications, and consideration of operative treatment. The patient strongly opposes operative treatment. She prefers to try conservative treatment for now, though would consider referral to sports medicine once she obtains insurance, expected in November 2015. -due to patient's concern about cost, and lack of any health insurance, we will defer definitive diagnosis with MRI for now -treat with voltaren gel and extra strength tylenol -will avoid systemic NSAIDs, given CKD, and pt noted significant drowsiness with Tramadol -consider sports med referral in the future

## 2013-09-19 NOTE — Progress Notes (Signed)
Case discussed with Dr. Brown at the time of the visit.  We reviewed the resident's history and exam and pertinent patient test results.  I agree with the assessment, diagnosis, and plan of care documented in the resident's note. 

## 2013-10-24 ENCOUNTER — Other Ambulatory Visit: Payer: Self-pay | Admitting: *Deleted

## 2013-10-24 DIAGNOSIS — N183 Chronic kidney disease, stage 3 unspecified: Secondary | ICD-10-CM

## 2013-10-24 DIAGNOSIS — E119 Type 2 diabetes mellitus without complications: Secondary | ICD-10-CM

## 2013-10-24 MED ORDER — LISINOPRIL 40 MG PO TABS: 40.0000 mg | ORAL_TABLET | Freq: Every day | ORAL | Status: DC

## 2013-10-24 NOTE — Telephone Encounter (Signed)
Rx called in to pharmacy. 

## 2013-10-25 ENCOUNTER — Other Ambulatory Visit: Payer: Self-pay | Admitting: *Deleted

## 2013-10-25 DIAGNOSIS — E785 Hyperlipidemia, unspecified: Secondary | ICD-10-CM

## 2013-10-25 MED ORDER — ROSUVASTATIN CALCIUM 10 MG PO TABS: 10.0000 mg | ORAL_TABLET | Freq: Every day | ORAL | Status: DC

## 2013-11-12 ENCOUNTER — Other Ambulatory Visit: Payer: Self-pay | Admitting: *Deleted

## 2013-11-12 DIAGNOSIS — I10 Essential (primary) hypertension: Secondary | ICD-10-CM

## 2013-11-12 MED ORDER — FUROSEMIDE 20 MG PO TABS: 20.0000 mg | ORAL_TABLET | Freq: Two times a day (BID) | ORAL | Status: DC

## 2013-11-12 NOTE — Telephone Encounter (Signed)
Rx called in to pharmacy. 

## 2014-02-07 ENCOUNTER — Other Ambulatory Visit: Payer: Self-pay | Admitting: *Deleted

## 2014-02-07 MED ORDER — ALBUTEROL SULFATE HFA 108 (90 BASE) MCG/ACT IN AERS: 1.0000 | INHALATION_SPRAY | Freq: Four times a day (QID) | RESPIRATORY_TRACT | Status: DC | PRN

## 2014-02-07 NOTE — Telephone Encounter (Signed)
Rx called in to pharmacy. 

## 2014-02-19 ENCOUNTER — Encounter: Payer: Self-pay | Admitting: *Deleted

## 2014-03-28 ENCOUNTER — Encounter: Payer: Self-pay | Admitting: Internal Medicine

## 2014-04-18 ENCOUNTER — Encounter: Payer: Self-pay | Admitting: Internal Medicine

## 2014-04-18 ENCOUNTER — Other Ambulatory Visit (HOSPITAL_COMMUNITY): Payer: Self-pay | Admitting: Internal Medicine

## 2014-04-18 DIAGNOSIS — Z1231 Encounter for screening mammogram for malignant neoplasm of breast: Secondary | ICD-10-CM

## 2014-05-02 ENCOUNTER — Other Ambulatory Visit: Payer: Self-pay | Admitting: *Deleted

## 2014-05-02 ENCOUNTER — Ambulatory Visit (HOSPITAL_COMMUNITY)
Admission: RE | Admit: 2014-05-02 | Discharge: 2014-05-02 | Disposition: A | Discharge status: Home / Self Care | Payer: Commercial Managed Care - HMO | Source: Ambulatory Visit | Attending: Internal Medicine | Admitting: Internal Medicine

## 2014-05-02 DIAGNOSIS — E119 Type 2 diabetes mellitus without complications: Secondary | ICD-10-CM

## 2014-05-02 DIAGNOSIS — Z1231 Encounter for screening mammogram for malignant neoplasm of breast: Secondary | ICD-10-CM

## 2014-05-02 MED ORDER — INSULIN GLARGINE 100 UNIT/ML ~~LOC~~ SOLN: SUBCUTANEOUS | Status: DC

## 2014-05-05 NOTE — Telephone Encounter (Signed)
Called to pharm 

## 2014-05-15 ENCOUNTER — Ambulatory Visit (HOSPITAL_COMMUNITY)
Admission: RE | Admit: 2014-05-15 | Discharge: 2014-05-15 | Disposition: A | Discharge status: Home / Self Care | Payer: Commercial Managed Care - HMO | Source: Ambulatory Visit | Attending: Internal Medicine | Admitting: Internal Medicine

## 2014-05-15 ENCOUNTER — Other Ambulatory Visit (HOSPITAL_COMMUNITY): Payer: Self-pay | Admitting: Internal Medicine

## 2014-05-15 DIAGNOSIS — M79609 Pain in unspecified limb: Secondary | ICD-10-CM

## 2014-05-15 DIAGNOSIS — Z86718 Personal history of other venous thrombosis and embolism: Secondary | ICD-10-CM | POA: Diagnosis not present

## 2014-05-15 DIAGNOSIS — R7989 Other specified abnormal findings of blood chemistry: Secondary | ICD-10-CM

## 2014-05-15 DIAGNOSIS — M79661 Pain in right lower leg: Secondary | ICD-10-CM | POA: Insufficient documentation

## 2014-05-15 NOTE — Progress Notes (Signed)
VASCULAR LAB PRELIMINARY  PRELIMINARY  PRELIMINARY  PRELIMINARY  Right lower extremity venous duplex completed.    Preliminary report:  Superficial thrombosis noted in the right GSV in the upper calf at the area of pain.   No evidence of DVT.  Julias Mould, RVT 05/15/2014, 3:00 PM

## 2014-08-05 DIAGNOSIS — C50911 Malignant neoplasm of unspecified site of right female breast: Secondary | ICD-10-CM | POA: Diagnosis not present

## 2014-08-05 DIAGNOSIS — E1122 Type 2 diabetes mellitus with diabetic chronic kidney disease: Secondary | ICD-10-CM | POA: Diagnosis not present

## 2014-08-05 DIAGNOSIS — N183 Chronic kidney disease, stage 3 (moderate): Secondary | ICD-10-CM | POA: Diagnosis not present

## 2014-08-05 DIAGNOSIS — H9209 Otalgia, unspecified ear: Secondary | ICD-10-CM | POA: Diagnosis not present

## 2014-08-05 DIAGNOSIS — I10 Essential (primary) hypertension: Secondary | ICD-10-CM | POA: Diagnosis not present

## 2014-08-05 DIAGNOSIS — G4730 Sleep apnea, unspecified: Secondary | ICD-10-CM | POA: Diagnosis not present

## 2014-08-05 DIAGNOSIS — Z0000 Encounter for general adult medical examination without abnormal findings: Secondary | ICD-10-CM | POA: Diagnosis not present

## 2014-08-05 DIAGNOSIS — E663 Overweight: Secondary | ICD-10-CM | POA: Diagnosis not present

## 2014-08-18 ENCOUNTER — Other Ambulatory Visit: Payer: Self-pay | Admitting: Obstetrics & Gynecology

## 2014-08-18 ENCOUNTER — Other Ambulatory Visit (HOSPITAL_COMMUNITY)
Admission: RE | Admit: 2014-08-18 | Discharge: 2014-08-18 | Disposition: A | Discharge status: Home / Self Care | Payer: Commercial Managed Care - HMO | Source: Ambulatory Visit | Attending: Obstetrics & Gynecology | Admitting: Obstetrics & Gynecology

## 2014-08-18 DIAGNOSIS — Z1151 Encounter for screening for human papillomavirus (HPV): Secondary | ICD-10-CM | POA: Diagnosis not present

## 2014-08-18 DIAGNOSIS — Z124 Encounter for screening for malignant neoplasm of cervix: Secondary | ICD-10-CM | POA: Insufficient documentation

## 2014-08-18 DIAGNOSIS — R87810 Cervical high risk human papillomavirus (HPV) DNA test positive: Secondary | ICD-10-CM | POA: Insufficient documentation

## 2014-08-18 DIAGNOSIS — Z853 Personal history of malignant neoplasm of breast: Secondary | ICD-10-CM | POA: Diagnosis not present

## 2014-08-18 DIAGNOSIS — E1122 Type 2 diabetes mellitus with diabetic chronic kidney disease: Secondary | ICD-10-CM | POA: Diagnosis not present

## 2014-08-18 DIAGNOSIS — Z01419 Encounter for gynecological examination (general) (routine) without abnormal findings: Secondary | ICD-10-CM | POA: Diagnosis not present

## 2014-08-19 LAB — CYTOLOGY - PAP

## 2014-09-16 DIAGNOSIS — I10 Essential (primary) hypertension: Secondary | ICD-10-CM | POA: Diagnosis not present

## 2014-09-16 DIAGNOSIS — E119 Type 2 diabetes mellitus without complications: Secondary | ICD-10-CM | POA: Diagnosis not present

## 2014-09-16 DIAGNOSIS — M109 Gout, unspecified: Secondary | ICD-10-CM | POA: Diagnosis not present

## 2014-09-16 DIAGNOSIS — N183 Chronic kidney disease, stage 3 (moderate): Secondary | ICD-10-CM | POA: Diagnosis not present

## 2014-09-20 DIAGNOSIS — I129 Hypertensive chronic kidney disease with stage 1 through stage 4 chronic kidney disease, or unspecified chronic kidney disease: Secondary | ICD-10-CM | POA: Diagnosis not present

## 2014-09-20 DIAGNOSIS — I82401 Acute embolism and thrombosis of unspecified deep veins of right lower extremity: Secondary | ICD-10-CM | POA: Diagnosis not present

## 2014-09-20 DIAGNOSIS — R0602 Shortness of breath: Secondary | ICD-10-CM | POA: Diagnosis not present

## 2014-09-20 DIAGNOSIS — Z86718 Personal history of other venous thrombosis and embolism: Secondary | ICD-10-CM | POA: Diagnosis not present

## 2014-09-20 DIAGNOSIS — N189 Chronic kidney disease, unspecified: Secondary | ICD-10-CM | POA: Diagnosis not present

## 2014-09-20 DIAGNOSIS — M79604 Pain in right leg: Secondary | ICD-10-CM | POA: Diagnosis not present

## 2014-09-20 DIAGNOSIS — I82411 Acute embolism and thrombosis of right femoral vein: Secondary | ICD-10-CM | POA: Diagnosis not present

## 2014-09-20 DIAGNOSIS — Z86711 Personal history of pulmonary embolism: Secondary | ICD-10-CM | POA: Diagnosis not present

## 2014-09-20 DIAGNOSIS — I2699 Other pulmonary embolism without acute cor pulmonale: Secondary | ICD-10-CM | POA: Diagnosis not present

## 2014-09-20 DIAGNOSIS — R079 Chest pain, unspecified: Secondary | ICD-10-CM | POA: Diagnosis not present

## 2014-09-21 DIAGNOSIS — R2241 Localized swelling, mass and lump, right lower limb: Secondary | ICD-10-CM | POA: Diagnosis not present

## 2014-09-22 ENCOUNTER — Emergency Department (HOSPITAL_COMMUNITY): Payer: Commercial Managed Care - HMO

## 2014-09-22 ENCOUNTER — Observation Stay (HOSPITAL_COMMUNITY)
Admission: EM | Admit: 2014-09-22 | Discharge: 2014-09-24 | Disposition: A | Discharge status: Home / Self Care | Payer: Commercial Managed Care - HMO | Attending: Internal Medicine | Admitting: Internal Medicine

## 2014-09-22 ENCOUNTER — Encounter (HOSPITAL_COMMUNITY): Payer: Self-pay | Admitting: Nurse Practitioner

## 2014-09-22 DIAGNOSIS — E119 Type 2 diabetes mellitus without complications: Secondary | ICD-10-CM | POA: Diagnosis not present

## 2014-09-22 DIAGNOSIS — E785 Hyperlipidemia, unspecified: Secondary | ICD-10-CM | POA: Insufficient documentation

## 2014-09-22 DIAGNOSIS — G4733 Obstructive sleep apnea (adult) (pediatric): Secondary | ICD-10-CM | POA: Diagnosis not present

## 2014-09-22 DIAGNOSIS — Z7901 Long term (current) use of anticoagulants: Secondary | ICD-10-CM | POA: Diagnosis not present

## 2014-09-22 DIAGNOSIS — N184 Chronic kidney disease, stage 4 (severe): Secondary | ICD-10-CM | POA: Diagnosis not present

## 2014-09-22 DIAGNOSIS — Z794 Long term (current) use of insulin: Secondary | ICD-10-CM | POA: Insufficient documentation

## 2014-09-22 DIAGNOSIS — Z853 Personal history of malignant neoplasm of breast: Secondary | ICD-10-CM | POA: Diagnosis not present

## 2014-09-22 DIAGNOSIS — I129 Hypertensive chronic kidney disease with stage 1 through stage 4 chronic kidney disease, or unspecified chronic kidney disease: Secondary | ICD-10-CM | POA: Diagnosis not present

## 2014-09-22 DIAGNOSIS — I82409 Acute embolism and thrombosis of unspecified deep veins of unspecified lower extremity: Secondary | ICD-10-CM | POA: Diagnosis present

## 2014-09-22 DIAGNOSIS — R0602 Shortness of breath: Secondary | ICD-10-CM | POA: Diagnosis not present

## 2014-09-22 DIAGNOSIS — I82401 Acute embolism and thrombosis of unspecified deep veins of right lower extremity: Secondary | ICD-10-CM | POA: Diagnosis not present

## 2014-09-22 DIAGNOSIS — I10 Essential (primary) hypertension: Secondary | ICD-10-CM | POA: Diagnosis present

## 2014-09-22 DIAGNOSIS — I2699 Other pulmonary embolism without acute cor pulmonale: Principal | ICD-10-CM | POA: Diagnosis present

## 2014-09-22 DIAGNOSIS — R2241 Localized swelling, mass and lump, right lower limb: Secondary | ICD-10-CM | POA: Diagnosis not present

## 2014-09-22 LAB — CBC
HCT: 30.9 % — ABNORMAL LOW (ref 36.0–46.0)
HEMOGLOBIN: 10.4 g/dL — AB (ref 12.0–15.0)
MCH: 29.5 pg (ref 26.0–34.0)
MCHC: 33.7 g/dL (ref 30.0–36.0)
MCV: 87.8 fL (ref 78.0–100.0)
Platelets: 220 10*3/uL (ref 150–400)
RBC: 3.52 MIL/uL — ABNORMAL LOW (ref 3.87–5.11)
RDW: 12.4 % (ref 11.5–15.5)
WBC: 6.5 10*3/uL (ref 4.0–10.5)

## 2014-09-22 LAB — I-STAT TROPONIN, ED: Troponin i, poc: 0.01 ng/mL (ref 0.00–0.08)

## 2014-09-22 LAB — BASIC METABOLIC PANEL
Anion gap: 7 (ref 5–15)
BUN: 33 mg/dL — ABNORMAL HIGH (ref 6–20)
CO2: 23 mmol/L (ref 22–32)
Calcium: 9.4 mg/dL (ref 8.9–10.3)
Chloride: 109 mmol/L (ref 101–111)
Creatinine, Ser: 2.4 mg/dL — ABNORMAL HIGH (ref 0.44–1.00)
GFR calc Af Amer: 23 mL/min — ABNORMAL LOW (ref >60)
GFR calc non Af Amer: 20 mL/min — ABNORMAL LOW (ref >60)
GLUCOSE: 111 mg/dL — AB (ref 70–99)
Potassium: 4.3 mmol/L (ref 3.5–5.1)
SODIUM: 139 mmol/L (ref 135–145)

## 2014-09-22 LAB — PROTIME-INR
INR: 1.31 (ref 0.00–1.49)
Prothrombin Time: 16.4 seconds — ABNORMAL HIGH (ref 11.6–15.2)

## 2014-09-22 LAB — APTT: aPTT: 41 seconds — ABNORMAL HIGH (ref 24–37)

## 2014-09-22 MED ORDER — FENTANYL CITRATE (PF) 100 MCG/2ML IJ SOLN
50.0000 ug | Freq: Once | INTRAMUSCULAR | Status: AC
  Administered 2014-09-22: 50 ug via INTRAVENOUS
  Filled 2014-09-22: qty 2

## 2014-09-22 MED ORDER — HEPARIN (PORCINE) IN NACL 100-0.45 UNIT/ML-% IJ SOLN
1200.0000 [IU]/h | INTRAMUSCULAR | Status: DC
  Administered 2014-09-22: 1200 [IU]/h via INTRAVENOUS
  Filled 2014-09-22: qty 250

## 2014-09-22 NOTE — ED Provider Notes (Signed)
CSN: [id number]     Arrival date & time 09/22/14  1844 History   First MD Initiated Contact with Patient 09/22/14 2142     Chief Complaint  Patient presents with  . Shortness of Breath     (Consider location/radiation/quality/duration/timing/severity/associated sxs/prior Treatment) HPI Comments: Patient states she was visiting [place] over the weekend and developed pain in her right leg. She was seen at a hospital and diagnosed with a DVT and PE. She has a history of a blood clot in 2010 and was treated with Coumadin for 2 years. Patient was given 3 doses of Eliquis and told to return to [place] to follow up with her PCP. She saw Dr. polite today who referred her to the hospital for admission. Patient endorses shortness of breath but denies any chest pain. She denies any cough or fever. She complains of pain in her right calf. He last took Eliquis this morning. Patient with a remote history of breast cancer, diabetes, PE, hyperlipidemia and hypertension and CK-MB. She denies any cough, fever, abdominal pain. She states she had a VQ scan due to her CK D.  The history is provided by the patient.    Past Medical History  Diagnosis Date  . OBSTRUCTIVE SLEEP APNEA     CPAP  . PULMONARY EMBOLISM 09/24/2008    anticoag thru 03/2010  . Proteinuria   . BREAST CANCER, HX OF 1995    Right Breast  . ANEMIA-NOS   . DIABETES MELLITUS, TYPE II   . HYPERLIPIDEMIA   . HYPERTENSION   . CHRONIC KIDNEY DISEASE STAGE III (MODERATE)    Past Surgical History  Procedure Laterality Date  . Mastectomy  1995    Right  . Insertion port a cath    . Port-a-cath removal     Family History  Problem Relation Age of Onset  . Diabetes Sister    History  Substance Use Topics  . Smoking status: Never Smoker   . Smokeless tobacco: Not on file     Comment: Lives alone-divorced  . Alcohol Use: No   OB History    No data available     Review of Systems  Constitutional: Negative for fever, activity change and  appetite change.  HENT: Negative for congestion and rhinorrhea.   Eyes: Negative for visual disturbance.  Respiratory: Positive for shortness of breath. Negative for cough and chest tightness.   Cardiovascular: Positive for leg swelling. Negative for chest pain.  Gastrointestinal: Negative for nausea, vomiting and abdominal pain.  Genitourinary: Negative for dysuria, hematuria, vaginal bleeding and vaginal discharge.  Musculoskeletal: Positive for myalgias and arthralgias.  Skin: Negative for rash.  Neurological: Negative for dizziness, weakness and headaches.  A complete 10 system review of systems was obtained and all systems are negative except as noted in the HPI and PMH.      Allergies  Review of patient's allergies indicates no known allergies.  Home Medications   Prior to Admission medications   Medication Sig Start Date End Date Taking? Authorizing Provider  amLODipine (NORVASC) 10 MG tablet Take 1 tablet (10 mg total) by mouth daily. 04/04/13  Yes Juluis Mire, MD  colchicine 0.6 MG tablet Take 0.6 mg by mouth daily.  07/13/14  Yes Historical Provider, MD  ELIQUIS 5 MG TABS tablet Take 5 mg by mouth 2 (two) times daily.  09/21/14  Yes Historical Provider, MD  lisinopril (PRINIVIL,ZESTRIL) 40 MG tablet Take 1 tablet (40 mg total) by mouth daily. 10/24/13  Yes Juluis Mire, MD  rosuvastatin (CRESTOR) 10 MG tablet Take 1 tablet (10 mg total) by mouth at bedtime. 10/25/13 10/25/14 Yes Marjan Rabbani, MD  albuterol (PROAIR HFA) 108 (90 BASE) MCG/ACT inhaler Inhale 1-2 puffs into the lungs every 6 (six) hours as needed for wheezing or shortness of breath. Patient not taking: Reported on 09/22/2014 02/07/14   Juluis Mire, MD  diclofenac sodium (VOLTAREN) 1 % GEL Apply 2 g topically 4 (four) times daily. Patient not taking: Reported on 09/22/2014 09/17/13   Hester Mates, MD  furosemide (LASIX) 20 MG tablet Take 1 tablet (20 mg total) by mouth 2 (two) times daily. Patient not taking:  Reported on 09/22/2014 11/12/13   Juluis Mire, MD  insulin glargine (LANTUS) 100 UNIT/ML injection Inject 30 units into skin BID as directed Patient not taking: Reported on 09/22/2014 05/02/14   Juluis Mire, MD  insulin lispro (HUMALOG) 100 UNIT/ML injection Inject 10 Units into the skin as needed. Three times per day with meals Patient not taking: Reported on 09/22/2014 04/04/13   Juluis Mire, MD  meloxicam (MOBIC) 7.5 MG tablet Take 1 tablet (7.5 mg total) by mouth daily. Patient not taking: Reported on 09/22/2014 08/13/13   Carlisle Cater, PA-C  neomycin-colistin-hydrocortisone-thonzonium (CORTISPORIN-TC) 3.07-16-08-0.5 MG/ML otic suspension Place 4 drops into the right ear 4 (four) times daily. Patient not taking: Reported on 09/22/2014 09/17/13   Hester Mates, MD  traMADol (ULTRAM) 50 MG tablet Take 1 tablet (50 mg total) by mouth every 6 (six) hours as needed. Patient not taking: Reported on 09/22/2014 08/13/13   Carlisle Cater, PA-C   BP 138/65 mmHg  Pulse 81  Temp(Src) 98 F (36.7 C) (Oral)  Resp 19  Ht 5\' 2"  (1.575 m)  Wt 207 lb (93.895 kg)  BMI 37.85 kg/m2  SpO2 97% Physical Exam  Constitutional: She is oriented to person, place, and time. She appears well-developed and well-nourished. No distress.  HENT:  Head: Normocephalic and atraumatic.  Mouth/Throat: Oropharynx is clear and moist. No oropharyngeal exudate.  Eyes: Conjunctivae and EOM are normal. Pupils are equal, round, and reactive to light.  Neck: Normal range of motion. Neck supple.  No meningismus.  Cardiovascular: Normal rate, regular rhythm, normal heart sounds and intact distal pulses.   No murmur heard. Pulmonary/Chest: Effort normal and breath sounds normal. No respiratory distress.  Abdominal: Soft. There is no tenderness. There is no rebound and no guarding.  Musculoskeletal: Normal range of motion. She exhibits tenderness. She exhibits no edema.  RLE tenderness Intact DP and PT pusles  Neurological: She is alert  and oriented to person, place, and time. No cranial nerve deficit. She exhibits normal muscle tone. Coordination normal.  No ataxia on finger to nose bilaterally. No pronator drift. 5/5 strength throughout. CN 2-12 intact. Negative Romberg. Equal grip strength. Sensation intact. Gait is normal.   Skin: Skin is warm.  Psychiatric: She has a normal mood and affect. Her behavior is normal.  Nursing note and vitals reviewed.   ED Course  Procedures (including critical care time) Labs Review Labs Reviewed  CBC - Abnormal; Notable for the following:    RBC 3.52 (*)    Hemoglobin 10.4 (*)    HCT 30.9 (*)    All other components within normal limits  BASIC METABOLIC PANEL - Abnormal; Notable for the following:    Glucose, Bld 111 (*)    BUN 33 (*)    Creatinine, Ser 2.40 (*)    GFR calc non Af Amer 20 (*)    GFR  calc Af Amer 23 (*)    All other components within normal limits  PROTIME-INR - Abnormal; Notable for the following:    Prothrombin Time 16.4 (*)    All other components within normal limits  APTT - Abnormal; Notable for the following:    aPTT 41 (*)    All other components within normal limits  HEPARIN LEVEL (UNFRACTIONATED)  CBC  APTT  I-STAT TROPOININ, ED    Imaging Review Dg Chest Portable 1 View  09/22/2014   CLINICAL DATA:  Shortness of breath today. History of pulmonary embolus, breast cancer, diabetes.  EXAM: PORTABLE CHEST - 1 VIEW  COMPARISON:  02/04/2013  FINDINGS: The heart size and mediastinal contours are within normal limits. Both lungs are clear. The visualized skeletal structures are unremarkable. Calcification of the aorta.  IMPRESSION: No active disease.   Electronically Signed   By: Lucienne Capers M.D.   On: 09/22/2014 22:18     EKG Interpretation   Date/Time:  Monday Sep 22 2014 19:00:28 EDT Ventricular Rate:  88 PR Interval:  188 QRS Duration: 96 QT Interval:  376 QTC Calculation: [id number] R Axis:   -25 Text Interpretation:  Normal sinus rhythm Left  ventricular hypertrophy  Abnormal ECG No significant change was found Confirmed by Wyvonnia Dusky  MD,  Torria Fromer [phone]) on 09/22/2014 9:51:22 PM      MDM   Final diagnoses:  Pulmonary embolism   Patient diagnosed with PE and DVT and outside hospital 2 days ago. Referred by PCP today for admission for anticoagulation. She was discharged Eliquis. She reports shortness of breath but is in no distress, appears mildly to But no hypoxia. No chest pain.  Creatinine elevated 2.4 which is close to her baseline. Concerning the Eliquis is not appropriate for her renal function.  Discussed with pharmacy who states patient might be better served on Coumadin that she is taking Eliquis incorrectly and with her renal function she may need a lower dose.  Unclear why PCP referred for admission as she is clinically stable and not hypoxic. Discussed with Dr. Inda Merlin on call for Dr. polite. He feels appropriate for admission for heparinization and transition to Coumadin.  Discussed with Dr. Truman Hayward who will admit patient on Coumadin drip.   Ezequiel Essex, MD 09/23/14 [phone]

## 2014-09-22 NOTE — ED Notes (Signed)
Pt did not answer x 1 

## 2014-09-22 NOTE — ED Notes (Signed)
She was diagnosed with PE and RLE DVT at a hospital in [place]. She wanted to return home to [facility] for admission so they gave her eliquis and let her drive back to [place]. She reports the SOB is worse on exertion. She has pain in R leg

## 2014-09-22 NOTE — Progress Notes (Signed)
ANTICOAGULATION CONSULT NOTE - Initial Consult  Pharmacy Consult for Heparin Indication: pulmonary embolus  No Known Allergies  Patient Measurements: Height: 5\' 2"  (157.5 cm) Weight: 207 lb (93.895 kg) IBW/kg (Calculated) : 50.1 Heparin Dosing Weight: 72 kg  Vital Signs: Temp: 98 F (36.7 C) (05/09 2131) Temp Source: Oral (05/09 2131) BP: 146/59 mmHg (05/09 2131) Pulse Rate: 88 (05/09 2131)  Labs:  Recent Labs  09/22/14 1918  HGB 10.4*  HCT 30.9*  PLT 220  APTT 41*  LABPROT 16.4*  INR 1.31  CREATININE 2.40*    Estimated Creatinine Clearance: 24.9 mL/min (by C-G formula based on Cr of 2.4).   Medical History: Past Medical History  Diagnosis Date  . OBSTRUCTIVE SLEEP APNEA     CPAP  . PULMONARY EMBOLISM 09/24/2008    anticoag thru 03/2010  . Proteinuria   . BREAST CANCER, HX OF 1995    Right Breast  . ANEMIA-NOS   . DIABETES MELLITUS, TYPE II   . HYPERLIPIDEMIA   . HYPERTENSION   . CHRONIC KIDNEY DISEASE STAGE III (MODERATE)     Medications:   (Not in a hospital admission) Scheduled:  Infusions:   Assessment: 64yo female with history of PE, CKD3, and breast CA presents with SOB. Pt was diagnosed with PE/DVT in [place] over the weekend and sent out on Eliquis prior to driving back to [place]. Pharmacy is consulted to dose heparin for PE. Hgb 10.4, Plt 220, sCr 2.4.  Pt last took Eliquis at 1030 this morning.  Goal of Therapy:  Heparin level 0.3-0.7 units/ml Monitor platelets by anticoagulation protocol: Yes   Plan:  Start heparin infusion at 1200 units/hr Check anti-Xa level in 8 hours and daily while on heparin Continue to monitor H&H and platelets  F/u long-term anticoag plan  Andrey Cota. Diona Foley, PharmD Clinical Pharmacist Pager [phone] 09/22/2014,10:00 PM

## 2014-09-23 ENCOUNTER — Encounter (HOSPITAL_COMMUNITY): Payer: Self-pay | Admitting: Internal Medicine

## 2014-09-23 DIAGNOSIS — I82401 Acute embolism and thrombosis of unspecified deep veins of right lower extremity: Secondary | ICD-10-CM | POA: Diagnosis not present

## 2014-09-23 DIAGNOSIS — Z853 Personal history of malignant neoplasm of breast: Secondary | ICD-10-CM

## 2014-09-23 DIAGNOSIS — E119 Type 2 diabetes mellitus without complications: Secondary | ICD-10-CM | POA: Diagnosis not present

## 2014-09-23 DIAGNOSIS — I2699 Other pulmonary embolism without acute cor pulmonale: Secondary | ICD-10-CM | POA: Diagnosis not present

## 2014-09-23 DIAGNOSIS — Z7901 Long term (current) use of anticoagulants: Secondary | ICD-10-CM | POA: Diagnosis not present

## 2014-09-23 DIAGNOSIS — G4733 Obstructive sleep apnea (adult) (pediatric): Secondary | ICD-10-CM | POA: Diagnosis not present

## 2014-09-23 DIAGNOSIS — N184 Chronic kidney disease, stage 4 (severe): Secondary | ICD-10-CM

## 2014-09-23 DIAGNOSIS — I129 Hypertensive chronic kidney disease with stage 1 through stage 4 chronic kidney disease, or unspecified chronic kidney disease: Secondary | ICD-10-CM | POA: Diagnosis not present

## 2014-09-23 DIAGNOSIS — E1165 Type 2 diabetes mellitus with hyperglycemia: Secondary | ICD-10-CM

## 2014-09-23 DIAGNOSIS — I82409 Acute embolism and thrombosis of unspecified deep veins of unspecified lower extremity: Secondary | ICD-10-CM | POA: Diagnosis present

## 2014-09-23 DIAGNOSIS — I10 Essential (primary) hypertension: Secondary | ICD-10-CM

## 2014-09-23 DIAGNOSIS — E785 Hyperlipidemia, unspecified: Secondary | ICD-10-CM | POA: Diagnosis not present

## 2014-09-23 LAB — CBC
HCT: 31 % — ABNORMAL LOW (ref 36.0–46.0)
Hemoglobin: 10.6 g/dL — ABNORMAL LOW (ref 12.0–15.0)
MCH: 30.1 pg (ref 26.0–34.0)
MCHC: 34.2 g/dL (ref 30.0–36.0)
MCV: 88.1 fL (ref 78.0–100.0)
Platelets: 219 10*3/uL (ref 150–400)
RBC: 3.52 MIL/uL — AB (ref 3.87–5.11)
RDW: 12.7 % (ref 11.5–15.5)
WBC: 5.5 10*3/uL (ref 4.0–10.5)

## 2014-09-23 LAB — PROTIME-INR
INR: 1.2 (ref 0.00–1.49)
Prothrombin Time: 15.3 seconds — ABNORMAL HIGH (ref 11.6–15.2)

## 2014-09-23 LAB — GLUCOSE, CAPILLARY
GLUCOSE-CAPILLARY: 170 mg/dL — AB (ref 70–99)
GLUCOSE-CAPILLARY: 150 mg/dL — AB (ref 70–99)
GLUCOSE-CAPILLARY: 177 mg/dL — AB (ref 70–99)
GLUCOSE-CAPILLARY: 74 mg/dL (ref 70–99)
Glucose-Capillary: 115 mg/dL — ABNORMAL HIGH (ref 70–99)

## 2014-09-23 LAB — HEPARIN LEVEL (UNFRACTIONATED): Heparin Unfractionated: >2.2 IU/mL — ABNORMAL HIGH (ref 0.30–0.70)

## 2014-09-23 LAB — APTT: aPTT: 60 seconds — ABNORMAL HIGH (ref 24–37)

## 2014-09-23 MED ORDER — ONDANSETRON HCL 4 MG PO TABS: 4.0000 mg | ORAL_TABLET | Freq: Four times a day (QID) | ORAL | Status: DC | PRN

## 2014-09-23 MED ORDER — ENOXAPARIN SODIUM 100 MG/ML ~~LOC~~ SOLN
90.0000 mg | SUBCUTANEOUS | Status: DC
  Administered 2014-09-23 – 2014-09-24 (×2): 90 mg via SUBCUTANEOUS
  Filled 2014-09-23 (×2): qty 1

## 2014-09-23 MED ORDER — ONDANSETRON HCL 4 MG/2ML IJ SOLN
4.0000 mg | Freq: Four times a day (QID) | INTRAMUSCULAR | Status: DC | PRN
  Administered 2014-09-23: 4 mg via INTRAVENOUS
  Filled 2014-09-23: qty 2

## 2014-09-23 MED ORDER — INSULIN ASPART 100 UNIT/ML ~~LOC~~ SOLN: 0.0000 [IU] | Freq: Every day | SUBCUTANEOUS | Status: DC

## 2014-09-23 MED ORDER — HEPARIN (PORCINE) IN NACL 100-0.45 UNIT/ML-% IJ SOLN
1400.0000 [IU]/h | INTRAMUSCULAR | Status: DC
  Filled 2014-09-23: qty 250

## 2014-09-23 MED ORDER — WARFARIN - PHARMACIST DOSING INPATIENT: Freq: Every day | Status: DC

## 2014-09-23 MED ORDER — POLYETHYLENE GLYCOL 3350 17 G PO PACK
17.0000 g | PACK | Freq: Every day | ORAL | Status: DC
  Administered 2014-09-23 – 2014-09-24 (×2): 17 g via ORAL
  Filled 2014-09-23 (×2): qty 1

## 2014-09-23 MED ORDER — DOCUSATE SODIUM 100 MG PO CAPS
100.0000 mg | ORAL_CAPSULE | Freq: Two times a day (BID) | ORAL | Status: DC
  Administered 2014-09-23 – 2014-09-24 (×3): 100 mg via ORAL
  Filled 2014-09-23 (×6): qty 1

## 2014-09-23 MED ORDER — COUMADIN BOOK
Freq: Once | Status: AC
  Administered 2014-09-23: 08:00:00
  Filled 2014-09-23: qty 1

## 2014-09-23 MED ORDER — HYDROMORPHONE HCL 1 MG/ML IJ SOLN
1.0000 mg | INTRAMUSCULAR | Status: DC | PRN
  Administered 2014-09-23 (×2): 1 mg via INTRAVENOUS
  Filled 2014-09-23 (×2): qty 1

## 2014-09-23 MED ORDER — ROSUVASTATIN CALCIUM 10 MG PO TABS
10.0000 mg | ORAL_TABLET | Freq: Every day | ORAL | Status: DC
  Administered 2014-09-23: 10 mg via ORAL
  Filled 2014-09-23 (×2): qty 1

## 2014-09-23 MED ORDER — DIPHENHYDRAMINE HCL 25 MG PO CAPS
25.0000 mg | ORAL_CAPSULE | Freq: Four times a day (QID) | ORAL | Status: DC | PRN
  Administered 2014-09-23 – 2014-09-24 (×3): 25 mg via ORAL
  Filled 2014-09-23 (×3): qty 1

## 2014-09-23 MED ORDER — WARFARIN SODIUM 7.5 MG PO TABS
7.5000 mg | ORAL_TABLET | ORAL | Status: AC
  Administered 2014-09-23: 7.5 mg via ORAL
  Filled 2014-09-23: qty 1

## 2014-09-23 MED ORDER — INSULIN ASPART 100 UNIT/ML ~~LOC~~ SOLN
0.0000 [IU] | Freq: Three times a day (TID) | SUBCUTANEOUS | Status: DC
  Administered 2014-09-23 (×2): 3 [IU] via SUBCUTANEOUS
  Administered 2014-09-24: 2 [IU] via SUBCUTANEOUS

## 2014-09-23 MED ORDER — WARFARIN VIDEO
Freq: Once | Status: AC
  Administered 2014-09-23: 19:00:00

## 2014-09-23 NOTE — Discharge Instructions (Signed)
Information on my medicine - Coumadin   (Warfarin)  This medication education was reviewed with me or my healthcare representative as part of my discharge preparation.  The pharmacist that spoke with me during my hospital stay was:  Dareen Piano, [facility]  Why was Coumadin prescribed for you? Coumadin was prescribed for you because you have a blood clot or a medical condition that can cause an increased risk of forming blood clots. Blood clots can cause serious health problems by blocking the flow of blood to the heart, lung, or brain. Coumadin can prevent harmful blood clots from forming. As a reminder your indication for Coumadin is:   Pulmonary Embolism Treatment  What test will check on my response to Coumadin? While on Coumadin (warfarin) you will need to have an INR test regularly to ensure that your dose is keeping you in the desired range. The INR (international normalized ratio) number is calculated from the result of the laboratory test called prothrombin time (PT).  If an INR APPOINTMENT HAS NOT ALREADY BEEN MADE FOR YOU please schedule an appointment to have this lab work done by your health care provider within 7 days. Your INR goal is usually a number between:  2 to 3 or your provider may give you a more narrow range like 2-2.5.  Ask your health care provider during an office visit what your goal INR is.  What  do you need to  know  About  COUMADIN? Take Coumadin (warfarin) exactly as prescribed by your healthcare provider about the same time each day.  DO NOT stop taking without talking to the doctor who prescribed the medication.  Stopping without other blood clot prevention medication to take the place of Coumadin may increase your risk of developing a new clot or stroke.  Get refills before you run out.  What do you do if you miss a dose? If you miss a dose, take it as soon as you remember on the same day then continue your regularly scheduled regimen the next day.  Do not take  two doses of Coumadin at the same time.  Important Safety Information A possible side effect of Coumadin (Warfarin) is an increased risk of bleeding. You should call your healthcare provider right away if you experience any of the following: ? Bleeding from an injury or your nose that does not stop. ? Unusual colored urine (red or dark brown) or unusual colored stools (red or black). ? Unusual bruising for unknown reasons. ? A serious fall or if you hit your head (even if there is no bleeding).  Some foods or medicines interact with Coumadin (warfarin) and might alter your response to warfarin. To help avoid this: ? Eat a balanced diet, maintaining a consistent amount of Vitamin K. ? Notify your provider about major diet changes you plan to make. ? Avoid alcohol or limit your intake to 1 drink for women and 2 drinks for men per day. (1 drink is 5 oz. wine, 12 oz. beer, or 1.5 oz. liquor.)  Make sure that ANY health care provider who prescribes medication for you knows that you are taking Coumadin (warfarin).  Also make sure the healthcare provider who is monitoring your Coumadin knows when you have started a new medication including herbals and non-prescription products.  Coumadin (Warfarin)  Major Drug Interactions  Increased Warfarin Effect Decreased Warfarin Effect  Alcohol (large quantities) Antibiotics (esp. Septra/Bactrim, Flagyl, Cipro) Amiodarone (Cordarone) Aspirin (ASA) Cimetidine (Tagamet) Megestrol (Megace) NSAIDs (ibuprofen, naproxen, etc.) Piroxicam (  Feldene) °Propafenone (Rythmol SR) °Propranolol (Inderal) °Isoniazid (INH) °Posaconazole (Noxafil) Barbiturates (Phenobarbital) °Carbamazepine (Tegretol) °Chlordiazepoxide (Librium) °Cholestyramine (Questran) °Griseofulvin °Oral Contraceptives °Rifampin °Sucralfate (Carafate) °Vitamin K  ° °Coumadin® (Warfarin) Major Herbal Interactions  °Increased Warfarin Effect Decreased Warfarin Effect  °Garlic °Ginseng °Ginkgo biloba  Coenzyme Q10 °Green tea °St. John’s wort   ° °Coumadin® (Warfarin) FOOD Interactions  °Eat a consistent number of servings per week of foods HIGH in Vitamin K °(1 serving = ½ cup)  °Collards (cooked, or boiled & drained) °Kale (cooked, or boiled & drained) °Mustard greens (cooked, or boiled & drained) °Parsley *serving size only = ¼ cup °Spinach (cooked, or boiled & drained) °Swiss chard (cooked, or boiled & drained) °Turnip greens (cooked, or boiled & drained)  °Eat a consistent number of servings per week of foods MEDIUM-HIGH in Vitamin K °(1 serving = 1 cup)  °Asparagus (cooked, or boiled & drained) °Broccoli (cooked, boiled & drained, or raw & chopped) °Brussel sprouts (cooked, or boiled & drained) *serving size only = ½ cup °Lettuce, raw (green leaf, endive, romaine) °Spinach, raw °Turnip greens, raw & chopped  ° °These websites have more information on Coumadin (warfarin):  www.coumadin.com; °www.ahrq.gov/consumer/coumadin.htm; ° ° °

## 2014-09-23 NOTE — H&P (Signed)
[facility] History and Physical  Patricia Morgan [id number] DOB: 05-08-1951    PCP:   Kandice Hams, MD   Chief Complaint: sent in from Dr Lina Sar office.   HPI: Patricia Morgan is an 64 y.o. female with hx of breast CA, prior hx of DVT and PE, and was previously on Coumadin, hx of sleep apnea on CPAP. CKD 3, who was started on Eliquis at OSH for recent dx of another DVT on the right leg and PE.  She saw Dr Delfina Redwood in the office, and was sent into the ER.  EDP and patient wasn't clear the exact reason, and Dr Inda Merlin (on call for Dr Delfina Redwood was not sure).  She has pain in her leg and slightly SOB, with no chest pain.  She did not require oxygen supplementation and had normal hemodynamics.  Work up in the ER showed that her Cr was 2.4, and it was felt that Eliquis may not be the best at this dosage, thus hospitalist was asked to admit her for changing her anticoagulation.    Rewiew of Systems:  Constitutional: Negative for malaise, fever and chills. No significant weight loss or weight gain Eyes: Negative for eye pain, redness and discharge, diplopia, visual changes, or flashes of light. ENMT: Negative for ear pain, hoarseness, nasal congestion, sinus pressure and sore throat. No headaches; tinnitus, drooling, or problem swallowing. Cardiovascular: Negative for chest pain, palpitations, diaphoresis,  and peripheral edema. ; No orthopnea, PND Respiratory: Negative for cough, hemoptysis, wheezing and stridor. No pleuritic chestpain. Gastrointestinal: Negative for nausea, vomiting, diarrhea, constipation, abdominal pain, melena, blood in stool, hematemesis, jaundice and rectal bleeding.    Genitourinary: Negative for frequency, dysuria, incontinence,flank pain and hematuria; Musculoskeletal: Negative for back pain and neck pain. Negative for swelling and trauma.;  Skin: . Negative for pruritus, rash, abrasions, bruising and skin lesion.; ulcerations Neuro: Negative for headache,  lightheadedness and neck stiffness. Negative for weakness, altered level of consciousness , altered mental status, extremity weakness, burning feet, involuntary movement, seizure and syncope.  Psych: negative for anxiety, depression, insomnia, tearfulness, panic attacks, hallucinations, paranoia, suicidal or homicidal ideation    Past Medical History  Diagnosis Date  . OBSTRUCTIVE SLEEP APNEA     CPAP  . PULMONARY EMBOLISM 09/24/2008    anticoag thru 03/2010  . Proteinuria   . BREAST CANCER, HX OF 1995    Right Breast  . ANEMIA-NOS   . DIABETES MELLITUS, TYPE II   . HYPERLIPIDEMIA   . HYPERTENSION   . CHRONIC KIDNEY DISEASE STAGE III (MODERATE)     Past Surgical History  Procedure Laterality Date  . Mastectomy  1995    Right  . Insertion port a cath    . Port-a-cath removal      Medications:  HOME MEDS: Prior to Admission medications   Medication Sig Start Date End Date Taking? Authorizing Provider  amLODipine (NORVASC) 10 MG tablet Take 1 tablet (10 mg total) by mouth daily. 04/04/13  Yes Juluis Mire, MD  colchicine 0.6 MG tablet Take 0.6 mg by mouth daily.  07/13/14  Yes Historical Provider, MD  ELIQUIS 5 MG TABS tablet Take 5 mg by mouth 2 (two) times daily.  09/21/14  Yes Historical Provider, MD  lisinopril (PRINIVIL,ZESTRIL) 40 MG tablet Take 1 tablet (40 mg total) by mouth daily. 10/24/13  Yes Juluis Mire, MD  rosuvastatin (CRESTOR) 10 MG tablet Take 1 tablet (10 mg total) by mouth at bedtime. 10/25/13 10/25/14 Yes Marjan Rabbani, MD  albuterol (PROAIR HFA) 108 (  90 BASE) MCG/ACT inhaler Inhale 1-2 puffs into the lungs every 6 (six) hours as needed for wheezing or shortness of breath. Patient not taking: Reported on 09/22/2014 02/07/14   Juluis Mire, MD  diclofenac sodium (VOLTAREN) 1 % GEL Apply 2 g topically 4 (four) times daily. Patient not taking: Reported on 09/22/2014 09/17/13   Hester Mates, MD  furosemide (LASIX) 20 MG tablet Take 1 tablet (20 mg total) by mouth 2  (two) times daily. Patient not taking: Reported on 09/22/2014 11/12/13   Juluis Mire, MD  insulin glargine (LANTUS) 100 UNIT/ML injection Inject 30 units into skin BID as directed Patient not taking: Reported on 09/22/2014 05/02/14   Juluis Mire, MD  insulin lispro (HUMALOG) 100 UNIT/ML injection Inject 10 Units into the skin as needed. Three times per day with meals Patient not taking: Reported on 09/22/2014 04/04/13   Juluis Mire, MD  meloxicam (MOBIC) 7.5 MG tablet Take 1 tablet (7.5 mg total) by mouth daily. Patient not taking: Reported on 09/22/2014 08/13/13   Carlisle Cater, PA-C  neomycin-colistin-hydrocortisone-thonzonium (CORTISPORIN-TC) 3.07-16-08-0.5 MG/ML otic suspension Place 4 drops into the right ear 4 (four) times daily. Patient not taking: Reported on 09/22/2014 09/17/13   Hester Mates, MD  traMADol (ULTRAM) 50 MG tablet Take 1 tablet (50 mg total) by mouth every 6 (six) hours as needed. Patient not taking: Reported on 09/22/2014 08/13/13   Carlisle Cater, PA-C     Allergies:  No Known Allergies  Social History:   reports that she has never smoked. She does not have any smokeless tobacco history on file. She reports that she does not drink alcohol or use illicit drugs.  Family History: Family History  Problem Relation Age of Onset  . Diabetes Sister      Physical Exam: Filed Vitals:   09/22/14 2230 09/22/14 2245 09/22/14 2300 09/22/14 2330  BP: 144/87 145/69 128/72 138/65  Pulse: 85 85 81 81  Temp:      TempSrc:      Resp: 13 18 18 19   Height:      Weight:      SpO2: 98% 95% 98% 97%   Blood pressure 138/65, pulse 81, temperature 98 F (36.7 C), temperature source Oral, resp. rate 19, height 5\' 2"  (1.575 m), weight 93.895 kg (207 lb), SpO2 97 %.  GEN:  Pleasant  patient lying in the stretcher in no acute distress; cooperative with exam. PSYCH:  alert and oriented x4; does not appear anxious or depressed; affect is appropriate. HEENT: Mucous membranes pink and  anicteric; PERRLA; EOM intact; no cervical lymphadenopathy nor thyromegaly or carotid bruit; no JVD; There were no stridor. Neck is very supple. Breasts:: Not examined CHEST WALL: No tenderness CHEST: Normal respiration, clear to auscultation bilaterally.  HEART: Regular rate and rhythm.  There are no murmur, rub, or gallops.   BACK: No kyphosis or scoliosis; no CVA tenderness ABDOMEN: soft and non-tender; no masses, no organomegaly, normal abdominal bowel sounds; no pannus; no intertriginous candida. There is no rebound and no distention. Rectal Exam: Not done EXTREMITIES: No bone or joint deformity; age-appropriate arthropathy of the hands and knees; no edema; no ulcerations.  There is no calf tenderness. Genitalia: not examined PULSES: 2+ and symmetric SKIN: Normal hydration no rash or ulceration CNS: Cranial nerves 2-12 grossly intact no focal lateralizing neurologic deficit.  Speech is fluent; uvula elevated with phonation, facial symmetry and tongue midline. DTR are normal bilaterally, cerebella exam is intact, barbinski is negative and strengths are equaled  bilaterally.  No sensory loss.   Labs on Admission:  Basic Metabolic Panel:  Recent Labs Lab 09/22/14 1918  NA 139  K 4.3  CL 109  CO2 23  GLUCOSE 111*  BUN 33*  CREATININE 2.40*  CALCIUM 9.4   CBC:  Recent Labs Lab 09/22/14 1918  WBC 6.5  HGB 10.4*  HCT 30.9*  MCV 87.8  PLT 220    Radiological Exams on Admission: Dg Chest Portable 1 View  09/22/2014   CLINICAL DATA:  Shortness of breath today. History of pulmonary embolus, breast cancer, diabetes.  EXAM: PORTABLE CHEST - 1 VIEW  COMPARISON:  02/04/2013  FINDINGS: The heart size and mediastinal contours are within normal limits. Both lungs are clear. The visualized skeletal structures are unremarkable. Calcification of the aorta.  IMPRESSION: No active disease.   Electronically Signed   By: Lucienne Capers M.D.   On: 09/22/2014 22:18    EKG: Independently  reviewed. NSR without any acute ST T changes.    Assessment/Plan Present on Admission:  . CKD (chronic kidney disease), stage IV . Acute pulmonary embolism . Benign essential HTN . Obstructive sleep apnea . DVT (deep venous thrombosis)  PLAN:  Will admit her and start IV Heparin without bolus.  Will bridge with Coumadin.  She will need to be on anticoagulation for life, if there is no complication.  Confirm with Dr Delfina Redwood tomorrow as to the reason for recommending admission.   Will continue with her home meds.  D/C Eliquis due to CKD.  For her sleep apnea, will continue with her CPAP.  She is stable, full code, and will be admitted to [facility] service.  Thank you for asking me to participate in her care.   Other plans as per orders.  Code Status: FULL Haskel Khan, MD. [facility] Pager [phone] 7pm to 7am.  09/23/2014, 12:14 AM

## 2014-09-23 NOTE — Progress Notes (Signed)
Dr Rama notified of Lovenox copay of $295. Pt states she is unable to afford this. No other medical assistance programs avail to cover this as pt has insurance.

## 2014-09-23 NOTE — Progress Notes (Signed)
Progress Note   Patricia Morgan [id number] DOB: February 20, 1951 DOA: 09/22/2014 PCP: Kandice Hams, MD   Brief Narrative:   Patricia Morgan is an 64 y.o. female with a PMH of stage III CKD, sleep apnea on C Pap, breast cancer, prior DVT/PE, previously on Coumadin, most recently put on Eliquis for recurrent RLE DVT/PE, sent to the ER for evaluation of SOB and leg pain.  Assessment/Plan:   Principal Problem:   Acute pulmonary embolism / right lower extremity DVT - Started on heparin/Coumadin.  Switched to Lovenox in hopes of D/C home, but cannot afford $295 co-pay. - Will need to remain in hospital until INR therapeutic since she cannot afford Lovenox. - Eliquis D/C'd secondary to CKD.  Active Problems:   Diabetes mellitus - Currently being managed with moderate scale SSI Q AC/HS. - Monitor glycemic control and adjust therapy as needed. - Check hemoglobin A1c.  Says her last check was done in March and was 5.8%. - Says she hasn't taken her Lantus "in months".      Benign essential HTN - Lisinopril on hold secondary to elevated creatinine.    Obstructive sleep apnea - Continue C Pap.    CKD (chronic kidney disease), stage III-IV - Baseline creatinine appears to be 1.9. Current creatinine elevated over usual baseline values. - Hydrate and monitor.    BREAST CANCER, HX OF - Status post mastectomy & chemo.  Dx 1995.  No longer follows with oncologist.   Code Status: Full. Family Communication: No family at the bedside.  Daughter, Ivin Booty, [phone] or [phone] (c), updated by telephone. Disposition Plan: Home when INR therapeutic, likely 5 days.   IV Access:    Peripheral IV   Procedures and diagnostic studies:   Dg Chest Portable 1 View  09/22/2014   CLINICAL DATA:  Shortness of breath today. History of pulmonary embolus, breast cancer, diabetes.  EXAM: PORTABLE CHEST - 1 VIEW  COMPARISON:  02/04/2013  FINDINGS: The heart size and mediastinal contours are  within normal limits. Both lungs are clear. The visualized skeletal structures are unremarkable. Calcification of the aorta.  IMPRESSION: No active disease.   Electronically Signed   By: Lucienne Capers M.D.   On: 09/22/2014 22:18     Medical Consultants:    None.  Anti-Infectives:    None.  Subjective:   [name] continues to report dyspnea and RLE pain.  No N/V.  Appetite OK.  Says she has given herself Lovenox shots in the past.   Objective:    Filed Vitals:   09/23/14 0130 09/23/14 0159 09/23/14 0211 09/23/14 0459  BP: 129/81 145/67  117/61  Pulse: 85 92  91  Temp:  98.6 F (37 C)  99.2 F (37.3 C)  TempSrc:  Oral  Oral  Resp: 19 18  16   Height:   5\' 2"  (1.575 m)   Weight:   93.895 kg (207 lb)   SpO2: 97% 97%  94%   No intake or output data in the 24 hours ending 09/23/14 0800  Exam: Gen:  NAD Cardiovascular:  RRR, No M/R/G Respiratory:  Lungs diminished Gastrointestinal:  Abdomen soft, NT/ND, + BS Extremities:  RLE swollen   Data Reviewed:    Labs: Basic Metabolic Panel:  Recent Labs Lab 09/22/14 1918  NA 139  K 4.3  CL 109  CO2 23  GLUCOSE 111*  BUN 33*  CREATININE 2.40*  CALCIUM 9.4   GFR Estimated Creatinine Clearance: 24.9 mL/min (by C-G formula based on Cr of  2.4).  Coagulation profile  Recent Labs Lab 09/22/14 1918 09/23/14 0553  INR 1.31 1.20   CBC:  Recent Labs Lab 09/22/14 1918 09/23/14 0553  WBC 6.5 5.5  HGB 10.4* 10.6*  HCT 30.9* 31.0*  MCV 87.8 88.1  PLT 220 219   CBG:  Recent Labs Lab 09/23/14 0158  GLUCAP 170*   Microbiology No results found for this or any previous visit (from the past 240 hour(s)).   Medications:   . coumadin book   Does not apply Once  . docusate sodium  100 mg Oral BID  . insulin aspart  0-15 Units Subcutaneous TID WC  . insulin aspart  0-5 Units Subcutaneous QHS  . rosuvastatin  10 mg Oral QHS  . warfarin  7.5 mg Oral NOW  . warfarin   Does not apply Once  .  Warfarin - Pharmacist Dosing Inpatient   Does not apply q1800   Continuous Infusions: . heparin 1,400 Units/hr (09/23/14 0757)    Time spent: 35 minutes with > 50% of time discussing current diagnostic test results, clinical impression and plan of care.       [facility] Pager [phone]. If unable to reach me by pager, please call my cell phone at [phone].  *Please refer to [email or web address], password [email or web address] to get updated schedule on who will round on this patient, as hospitalists switch teams weekly. If 7PM-7AM, please contact night-coverage at [email or web address], password [email or web address] for any overnight needs.  09/23/2014, 8:00 AM

## 2014-09-23 NOTE — Progress Notes (Addendum)
ANTICOAGULATION CONSULT NOTE - Follow up  Pharmacy Consult for Heparin and Coumadin  Indication: new pulmonary embolus/VTE  No Known Allergies  Patient Measurements: Height: 5\' 2"  (157.5 cm) Weight: 207 lb (93.895 kg) IBW/kg (Calculated) : 50.1 Heparin Dosing Weight: 72 kg  Vital Signs: Temp: 99.2 F (37.3 C) (05/10 0459) Temp Source: Oral (05/10 0459) BP: 117/61 mmHg (05/10 0459) Pulse Rate: 91 (05/10 0459)  Labs:  Recent Labs  09/22/14 1918 09/23/14 0553  HGB 10.4* 10.6*  HCT 30.9* 31.0*  PLT 220 219  APTT 41* 60*  LABPROT 16.4* 15.3*  INR 1.31 1.20  HEPARINUNFRC  --  >2.20*  CREATININE 2.40*  --     Estimated Creatinine Clearance: 24.9 mL/min (by C-G formula based on Cr of 2.4).   Medical History: Past Medical History  Diagnosis Date  . OBSTRUCTIVE SLEEP APNEA     CPAP  . PULMONARY EMBOLISM 09/24/2008    anticoag thru 03/2010  . Proteinuria   . BREAST CANCER, HX OF 1995    Right Breast  . ANEMIA-NOS   . DIABETES MELLITUS, TYPE II   . HYPERLIPIDEMIA   . HYPERTENSION   . CHRONIC KIDNEY DISEASE STAGE III (MODERATE)     Medications:  Prescriptions prior to admission  Medication Sig Dispense Refill Last Dose  . amLODipine (NORVASC) 10 MG tablet Take 1 tablet (10 mg total) by mouth daily. 90 tablet 3 09/22/2014 at Unknown time  . colchicine 0.6 MG tablet Take 0.6 mg by mouth daily.   1 09/22/2014 at Unknown time  . ELIQUIS 5 MG TABS tablet Take 5 mg by mouth 2 (two) times daily.    09/22/2014 at 10:30 AM  . lisinopril (PRINIVIL,ZESTRIL) 40 MG tablet Take 1 tablet (40 mg total) by mouth daily. 90 tablet 3 09/22/2014 at Unknown time  . rosuvastatin (CRESTOR) 10 MG tablet Take 1 tablet (10 mg total) by mouth at bedtime. 90 tablet 3 09/22/2014 at Unknown time  . albuterol (PROAIR HFA) 108 (90 BASE) MCG/ACT inhaler Inhale 1-2 puffs into the lungs every 6 (six) hours as needed for wheezing or shortness of breath. (Patient not taking: Reported on 09/22/2014) 1 Inhaler 3 Not  Taking at Unknown time  . diclofenac sodium (VOLTAREN) 1 % GEL Apply 2 g topically 4 (four) times daily. (Patient not taking: Reported on 09/22/2014) 100 g 2 Not Taking at Unknown time  . furosemide (LASIX) 20 MG tablet Take 1 tablet (20 mg total) by mouth 2 (two) times daily. (Patient not taking: Reported on 09/22/2014) 180 tablet 3 Not Taking at Unknown time  . insulin glargine (LANTUS) 100 UNIT/ML injection Inject 30 units into skin BID as directed (Patient not taking: Reported on 09/22/2014) 30 mL 11 Not Taking at Unknown time  . insulin lispro (HUMALOG) 100 UNIT/ML injection Inject 10 Units into the skin as needed. Three times per day with meals (Patient not taking: Reported on 09/22/2014) 30 mL 6 Not Taking at Unknown time  . meloxicam (MOBIC) 7.5 MG tablet Take 1 tablet (7.5 mg total) by mouth daily. (Patient not taking: Reported on 09/22/2014) 14 tablet 0 Not Taking at Unknown time  . neomycin-colistin-hydrocortisone-thonzonium (CORTISPORIN-TC) 3.07-16-08-0.5 MG/ML otic suspension Place 4 drops into the right ear 4 (four) times daily. (Patient not taking: Reported on 09/22/2014) 10 mL 0 Completed Course at Unknown time  . traMADol (ULTRAM) 50 MG tablet Take 1 tablet (50 mg total) by mouth every 6 (six) hours as needed. (Patient not taking: Reported on 09/22/2014) 15 tablet 0 Not  Taking at Unknown time   Scheduled:  Infusions:   Assessment: 64yo female with history of PE, CKD3, and breast CA presents with SOB. Pt was diagnosed with PE/DVT in [place] over the weekend and sent out on Eliquis prior to driving back to [place]. Pharmacy was consulted to dose heparin for PE. Pt last took Eliquis at 1030 AM on 09/22/14   09/23/14 AM:  PTT = 60 sec, Heparin level = >2.2 on IV heparin drip rate 1200 units/hr.  She has a hx of breast CA, prior hx of DVT and PE, and has previously been on Coumadin. However she was started on Eliquis this past weekend at outside hospital for recent dx of another DVT on the right leg and  PE.Dr. Marin Comment noted that she will need to be on anticoagulation for life, if there is no complication and he ordered pharmacy to start coumadin, continue bridge with heparin.  INR = 1.2 this AM.   Warfarin point score = 6  Goal of Therapy:  PTT = 66-102 sec Heparin level 0.3-0.7 units/ml Monitor platelets by anticoagulation protocol: Yes   Plan:  Increase heparin drip to 1400 units/hr F/u aPTT, heparin level in 6hrs  Coumadin 7.5 mg this AM x1,  No coumadin tonight Daily aPTT, HL, CBC, PT/INR    Nicole Cella, RPh Clinical Pharmacist Pager: [phone] 09/23/2014,7:19 AM

## 2014-09-23 NOTE — ED Notes (Signed)
Report to [facility], pt care transferred

## 2014-09-23 NOTE — Progress Notes (Signed)
ANTICOAGULATION CONSULT NOTE - Follow up  Pharmacy Consult for Heparin and Coumadin  Indication: new pulmonary embolus/VTE  No Known Allergies  Patient Measurements: Height: 5\' 2"  (157.5 cm) Weight: 207 lb (93.895 kg) IBW/kg (Calculated) : 50.1 Heparin Dosing Weight: 72 kg  Vital Signs: Temp: 99.2 F (37.3 C) (05/10 0459) Temp Source: Oral (05/10 0459) BP: 117/61 mmHg (05/10 0459) Pulse Rate: 91 (05/10 0459)  Labs:  Recent Labs  09/22/14 1918 09/23/14 0553  HGB 10.4* 10.6*  HCT 30.9* 31.0*  PLT 220 219  APTT 41* 60*  LABPROT 16.4* 15.3*  INR 1.31 1.20  HEPARINUNFRC  --  >2.20*  CREATININE 2.40*  --     Estimated Creatinine Clearance: 24.9 mL/min (by C-G formula based on Cr of 2.4).  Assessment: 64yo female with history of PE, CKD3, and breast CA presents with SOB. Pt was diagnosed with PE/DVT in [place] over the weekend and sent out on Eliquis prior to driving back to [place]. She was started on IV heparin bridge to coumadin. Now to switch heparin to lovenox. INR 1.2 this morning. Last eliquis dose > 24 hrs ago. Hgb 10.6, plt 219K, stable, scr 2.4, est. crcl ~ 20-25 ml/min. Will likely needs lifelong anticoagulation  Warfarin point score = 6  Goal of Therapy:  INR 2-3 Monitor platelets by anticoagulation protocol: Yes   Plan:  D/C IV heparin Start lovenox 90 mg sq Q 24 hrs one hr after stopping heparin. Coumadin 7.5 mg this AM x1,  No coumadin tonight Daily aPTT, HL, CBC, PT/INR  Coumadin education with pharmacist  Maryanna Shape, PharmD, BCPS  Clinical Pharmacist  Pager: [phone]  09/23/2014,10:31 AM

## 2014-09-24 DIAGNOSIS — Z7901 Long term (current) use of anticoagulants: Secondary | ICD-10-CM | POA: Diagnosis not present

## 2014-09-24 DIAGNOSIS — I82409 Acute embolism and thrombosis of unspecified deep veins of unspecified lower extremity: Secondary | ICD-10-CM | POA: Diagnosis not present

## 2014-09-24 DIAGNOSIS — G4733 Obstructive sleep apnea (adult) (pediatric): Secondary | ICD-10-CM | POA: Diagnosis not present

## 2014-09-24 DIAGNOSIS — I2699 Other pulmonary embolism without acute cor pulmonale: Secondary | ICD-10-CM | POA: Diagnosis not present

## 2014-09-24 DIAGNOSIS — Z853 Personal history of malignant neoplasm of breast: Secondary | ICD-10-CM | POA: Diagnosis not present

## 2014-09-24 DIAGNOSIS — N184 Chronic kidney disease, stage 4 (severe): Secondary | ICD-10-CM | POA: Diagnosis not present

## 2014-09-24 DIAGNOSIS — E119 Type 2 diabetes mellitus without complications: Secondary | ICD-10-CM | POA: Diagnosis not present

## 2014-09-24 DIAGNOSIS — I129 Hypertensive chronic kidney disease with stage 1 through stage 4 chronic kidney disease, or unspecified chronic kidney disease: Secondary | ICD-10-CM | POA: Diagnosis not present

## 2014-09-24 DIAGNOSIS — I82401 Acute embolism and thrombosis of unspecified deep veins of right lower extremity: Secondary | ICD-10-CM | POA: Diagnosis not present

## 2014-09-24 DIAGNOSIS — E785 Hyperlipidemia, unspecified: Secondary | ICD-10-CM | POA: Diagnosis not present

## 2014-09-24 LAB — HEPATIC FUNCTION PANEL
ALK PHOS: 74 U/L (ref 38–126)
ALT: 15 U/L (ref 14–54)
AST: 20 U/L (ref 15–41)
Albumin: 3 g/dL — ABNORMAL LOW (ref 3.5–5.0)
BILIRUBIN DIRECT: 0.1 mg/dL (ref 0.1–0.5)
BILIRUBIN INDIRECT: 0.5 mg/dL (ref 0.3–0.9)
BILIRUBIN TOTAL: 0.6 mg/dL (ref 0.3–1.2)
Total Protein: 6.8 g/dL (ref 6.5–8.1)

## 2014-09-24 LAB — PROTIME-INR
INR: 1.14 (ref 0.00–1.49)
Prothrombin Time: 14.7 seconds (ref 11.6–15.2)

## 2014-09-24 LAB — GLUCOSE, CAPILLARY
Glucose-Capillary: 130 mg/dL — ABNORMAL HIGH (ref 70–99)
Glucose-Capillary: 109 mg/dL — ABNORMAL HIGH (ref 70–99)
Glucose-Capillary: 115 mg/dL — ABNORMAL HIGH (ref 70–99)

## 2014-09-24 LAB — HEMOGLOBIN A1C
HEMOGLOBIN A1C: 6.4 % — AB (ref 4.8–5.6)
Mean Plasma Glucose: 137 mg/dL

## 2014-09-24 LAB — CBC
HEMATOCRIT: 29.6 % — AB (ref 36.0–46.0)
Hemoglobin: 9.8 g/dL — ABNORMAL LOW (ref 12.0–15.0)
MCH: 29.4 pg (ref 26.0–34.0)
MCHC: 33.1 g/dL (ref 30.0–36.0)
MCV: 88.9 fL (ref 78.0–100.0)
Platelets: 240 10*3/uL (ref 150–400)
RBC: 3.33 MIL/uL — ABNORMAL LOW (ref 3.87–5.11)
RDW: 12.5 % (ref 11.5–15.5)
WBC: 4.8 10*3/uL (ref 4.0–10.5)

## 2014-09-24 MED ORDER — ENOXAPARIN SODIUM 150 MG/ML ~~LOC~~ SOLN: 1.0000 mg/kg | SUBCUTANEOUS | Status: DC

## 2014-09-24 MED ORDER — WARFARIN SODIUM 10 MG PO TABS
10.0000 mg | ORAL_TABLET | Freq: Once | ORAL | Status: AC
  Administered 2014-09-24: 10 mg via ORAL
  Filled 2014-09-24 (×2): qty 1

## 2014-09-24 MED ORDER — WARFARIN SODIUM 10 MG PO TABS: 10.0000 mg | ORAL_TABLET | Freq: Every day | ORAL | Status: DC

## 2014-09-24 MED ORDER — ACETAMINOPHEN 500 MG PO TABS
500.0000 mg | ORAL_TABLET | Freq: Four times a day (QID) | ORAL | Status: DC | PRN
Start: 2014-09-24 — End: 2014-09-24
  Administered 2014-09-24: 500 mg via ORAL
  Filled 2014-09-24: qty 1

## 2014-09-24 NOTE — Progress Notes (Signed)
NURSING PROGRESS NOTE  Patricia Morgan [id number] Discharge Data: 09/24/2014 7:17 PM Attending Provider: No att. providers found [id number] D, MD     [place] to be D/C'd home per MD order.  Discussed with the patient the After Visit Summary and all questions fully answered. All IV's discontinued with no bleeding noted. All belongings returned to patient for patient to take home.   Last Vital Signs:  Blood pressure 132/64, pulse 84, temperature 99 F (37.2 C), temperature source Oral, resp. rate 18, height 5\' 2"  (1.575 m), weight 93.895 kg (207 lb), SpO2 98 %.  Discharge Medication List   Medication List    STOP taking these medications        ELIQUIS 5 MG Tabs tablet  Generic drug:  apixaban     meloxicam 7.5 MG tablet  Commonly known as:  MOBIC      TAKE these medications        albuterol 108 (90 BASE) MCG/ACT inhaler  Commonly known as:  PROAIR HFA  Inhale 1-2 puffs into the lungs every 6 (six) hours as needed for wheezing or shortness of breath.     amLODipine 10 MG tablet  Commonly known as:  NORVASC  Take 1 tablet (10 mg total) by mouth daily.     colchicine 0.6 MG tablet  Take 0.6 mg by mouth daily.     diclofenac sodium 1 % Gel  Commonly known as:  VOLTAREN  Apply 2 g topically 4 (four) times daily.     enoxaparin 150 MG/ML injection  Commonly known as:  LOVENOX  Inject 0.63 mLs (95 mg total) into the skin daily.     furosemide 20 MG tablet  Commonly known as:  LASIX  Take 1 tablet (20 mg total) by mouth 2 (two) times daily.     insulin glargine 100 UNIT/ML injection  Commonly known as:  LANTUS  Inject 30 units into skin BID as directed     insulin lispro 100 UNIT/ML injection  Commonly known as:  HUMALOG  Inject 10 Units into the skin as needed. Three times per day with meals     lisinopril 40 MG tablet  Commonly known as:  PRINIVIL,ZESTRIL  Take 1 tablet (40 mg total) by mouth daily.     neomycin-colistin-hydrocortisone-thonzonium  3.07-16-08-0.5 MG/ML otic suspension  Commonly known as:  CORTISPORIN-TC  Place 4 drops into the right ear 4 (four) times daily.     rosuvastatin 10 MG tablet  Commonly known as:  CRESTOR  Take 1 tablet (10 mg total) by mouth at bedtime.     traMADol 50 MG tablet  Commonly known as:  ULTRAM  Take 1 tablet (50 mg total) by mouth every 6 (six) hours as needed.     warfarin 10 MG tablet  Commonly known as:  COUMADIN  Take 1 tablet (10 mg total) by mouth daily at 6 PM.

## 2014-09-24 NOTE — Progress Notes (Signed)
ANTICOAGULATION CONSULT NOTE - Follow up  Pharmacy Consult for Heparin and Coumadin  Indication: new pulmonary embolus/VTE  No Known Allergies  Patient Measurements: Height: 5\' 2"  (157.5 cm) Weight: 207 lb (93.895 kg) IBW/kg (Calculated) : 50.1 Heparin Dosing Weight: 72 kg  Vital Signs: Temp: 100 F (37.8 C) (05/11 0558) Temp Source: Oral (05/11 0558) BP: 119/47 mmHg (05/11 0558) Pulse Rate: 81 (05/11 0558)  Labs:  Recent Labs  09/22/14 1918 09/23/14 0553 09/24/14 0515  HGB 10.4* 10.6* 9.8*  HCT 30.9* 31.0* 29.6*  PLT 220 219 240  APTT 41* 60*  --   LABPROT 16.4* 15.3* 14.7  INR 1.31 1.20 1.14  HEPARINUNFRC  --  >2.20*  --   CREATININE 2.40*  --   --     Estimated Creatinine Clearance: 24.9 mL/min (by C-G formula based on Cr of 2.4).  Assessment: 64yo female with history of PE, CKD3, and breast CA presents with SOB. Pt was diagnosed with PE/DVT in [place] over the weekend and sent out on Eliquis prior to driving back to [place]. She is now on D#2 Lovenox bridge to coumadin. INR 1.14 after one dose of coumadin 7.5 mg. Hgb 9.8, plt 240K, stable, scr 2.4, est. crcl ~ 20-25 ml/min. Will likely needs lifelong anticoagulation.   Warfarin point score = 6  Pt. Has high copay for lovenox, renal function is borderline for Eliquis (pts with a serum creatinine >2.5 mg/dL or CrCl <25 mL/minute were excluded from the clinical trials). Her renal function is ok for savaysa 30 mg daily, however, savasay requires to complete 5 to 10 days of initial therapy with a parenteral anticoagulant. Will continue with lovenox/coumadin.  Goal of Therapy:  INR 2-3 Monitor platelets by anticoagulation protocol: Yes   Plan:  Start lovenox 90 mg sq Q 24 hrs one hr after stopping heparin. Coumadin 10 mg this AM x1 Daily aPTT, HL, CBC, PT/INR  Change CBC to Q 72 hrs  Maryanna Shape, PharmD, BCPS  Clinical Pharmacist  Pager: [phone]  09/24/2014,9:53 AM

## 2014-09-24 NOTE — Discharge Summary (Signed)
Discharge Summary  Patricia Morgan [id number] DOB: 12-29-1950  PCP: Kandice Hams, MD  Admit date: 09/22/2014 Discharge date: 09/24/2014  Time spent: <44mins  Recommendations for Outpatient Follow-up:  1. F/u with PMD on Friday 5/13 and MOnday 5/16 for INT check and coumadin dosing, pmd to repeat cbc/bmp, monitor renal function  Discharge Diagnoses:  Active Hospital Problems   Diagnosis Date Noted  . Acute pulmonary embolism 09/24/2008  . DVT (deep venous thrombosis) 09/23/2014  . Pulmonary embolism   . CKD (chronic kidney disease), stage IV 06/26/2009  . Obstructive sleep apnea 10/14/2008  . Diabetes mellitus 09/29/2008  . Benign essential HTN 09/26/2008  . BREAST CANCER, HX OF 09/26/2008    Resolved Hospital Problems   Diagnosis Date Noted Date Resolved  No resolved problems to display.    Discharge Condition: stable  Diet recommendation: heart healthy/carb modified  Filed Weights   09/22/14 1903 09/23/14 0211  Weight: 93.895 kg (207 lb) 93.895 kg (207 lb)    History of present illness:  Patricia Morgan is an 64 y.o. female with hx of breast CA, prior hx of DVT and PE, and was previously on Coumadin, hx of sleep apnea on CPAP. CKD 3, who was started on Eliquis at OSH for recent dx of another DVT on the right leg and PE. She saw Dr Delfina Redwood in the office, and was sent into the ER. EDP and patient wasn't clear the exact reason, and Dr Inda Merlin (on call for Dr Delfina Redwood was not sure). She has pain in her leg and slightly SOB, with no chest pain. She did not require oxygen supplementation and had normal hemodynamics. Work up in the ER showed that her Cr was 2.4, and it was felt that Eliquis may not be the best at this dosage, thus hospitalist was asked to admit her for changing her anticoagulation.  Hospital Course:  Principal Problem:   Acute pulmonary embolism Active Problems:   Diabetes mellitus   Benign essential HTN   Obstructive sleep apnea   CKD (chronic kidney  disease), stage IV   BREAST CANCER, HX OF   DVT (deep venous thrombosis)   Pulmonary embolism  Recurrent DVT, h/o PE , will need life long anticoagulation, not a candidate for NOAC due to CKD, coumadin with lovenox bridge, care manger to assist meds and facilitate pmd follow up , INT monitor. D/c eliquis.  CkdIII, d/c meloxicam. pmd to continue monitor renal function, renal dosing meds.  IDDM2, continue home insulin. a1c 6.4.  Htn: well controlled on home meds, continue. And pmd follow up.   Procedures:  none  Consultations:  none  Discharge Exam: BP 132/64 mmHg  Pulse 84  Temp(Src) 99 F (37.2 C) (Oral)  Resp 18  Ht 5\' 2"  (1.575 m)  Wt 93.895 kg (207 lb)  BMI 37.85 kg/m2  SpO2 98%  General: aaox3, nad Cardiovascular: RRR,  Respiratory: CTABL  Discharge Instructions You were cared for by a hospitalist during your hospital stay. If you have any questions about your discharge medications or the care you received while you were in the hospital after you are discharged, you can call the unit and asked to speak with the hospitalist on call if the hospitalist that took care of you is not available. Once you are discharged, your primary care physician will handle any further medical issues. Please note that NO REFILLS for any discharge medications will be authorized once you are discharged, as it is imperative that you return to your primary care physician (or establish  a relationship with a primary care physician if you do not have one) for your aftercare needs so that they can reassess your need for medications and monitor your lab values.  Discharge Instructions    Diet - low sodium heart healthy    Complete by:  As directed      Increase activity slowly    Complete by:  As directed             Medication List    STOP taking these medications        ELIQUIS 5 MG Tabs tablet  Generic drug:  apixaban     meloxicam 7.5 MG tablet  Commonly known as:  MOBIC        TAKE these medications        albuterol 108 (90 BASE) MCG/ACT inhaler  Commonly known as:  PROAIR HFA  Inhale 1-2 puffs into the lungs every 6 (six) hours as needed for wheezing or shortness of breath.     amLODipine 10 MG tablet  Commonly known as:  NORVASC  Take 1 tablet (10 mg total) by mouth daily.     colchicine 0.6 MG tablet  Take 0.6 mg by mouth daily.     diclofenac sodium 1 % Gel  Commonly known as:  VOLTAREN  Apply 2 g topically 4 (four) times daily.     enoxaparin 150 MG/ML injection  Commonly known as:  LOVENOX  Inject 0.63 mLs (95 mg total) into the skin daily.     furosemide 20 MG tablet  Commonly known as:  LASIX  Take 1 tablet (20 mg total) by mouth 2 (two) times daily.     insulin glargine 100 UNIT/ML injection  Commonly known as:  LANTUS  Inject 30 units into skin BID as directed     insulin lispro 100 UNIT/ML injection  Commonly known as:  HUMALOG  Inject 10 Units into the skin as needed. Three times per day with meals     lisinopril 40 MG tablet  Commonly known as:  PRINIVIL,ZESTRIL  Take 1 tablet (40 mg total) by mouth daily.     neomycin-colistin-hydrocortisone-thonzonium 3.07-16-08-0.5 MG/ML otic suspension  Commonly known as:  CORTISPORIN-TC  Place 4 drops into the right ear 4 (four) times daily.     rosuvastatin 10 MG tablet  Commonly known as:  CRESTOR  Take 1 tablet (10 mg total) by mouth at bedtime.     traMADol 50 MG tablet  Commonly known as:  ULTRAM  Take 1 tablet (50 mg total) by mouth every 6 (six) hours as needed.     warfarin 10 MG tablet  Commonly known as:  COUMADIN  Take 1 tablet (10 mg total) by mouth daily at 6 PM.       No Known Allergies     Follow-up Information    Follow up with Dr Delfina Redwood.   Why:  Go to office to get INR checked Friday 09-26-14 and Monday 09-29-14. You will need to call and schedule this      Follow up with outpatient pharmacy.   [id number]. To call Outpatient Pharmacy   Contact  information:   [place] have your Lovenox. YOU WILL NEED TO GET THIS TONIGHT BY 6PM OR EARLY THURSDAY MORNING TO GIVE YOURSELF YOUR MORNING DOSE BY 10:00       The results of significant diagnostics from this hospitalization (including imaging, microbiology, ancillary and laboratory) are listed below for reference.    Significant  Diagnostic Studies: Dg Chest Portable 1 View  09/22/2014   CLINICAL DATA:  Shortness of breath today. History of pulmonary embolus, breast cancer, diabetes.  EXAM: PORTABLE CHEST - 1 VIEW  COMPARISON:  02/04/2013  FINDINGS: The heart size and mediastinal contours are within normal limits. Both lungs are clear. The visualized skeletal structures are unremarkable. Calcification of the aorta.  IMPRESSION: No active disease.   Electronically Signed   By: Lucienne Capers M.D.   On: 09/22/2014 22:18    Microbiology: No results found for this or any previous visit (from the past 240 hour(s)).   Labs: Basic Metabolic Panel:  Recent Labs Lab 09/22/14 1918  NA 139  K 4.3  CL 109  CO2 23  GLUCOSE 111*  BUN 33*  CREATININE 2.40*  CALCIUM 9.4   Liver Function Tests:  Recent Labs Lab 09/24/14 0908  AST 20  ALT 15  ALKPHOS 74  BILITOT 0.6  PROT 6.8  ALBUMIN 3.0*   No results for input(s): LIPASE, AMYLASE in the last 168 hours. No results for input(s): AMMONIA in the last 168 hours. CBC:  Recent Labs Lab 09/22/14 1918 09/23/14 0553 09/24/14 0515  WBC 6.5 5.5 4.8  HGB 10.4* 10.6* 9.8*  HCT 30.9* 31.0* 29.6*  MCV 87.8 88.1 88.9  PLT 220 219 240   Cardiac Enzymes: No results for input(s): CKTOTAL, CKMB, CKMBINDEX, TROPONINI in the last 168 hours. BNP: BNP (last 3 results) No results for input(s): BNP in the last 8760 hours.  ProBNP (last 3 results) No results for input(s): PROBNP in the last 8760 hours.  CBG:  Recent Labs Lab 09/23/14 1715 09/23/14 2153 09/24/14 0807 09/24/14 1237 09/24/14 1721  GLUCAP 74  115* 130* 109* 115*       Signed:  Lynwood Kubisiak MD, PhD  [facility] 09/24/2014, 6:57 PM

## 2014-09-25 ENCOUNTER — Other Ambulatory Visit: Payer: Self-pay

## 2014-09-25 NOTE — Patient Outreach (Signed)
Referral received in length of stay meeting. HIPPA verified. Spoke with the patient about [facility] [employer] services as a benefit of her [employer] insurance. The follow up call with the patient from a referral regarding the patient had difficulty affording her Lovenox post discharge. She states initially 5 injections were going to cost her $2700.00 out of pocket. She confirms that she has received the assistance for 5 injections of Lovenox.  She is currently having difficulty in getting her follow up appointment for her lab work needed for tomorrow.  As she had left a message for a call from her MD office. Call placed to [facility] [phone]) to Dr. Seward Carol. Spoke with the receptionist and was able to assist in getting the patient an appointment for tomorrow for hospital follow up and lab on 09/26/14 @ 2:45 pm.  She states that the appointment for Monday for labs will be set up after her appointment tomorrow. Called placed back to the patient and follow up appointment information was given.  Patient expressed appreciation for the assistance. Patient states she will have her MD office review her Lovenox injections again with her.  She was able to give herself the injection this morning but would like someone to review the dosage again with her.  Patient did not feel she needed home visits.  Verbal consent for follow up calls received. Will request for [facility] to perform transition of care calls. Natividad Brood, RN BSN [facility] Liaison  [phone] business mobile phone

## 2014-09-26 DIAGNOSIS — I82401 Acute embolism and thrombosis of unspecified deep veins of right lower extremity: Secondary | ICD-10-CM | POA: Diagnosis not present

## 2014-09-26 DIAGNOSIS — Z7901 Long term (current) use of anticoagulants: Secondary | ICD-10-CM | POA: Diagnosis not present

## 2014-09-26 DIAGNOSIS — I2699 Other pulmonary embolism without acute cor pulmonale: Secondary | ICD-10-CM | POA: Diagnosis not present

## 2014-09-26 NOTE — Consult Note (Signed)
   [facility] CM Inpatient Consult   09/26/2014  Patricia Morgan 04/01/1951 [id number] Received a call from the patient, HIPPA verified,  stating she needed a review of her Lovenox dosage and she was unable to speak to the Outpatient Pharmacy with her questions. She verbalized that the pharmacist that gave her the medication instructed her, "to call them back if she had questions."  This writer encouraged her to continue to contact the pharmacy for her questions.  Verified that she had the correct pharmacy contact information.  She verbalized that she will continue to attempt to contact the [place] location.  Patient thanked [employer] information officer for assistance.  She confirm that she has an appointment today with her primary care physician.   Natividad Brood, RN BSN [facility] Liaison  [phone] business mobile phone

## 2014-09-29 DIAGNOSIS — Z7901 Long term (current) use of anticoagulants: Secondary | ICD-10-CM | POA: Diagnosis not present

## 2014-10-07 DIAGNOSIS — Z7901 Long term (current) use of anticoagulants: Secondary | ICD-10-CM | POA: Diagnosis not present

## 2014-10-14 DIAGNOSIS — Z7901 Long term (current) use of anticoagulants: Secondary | ICD-10-CM | POA: Diagnosis not present

## 2014-10-22 DIAGNOSIS — Z7901 Long term (current) use of anticoagulants: Secondary | ICD-10-CM | POA: Diagnosis not present

## 2014-11-18 DIAGNOSIS — Z7901 Long term (current) use of anticoagulants: Secondary | ICD-10-CM | POA: Diagnosis not present

## 2014-12-16 DIAGNOSIS — Z7901 Long term (current) use of anticoagulants: Secondary | ICD-10-CM | POA: Diagnosis not present

## 2015-02-03 DIAGNOSIS — E1122 Type 2 diabetes mellitus with diabetic chronic kidney disease: Secondary | ICD-10-CM | POA: Diagnosis not present

## 2015-02-03 DIAGNOSIS — C50911 Malignant neoplasm of unspecified site of right female breast: Secondary | ICD-10-CM | POA: Diagnosis not present

## 2015-02-03 DIAGNOSIS — Z7901 Long term (current) use of anticoagulants: Secondary | ICD-10-CM | POA: Diagnosis not present

## 2015-02-03 DIAGNOSIS — E663 Overweight: Secondary | ICD-10-CM | POA: Diagnosis not present

## 2015-02-03 DIAGNOSIS — N183 Chronic kidney disease, stage 3 (moderate): Secondary | ICD-10-CM | POA: Diagnosis not present

## 2015-02-03 DIAGNOSIS — E780 Pure hypercholesterolemia: Secondary | ICD-10-CM | POA: Diagnosis not present

## 2015-02-03 DIAGNOSIS — G4730 Sleep apnea, unspecified: Secondary | ICD-10-CM | POA: Diagnosis not present

## 2015-02-03 DIAGNOSIS — I10 Essential (primary) hypertension: Secondary | ICD-10-CM | POA: Diagnosis not present

## 2015-02-10 DIAGNOSIS — E119 Type 2 diabetes mellitus without complications: Secondary | ICD-10-CM | POA: Diagnosis not present

## 2015-02-10 DIAGNOSIS — I10 Essential (primary) hypertension: Secondary | ICD-10-CM | POA: Diagnosis not present

## 2015-02-10 DIAGNOSIS — N183 Chronic kidney disease, stage 3 (moderate): Secondary | ICD-10-CM | POA: Diagnosis not present

## 2015-02-10 DIAGNOSIS — M109 Gout, unspecified: Secondary | ICD-10-CM | POA: Diagnosis not present

## 2015-03-06 DIAGNOSIS — Z7901 Long term (current) use of anticoagulants: Secondary | ICD-10-CM | POA: Diagnosis not present

## 2015-04-13 DIAGNOSIS — Z7901 Long term (current) use of anticoagulants: Secondary | ICD-10-CM | POA: Diagnosis not present

## 2015-05-12 DIAGNOSIS — Z7901 Long term (current) use of anticoagulants: Secondary | ICD-10-CM | POA: Diagnosis not present

## 2015-05-20 DIAGNOSIS — Z7901 Long term (current) use of anticoagulants: Secondary | ICD-10-CM | POA: Diagnosis not present

## 2015-05-21 DIAGNOSIS — E119 Type 2 diabetes mellitus without complications: Secondary | ICD-10-CM | POA: Diagnosis not present

## 2015-05-21 DIAGNOSIS — H35033 Hypertensive retinopathy, bilateral: Secondary | ICD-10-CM | POA: Diagnosis not present

## 2015-05-21 DIAGNOSIS — H524 Presbyopia: Secondary | ICD-10-CM | POA: Diagnosis not present

## 2015-05-21 DIAGNOSIS — H2513 Age-related nuclear cataract, bilateral: Secondary | ICD-10-CM | POA: Diagnosis not present

## 2015-05-26 ENCOUNTER — Telehealth: Payer: Self-pay | Admitting: Pulmonary Disease

## 2015-05-26 NOTE — Telephone Encounter (Signed)
LMTCB x1 w/ family member

## 2015-05-27 DIAGNOSIS — M109 Gout, unspecified: Secondary | ICD-10-CM | POA: Diagnosis not present

## 2015-05-27 DIAGNOSIS — N183 Chronic kidney disease, stage 3 (moderate): Secondary | ICD-10-CM | POA: Diagnosis not present

## 2015-05-27 DIAGNOSIS — I10 Essential (primary) hypertension: Secondary | ICD-10-CM | POA: Diagnosis not present

## 2015-05-27 DIAGNOSIS — E119 Type 2 diabetes mellitus without complications: Secondary | ICD-10-CM | POA: Diagnosis not present

## 2015-05-27 NOTE — Telephone Encounter (Signed)
Spoke with the pt  She states that Dr Delfina Redwood needs copy of last PSG  I have faxed this to Dr Delfina Redwood at [phone]  Nothing further needed per pt

## 2015-05-27 NOTE — Telephone Encounter (Signed)
Patient returned call, CB is [phone] or [phone] (cell phone).

## 2015-06-11 ENCOUNTER — Telehealth: Payer: Self-pay | Admitting: Pulmonary Disease

## 2015-06-11 NOTE — Telephone Encounter (Signed)
Spoke with pt. States that Dr. Lina Sar office did not receive the records we sent over on 05/27/15. These will need to be refaxed to [facility] at [phone]. Records have been resent. Nothing further was needed.

## 2015-06-12 DIAGNOSIS — G4730 Sleep apnea, unspecified: Secondary | ICD-10-CM | POA: Diagnosis not present

## 2015-06-12 DIAGNOSIS — Z23 Encounter for immunization: Secondary | ICD-10-CM | POA: Diagnosis not present

## 2015-06-22 DIAGNOSIS — G4733 Obstructive sleep apnea (adult) (pediatric): Secondary | ICD-10-CM | POA: Diagnosis not present

## 2015-06-24 DIAGNOSIS — Z7901 Long term (current) use of anticoagulants: Secondary | ICD-10-CM | POA: Diagnosis not present

## 2015-07-06 ENCOUNTER — Other Ambulatory Visit: Payer: Self-pay

## 2015-07-06 DIAGNOSIS — Z1231 Encounter for screening mammogram for malignant neoplasm of breast: Secondary | ICD-10-CM

## 2015-07-16 ENCOUNTER — Ambulatory Visit: Payer: Commercial Managed Care - HMO

## 2015-07-20 DIAGNOSIS — G4733 Obstructive sleep apnea (adult) (pediatric): Secondary | ICD-10-CM | POA: Diagnosis not present

## 2015-07-22 DIAGNOSIS — Z7901 Long term (current) use of anticoagulants: Secondary | ICD-10-CM | POA: Diagnosis not present

## 2015-07-27 ENCOUNTER — Ambulatory Visit
Admission: RE | Admit: 2015-07-27 | Discharge: 2015-07-27 | Disposition: A | Discharge status: Home / Self Care | Payer: Commercial Managed Care - HMO | Source: Ambulatory Visit

## 2015-07-27 ENCOUNTER — Other Ambulatory Visit: Payer: Self-pay

## 2015-07-27 DIAGNOSIS — Z1231 Encounter for screening mammogram for malignant neoplasm of breast: Secondary | ICD-10-CM

## 2015-08-11 DIAGNOSIS — I10 Essential (primary) hypertension: Secondary | ICD-10-CM | POA: Diagnosis not present

## 2015-08-11 DIAGNOSIS — Z1389 Encounter for screening for other disorder: Secondary | ICD-10-CM | POA: Diagnosis not present

## 2015-08-11 DIAGNOSIS — I82409 Acute embolism and thrombosis of unspecified deep veins of unspecified lower extremity: Secondary | ICD-10-CM | POA: Diagnosis not present

## 2015-08-11 DIAGNOSIS — Z0000 Encounter for general adult medical examination without abnormal findings: Secondary | ICD-10-CM | POA: Diagnosis not present

## 2015-08-11 DIAGNOSIS — E1122 Type 2 diabetes mellitus with diabetic chronic kidney disease: Secondary | ICD-10-CM | POA: Diagnosis not present

## 2015-08-11 DIAGNOSIS — Z794 Long term (current) use of insulin: Secondary | ICD-10-CM | POA: Diagnosis not present

## 2015-08-11 DIAGNOSIS — G4730 Sleep apnea, unspecified: Secondary | ICD-10-CM | POA: Diagnosis not present

## 2015-08-11 DIAGNOSIS — N183 Chronic kidney disease, stage 3 (moderate): Secondary | ICD-10-CM | POA: Diagnosis not present

## 2015-08-20 DIAGNOSIS — G4733 Obstructive sleep apnea (adult) (pediatric): Secondary | ICD-10-CM | POA: Diagnosis not present

## 2015-08-21 DIAGNOSIS — Z7901 Long term (current) use of anticoagulants: Secondary | ICD-10-CM | POA: Diagnosis not present

## 2015-08-30 ENCOUNTER — Observation Stay (HOSPITAL_COMMUNITY): Payer: Commercial Managed Care - HMO

## 2015-08-30 ENCOUNTER — Encounter (HOSPITAL_BASED_OUTPATIENT_CLINIC_OR_DEPARTMENT_OTHER): Payer: Self-pay | Admitting: Emergency Medicine

## 2015-08-30 ENCOUNTER — Emergency Department (HOSPITAL_BASED_OUTPATIENT_CLINIC_OR_DEPARTMENT_OTHER): Payer: Commercial Managed Care - HMO

## 2015-08-30 ENCOUNTER — Observation Stay (HOSPITAL_BASED_OUTPATIENT_CLINIC_OR_DEPARTMENT_OTHER)
Admission: EM | Admit: 2015-08-30 | Discharge: 2015-09-01 | Disposition: A | Discharge status: Home / Self Care | Payer: Commercial Managed Care - HMO | Attending: Family Medicine | Admitting: Family Medicine

## 2015-08-30 DIAGNOSIS — E785 Hyperlipidemia, unspecified: Secondary | ICD-10-CM | POA: Insufficient documentation

## 2015-08-30 DIAGNOSIS — R0603 Acute respiratory distress: Secondary | ICD-10-CM | POA: Insufficient documentation

## 2015-08-30 DIAGNOSIS — Z86711 Personal history of pulmonary embolism: Secondary | ICD-10-CM | POA: Insufficient documentation

## 2015-08-30 DIAGNOSIS — R0602 Shortness of breath: Secondary | ICD-10-CM | POA: Diagnosis not present

## 2015-08-30 DIAGNOSIS — Z794 Long term (current) use of insulin: Secondary | ICD-10-CM | POA: Insufficient documentation

## 2015-08-30 DIAGNOSIS — N183 Chronic kidney disease, stage 3 unspecified: Secondary | ICD-10-CM | POA: Insufficient documentation

## 2015-08-30 DIAGNOSIS — N184 Chronic kidney disease, stage 4 (severe): Secondary | ICD-10-CM | POA: Diagnosis present

## 2015-08-30 DIAGNOSIS — N179 Acute kidney failure, unspecified: Secondary | ICD-10-CM | POA: Insufficient documentation

## 2015-08-30 DIAGNOSIS — G4733 Obstructive sleep apnea (adult) (pediatric): Secondary | ICD-10-CM | POA: Diagnosis present

## 2015-08-30 DIAGNOSIS — I519 Heart disease, unspecified: Secondary | ICD-10-CM | POA: Diagnosis present

## 2015-08-30 DIAGNOSIS — I129 Hypertensive chronic kidney disease with stage 1 through stage 4 chronic kidney disease, or unspecified chronic kidney disease: Secondary | ICD-10-CM | POA: Insufficient documentation

## 2015-08-30 DIAGNOSIS — Z853 Personal history of malignant neoplasm of breast: Secondary | ICD-10-CM | POA: Diagnosis not present

## 2015-08-30 DIAGNOSIS — Z79899 Other long term (current) drug therapy: Secondary | ICD-10-CM | POA: Insufficient documentation

## 2015-08-30 DIAGNOSIS — D649 Anemia, unspecified: Secondary | ICD-10-CM | POA: Diagnosis present

## 2015-08-30 DIAGNOSIS — Z5181 Encounter for therapeutic drug level monitoring: Secondary | ICD-10-CM | POA: Diagnosis not present

## 2015-08-30 DIAGNOSIS — E119 Type 2 diabetes mellitus without complications: Secondary | ICD-10-CM | POA: Diagnosis not present

## 2015-08-30 DIAGNOSIS — R0600 Dyspnea, unspecified: Secondary | ICD-10-CM | POA: Diagnosis not present

## 2015-08-30 DIAGNOSIS — R938 Abnormal findings on diagnostic imaging of other specified body structures: Secondary | ICD-10-CM | POA: Diagnosis not present

## 2015-08-30 DIAGNOSIS — I10 Essential (primary) hypertension: Secondary | ICD-10-CM | POA: Diagnosis present

## 2015-08-30 DIAGNOSIS — I82439 Acute embolism and thrombosis of unspecified popliteal vein: Secondary | ICD-10-CM | POA: Diagnosis present

## 2015-08-30 DIAGNOSIS — Z6835 Body mass index (BMI) 35.0-35.9, adult: Secondary | ICD-10-CM

## 2015-08-30 DIAGNOSIS — Z7901 Long term (current) use of anticoagulants: Secondary | ICD-10-CM | POA: Diagnosis not present

## 2015-08-30 DIAGNOSIS — I5189 Other ill-defined heart diseases: Secondary | ICD-10-CM | POA: Diagnosis present

## 2015-08-30 DIAGNOSIS — I2699 Other pulmonary embolism without acute cor pulmonale: Secondary | ICD-10-CM | POA: Diagnosis present

## 2015-08-30 DIAGNOSIS — M79605 Pain in left leg: Secondary | ICD-10-CM | POA: Insufficient documentation

## 2015-08-30 LAB — PROTIME-INR
INR: 1.84 — ABNORMAL HIGH (ref 0.00–1.49)
PROTHROMBIN TIME: 21.2 s — AB (ref 11.6–15.2)

## 2015-08-30 LAB — CBC WITH DIFFERENTIAL/PLATELET
BASOS PCT: 0 %
Basophils Absolute: 0 10*3/uL (ref 0.0–0.1)
EOS ABS: 0.1 10*3/uL (ref 0.0–0.7)
EOS PCT: 3 %
HCT: 35.8 % — ABNORMAL LOW (ref 36.0–46.0)
Hemoglobin: 12.1 g/dL (ref 12.0–15.0)
LYMPHS ABS: 2.2 10*3/uL (ref 0.7–4.0)
Lymphocytes Relative: 47 %
MCH: 31.4 pg (ref 26.0–34.0)
MCHC: 33.8 g/dL (ref 30.0–36.0)
MCV: 93 fL (ref 78.0–100.0)
Monocytes Absolute: 0.4 10*3/uL (ref 0.1–1.0)
Monocytes Relative: 9 %
Neutro Abs: 1.9 10*3/uL (ref 1.7–7.7)
Neutrophils Relative %: 41 %
PLATELETS: 230 10*3/uL (ref 150–400)
RBC: 3.85 MIL/uL — AB (ref 3.87–5.11)
RDW: 13.4 % (ref 11.5–15.5)
WBC: 4.7 10*3/uL (ref 4.0–10.5)

## 2015-08-30 LAB — HEPARIN LEVEL (UNFRACTIONATED)
HEPARIN UNFRACTIONATED: 0.84 [IU]/mL — AB (ref 0.30–0.70)
Heparin Unfractionated: 0.63 IU/mL (ref 0.30–0.70)

## 2015-08-30 LAB — GLUCOSE, CAPILLARY
GLUCOSE-CAPILLARY: 157 mg/dL — AB (ref 65–99)
Glucose-Capillary: 133 mg/dL — ABNORMAL HIGH (ref 65–99)
Glucose-Capillary: 125 mg/dL — ABNORMAL HIGH (ref 65–99)
Glucose-Capillary: 159 mg/dL — ABNORMAL HIGH (ref 65–99)

## 2015-08-30 LAB — RAPID URINE DRUG SCREEN, HOSP PERFORMED
AMPHETAMINES: NOT DETECTED
Barbiturates: NOT DETECTED
Benzodiazepines: NOT DETECTED
Cocaine: NOT DETECTED
Opiates: NOT DETECTED
Tetrahydrocannabinol: NOT DETECTED

## 2015-08-30 LAB — D-DIMER, QUANTITATIVE (NOT AT ARMC): D DIMER QUANT: 0.34 ug{FEU}/mL (ref 0.00–0.50)

## 2015-08-30 LAB — BASIC METABOLIC PANEL
Anion gap: 7 (ref 5–15)
BUN: 46 mg/dL — AB (ref 6–20)
CO2: 23 mmol/L (ref 22–32)
CREATININE: 2.81 mg/dL — AB (ref 0.44–1.00)
Calcium: 9.7 mg/dL (ref 8.9–10.3)
Chloride: 108 mmol/L (ref 101–111)
GFR calc Af Amer: 19 mL/min — ABNORMAL LOW (ref >60)
GFR, EST NON AFRICAN AMERICAN: 16 mL/min — AB (ref >60)
Glucose, Bld: 150 mg/dL — ABNORMAL HIGH (ref 65–99)
POTASSIUM: 4.1 mmol/L (ref 3.5–5.1)
SODIUM: 138 mmol/L (ref 135–145)

## 2015-08-30 LAB — BRAIN NATRIURETIC PEPTIDE: B Natriuretic Peptide: 12.2 pg/mL (ref 0.0–100.0)

## 2015-08-30 LAB — TROPONIN I: Troponin I: 0.03 ng/mL (ref <0.031)

## 2015-08-30 LAB — SEDIMENTATION RATE: SED RATE: 15 mm/h (ref 0–22)

## 2015-08-30 MED ORDER — COLCHICINE 0.6 MG PO TABS
0.6000 mg | ORAL_TABLET | Freq: Every day | ORAL | Status: DC
  Administered 2015-08-31 – 2015-09-01 (×2): 0.6 mg via ORAL
  Filled 2015-08-30 (×2): qty 1

## 2015-08-30 MED ORDER — WARFARIN - PHARMACIST DOSING INPATIENT: Freq: Every day | Status: DC

## 2015-08-30 MED ORDER — SODIUM CHLORIDE 0.9 % IV SOLN: INTRAVENOUS | Status: AC

## 2015-08-30 MED ORDER — HYDROCODONE-ACETAMINOPHEN 5-325 MG PO TABS: 1.0000 | ORAL_TABLET | ORAL | Status: DC | PRN

## 2015-08-30 MED ORDER — COLCHICINE 0.6 MG PO TABS
0.3000 mg | ORAL_TABLET | Freq: Every day | ORAL | Status: DC
  Administered 2015-08-30: 0.3 mg via ORAL
  Filled 2015-08-30: qty 1

## 2015-08-30 MED ORDER — ROSUVASTATIN CALCIUM 10 MG PO TABS
10.0000 mg | ORAL_TABLET | Freq: Every day | ORAL | Status: DC
  Administered 2015-08-30 – 2015-08-31 (×2): 10 mg via ORAL
  Filled 2015-08-30 (×2): qty 1

## 2015-08-30 MED ORDER — SODIUM CHLORIDE 0.9% FLUSH
3.0000 mL | Freq: Two times a day (BID) | INTRAVENOUS | Status: DC
  Administered 2015-08-31 – 2015-09-01 (×3): 3 mL via INTRAVENOUS

## 2015-08-30 MED ORDER — ACETAMINOPHEN 650 MG RE SUPP: 650.0000 mg | Freq: Four times a day (QID) | RECTAL | Status: DC | PRN

## 2015-08-30 MED ORDER — WARFARIN SODIUM 4 MG PO TABS
4.0000 mg | ORAL_TABLET | Freq: Once | ORAL | Status: AC
  Administered 2015-08-30: 4 mg via ORAL
  Filled 2015-08-30: qty 1

## 2015-08-30 MED ORDER — IOPAMIDOL (ISOVUE-370) INJECTION 76%: 100.0000 mL | Freq: Once | INTRAVENOUS | Status: DC | PRN

## 2015-08-30 MED ORDER — WARFARIN SODIUM 4 MG PO TABS: 4.0000 mg | ORAL_TABLET | Freq: Every day | ORAL | Status: DC

## 2015-08-30 MED ORDER — HEPARIN SODIUM (PORCINE) 5000 UNIT/ML IJ SOLN
4000.0000 [IU] | Freq: Once | INTRAMUSCULAR | Status: AC
  Administered 2015-08-30: 4000 [IU] via INTRAVENOUS
  Filled 2015-08-30: qty 1

## 2015-08-30 MED ORDER — ALBUTEROL SULFATE (2.5 MG/3ML) 0.083% IN NEBU: 2.5000 mg | INHALATION_SOLUTION | RESPIRATORY_TRACT | Status: DC | PRN

## 2015-08-30 MED ORDER — ALBUTEROL SULFATE (2.5 MG/3ML) 0.083% IN NEBU: 3.0000 mL | INHALATION_SOLUTION | Freq: Four times a day (QID) | RESPIRATORY_TRACT | Status: DC | PRN

## 2015-08-30 MED ORDER — POLYETHYLENE GLYCOL 3350 17 G PO PACK: 17.0000 g | PACK | Freq: Every day | ORAL | Status: DC | PRN

## 2015-08-30 MED ORDER — INSULIN GLARGINE 100 UNIT/ML ~~LOC~~ SOLN
15.0000 [IU] | Freq: Every day | SUBCUTANEOUS | Status: DC
Start: 2015-08-30 — End: 2015-09-01
  Administered 2015-08-30 – 2015-08-31 (×2): 15 [IU] via SUBCUTANEOUS
  Filled 2015-08-30 (×3): qty 0.15

## 2015-08-30 MED ORDER — AMLODIPINE BESYLATE 10 MG PO TABS
10.0000 mg | ORAL_TABLET | Freq: Every day | ORAL | Status: DC
  Administered 2015-08-30 – 2015-08-31 (×2): 10 mg via ORAL
  Filled 2015-08-30 (×2): qty 1

## 2015-08-30 MED ORDER — HEPARIN (PORCINE) IN NACL 100-0.45 UNIT/ML-% IJ SOLN
1050.0000 [IU]/h | INTRAMUSCULAR | Status: DC
  Administered 2015-08-30: 1200 [IU]/h via INTRAVENOUS
  Administered 2015-08-30: 1050 [IU]/h via INTRAVENOUS
  Filled 2015-08-30 (×2): qty 250

## 2015-08-30 MED ORDER — IPRATROPIUM-ALBUTEROL 0.5-2.5 (3) MG/3ML IN SOLN
3.0000 mL | Freq: Two times a day (BID) | RESPIRATORY_TRACT | Status: DC
  Administered 2015-08-31 (×2): 3 mL via RESPIRATORY_TRACT
  Filled 2015-08-30 (×2): qty 3

## 2015-08-30 MED ORDER — IPRATROPIUM-ALBUTEROL 0.5-2.5 (3) MG/3ML IN SOLN
3.0000 mL | Freq: Four times a day (QID) | RESPIRATORY_TRACT | Status: DC
  Administered 2015-08-30 (×2): 3 mL via RESPIRATORY_TRACT
  Filled 2015-08-30: qty 3

## 2015-08-30 MED ORDER — HYDRALAZINE HCL 20 MG/ML IJ SOLN: 10.0000 mg | Freq: Three times a day (TID) | INTRAMUSCULAR | Status: DC | PRN

## 2015-08-30 MED ORDER — INSULIN ASPART 100 UNIT/ML ~~LOC~~ SOLN: 0.0000 [IU] | Freq: Every day | SUBCUTANEOUS | Status: DC

## 2015-08-30 MED ORDER — INSULIN ASPART 100 UNIT/ML ~~LOC~~ SOLN
0.0000 [IU] | Freq: Three times a day (TID) | SUBCUTANEOUS | Status: DC
  Administered 2015-08-30 – 2015-09-01 (×2): 3 [IU] via SUBCUTANEOUS

## 2015-08-30 NOTE — ED Provider Notes (Signed)
CSN: [id number]     Arrival date & time 08/30/15  0030 History  By signing my name below, I, Altamease Oiler, attest that this documentation has been prepared under the direction and in the presence of Hanifa Antonetti, MD. Electronically Signed: Altamease Oiler, ED Scribe. 08/30/2015. 1:04 AM   Chief Complaint  Patient presents with  . Shortness of Breath   Patient is a 65 y.o. female presenting with shortness of breath. The history is provided by the patient. No language interpreter was used.  Shortness of Breath Severity:  Moderate Onset quality:  Sudden Duration:  5 hours Timing:  Constant Progression:  Unchanged Chronicity:  Recurrent Context: not activity and not smoke exposure   Relieved by:  Nothing Worsened by:  Nothing tried Ineffective treatments:  None tried Associated symptoms: headaches   Associated symptoms: no chest pain, no cough, no diaphoresis and no wheezing   Risk factors: hx of PE/DVT    Patricia Morgan is a 65 y.o. female with history of PE on Coumadin, HTN, HLD, and DM  who presents to the Emergency Department complaining of constant, moderate, SOB with onset approximately 5 hours ago. Associated symptoms include slight headache. Pt denies chest pain, cough, congestion, and  LE swelling. Her  INR was 1.9 two weeks ago but her coumadin was not increased at that time.  Past Medical History  Diagnosis Date  . OBSTRUCTIVE SLEEP APNEA     CPAP  . PULMONARY EMBOLISM 09/24/2008    anticoag thru 03/2010  . Proteinuria   . BREAST CANCER, HX OF 1995    Right Breast  . ANEMIA-NOS   . DIABETES MELLITUS, TYPE II   . HYPERLIPIDEMIA   . HYPERTENSION   . CHRONIC KIDNEY DISEASE STAGE III (MODERATE)    Past Surgical History  Procedure Laterality Date  . Mastectomy  1995    Right  . Insertion port a cath    . Port-a-cath removal     Family History  Problem Relation Age of Onset  . Diabetes Sister    Social History  Substance Use Topics  . Smoking status: Never  Smoker   . Smokeless tobacco: None     Comment: Lives alone-divorced  . Alcohol Use: No   OB History    No data available     Review of Systems  Constitutional: Negative for diaphoresis.  HENT: Negative for congestion.   Respiratory: Positive for shortness of breath. Negative for cough and wheezing.   Cardiovascular: Positive for leg swelling. Negative for chest pain.  Neurological: Positive for headaches.  All other systems reviewed and are negative.   Allergies  Review of patient's allergies indicates no known allergies.  Home Medications   Prior to Admission medications   Medication Sig Start Date End Date Taking? Authorizing Provider  albuterol (PROAIR HFA) 108 (90 BASE) MCG/ACT inhaler Inhale 1-2 puffs into the lungs every 6 (six) hours as needed for wheezing or shortness of breath. Patient not taking: Reported on 09/22/2014 02/07/14   Juluis Mire, MD  amLODipine (NORVASC) 10 MG tablet Take 1 tablet (10 mg total) by mouth daily. 04/04/13   Juluis Mire, MD  colchicine 0.6 MG tablet Take 0.6 mg by mouth daily.  07/13/14   Historical Provider, MD  diclofenac sodium (VOLTAREN) 1 % GEL Apply 2 g topically 4 (four) times daily. Patient not taking: Reported on 09/22/2014 09/17/13   Hester Mates, MD  enoxaparin (LOVENOX) 150 MG/ML injection Inject 0.63 mLs (95 mg total) into the skin daily. 09/24/14  Florencia Reasons, MD  furosemide (LASIX) 20 MG tablet Take 1 tablet (20 mg total) by mouth 2 (two) times daily. Patient not taking: Reported on 09/22/2014 11/12/13   Juluis Mire, MD  insulin glargine (LANTUS) 100 UNIT/ML injection Inject 30 units into skin BID as directed Patient not taking: Reported on 09/22/2014 05/02/14   Juluis Mire, MD  insulin lispro (HUMALOG) 100 UNIT/ML injection Inject 10 Units into the skin as needed. Three times per day with meals Patient not taking: Reported on 09/22/2014 04/04/13   Juluis Mire, MD  lisinopril (PRINIVIL,ZESTRIL) 40 MG tablet Take 1 tablet (40 mg  total) by mouth daily. 10/24/13   Juluis Mire, MD  neomycin-colistin-hydrocortisone-thonzonium (CORTISPORIN-TC) 3.07-16-08-0.5 MG/ML otic suspension Place 4 drops into the right ear 4 (four) times daily. Patient not taking: Reported on 09/22/2014 09/17/13   Hester Mates, MD  rosuvastatin (CRESTOR) 10 MG tablet Take 1 tablet (10 mg total) by mouth at bedtime. 10/25/13 10/25/14  Juluis Mire, MD  traMADol (ULTRAM) 50 MG tablet Take 1 tablet (50 mg total) by mouth every 6 (six) hours as needed. Patient not taking: Reported on 09/22/2014 08/13/13   Carlisle Cater, PA-C  warfarin (COUMADIN) 10 MG tablet Take 1 tablet (10 mg total) by mouth daily at 6 PM. 09/24/14   Florencia Reasons, MD   BP 140/79 mmHg  Pulse 93  Temp(Src) 98.4 F (36.9 C) (Oral)  Ht 5\' 2"  (1.575 m)  Wt 225 lb (102.059 kg)  BMI 41.14 kg/m2  SpO2 100% Physical Exam  Constitutional: She is oriented to person, place, and time. She appears well-developed and well-nourished.  HENT:  Head: Normocephalic.  Mouth/Throat: Oropharynx is clear and moist.  Moist mucous membranes No exudate  Eyes: Pupils are equal, round, and reactive to light.  Neck: Normal range of motion. Neck supple.  Trachea midline  Cardiovascular: Normal rate, regular rhythm and intact distal pulses.   Pulmonary/Chest: Effort normal and breath sounds normal. No stridor. No respiratory distress. She has no wheezes. She has no rales. She exhibits no tenderness.  CTAB  Abdominal: Soft. Bowel sounds are normal. She exhibits no mass. There is no tenderness. There is no rebound and no guarding.  Musculoskeletal: Normal range of motion. She exhibits edema. She exhibits no tenderness.  Bilateral ankle and pedal edema with no cords or tenderness  Neurological: She is alert and oriented to person, place, and time.  Skin: Skin is warm and dry.  Psychiatric: She has a normal mood and affect. Her behavior is normal.  Nursing note and vitals reviewed.   ED Course  Procedures (including  critical care time) DIAGNOSTIC STUDIES: Oxygen Saturation is 100% on RA,  normal by my interpretation.    COORDINATION OF CARE: 12:52 AM Discussed treatment plan which includes lab work, CXR, EKG, CT angio chest PE with pt at bedside and pt agreed to plan.  Labs Review Labs Reviewed  CBC WITH DIFFERENTIAL/PLATELET  BASIC METABOLIC PANEL  PROTIME-INR  TROPONIN I    Imaging Review No results found. I have personally reviewed and evaluated these images and lab results as part of my medical decision-making.   EKG Interpretation None      EKG Interpretation  Date/Time:  Sunday Sundiata Ferrick 16 2017 00:47:04 EDT Ventricular Rate:  85 PR Interval:  204 QRS Duration: 98 QT Interval:  402 QTC Calculation: 478 R Axis:   -27 Text Interpretation:  Normal sinus rhythm Left ventricular hypertrophy Confirmed by [facility]  MD, Herminio Kniskern ([id number]) on 08/30/2015 1:16:50 AM  MDM   Final diagnoses:  None     Filed Vitals:   08/30/15 0035  BP: 140/79  Pulse: 93  Temp: 98.4 F (36.9 C)   Results for orders placed or performed during the hospital encounter of 09/22/14  CBC  Result Value Ref Range   WBC 6.5 4.0 - 10.5 K/uL   RBC 3.52 (L) 3.87 - 5.11 MIL/uL   Hemoglobin 10.4 (L) 12.0 - 15.0 g/dL   HCT 30.9 (L) 36.0 - 46.0 %   MCV 87.8 78.0 - 100.0 fL   MCH 29.5 26.0 - 34.0 pg   MCHC 33.7 30.0 - 36.0 g/dL   RDW 12.4 11.5 - 15.5 %   Platelets 220 150 - 400 K/uL  Basic metabolic panel  Result Value Ref Range   Sodium 139 135 - 145 mmol/L   Potassium 4.3 3.5 - 5.1 mmol/L   Chloride 109 101 - 111 mmol/L   CO2 23 22 - 32 mmol/L   Glucose, Bld 111 (H) 70 - 99 mg/dL   BUN 33 (H) 6 - 20 mg/dL   Creatinine, Ser 2.40 (H) 0.44 - 1.00 mg/dL   Calcium 9.4 8.9 - 10.3 mg/dL   GFR calc non Af Amer 20 (L) >60 mL/min   GFR calc Af Amer 23 (L) >60 mL/min   Anion gap 7 5 - 15  Protime-INR  Result Value Ref Range   Prothrombin Time 16.4 (H) 11.6 - 15.2 seconds   INR 1.31 0.00 - 1.49  APTT   Result Value Ref Range   aPTT 41 (H) 24 - 37 seconds  Heparin level (unfractionated)  Result Value Ref Range   Heparin Unfractionated >2.20 (H) 0.30 - 0.70 IU/mL  CBC  Result Value Ref Range   WBC 5.5 4.0 - 10.5 K/uL   RBC 3.52 (L) 3.87 - 5.11 MIL/uL   Hemoglobin 10.6 (L) 12.0 - 15.0 g/dL   HCT 31.0 (L) 36.0 - 46.0 %   MCV 88.1 78.0 - 100.0 fL   MCH 30.1 26.0 - 34.0 pg   MCHC 34.2 30.0 - 36.0 g/dL   RDW 12.7 11.5 - 15.5 %   Platelets 219 150 - 400 K/uL  Glucose, capillary  Result Value Ref Range   Glucose-Capillary 170 (H) 70 - 99 mg/dL  APTT  Result Value Ref Range   aPTT 60 (H) 24 - 37 seconds  Protime-INR  Result Value Ref Range   Prothrombin Time 15.3 (H) 11.6 - 15.2 seconds   INR 1.20 0.00 - 1.49  Glucose, capillary  Result Value Ref Range   Glucose-Capillary 150 (H) 70 - 99 mg/dL  Hemoglobin A1c  Result Value Ref Range   Hgb A1c MFr Bld 6.4 (H) 4.8 - 5.6 %   Mean Plasma Glucose 137 mg/dL  Glucose, capillary  Result Value Ref Range   Glucose-Capillary 177 (H) 70 - 99 mg/dL  CBC  Result Value Ref Range   WBC 4.8 4.0 - 10.5 K/uL   RBC 3.33 (L) 3.87 - 5.11 MIL/uL   Hemoglobin 9.8 (L) 12.0 - 15.0 g/dL   HCT 29.6 (L) 36.0 - 46.0 %   MCV 88.9 78.0 - 100.0 fL   MCH 29.4 26.0 - 34.0 pg   MCHC 33.1 30.0 - 36.0 g/dL   RDW 12.5 11.5 - 15.5 %   Platelets 240 150 - 400 K/uL  Protime-INR  Result Value Ref Range   Prothrombin Time 14.7 11.6 - 15.2 seconds   INR 1.14 0.00 - 1.49  Glucose, capillary  Result Value Ref Range   Glucose-Capillary 74 70 - 99 mg/dL  Glucose, capillary  Result Value Ref Range   Glucose-Capillary 115 (H) 70 - 99 mg/dL   Comment 1 Notify RN    Comment 2 Document in Chart   Hepatic function panel  Result Value Ref Range   Total Protein 6.8 6.5 - 8.1 g/dL   Albumin 3.0 (L) 3.5 - 5.0 g/dL   AST 20 15 - 41 U/L   ALT 15 14 - 54 U/L   Alkaline Phosphatase 74 38 - 126 U/L   Total Bilirubin 0.6 0.3 - 1.2 mg/dL   Bilirubin, Direct 0.1 0.1 - 0.5  mg/dL   Indirect Bilirubin 0.5 0.3 - 0.9 mg/dL  Glucose, capillary  Result Value Ref Range   Glucose-Capillary 130 (H) 70 - 99 mg/dL  Glucose, capillary  Result Value Ref Range   Glucose-Capillary 109 (H) 70 - 99 mg/dL  Glucose, capillary  Result Value Ref Range   Glucose-Capillary 115 (H) 70 - 99 mg/dL  I-stat troponin, ED  (not at [facility], [facility])  Result Value Ref Range   Troponin i, poc 0.01 0.00 - 0.08 ng/mL   Comment 3           No results found.  Medications  iopamidol (ISOVUE-370) 76 % injection 100 mL (not administered)  heparin ADULT infusion 100 units/mL (25000 units/250 mL) (not administered)  heparin injection 4,000 Units (4,000 Units Intravenous Given 08/30/15 0246)   Given subtherapeutic INR for over a week by her account  and sudden onset SOB PE cannot be excluded in this high risk patient.  Will need VQ will start heparin and admit Case d/w Dr. Loleta Books obs tele   I personally performed the services described in this documentation, which was scribed in my presence. The recorded information has been reviewed and is accurate.      Veatrice Kells, MD 08/30/15 [phone]

## 2015-08-30 NOTE — Progress Notes (Signed)
65 yo F HTN, DM, CKD and recurrent DVT/PE on warfarin.    Has been subtherapeutic.  Tonight had acute SOB, lungs clear.  Tachycardia.  ER is starting heparin, wants VQ. BP 118/73 mmHg  Pulse 85  Temp(Src) 98.4 F (36.9 C) (Oral)  Ht 5\' 2"  (1.575 m)  Wt 102.059 kg (225 lb)  BMI 41.14 kg/m2  SpO2 100% on 2L  To tele.

## 2015-08-30 NOTE — Progress Notes (Signed)
ANTICOAGULATION CONSULT NOTE - Follow up [facility] for Heparin/warfarin  Indication: pulmonary embolus  No Known Allergies  Patient Measurements: Height: 5\' 2"  (157.5 cm) Weight: 221 lb 9.6 oz (100.517 kg) IBW/kg (Calculated) : 50.1  Heparin dosing weight: 74kg  Vital Signs: Temp: 98.8 F (37.1 C) (04/16 2108) Temp Source: Oral (04/16 2108) BP: 116/65 mmHg (04/16 2108) Pulse Rate: 89 (04/16 2222)  Labs:  Recent Labs  08/30/15 0100 08/30/15 1238 08/30/15 2238  HGB 12.1  --   --   HCT 35.8*  --   --   PLT 230  --   --   LABPROT 21.2*  --   --   INR 1.84*  --   --   HEPARINUNFRC  --  0.84* 0.63  CREATININE 2.81*  --   --   TROPONINI 0.03  --   --     Estimated Creatinine Clearance: 21.9 mL/min (by C-G formula based on Cr of 2.81).  Assessment: 65 y/o F with hx of PE on warfarin PTA. Heparin/warfarin per pharmacy. On warfarin PTA. INR 1.84 this morning and heparin initiated. HL 0.63 on 1050 units/hr, no new issues per RN and previous CBC stable.   PTA warfarin dose: 5mg  daily except 3mg  MWF  Goal of Therapy:  INR 2-3 Heparin level 0.3-0.7 units/ml Monitor platelets by anticoagulation protocol: Yes   Plan:  -Continue heparin gtt to 1050 units/hr -Daily CBC/HL/INR -Monitor s/sx bleeding -Can transition off heparin once INR therapeutic per provider note  Vincenza Hews, PharmD, BCPS 08/30/2015, 11:47 PM Pager: [phone]

## 2015-08-30 NOTE — ED Notes (Signed)
States, "feel better, oxygen helps", (denies: pain, sob, nausea or dizziness), alert, NAD, calm, interactive, recently up to b/r w/o sx or complaints, steady gait. VSS.

## 2015-08-30 NOTE — ED Notes (Signed)
C/o sob and HA, mild nausea and light headedness. (denies: other pain, vd, fever, or other sx), states, "this is what I feel like when I have a PE".

## 2015-08-30 NOTE — Progress Notes (Signed)
ANTICOAGULATION CONSULT NOTE - Initial Consult  Pharmacy Consult for Heparin  Indication: pulmonary embolus  No Known Allergies  Patient Measurements: Height: 5\' 2"  (157.5 cm) Weight: 225 lb (102.059 kg) IBW/kg (Calculated) : 50.1  Vital Signs: Temp: 98.4 F (36.9 C) (04/16 0035) Temp Source: Oral (04/16 0035) BP: 118/73 mmHg (04/16 0230) Pulse Rate: 85 (04/16 0230)  Labs:  Recent Labs  08/30/15 0100  HGB 12.1  HCT 35.8*  PLT 230  LABPROT 21.2*  INR 1.84*  CREATININE 2.81*  TROPONINI 0.03    Estimated Creatinine Clearance: 22 mL/min (by C-G formula based on Cr of 2.81).   Medical History: Past Medical History  Diagnosis Date  . OBSTRUCTIVE SLEEP APNEA     CPAP  . PULMONARY EMBOLISM 09/24/2008    anticoag thru 03/2010  . Proteinuria   . BREAST CANCER, HX OF 1995    Right Breast  . ANEMIA-NOS   . DIABETES MELLITUS, TYPE II   . HYPERLIPIDEMIA   . HYPERTENSION   . CHRONIC KIDNEY DISEASE STAGE III (MODERATE)     Assessment: 65 y/o F with hx of PE on warfarin, INR is sub-therapeutic at 1.84, starting heparin per pharmacy, CBC good, renal dysfunction noted.   Goal of Therapy:  Heparin level 0.3-0.7 units/ml Monitor platelets by anticoagulation protocol: Yes   Plan:  -Start heparin drip at 1200 units/hr -1100 HL -Daily CBC/HL -Monitor for bleeding  Narda Bonds 08/30/2015,2:41 AM

## 2015-08-30 NOTE — ED Notes (Signed)
Pt in c/o SOB onset tonight around 2000. Denies chest pain, states hx of PE however takes her Warfarin as prescribed. VSS, lung sounds clear, 100% on RA.

## 2015-08-30 NOTE — ED Notes (Signed)
Pt not in room, pt in radiology.

## 2015-08-30 NOTE — ED Notes (Addendum)
Pt ambulatory to room from triage with RT, steady gait, EDP into room upon arrival. Pt alert, NAD, calm, interactive, resps e/u, DOE and increased wob noted.

## 2015-08-30 NOTE — H&P (Signed)
[facility] History and Physical  Katie Ruff [id number] DOB: [birth year] DOA: 08/30/2015  Referring physician: danford PCP: Kandice Hams, MD   Chief Complaint: sob  HPI: Patricia Morgan is a very pleasant  65 y.o. female with a past medical history that includes hypertension, diabetes, chronic kidney disease, rest cancer, recurrent DVT/PE on warfarin since to the emergency department with the chief complaint of sudden onset shortness of breath. Initial evaluation reveals a shin acute respiratory distress with subtherapeutic INR.  Information is obtained from the patient. She reports last evening she developed sudden onset of shortness of breath. Associated symptoms include slight headache and some dizziness with position change. She denies chest pain palpitations cough congestion or worsening lower extremity swelling. She does state "i was afraid to ly down". She denies visual disturbances syncope or near-syncope. She denies abdominal pain nausea vomiting diarrhea dysuria hematuria frequency or urgency. She denies any fever chills or recent sick contacts or travel. She does state that symptoms are quite reminiscent of when she had a PE in the past. Reports compliance with her Coumadin does state that her last visit with her PCP she was told her INR was 1.9 approximately 2 weeks ago. Of note she was hospitalized with acute PE 1 year ago and has had a DVT 5 years prior to that. One point she was on Eliquis but that was discontinued due to her kidney disease.  In the emergency department she is afebrile hemodynamically stable and not hypoxic. INR is 1.84   Review of Systems:  10 point review of systems complete and all systems are negative except as indicated in the history of present illness  Past Medical History  Diagnosis Date  . OBSTRUCTIVE SLEEP APNEA     CPAP  . PULMONARY EMBOLISM 09/24/2008    anticoag thru 03/2010  . Proteinuria   . BREAST CANCER, HX OF 1995    Right  Breast  . ANEMIA-NOS   . DIABETES MELLITUS, TYPE II   . HYPERLIPIDEMIA   . HYPERTENSION   . CHRONIC KIDNEY DISEASE STAGE III (MODERATE)    Past Surgical History  Procedure Laterality Date  . Mastectomy  1995    Right  . Insertion port a cath    . Port-a-cath removal     Social History:  reports that she has never smoked. She does not have any smokeless tobacco history on file. She reports that she does not drink alcohol or use illicit drugs. 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 He is independent with ADLs No Known Allergies  Family History  Problem Relation Age of Onset  . Diabetes Sister      Prior to Admission medications   Medication Sig Start Date End Date Taking? Authorizing Provider  albuterol (PROAIR HFA) 108 (90 BASE) MCG/ACT inhaler Inhale 1-2 puffs into the lungs every 6 (six) hours as needed for wheezing or shortness of breath. Patient not taking: Reported on 09/22/2014 02/07/14   Juluis Mire, MD  amLODipine (NORVASC) 10 MG tablet Take 1 tablet (10 mg total) by mouth daily. 04/04/13   Juluis Mire, MD  colchicine 0.6 MG tablet Take 0.6 mg by mouth daily.  07/13/14   Historical Provider, MD  diclofenac sodium (VOLTAREN) 1 % GEL Apply 2 g topically 4 (four) times daily. Patient not taking: Reported on 09/22/2014 09/17/13   Hester Mates, MD  enoxaparin (LOVENOX) 150 MG/ML injection Inject 0.63 mLs (95 mg total) into the skin  daily. 09/24/14   Florencia Reasons, MD  furosemide (LASIX) 20 MG tablet Take 1 tablet (20 mg total) by mouth 2 (two) times daily. Patient not taking: Reported on 09/22/2014 11/12/13   Juluis Mire, MD  insulin glargine (LANTUS) 100 UNIT/ML injection Inject 30 units into skin BID as directed Patient not taking: Reported on 09/22/2014 05/02/14   Juluis Mire, MD  insulin lispro (HUMALOG) 100 UNIT/ML injection Inject 10 Units into the skin as needed. Three times per day with meals Patient not taking: Reported on  09/22/2014 04/04/13   Juluis Mire, MD  lisinopril (PRINIVIL,ZESTRIL) 40 MG tablet Take 1 tablet (40 mg total) by mouth daily. 10/24/13   Juluis Mire, MD  neomycin-colistin-hydrocortisone-thonzonium (CORTISPORIN-TC) 3.07-16-08-0.5 MG/ML otic suspension Place 4 drops into the right ear 4 (four) times daily. Patient not taking: Reported on 09/22/2014 09/17/13   Hester Mates, MD  rosuvastatin (CRESTOR) 10 MG tablet Take 1 tablet (10 mg total) by mouth at bedtime. 10/25/13 10/25/14  Juluis Mire, MD  traMADol (ULTRAM) 50 MG tablet Take 1 tablet (50 mg total) by mouth every 6 (six) hours as needed. Patient not taking: Reported on 09/22/2014 08/13/13   Carlisle Cater, PA-C  warfarin (COUMADIN) 10 MG tablet Take 1 tablet (10 mg total) by mouth daily at 6 PM. 09/24/14   Florencia Reasons, MD   Physical Exam: Filed Vitals:   08/30/15 0520 08/30/15 0530 08/30/15 0622 08/30/15 0636  BP: 132/72 127/74  140/74  Pulse: 86 73  76  Temp:      TempSrc:    Oral  Resp:      Height:   5\' 2"  (1.575 m)   Weight:   100.517 kg (221 lb 9.6 oz)   SpO2: 100% 100%  100%    Wt Readings from Last 3 Encounters:  08/30/15 100.517 kg (221 lb 9.6 oz)  09/23/14 93.895 kg (207 lb)  09/17/13 100.109 kg (220 lb 11.2 oz)    General:  Appears calm, very pleasant and comfortable Eyes: PERRL, normal lids, irises & conjunctiva ENT: grossly normal hearing, lips & tongue Neck: no LAD, masses or thyromegaly Cardiovascular: RRR, no m/r/g. Trace -1+ LE edema. Pedal pulses present and palpable  Respiratory: CTA bilaterally, no w/r/r. Normal respiratory effort. Abdomen: soft, ntnd, obese positive bowel sounds no guarding or rebounding Skin: no rash or induration seen on limited exam Musculoskeletal: grossly normal tone BUE/BLE, joints without swelling/erythema Psychiatric: grossly normal mood and affect, speech fluent and appropriate Neurologic: grossly non-focal. Oriented 3 speech clear           Labs on Admission:  Basic Metabolic  Panel:  Recent Labs Lab 08/30/15 0100  NA 138  K 4.1  CL 108  CO2 23  GLUCOSE 150*  BUN 46*  CREATININE 2.81*  CALCIUM 9.7   Liver Function Tests: No results for input(s): AST, ALT, ALKPHOS, BILITOT, PROT, ALBUMIN in the last 168 hours. No results for input(s): LIPASE, AMYLASE in the last 168 hours. No results for input(s): AMMONIA in the last 168 hours. CBC:  Recent Labs Lab 08/30/15 0100  WBC 4.7  NEUTROABS 1.9  HGB 12.1  HCT 35.8*  MCV 93.0  PLT 230   Cardiac Enzymes:  Recent Labs Lab 08/30/15 0100  TROPONINI 0.03    BNP (last 3 results)  Recent Labs  08/30/15 0100  BNP 12.2    ProBNP (last 3 results) No results for input(s): PROBNP in the last 8760 hours.  CBG:  Recent Labs Lab 08/30/15 0804  GLUCAP 133*  Radiological Exams on Admission: Dg Chest 2 View  08/30/2015  CLINICAL DATA:  Acute onset of shortness of breath and slight headache. Initial encounter. EXAM: CHEST  2 VIEW COMPARISON:  Chest radiograph performed 09/22/2014 FINDINGS: The lungs are hypoexpanded. Mild bibasilar atelectasis is noted. There is no evidence of pleural effusion or pneumothorax. The heart is borderline normal in size. No acute osseous abnormalities are seen. IMPRESSION: Lungs hypoexpanded, with mild bibasilar atelectasis. Electronically Signed   By: Garald Balding M.D.   On: 08/30/2015 01:52    EKG: Independently reviewed. Normal sinus rhythm Left ventricular hypertrophy  Assessment/Plan Principal Problem:   Acute respiratory distress (HCC) Active Problems:   Diabetes mellitus (HCC)   Benign essential HTN   Obstructive sleep apnea   Acute pulmonary embolism (HCC)   Acute renal failure superimposed on stage 3 chronic kidney disease ([place])   Obesity  #1. Acute respiratory distress likely related to recurrent PE. Patient with a history of recurrent PE/DVT. Much improved on admission. Chest x-ray reveals mild  basilar atelectasis no infiltrate. EKG nsr with LVH.  Oxygen saturation level 100% on 2 L nasal cannula. INR 1.8 -admit to telemetry -Continue heparin per pharmacy -Continue oxygen supplementation as needed -Wean oxygen as able -Transition back to Coumadin once INR therapeutic -obtain CT chest  -obtain 2 Decho for completeness.   #2. Acute/recurrent PE. Recent with a history of same. See #1. No lower extremity edema or leg pain -Continue heparin -Transition to Coumadin once INR therapeutic -may benefit from heme consult OP  #3. Acute renal failure superimposed on stage III chronic disease. Chart review indicates baseline creatinine approximately 2.4. On admission creatinine 2.81. Likely related to #1 in the setting of  Lasix and lisinopril -Gentle IV fluids -Hold nephrotoxins -monitor urine output -Recheck in the morning  4. Diabetes type 2. Serum glucose 150 on admission. She reports taking Lantus twice a day on an as-needed basis as well as using sliding scale on an as-needed basis. Reports her threshold for insulin administration is CBG greater than 140 -We'll provide Lantus 15 units daily at bedtime -Sliding scale for optimal control -Hemoglobin A1c -Carb modified diet  #5. Hypertension. Controlled on admission. Home medications include Lasix and lisinopril as well as Norvasc -We will hold the Lasix and lisinopril related to #3 for now -We will continue Norvasc -Monitor blood pressure -We will resume home medications when appropriate. -We will use when necessary hydralazine  6. Morbid obesity. BMI is 41.2 -Nutritional consult  #7. Obstructive sleep apnea. His used C Pap in the past -CPAP per respiratory   Code Status: full DVT Prophylaxis: Family Communication: none present Disposition Plan: home hopefully 24 hours  Time spent: 60 minutes  [facility]

## 2015-08-30 NOTE — Progress Notes (Signed)
ANTICOAGULATION CONSULT NOTE - Follow up [facility] for Heparin/warfarin  Indication: pulmonary embolus  No Known Allergies  Patient Measurements: Height: 5\' 2"  (157.5 cm) Weight: 221 lb 9.6 oz (100.517 kg) IBW/kg (Calculated) : 50.1  Heparin dosing weight: 74kg  Vital Signs: Temp: 97.8 F (36.6 C) (04/16 1429) Temp Source: Oral (04/16 1429) BP: 136/61 mmHg (04/16 1429) Pulse Rate: 78 (04/16 1429)  Labs:  Recent Labs  08/30/15 0100 08/30/15 1238  HGB 12.1  --   HCT 35.8*  --   PLT 230  --   LABPROT 21.2*  --   INR 1.84*  --   HEPARINUNFRC  --  0.84*  CREATININE 2.81*  --   TROPONINI 0.03  --     Estimated Creatinine Clearance: 21.9 mL/min (by C-G formula based on Cr of 2.81).   Medical History: Past Medical History  Diagnosis Date  . OBSTRUCTIVE SLEEP APNEA     CPAP  . PULMONARY EMBOLISM 09/24/2008    anticoag thru 03/2010  . Proteinuria   . BREAST CANCER, HX OF 1995    Right Breast  . ANEMIA-NOS   . DIABETES MELLITUS, TYPE II   . HYPERLIPIDEMIA   . HYPERTENSION   . CHRONIC KIDNEY DISEASE STAGE III (MODERATE)     Assessment: 65 y/o F with hx of PE on warfarin. Heparin/warfarin per pharmacy. On warfarin PTA. INR 1.84 this morning and heparin initiated. HL supratherapeutic at 0.84. Plan was for no bolus with INR 1.8, but ordered/given by EDP at [facility]. Hgb 12.1, plt 230, SCr 2.81. No bleeding noted.   PTA warfarin dose: 5mg  daily except 3mg  MWF  Goal of Therapy:  INR 2-3 Heparin level 0.3-0.7 units/ml Monitor platelets by anticoagulation protocol: Yes   Plan:  -Decrease heparin gtt to 1050 units/hr -Warfarin 4mg  x1 tonight -2300 HL -Daily CBC/HL/INR -Monitor s/sx bleeding -Can transition off heparin once INR therapeutic per provider note  Stephens November, PharmD Clinical Pharmacy Resident 2:41 PM, 08/30/2015

## 2015-08-30 NOTE — Consult Note (Signed)
Name: Patricia Morgan MRN: [id number] DOB: [birth year]    ADMISSION DATE:  08/30/2015 CONSULTATION DATE:  4/16  REFERRING MD : [facility]  CHIEF COMPLAINT:  SOB  BRIEF PATIENT DESCRIPTION: obese AAF  SIGNIFICANT EVENTS    STUDIES:  4-16 leds>>   HISTORY OF PRESENT ILLNESS:   65 yo AAF, obse, with hx of PE x 2, once 7 years ago treated x 1 year with anticoagulation. 5 years later after trip to [place] she again had DVT and PE, placed on coumadin with last INR 2 weeks ago of 1.9 with no changes in coumadin. She now presents with acute dyspnea bur her renal function precludes CT angio. Regular CT chest reveals ground glass which can be attributed to a whloe host of causes. She oes report left leg pain at the knee joint. No venous doppler study appears in result section. We agree with heparin, will check autoimmune labs along with LEDS. Most likely this is recurrent PE as cause of dyspnea and ground glass will need outpatient follow up. Her OSA was treated by Dr. Gwenette Greet. She is a 1995 breat cancer survivor.  2d could also be used as a tool fr dx in future. No exotic exposures , fevers, chills , sweats or cough. She will need FOB if further evaluations needed.  PAST MEDICAL HISTORY :   has a past medical history of OBSTRUCTIVE SLEEP APNEA; PULMONARY EMBOLISM (09/24/2008); Proteinuria; BREAST CANCER, HX OF (1995); ANEMIA-NOS; DIABETES MELLITUS, TYPE II; HYPERLIPIDEMIA; HYPERTENSION; and CHRONIC KIDNEY DISEASE STAGE III (MODERATE).  has past surgical history that includes Mastectomy (1995); insertion port a cath; and Port-a-cath removal. Prior to Admission medications   Medication Sig Start Date End Date Taking? Authorizing Provider  acetaminophen (TYLENOL) 500 MG tablet Take 500 mg by mouth every 6 (six) hours as needed for mild pain.   Yes Historical Provider, MD  albuterol (PROAIR HFA) 108 (90 BASE) MCG/ACT inhaler Inhale 1-2 puffs into the lungs every 6 (six) hours as needed for wheezing or shortness  of breath. 02/07/14  Yes Juluis Mire, MD  allopurinol (ZYLOPRIM) 100 MG tablet Take 100 mg by mouth daily.   Yes Historical Provider, MD  amLODipine (NORVASC) 10 MG tablet Take 1 tablet (10 mg total) by mouth daily. 04/04/13  Yes Juluis Mire, MD  colchicine 0.6 MG tablet Take 0.6 mg by mouth daily.  07/13/14  Yes Historical Provider, MD  furosemide (LASIX) 20 MG tablet Take 1 tablet (20 mg total) by mouth 2 (two) times daily. 11/12/13  Yes Marjan Rabbani, MD  Insulin Glargine (LANTUS SOLOSTAR) 100 UNIT/ML Solostar Pen Inject 15 Units into the skin 2 (two) times daily. Per sliding scale.   Yes Historical Provider, MD  insulin lispro (HUMALOG KWIKPEN) 100 UNIT/ML KiwkPen Inject 10 Units into the skin 3 (three) times daily.   Yes Historical Provider, MD  lisinopril (PRINIVIL,ZESTRIL) 40 MG tablet Take 1 tablet (40 mg total) by mouth daily. 10/24/13  Yes Marjan Rabbani, MD  rosuvastatin (CRESTOR) 10 MG tablet Take 10 mg by mouth daily.   Yes Historical Provider, MD  warfarin (COUMADIN) 3 MG tablet Take 3 mg by mouth See admin instructions. All other days patient takes 3 mg ( Monday, Wednesday Friday )   Yes Historical Provider, MD  warfarin (COUMADIN) 5 MG tablet Take 5 mg by mouth See admin instructions. Sunday Tuesday Thursday Saturday   Yes Historical Provider, MD  rosuvastatin (CRESTOR) 10 MG tablet Take 1 tablet (10 mg total) by mouth at bedtime. 10/25/13 10/25/14  Juluis Mire, MD   No Known Allergies  FAMILY HISTORY:  family history includes Diabetes in her sister. SOCIAL HISTORY:  reports that she has never smoked. She does not have any smokeless tobacco history on file. She reports that she does not drink alcohol or use illicit drugs.  REVIEW OF SYSTEMS:   Richardson Landry Minor ACNP [facility] Shape PCCM Pager [phone] till 3 pm If no answer page [phone] 08/30/2015, 3:35 PM  SUBJECTIVE: NAD  VITAL SIGNS: Temp:  [97.8 F (36.6 C)-98.4 F (36.9 C)] 97.8 F (36.6 C) (04/16 1429) Pulse Rate:   [73-93] 78 (04/16 1429) Resp:  [23-25] 25 (04/16 1429) BP: (115-140)/(61-86) 136/61 mmHg (04/16 1429) SpO2:  [98 %-100 %] 100 % (04/16 1429) FiO2 (%):  [100 %] 100 % (04/16 0108) Weight:  [221 lb 9.6 oz (100.517 kg)-225 lb (102.059 kg)] 221 lb 9.6 oz (100.517 kg) (04/16 0622)  PHYSICAL EXAMINATION: General:  Obese AAF NAD at rest Neuro:  Grossly intact HEENT:  No JVD, LAN Cardiovascular:  HSR RRR no murmur Lungs:  Decreased bs bases Abdomen:  Obese soft bs Musculoskeletal:  Intact, pain left postieor knee  Skin:  warm   Recent Labs Lab 08/30/15 0100  NA 138  K 4.1  CL 108  CO2 23  BUN 46*  CREATININE 2.81*  GLUCOSE 150*    Recent Labs Lab 08/30/15 0100  HGB 12.1  HCT 35.8*  WBC 4.7  PLT 230   Dg Chest 2 View  08/30/2015  CLINICAL DATA:  Acute onset of shortness of breath and slight headache. Initial encounter. EXAM: CHEST  2 VIEW COMPARISON:  Chest radiograph performed 09/22/2014 FINDINGS: The lungs are hypoexpanded. Mild bibasilar atelectasis is noted. There is no evidence of pleural effusion or pneumothorax. The heart is borderline normal in size. No acute osseous abnormalities are seen. IMPRESSION: Lungs hypoexpanded, with mild bibasilar atelectasis. Electronically Signed   By: Garald Balding M.D.   On: 08/30/2015 01:52   Ct Chest Wo Contrast  08/30/2015  CLINICAL DATA:  65 year old female with history of pulmonary embolism. Shortness of breath. No current chest pain. EXAM: CT CHEST WITHOUT CONTRAST TECHNIQUE: Multidetector CT imaging of the chest was performed following the standard protocol without IV contrast. COMPARISON:  No priors. FINDINGS: Mediastinum/Lymph Nodes: Heart size is borderline enlarged. There is no significant pericardial fluid, thickening or pericardial calcification. There is atherosclerosis of the thoracic aorta, the great vessels of the mediastinum and the coronary arteries, including calcified atherosclerotic plaque in the left circumflex and right  coronary arteries. No pathologically enlarged mediastinal, internal mammary or hilar lymph nodes. Please note that accurate exclusion of hilar adenopathy is limited on noncontrast CT scans. Esophagus is unremarkable in appearance. No axillary lymphadenopathy surgical clips in the right axilla from prior lymph node dissection. Lungs/Pleura: Mild diffuse bronchial thickening with diffuse centrilobular ground-glass attenuation micro nodularity throughout all aspects of the lungs bilaterally. No confluent consolidative airspace disease. No pleural effusions. No larger more suspicious appearing pulmonary nodules or masses are otherwise noted. Upper Abdomen: Heterogeneous areas of low attenuation throughout the hepatic parenchyma, compatible with hepatic steatosis. Multiple low-attenuation renal lesions are noted in the right kidney, incompletely characterized on today's noncontrast CT examination, but statistically likely to represent cysts, with the largest of these lesions measuring up to 6.3 cm in the lower pole of the right kidney. Left kidney is not visualized and may be surgically absent. Musculoskeletal/Soft Tissues: Status post right modified radical mastectomy. There are no aggressive appearing lytic or blastic lesions noted  in the visualized portions of the skeleton. IMPRESSION: 1. Diffuse centrilobular ground-glass attenuation nodularity throughout the lungs bilaterally. This is nonspecific. Differential considerations include hypersensitivity pneumonitis, acute atypical infection or pulmonary vasculitis. 2. Atherosclerosis, including two vessel coronary artery disease. Please note that although the presence of coronary artery calcium documents the presence of coronary artery disease, the severity of this disease and any potential stenosis cannot be assessed on this non-gated CT examination. Assessment for potential risk factor modification, dietary therapy or pharmacologic therapy may be warranted, if  clinically indicated. 3. Additional incidental findings, as above. Electronically Signed   By: Vinnie Langton M.D.   On: 08/30/2015 14:10    ASSESSMENT;    Acute respiratory distress (HCC)   Diabetes mellitus (HCC)   Benign essential HTN   Obstructive sleep apnea   Acute pulmonary embolism (HCC)   CKD (chronic kidney disease), stage IV (HCC)   BREAST CANCER, HX OF   Acute renal failure superimposed on stage 3 chronic kidney disease (HCC)   Obesity   Leg pain, left  Discussion: 65 yo AAF, obse, with hx of PE x 2, once 7 years ago treated x 1 year with anticoagulation. 5 years later after trip to [place] she again had DVT and PE, placed on coumadin with last INR 2 weeks ago of 1.9 with no changes in coumadin. She now presents with acute dyspnea bur her renal function precludes CT angio. Regular CT chest reveals ground glass which can be attributed to a whloe host of causes. She does report left leg pain at the knee joint. No venous doppler study appears in result section. We agree with heparin, will check autoimmune labs along with LEDS. Most likely this is recurrent PE as cause of dyspnea and ground glass will need outpatient follow up. Her OSA was treated by Dr. Gwenette Greet. She is a 1995 breat cancer survivor.  2d could also be used as a tool fr dx in future. No exotic exposures , fevers, chills , sweats or cough. She will need FOB if further evaluations needed.   PLAN:  Agree with heparin May need inr 2.5-3.0 LEDS Autoimmune work up [employer] induced sputum for [employer] ? FOB in future as outpatient Follow up with Sleep MD  Richardson Landry Minor ACNP [facility] PCCM Pager [phone] till 3 pm If no answer page [phone] 08/30/2015, 3:32 PM  Attending Note:  I have examined patient, reviewed labs, studies and notes. I have discussed the case with S Minor, and I agree with the data and plans as amended above. Very pleasant 65 year old woman with a history of breast cancer and pulmonary embolism 2 most recently  treated last year. She has been on warfarin but her most recent INR was slightly subtherapeutic. She presented with acute onset of dyspnea without any prodrome, viral symptoms, change in exposures or constitutional findings. As part of her workup she underwent a CT scan of the chest as detailed above. I examined the scan which shows some subtle scattered reticular opacities with areas of mild groundglass most predominant at the bases. It is not clear to me that her presentation is necessarily related to the CT scan findings as they are minimal but certainly real. Her presentation is most consistent with recurrent pulmonary embolism but note that her d-dimer is negative. Given a high pretest suspicion, pulmonary embolism is still possible of course. I agree with heparin, restarting Coumadin and getting into the therapeutic range. Etiology of her inflammatory changes on CT scan is a large differential diagnosis  includes possible atypical infection, autoimmune inflammatory disease, exposure related noninfectious inflammatory disease. She denies any new exposures or any esoteric exposures.  I believe she needs an autoimmune workup and we will start this. Depending on her labs and on the persistence of her infiltrates on repeat CT we may decide to pursue bronchoscopy.. If her infiltrates become more prominent during this hospitalization then we may be able to correlate her symptoms with the CT scan findings. We will continue to follow.   Baltazar Apo, MD, PhD 08/30/2015, 6:49 PM [facility] [phone] or if no answer [phone]

## 2015-08-31 ENCOUNTER — Observation Stay (HOSPITAL_BASED_OUTPATIENT_CLINIC_OR_DEPARTMENT_OTHER): Payer: Commercial Managed Care - HMO

## 2015-08-31 DIAGNOSIS — I10 Essential (primary) hypertension: Secondary | ICD-10-CM

## 2015-08-31 DIAGNOSIS — N184 Chronic kidney disease, stage 4 (severe): Secondary | ICD-10-CM

## 2015-08-31 DIAGNOSIS — I82431 Acute embolism and thrombosis of right popliteal vein: Secondary | ICD-10-CM

## 2015-08-31 DIAGNOSIS — R938 Abnormal findings on diagnostic imaging of other specified body structures: Secondary | ICD-10-CM | POA: Diagnosis not present

## 2015-08-31 DIAGNOSIS — I82439 Acute embolism and thrombosis of unspecified popliteal vein: Secondary | ICD-10-CM | POA: Diagnosis present

## 2015-08-31 DIAGNOSIS — G4733 Obstructive sleep apnea (adult) (pediatric): Secondary | ICD-10-CM

## 2015-08-31 DIAGNOSIS — I519 Heart disease, unspecified: Secondary | ICD-10-CM | POA: Diagnosis present

## 2015-08-31 DIAGNOSIS — I2699 Other pulmonary embolism without acute cor pulmonale: Secondary | ICD-10-CM

## 2015-08-31 DIAGNOSIS — D649 Anemia, unspecified: Secondary | ICD-10-CM | POA: Diagnosis not present

## 2015-08-31 DIAGNOSIS — R0600 Dyspnea, unspecified: Secondary | ICD-10-CM | POA: Diagnosis not present

## 2015-08-31 DIAGNOSIS — E1121 Type 2 diabetes mellitus with diabetic nephropathy: Secondary | ICD-10-CM

## 2015-08-31 DIAGNOSIS — R0602 Shortness of breath: Secondary | ICD-10-CM | POA: Diagnosis not present

## 2015-08-31 LAB — GLUCOSE, CAPILLARY
GLUCOSE-CAPILLARY: 122 mg/dL — AB (ref 65–99)
Glucose-Capillary: 116 mg/dL — ABNORMAL HIGH (ref 65–99)
Glucose-Capillary: 149 mg/dL — ABNORMAL HIGH (ref 65–99)
Glucose-Capillary: 91 mg/dL (ref 65–99)

## 2015-08-31 LAB — CBC
HEMATOCRIT: 32 % — AB (ref 36.0–46.0)
Hemoglobin: 10.4 g/dL — ABNORMAL LOW (ref 12.0–15.0)
MCH: 30.2 pg (ref 26.0–34.0)
MCHC: 32.5 g/dL (ref 30.0–36.0)
MCV: 93 fL (ref 78.0–100.0)
Platelets: 191 10*3/uL (ref 150–400)
RBC: 3.44 MIL/uL — ABNORMAL LOW (ref 3.87–5.11)
RDW: 14 % (ref 11.5–15.5)
WBC: 3.7 10*3/uL — ABNORMAL LOW (ref 4.0–10.5)

## 2015-08-31 LAB — HEMOGLOBIN A1C
Hgb A1c MFr Bld: 6 % — ABNORMAL HIGH (ref 4.8–5.6)
MEAN PLASMA GLUCOSE: 126 mg/dL

## 2015-08-31 LAB — BASIC METABOLIC PANEL
Anion gap: 7 (ref 5–15)
BUN: 36 mg/dL — ABNORMAL HIGH (ref 6–20)
CALCIUM: 9 mg/dL (ref 8.9–10.3)
CO2: 22 mmol/L (ref 22–32)
Chloride: 113 mmol/L — ABNORMAL HIGH (ref 101–111)
Creatinine, Ser: 2.27 mg/dL — ABNORMAL HIGH (ref 0.44–1.00)
GFR calc Af Amer: 25 mL/min — ABNORMAL LOW (ref >60)
GFR, EST NON AFRICAN AMERICAN: 21 mL/min — AB (ref >60)
GLUCOSE: 110 mg/dL — AB (ref 65–99)
Potassium: 4.6 mmol/L (ref 3.5–5.1)
Sodium: 142 mmol/L (ref 135–145)

## 2015-08-31 LAB — ECHOCARDIOGRAM COMPLETE
HEIGHTINCHES: 62 in
WEIGHTICAEL: 3550.4 [oz_av]

## 2015-08-31 LAB — EXPECTORATED SPUTUM ASSESSMENT W REFEX TO RESP CULTURE

## 2015-08-31 LAB — HEPARIN LEVEL (UNFRACTIONATED): HEPARIN UNFRACTIONATED: 0.68 [IU]/mL (ref 0.30–0.70)

## 2015-08-31 LAB — RHEUMATOID FACTOR: RHEUMATOID FACTOR: 84.2 [IU]/mL — AB (ref 0.0–13.9)

## 2015-08-31 LAB — PROTIME-INR
INR: 2.31 — ABNORMAL HIGH (ref 0.00–1.49)
PROTHROMBIN TIME: 25.1 s — AB (ref 11.6–15.2)

## 2015-08-31 MED ORDER — WARFARIN SODIUM 2 MG PO TABS: 3.0000 mg | ORAL_TABLET | ORAL | Status: DC

## 2015-08-31 MED ORDER — ENOXAPARIN SODIUM 100 MG/ML ~~LOC~~ SOLN
100.0000 mg | Freq: Once | SUBCUTANEOUS | Status: AC
  Administered 2015-08-31: 100 mg via SUBCUTANEOUS
  Filled 2015-08-31: qty 1

## 2015-08-31 MED ORDER — WARFARIN SODIUM 5 MG PO TABS
5.0000 mg | ORAL_TABLET | ORAL | Status: DC
  Administered 2015-08-31: 5 mg via ORAL
  Filled 2015-08-31: qty 1

## 2015-08-31 MED ORDER — METOPROLOL SUCCINATE ER 25 MG PO TB24
25.0000 mg | ORAL_TABLET | Freq: Every day | ORAL | Status: DC
  Administered 2015-09-01: 25 mg via ORAL
  Filled 2015-08-31: qty 1

## 2015-08-31 NOTE — Discharge Instructions (Signed)

## 2015-08-31 NOTE — Progress Notes (Signed)
ANTICOAGULATION CONSULT NOTE - Follow Up Consult  Pharmacy Consult for Heparin-> Lovenox, and Coumadin Indication: hx recurrent PE and DVT  No Known Allergies  Patient Measurements: Height: 5\' 2"  (157.5 cm) Weight: 221 lb 14.4 oz (100.653 kg) IBW/kg (Calculated) : 50.1 Heparin Dosing Weight: 74 kg  Vital Signs: Temp: 98.6 F (37 C) (04/17 0355) Temp Source: Oral (04/17 0355) BP: 111/60 mmHg (04/17 0355) Pulse Rate: 82 (04/17 0714)  Labs:  Recent Labs  08/30/15 0100 08/30/15 1238 08/30/15 2238 08/31/15 0319  HGB 12.1  --   --  10.4*  HCT 35.8*  --   --  32.0*  PLT 230  --   --  191  LABPROT 21.2*  --   --  25.1*  INR 1.84*  --   --  2.31*  HEPARINUNFRC  --  0.84* 0.63 0.68  CREATININE 2.81*  --   --  2.27*  TROPONINI 0.03  --   --   --     Estimated Creatinine Clearance: 27.1 mL/min (by C-G formula based on Cr of 2.27).  Assessment:   On Coumadin prior to admission for hx recurrent PE and DVT.     INR only 1.84 on admit, but up to 2.31 today after Coumadin 4 mg on 4/16.   Heparin level therapeutic (0.68) this am.  Planning discharge later today, after dopplers done.  To change to Lovenox for bridging for another 24hrs.      Home Coumadin regimen: 3 mg MWF, 5 mg TTSS.   Patient reports outpt INR 1.9 a few weeks ago and next appointment planned for 09/21/15.   She also reports that her INR has fluctuated since she resumed Coumadin in May 2016 after recurrent PE.  Goal of Therapy:  INR 2-3 Heparin level 0.3-0.7 units/ml Anti-Xa level 0.6-1 units/ml 4hrs after LMWH dose given Monitor platelets by anticoagulation protocol: Yes   Plan:   Heparin drip stopped this am.  Lovenox 100 mg SQ x 1 this am.  Coumadin 5 mg today.  Suggest Coumadin 5 mg daily except 3 mg on Tuesdays and Fridays.   Discussed with patient.  Would check INR later this week to be sure INR remains >2.  Arty Baumgartner, [place] Pager: [phone] 08/31/2015,10:20 AM

## 2015-08-31 NOTE — Progress Notes (Signed)
  Echocardiogram 2D Echocardiogram has been performed.  Patricia Morgan 08/31/2015, 3:08 PM

## 2015-08-31 NOTE — Care Management Obs Status (Signed)
[facility] NOTIFICATION   Patient Details  Name: Patricia Morgan MRN: [id number] Date of Birth: [birth year]   Medicare Observation Status Notification Given:  Yes    Bethena Roys, RN 08/31/2015, 1:38 PM

## 2015-08-31 NOTE — Progress Notes (Signed)
Name: Patricia Morgan MRN: [id number] DOB: [birth year]    ADMISSION DATE:  08/30/2015 CONSULTATION DATE:  4/16  REFERRING MD : [facility]  CHIEF COMPLAINT:  SOB  BRIEF PATIENT DESCRIPTION: obese AAF  SIGNIFICANT EVENTS    STUDIES:  4-16 Duplex >> right popliteal DVT of indeterminate age   HISTORY OF PRESENT ILLNESS:   65 yo AAF, obse, with hx of PE x 2, once 7 years ago treated x 1 year with anticoagulation. 5 years later after trip to [place] she again had DVT and PE, placed on coumadin with last INR 2 weeks ago of 1.9 with no changes in coumadin. She now presents with acute dyspnea bur her renal function precludes CT angio. Regular CT chest reveals ground glass which can be attributed to a whloe host of causes. She oes report left leg pain at the knee joint. No venous doppler study appears in result section. We agree with heparin, will check autoimmune labs along with LEDS. Most likely this is recurrent PE as cause of dyspnea and ground glass will need outpatient follow up. Her OSA was treated by Dr. Gwenette Greet. She is a 1995 breat cancer survivor.  2d could also be used as a tool fr dx in future. No exotic exposures , fevers, chills , sweats or cough. She will need FOB if further evaluations needed.   SUBJECTIVE:  Dyspnea improved No chest pain Afebrile  VITAL SIGNS: Temp:  [97.7 F (36.5 C)-98.8 F (37.1 C)] 97.7 F (36.5 C) (04/17 1500) Pulse Rate:  [73-89] 81 (04/17 1500) Resp:  [16-18] 16 (04/17 1500) BP: (111-136)/(60-65) 136/64 mmHg (04/17 1500) SpO2:  [97 %-100 %] 98 % (04/17 1500) Weight:  [221 lb 14.4 oz (100.653 kg)] 221 lb 14.4 oz (100.653 kg) (04/17 0355)  PHYSICAL EXAMINATION: General:  Obese AAF NAD sitting up in bed Neuro:  Grossly intact HEENT:  No JVD, LAN Cardiovascular:  HSR RRR no murmur Lungs:  Decreased bs bases Abdomen:  Obese soft bs Musculoskeletal:  Intact, pain left postieor knee  Skin:  warm   Recent Labs Lab 08/30/15 0100 08/31/15 0319  NA 138  142  K 4.1 4.6  CL 108 113*  CO2 23 22  BUN 46* 36*  CREATININE 2.81* 2.27*  GLUCOSE 150* 110*    Recent Labs Lab 08/30/15 0100 08/31/15 0319  HGB 12.1 10.4*  HCT 35.8* 32.0*  WBC 4.7 3.7*  PLT 230 191   Dg Chest 2 View  08/30/2015  CLINICAL DATA:  Acute onset of shortness of breath and slight headache. Initial encounter. EXAM: CHEST  2 VIEW COMPARISON:  Chest radiograph performed 09/22/2014 FINDINGS: The lungs are hypoexpanded. Mild bibasilar atelectasis is noted. There is no evidence of pleural effusion or pneumothorax. The heart is borderline normal in size. No acute osseous abnormalities are seen. IMPRESSION: Lungs hypoexpanded, with mild bibasilar atelectasis. Electronically Signed   By: Garald Balding M.D.   On: 08/30/2015 01:52   Ct Chest Wo Contrast  08/30/2015  CLINICAL DATA:  65 year old female with history of pulmonary embolism. Shortness of breath. No current chest pain. EXAM: CT CHEST WITHOUT CONTRAST TECHNIQUE: Multidetector CT imaging of the chest was performed following the standard protocol without IV contrast. COMPARISON:  No priors. FINDINGS: Mediastinum/Lymph Nodes: Heart size is borderline enlarged. There is no significant pericardial fluid, thickening or pericardial calcification. There is atherosclerosis of the thoracic aorta, the great vessels of the mediastinum and the coronary arteries, including calcified atherosclerotic plaque in the left circumflex and right coronary arteries. No  pathologically enlarged mediastinal, internal mammary or hilar lymph nodes. Please note that accurate exclusion of hilar adenopathy is limited on noncontrast CT scans. Esophagus is unremarkable in appearance. No axillary lymphadenopathy surgical clips in the right axilla from prior lymph node dissection. Lungs/Pleura: Mild diffuse bronchial thickening with diffuse centrilobular ground-glass attenuation micro nodularity throughout all aspects of the lungs bilaterally. No confluent  consolidative airspace disease. No pleural effusions. No larger more suspicious appearing pulmonary nodules or masses are otherwise noted. Upper Abdomen: Heterogeneous areas of low attenuation throughout the hepatic parenchyma, compatible with hepatic steatosis. Multiple low-attenuation renal lesions are noted in the right kidney, incompletely characterized on today's noncontrast CT examination, but statistically likely to represent cysts, with the largest of these lesions measuring up to 6.3 cm in the lower pole of the right kidney. Left kidney is not visualized and may be surgically absent. Musculoskeletal/Soft Tissues: Status post right modified radical mastectomy. There are no aggressive appearing lytic or blastic lesions noted in the visualized portions of the skeleton. IMPRESSION: 1. Diffuse centrilobular ground-glass attenuation nodularity throughout the lungs bilaterally. This is nonspecific. Differential considerations include hypersensitivity pneumonitis, acute atypical infection or pulmonary vasculitis. 2. Atherosclerosis, including two vessel coronary artery disease. Please note that although the presence of coronary artery calcium documents the presence of coronary artery disease, the severity of this disease and any potential stenosis cannot be assessed on this non-gated CT examination. Assessment for potential risk factor modification, dietary therapy or pharmacologic therapy may be warranted, if clinically indicated. 3. Additional incidental findings, as above. Electronically Signed   By: Vinnie Langton M.D.   On: 08/30/2015 14:10    ASSESSMENT;    Acute respiratory distress (HCC)   Diabetes mellitus (HCC)   Benign essential HTN   Obstructive sleep apnea   Acute pulmonary embolism (HCC)   CKD (chronic kidney disease), stage IV (HCC)   BREAST CANCER, HX OF   Acute renal failure superimposed on stage 3 chronic kidney disease (HCC)   Obesity   Leg pain, left  Discussion: 65 year old  woman with a history of breast cancer and pulmonary embolism 2 most recently treated last year. She has been on warfarin but her most recent INR was slightly subtherapeutic. She presented with acute onset of dyspnea without any prodrome, viral symptoms, change in exposures or constitutional findings.CT scan of the chest shows some subtle scattered reticular opacities with areas of mild groundglass most predominant at the bases.  Her presentation is most consistent with recurrent pulmonary embolism but note that her d-dimer is negative. Given a high pretest suspicion, pulmonary embolism is still possible of course.Etiology of her inflammatory changes on CT scan is a large differential diagnosis includes possible atypical infection, autoimmune inflammatory disease, exposure related noninfectious inflammatory disease.    PLAN:  Agree with heparin need inr 2.0-3.0 Autoimmune work up Outpatient follow-up with Dr. Lamonte Sakai on discharge can be set up for one to 2 months to repeat imaging PCCM available as needed  Kara Mead MD. [facility]. [facility] Pager [phone] If no response call [phone]   08/31/2015    08/31/2015, 5:59 PM

## 2015-08-31 NOTE — Progress Notes (Signed)
*  PRELIMINARY RESULTS* Vascular Ultrasound Lower extremity venous duplex has been completed.  Preliminary findings: DVT of indeterminate age noted in the right popliteal vein. No DVT LLE.   Gave verbal results to [facility].   Landry Mellow, RDMS, RVT  08/31/2015, 3:20 PM

## 2015-08-31 NOTE — Progress Notes (Signed)
[facility] Progress Note  Patient: Patricia Morgan [id number]   PCP: Kandice Hams, MD DOB: 06-12-1950   DOA: 08/30/2015   DOS: 08/31/2015   Date of Service: the patient was seen and examined on 08/31/2015  Subjective: Patient denies any complaints of chest pain shortness of breath no nausea or vomiting abdominal pain oxygenation is improved. Nutrition: Tolerating oral diet  Brief hospital course: Patient was admitted on 08/30/2015, with complaint of shortness of breath as well as leg cramps, was found to have groundglass opacities as well as possible chronic changes on the CT scan of the chest and a new DVT on the right popliteal. Also echogram shows EF of 45% with diffuse hypokinesis Currently further plan is to monitor clinically and discussed with hematology.  Assessment and Plan: 1. Popliteal DVT (deep venous thrombosis) (HCC) Due to her chronic kidney disease patient is not a candidate for new oral anticoagulants. Her INR remained therapeutic in the hospital but has remained subtherapeutic for last 2 months which is likely the cause of patient's recurrent DVT. Patient will be bridged with Lovenox for total of 2 days. Patient will also ideally need a hematology follow-up as an outpatient.  2. Cardiac myopathy with left ventricle systolic dysfunction. Discussion fraction 45% with diffuse hypokinesis, inferolateral. No significant change in the EKG patient never had any chest pain. Patient denies having any prior cardiac workup or stress test or cardiac cath. Unaware of having any prior echogram as well. We change her amlodipine to Toprol-XL. Continue monitoring on telemetry. Patient would benefit from some diuretics as well. We will reassess in the morning.  3. Chronic kidney disease stage IV. Acute on chronic kidney injury. Patient presented with mild worsening of renal function he was given IV fluids overnight. Renal function is back to his baseline. Recommend to  follow up with nephrology as an outpatient.  4. Type 2 diabetes mellitus. Diabetic nephropathy. Hemoglobin A1c 6.0 on admission. Well-controlled. We'll continue current management.  5. Groundglass opacities on CT scan of the chest. Pulmonary was consulted. Autoimmune workup was initiated and rheumatoid factor is a significantly elevated. At present does not appear to be any acute distress. Pulmonary recommends outpatient follow-up.  Activity: Currently independent Bowel regimen: last BM 08/30/2015 DVT Prophylaxis: on therapeutic anticoagulation. Nutrition: Cardiac and diabetic diet Advance goals of care discussion: Full code  Procedures: Echocardiogram, vascular Doppler Consultants: Pulmonary Antibiotics: Anti-infectives    None       Family Communication: family was present at bedside, at the time of interview. The pt provided permission to discuss medical plan with the family. Opportunity was given to ask question and all questions were answered satisfactorily.   Disposition:  Expected discharge date:09/01/2015 Barriers to safe discharge: Hypoxia and anemia   Intake/Output Summary (Last 24 hours) at 08/31/15 1805 Last data filed at 08/31/15 1700  Gross per 24 hour  Intake 1408.5 ml  Output   1500 ml  Net  -91.5 ml   Filed Weights   08/30/15 0035 08/30/15 0622 08/31/15 0355  Weight: 102.059 kg (225 lb) 100.517 kg (221 lb 9.6 oz) 100.653 kg (221 lb 14.4 oz)    Objective: Physical Exam: Filed Vitals:   08/30/15 2222 08/31/15 0355 08/31/15 0714 08/31/15 1500  BP:  111/60  136/64  Pulse: 89 73 82 81  Temp:  98.6 F (37 C)  97.7 F (36.5 C)  TempSrc:  Oral  Oral  Resp: 18  16 16   Height:      Weight:  100.653 kg (221  lb 14.4 oz)    SpO2: 98% 97% 97% 98%     General: Appear in mild distress, no Rash; Oral Mucosa moist. Cardiovascular: S1 and S2 Present, no Murmur, no JVD Respiratory: Bilateral Air entry present and bilateral Crackles, no wheezes Abdomen:  Bowel Sound present, Soft and no tenderness Extremities: no Pedal edema, no calf tenderness Neurology: Grossly no focal neuro deficit.  Data Reviewed: CBC:  Recent Labs Lab 08/30/15 0100 08/31/15 0319  WBC 4.7 3.7*  NEUTROABS 1.9  --   HGB 12.1 10.4*  HCT 35.8* 32.0*  MCV 93.0 93.0  PLT 230 [id number]   Basic Metabolic Panel:  Recent Labs Lab 08/30/15 0100 08/31/15 0319  NA 138 142  K 4.1 4.6  CL 108 113*  CO2 23 22  GLUCOSE 150* 110*  BUN 46* 36*  CREATININE 2.81* 2.27*  CALCIUM 9.7 9.0   Liver Function Tests: No results for input(s): AST, ALT, ALKPHOS, BILITOT, PROT, ALBUMIN in the last 168 hours. No results for input(s): LIPASE, AMYLASE in the last 168 hours. No results for input(s): AMMONIA in the last 168 hours.  Cardiac Enzymes:  Recent Labs Lab 08/30/15 0100  TROPONINI 0.03    BNP (last 3 results)  Recent Labs  08/30/15 0100  BNP 12.2    CBG:  Recent Labs Lab 08/30/15 1651 08/30/15 2106 08/31/15 0719 08/31/15 1135 08/31/15 1642  GLUCAP 125* 159* 116* 149* 91    Recent Results (from the past 240 hour(s))  Culture, expectorated sputum-assessment     Status: None   Collection Time: 08/31/15  7:24 AM  Result Value Ref Range Status   Specimen Description SPUTUM  Final   Special Requests NONE  Final   Sputum evaluation   Final    MICROSCOPIC FINDINGS SUGGEST THAT THIS SPECIMEN IS NOT REPRESENTATIVE OF LOWER RESPIRATORY SECRETIONS. PLEASE RECOLLECT. Gram Stain Report Called to,Read Back By and Verified With: G COOPER,RN AT [id number] 08/31/15 BY L BENFIELD    Report Status 08/31/2015 FINAL  Final     Studies: No results found.   Scheduled Meds: . colchicine  0.6 mg Oral Daily  . insulin aspart  0-15 Units Subcutaneous TID WC  . insulin aspart  0-5 Units Subcutaneous QHS  . insulin glargine  15 Units Subcutaneous QHS  . ipratropium-albuterol  3 mL Nebulization BID  . [START ON 09/01/2015] metoprolol succinate  25 mg Oral Daily  . rosuvastatin   10 mg Oral QHS  . sodium chloride flush  3 mL Intravenous Q12H  . [START ON 09/01/2015] warfarin  3 mg Oral Once per day on Tue Fri  . warfarin  5 mg Oral Once per day on Sun Mon Wed Thu Sat  . Warfarin - Pharmacist Dosing Inpatient   Does not apply q1800   Continuous Infusions:  PRN Meds: acetaminophen, albuterol, HYDROcodone-acetaminophen, polyethylene glycol  Time spent: 30 minutes  Author: Berle Mull, MD [facility] Pager: [phone] 08/31/2015 6:05 PM  If 7PM-7AM, please contact night-coverage at [email or web address], password [email or web address]

## 2015-09-01 DIAGNOSIS — I82431 Acute embolism and thrombosis of right popliteal vein: Secondary | ICD-10-CM | POA: Diagnosis not present

## 2015-09-01 DIAGNOSIS — D649 Anemia, unspecified: Secondary | ICD-10-CM | POA: Diagnosis not present

## 2015-09-01 DIAGNOSIS — N184 Chronic kidney disease, stage 4 (severe): Secondary | ICD-10-CM | POA: Diagnosis not present

## 2015-09-01 DIAGNOSIS — I10 Essential (primary) hypertension: Secondary | ICD-10-CM | POA: Diagnosis not present

## 2015-09-01 DIAGNOSIS — R0602 Shortness of breath: Secondary | ICD-10-CM | POA: Diagnosis not present

## 2015-09-01 DIAGNOSIS — Z853 Personal history of malignant neoplasm of breast: Secondary | ICD-10-CM

## 2015-09-01 LAB — BASIC METABOLIC PANEL
Anion gap: 10 (ref 5–15)
BUN: 34 mg/dL — ABNORMAL HIGH (ref 6–20)
CALCIUM: 9.3 mg/dL (ref 8.9–10.3)
CHLORIDE: 110 mmol/L (ref 101–111)
CO2: 19 mmol/L — AB (ref 22–32)
Creatinine, Ser: 2.1 mg/dL — ABNORMAL HIGH (ref 0.44–1.00)
GFR calc Af Amer: 27 mL/min — ABNORMAL LOW (ref >60)
GFR calc non Af Amer: 23 mL/min — ABNORMAL LOW (ref >60)
GLUCOSE: 156 mg/dL — AB (ref 65–99)
Potassium: 4.8 mmol/L (ref 3.5–5.1)
Sodium: 139 mmol/L (ref 135–145)

## 2015-09-01 LAB — GLUCOSE, CAPILLARY
Glucose-Capillary: 95 mg/dL (ref 65–99)
Glucose-Capillary: 154 mg/dL — ABNORMAL HIGH (ref 65–99)

## 2015-09-01 LAB — PROTIME-INR
INR: 2.27 — ABNORMAL HIGH (ref 0.00–1.49)
Prothrombin Time: 24.8 seconds — ABNORMAL HIGH (ref 11.6–15.2)

## 2015-09-01 LAB — CBC
HCT: 32.9 % — ABNORMAL LOW (ref 36.0–46.0)
Hemoglobin: 10.5 g/dL — ABNORMAL LOW (ref 12.0–15.0)
MCH: 29.9 pg (ref 26.0–34.0)
MCHC: 31.9 g/dL (ref 30.0–36.0)
MCV: 93.7 fL (ref 78.0–100.0)
PLATELETS: 189 10*3/uL (ref 150–400)
RBC: 3.51 MIL/uL — ABNORMAL LOW (ref 3.87–5.11)
RDW: 14.1 % (ref 11.5–15.5)
WBC: 3.7 10*3/uL — ABNORMAL LOW (ref 4.0–10.5)

## 2015-09-01 MED ORDER — ENOXAPARIN SODIUM 100 MG/ML ~~LOC~~ SOLN
100.0000 mg | Freq: Once | SUBCUTANEOUS | Status: AC
  Administered 2015-09-01: 100 mg via SUBCUTANEOUS
  Filled 2015-09-01: qty 1

## 2015-09-01 MED ORDER — METOPROLOL SUCCINATE ER 25 MG PO TB24: 25.0000 mg | ORAL_TABLET | Freq: Every day | ORAL | Status: DC

## 2015-09-01 MED ORDER — WARFARIN SODIUM 5 MG PO TABS: 5.0000 mg | ORAL_TABLET | Freq: Every day | ORAL | Status: DC

## 2015-09-01 MED ORDER — ZOLPIDEM TARTRATE 5 MG PO TABS
5.0000 mg | ORAL_TABLET | Freq: Once | ORAL | Status: AC
  Administered 2015-09-01: 5 mg via ORAL
  Filled 2015-09-01: qty 1

## 2015-09-01 MED ORDER — FUROSEMIDE 20 MG PO TABS: 20.0000 mg | ORAL_TABLET | Freq: Every day | ORAL | Status: DC

## 2015-09-01 NOTE — Discharge Summary (Signed)
[facility] Discharge Summary   Patient: Patricia Morgan [id number]   PCP: Kandice Hams, MD DOB: 09-Apr-1951   Date of admission: 08/30/2015   Date of discharge: 09/01/2015     Discharge Diagnoses:  Principal Problem:   Popliteal DVT (deep venous thrombosis) (HCC) Active Problems:   Diabetes mellitus ([place])   Benign essential HTN   ANEMIA-NOS   Obstructive sleep apnea   CKD (chronic kidney disease), stage IV (HCC)   BREAST CANCER, HX OF   Pulmonary embolism (HCC)   Obesity   Left ventricular systolic dysfunction, undermined duration  Recommendations for Outpatient Follow-up:  1. Follow-up with PCP in one week with CBC   Follow-up Information    Follow up with POLITE,RONALD D, MD. Schedule an appointment as soon as possible for a visit in 1 week.   Specialty:  Internal Medicine   Contact information:   [address] [place] [address] [phone]       Schedule an appointment as soon as possible for a visit on 09/04/2015 to follow up.   Why:  anticoagulation clinic,       Follow up with Collene Gobble., MD In 2 months.   Specialty:  Pulmonary Disease   Why:  for follow up and further work up   Contact information:   [address] [place] [address] [phone]       Follow up with [facility]. Call in 2 weeks.   Why:  if do not get a call back for follow up.   Contact information:   [address] [place] [phone]      Follow up with Collene Gobble., MD On 11/04/2015.   Specialty:  Pulmonary Disease   Why:  Appt 2:45 PM   Contact information:   [address] [address] [phone]      Diet recommendation: Diabetic diet  Activity: The patient is advised to gradually reintroduce usual activities.  Discharge Condition: good  History of present illness: As per the H and P dictated on admission, "Patricia Morgan is a very pleasant 65 y.o. female with  a past medical history that includes hypertension, diabetes, chronic kidney disease, rest cancer, recurrent DVT/PE on warfarin since to the emergency department with the chief complaint of sudden onset shortness of breath. Initial evaluation reveals a shin acute respiratory distress with subtherapeutic INR.  Information is obtained from the patient. She reports last evening she developed sudden onset of shortness of breath. Associated symptoms include slight headache and some dizziness with position change. She denies chest pain palpitations cough congestion or worsening lower extremity swelling. She does state "i was afraid to ly down". She denies visual disturbances syncope or near-syncope. She denies abdominal pain nausea vomiting diarrhea dysuria hematuria frequency or urgency. She denies any fever chills or recent sick contacts or travel. She does state that symptoms are quite reminiscent of when she had a PE in the past. Reports compliance with her Coumadin does state that her last visit with her PCP she was told her INR was 1.9 approximately 2 weeks ago. Of note she was hospitalized with acute PE 1 year ago and has had a DVT 5 years prior to that. One point she was on Eliquis but that was discontinued due to her kidney disease.  In the emergency department she is afebrile hemodynamically stable and not hypoxic. INR is 1.84"  Hospital Course:  Summary of her active problems in the hospital is  as following. 1. Popliteal DVT (deep venous thrombosis) (HCC) Due to her chronic kidney disease patient is not a candidate for new oral anticoagulants. Her INR remained therapeutic in the hospital but has remained subtherapeutic for last 2 months which is likely the cause of patient's recurrent DVT. Patient will be bridged with Lovenox for total of 2 days. Patient will also ideally need a hematology follow-up as an outpatient.  2. Cardiac myopathy with left ventricle systolic dysfunction. Discussion  fraction 45% with diffuse hypokinesis, inferolateral. No significant change in the EKG patient never had any chest pain. Patient denies having any prior cardiac workup or stress test or cardiac cath. Unaware of having any prior echogram as well. We change her amlodipine to Toprol-XL. Continue monitoring on telemetry. Patient would benefit from some diuretics as well. Discuss with cardiology and recommended an outpatient limited echocardiogram with contrast and then follow-up with cartilage for further workup.  3. Chronic kidney disease stage IV. Acute on chronic kidney injury. Patient presented with mild worsening of renal function he was given IV fluids overnight. Renal function is back to his baseline. Recommend to follow up with nephrology as an outpatient.  4. Type 2 diabetes mellitus. Diabetic nephropathy. Hemoglobin A1c 6.0 on admission. Well-controlled. We'll continue current management.  5. Groundglass opacities on CT scan of the chest. Pulmonary was consulted. Autoimmune workup was initiated and rheumatoid factor is a significantly elevated. At present does not appear to be any acute distress. Pulmonary recommends outpatient follow-up.  All other chronic medical condition were stable during the hospitalization.  Patient was ambulatory without any assistance. On the day of the discharge the patient's vitals were stable and oxygenation also normalized, and no other acute medical condition were reported by patient. the patient was felt safe to be discharge at home with family.  Procedures and Results:  Echocardiogram  Vascular Doppler   Consultations:  Pulmonary  DISCHARGE MEDICATION: Discharge Medication List as of 09/01/2015  2:32 PM    START taking these medications   Details  metoprolol succinate (TOPROL-XL) 25 MG 24 hr tablet Take 1 tablet (25 mg total) by mouth daily., Starting 09/01/2015, Until Discontinued, Normal      CONTINUE these medications which have  CHANGED   Details  furosemide (LASIX) 20 MG tablet Take 1 tablet (20 mg total) by mouth daily., Starting 09/01/2015, Until Discontinued, Normal    warfarin (COUMADIN) 5 MG tablet Take 1 tablet (5 mg total) by mouth daily at 6 PM. Till next appointment on 09/04/2015 with coumadin clinic., Starting 09/01/2015, Until Discontinued, No Print      CONTINUE these medications which have NOT CHANGED   Details  acetaminophen (TYLENOL) 500 MG tablet Take 500 mg by mouth every 6 (six) hours as needed for mild pain., Until Discontinued, Historical Med    albuterol (PROAIR HFA) 108 (90 BASE) MCG/ACT inhaler Inhale 1-2 puffs into the lungs every 6 (six) hours as needed for wheezing or shortness of breath., Starting 02/07/2014, Until Discontinued, Fax    allopurinol (ZYLOPRIM) 100 MG tablet Take 100 mg by mouth daily., Until Discontinued, Historical Med    colchicine 0.6 MG tablet Take 0.6 mg by mouth daily. , Starting 07/13/2014, Until Discontinued, Historical Med    Insulin Glargine (LANTUS SOLOSTAR) 100 UNIT/ML Solostar Pen Inject 15 Units into the skin 2 (two) times daily. Per sliding scale., Until Discontinued, Historical Med    insulin lispro (HUMALOG KWIKPEN) 100 UNIT/ML KiwkPen Inject 10 Units into the skin 3 (three) times daily., Until Discontinued, Historical  Med    rosuvastatin (CRESTOR) 10 MG tablet Take 10 mg by mouth daily., Until Discontinued, Historical Med      STOP taking these medications     amLODipine (NORVASC) 10 MG tablet      lisinopril (PRINIVIL,ZESTRIL) 40 MG tablet        No Known Allergies Discharge Instructions    Diet - low sodium heart healthy    Complete by:  As directed      Discharge instructions    Complete by:  As directed   It is important that you read following instructions as well as go over your medication list with RN to help you understand your care after this hospitalization.  Discharge Instructions: Please follow-up with PCP in one week with CBC  bloodwork Please follow up with coumadin clinic on Friday.  Please follow up with cardiology as scheduled Please follow up with pulmonary as scheduled.  Please request your primary care physician to go over all Hospital Tests and Procedure/Radiological results at the follow up,  Please get all Hospital records sent to your PCP by signing hospital release before you go home.   Do not take more than prescribed Pain, Sleep and Anxiety Medications. You were cared for by a hospitalist during your hospital stay. If you have any questions about your discharge medications or the care you received while you were in the hospital after you are discharged, you can call the unit and ask to speak with the hospitalist on call if the hospitalist that took care of you is not available.  Once you are discharged, your primary care physician will handle any further medical issues. Please note that NO REFILLS for any discharge medications will be authorized once you are discharged, as it is imperative that you return to your primary care physician (or establish a relationship with a primary care physician if you do not have one) for your aftercare needs so that they can reassess your need for medications and monitor your lab values. You Must read complete instructions/literature along with all the possible adverse reactions/side effects for all the Medicines you take and that have been prescribed to you. Take any new Medicines after you have completely understood and accept all the possible adverse reactions/side effects. Wear Seat belts while driving. If you have smoked or chewed Tobacco in the last 2 yrs please stop smoking and/or stop any Recreational drug use.     Increase activity slowly    Complete by:  As directed           Discharge Exam: Filed Weights   08/30/15 0622 08/31/15 0355 09/01/15 0611  Weight: 100.517 kg (221 lb 9.6 oz) 100.653 kg (221 lb 14.4 oz) 100.653 kg (221 lb 14.4 oz)   Filed Vitals:     09/01/15 0613 09/01/15 1021  BP: 139/79 139/79  Pulse: 89 80  Temp:    Resp: 18    General: Appear in no distress, no Rash; Oral Mucosa moist. Cardiovascular: S1 and S2 Present, no Murmur, no JVD Respiratory: Bilateral Air entry present and faint basal Crackles, no wheezes Abdomen: Bowel Sound present, Soft and no tenderness Extremities: no Pedal edema, no calf tenderness Neurology: Grossly no focal neuro deficit.  The results of significant diagnostics from this hospitalization (including imaging, microbiology, ancillary and laboratory) are listed below for reference.    Significant Diagnostic Studies: Dg Chest 2 View  08/30/2015  CLINICAL DATA:  Acute onset of shortness of breath and slight headache. Initial encounter. EXAM: CHEST  2 VIEW COMPARISON:  Chest radiograph performed 09/22/2014 FINDINGS: The lungs are hypoexpanded. Mild bibasilar atelectasis is noted. There is no evidence of pleural effusion or pneumothorax. The heart is borderline normal in size. No acute osseous abnormalities are seen. IMPRESSION: Lungs hypoexpanded, with mild bibasilar atelectasis. Electronically Signed   By: Garald Balding M.D.   On: 08/30/2015 01:52   Ct Chest Wo Contrast  08/30/2015  CLINICAL DATA:  65 year old female with history of pulmonary embolism. Shortness of breath. No current chest pain. EXAM: CT CHEST WITHOUT CONTRAST TECHNIQUE: Multidetector CT imaging of the chest was performed following the standard protocol without IV contrast. COMPARISON:  No priors. FINDINGS: Mediastinum/Lymph Nodes: Heart size is borderline enlarged. There is no significant pericardial fluid, thickening or pericardial calcification. There is atherosclerosis of the thoracic aorta, the great vessels of the mediastinum and the coronary arteries, including calcified atherosclerotic plaque in the left circumflex and right coronary arteries. No pathologically enlarged mediastinal, internal mammary or hilar lymph nodes. Please note  that accurate exclusion of hilar adenopathy is limited on noncontrast CT scans. Esophagus is unremarkable in appearance. No axillary lymphadenopathy surgical clips in the right axilla from prior lymph node dissection. Lungs/Pleura: Mild diffuse bronchial thickening with diffuse centrilobular ground-glass attenuation micro nodularity throughout all aspects of the lungs bilaterally. No confluent consolidative airspace disease. No pleural effusions. No larger more suspicious appearing pulmonary nodules or masses are otherwise noted. Upper Abdomen: Heterogeneous areas of low attenuation throughout the hepatic parenchyma, compatible with hepatic steatosis. Multiple low-attenuation renal lesions are noted in the right kidney, incompletely characterized on today's noncontrast CT examination, but statistically likely to represent cysts, with the largest of these lesions measuring up to 6.3 cm in the lower pole of the right kidney. Left kidney is not visualized and may be surgically absent. Musculoskeletal/Soft Tissues: Status post right modified radical mastectomy. There are no aggressive appearing lytic or blastic lesions noted in the visualized portions of the skeleton. IMPRESSION: 1. Diffuse centrilobular ground-glass attenuation nodularity throughout the lungs bilaterally. This is nonspecific. Differential considerations include hypersensitivity pneumonitis, acute atypical infection or pulmonary vasculitis. 2. Atherosclerosis, including two vessel coronary artery disease. Please note that although the presence of coronary artery calcium documents the presence of coronary artery disease, the severity of this disease and any potential stenosis cannot be assessed on this non-gated CT examination. Assessment for potential risk factor modification, dietary therapy or pharmacologic therapy may be warranted, if clinically indicated. 3. Additional incidental findings, as above. Electronically Signed   By: Vinnie Langton M.D.    On: 08/30/2015 14:10    Microbiology: Recent Results (from the past 240 hour(s))  Culture, expectorated sputum-assessment     Status: None   Collection Time: 08/31/15  7:24 AM  Result Value Ref Range Status   Specimen Description SPUTUM  Final   Special Requests NONE  Final   Sputum evaluation   Final    MICROSCOPIC FINDINGS SUGGEST THAT THIS SPECIMEN IS NOT REPRESENTATIVE OF LOWER RESPIRATORY SECRETIONS. PLEASE RECOLLECT. Gram Stain Report Called to,Read Back By and Verified With: G COOPER,RN AT [id number] 08/31/15 BY L BENFIELD    Report Status 08/31/2015 FINAL  Final     Labs: CBC:  Recent Labs Lab 08/30/15 0100 08/31/15 0319 09/01/15 0351  WBC 4.7 3.7* 3.7*  NEUTROABS 1.9  --   --   HGB 12.1 10.4* 10.5*  HCT 35.8* 32.0* 32.9*  MCV 93.0 93.0 93.7  PLT 230 191 [id number]   Basic Metabolic Panel:  Recent Labs Lab 08/30/15  0100 08/31/15 0319 09/01/15 0351  NA 138 142 139  K 4.1 4.6 4.8  CL 108 113* 110  CO2 23 22 19*  GLUCOSE 150* 110* 156*  BUN 46* 36* 34*  CREATININE 2.81* 2.27* 2.10*  CALCIUM 9.7 9.0 9.3   Liver Function Tests: No results for input(s): AST, ALT, ALKPHOS, BILITOT, PROT, ALBUMIN in the last 168 hours. No results for input(s): LIPASE, AMYLASE in the last 168 hours. No results for input(s): AMMONIA in the last 168 hours. Cardiac Enzymes:  Recent Labs Lab 08/30/15 0100  TROPONINI 0.03   BNP (last 3 results)  Recent Labs  08/30/15 0100  BNP 12.2   CBG:  Recent Labs Lab 08/31/15 1135 08/31/15 1642 08/31/15 2127 09/01/15 0720 09/01/15 1121  GLUCAP 149* 91 122* 95 154*   Time spent: 30 minutes  Signed:  PATEL, PRANAV  [facility] 09/01/2015 , 8:03 PM

## 2015-09-01 NOTE — Plan of Care (Signed)
Problem: Pain Managment: Goal: General experience of comfort will improve Outcome: Not Applicable Date Met:  63/84/66 Patient has denied pain.

## 2015-09-01 NOTE — Progress Notes (Signed)
ANTICOAGULATION CONSULT NOTE - Follow Up Consult  Pharmacy Consult for Lovenox and Coumadin Indication: hx recurrent PE and DVT  No Known Allergies  Patient Measurements: Height: 5\' 2"  (157.5 cm) Weight: 221 lb 14.4 oz (100.653 kg) IBW/kg (Calculated) : 50.1   Vital Signs: Temp: 98.2 F (36.8 C) (04/18 0612) Temp Source: Oral (04/18 0612) BP: 139/79 mmHg (04/18 0613) Pulse Rate: 89 (04/18 0613)  Labs:  Recent Labs  08/30/15 0100 08/30/15 1238 08/30/15 2238 08/31/15 0319 09/01/15 0351  HGB 12.1  --   --  10.4* 10.5*  HCT 35.8*  --   --  32.0* 32.9*  PLT 230  --   --  191 189  LABPROT 21.2*  --   --  25.1* 24.8*  INR 1.84*  --   --  2.31* 2.27*  HEPARINUNFRC  --  0.84* 0.63 0.68  --   CREATININE 2.81*  --   --  2.27* 2.10*  TROPONINI 0.03  --   --   --   --     Estimated Creatinine Clearance: 29.2 mL/min (by C-G formula based on Cr of 2.1).  Assessment:   On Coumadin prior to admission for hx recurrent PE and DVT.  New right leg DVT per dopplers 4/17.   INR only 1.84 on admit, but up to 2.31 on 4/17 and 2.27 today.   Heparin drip changed to Lovenox on 4/17 for bridge.       Home Coumadin regimen: 3 mg MWF, 5 mg TTSS.   Patient reported outpt INR 1.9 a few weeks ago and next appointment planned for 09/21/15.   She also reports that her INR has fluctuated since she resumed Coumadin in May 2016 after recurrent PE.  Goal of Therapy:  INR 2-3 Anti-Xa level 0.6-1 units/ml 4hrs after LMWH dose given Monitor platelets by anticoagulation protocol: Yes   Plan:   Lovenox 100 mg SQ x 1 again this am for 48 hr overlap with INR >2  Suggest Coumadin 5 mg daily for now, to try to avoid INR falling below 2.  Discussed with patient and Dr. Posey Pronto  Would check INR later this week to be sure INR remains >2.  Arty Baumgartner, [place] Pager: [phone] 09/01/2015,10:20 AM

## 2015-09-01 NOTE — Plan of Care (Signed)
Problem: Education: Goal: Knowledge of [facility] Education information/materials will improve Outcome: Progressing Patient able to explain to RN why she is taking Crestor.  Patient aware of plan of care.  Patient denied pain thus far this shift.  RN instructed patient to notify staff if patient started experiencing any pain.  Patient stated understanding.

## 2015-09-03 ENCOUNTER — Telehealth: Payer: Self-pay | Admitting: Cardiovascular Disease

## 2015-09-04 ENCOUNTER — Telehealth: Payer: Self-pay | Admitting: Pharmacist

## 2015-09-04 ENCOUNTER — Ambulatory Visit (INDEPENDENT_AMBULATORY_CARE_PROVIDER_SITE_OTHER): Payer: Commercial Managed Care - HMO | Admitting: Pharmacist

## 2015-09-04 DIAGNOSIS — I82431 Acute embolism and thrombosis of right popliteal vein: Secondary | ICD-10-CM

## 2015-09-04 DIAGNOSIS — Z5181 Encounter for therapeutic drug level monitoring: Secondary | ICD-10-CM | POA: Diagnosis not present

## 2015-09-04 DIAGNOSIS — I2699 Other pulmonary embolism without acute cor pulmonale: Secondary | ICD-10-CM

## 2015-09-04 LAB — POCT INR: INR: 2

## 2015-09-04 NOTE — Telephone Encounter (Signed)
Received verbal approval from Dr. Lamonte Sakai to increase INR range to 2.5-3.5 given hx of 2 DVTs and most recently PE while on Coumadin. Will update INR range at next Coumadin check in 1 week.

## 2015-09-04 NOTE — Telephone Encounter (Signed)
Closed encounter °

## 2015-09-07 ENCOUNTER — Other Ambulatory Visit (HOSPITAL_COMMUNITY)
Admission: RE | Admit: 2015-09-07 | Discharge: 2015-09-07 | Disposition: A | Discharge status: Home / Self Care | Payer: Commercial Managed Care - HMO | Source: Ambulatory Visit | Attending: Obstetrics and Gynecology | Admitting: Obstetrics and Gynecology

## 2015-09-07 ENCOUNTER — Other Ambulatory Visit: Payer: Self-pay | Admitting: Obstetrics & Gynecology

## 2015-09-07 DIAGNOSIS — R938 Abnormal findings on diagnostic imaging of other specified body structures: Secondary | ICD-10-CM | POA: Diagnosis not present

## 2015-09-07 DIAGNOSIS — I82401 Acute embolism and thrombosis of unspecified deep veins of right lower extremity: Secondary | ICD-10-CM | POA: Diagnosis not present

## 2015-09-07 DIAGNOSIS — I10 Essential (primary) hypertension: Secondary | ICD-10-CM | POA: Diagnosis not present

## 2015-09-07 DIAGNOSIS — I2699 Other pulmonary embolism without acute cor pulmonale: Secondary | ICD-10-CM | POA: Diagnosis not present

## 2015-09-07 DIAGNOSIS — I5020 Unspecified systolic (congestive) heart failure: Secondary | ICD-10-CM | POA: Diagnosis not present

## 2015-09-07 DIAGNOSIS — E663 Overweight: Secondary | ICD-10-CM | POA: Diagnosis not present

## 2015-09-07 DIAGNOSIS — C50911 Malignant neoplasm of unspecified site of right female breast: Secondary | ICD-10-CM | POA: Diagnosis not present

## 2015-09-07 DIAGNOSIS — Z124 Encounter for screening for malignant neoplasm of cervix: Secondary | ICD-10-CM | POA: Insufficient documentation

## 2015-09-07 DIAGNOSIS — N183 Chronic kidney disease, stage 3 (moderate): Secondary | ICD-10-CM | POA: Diagnosis not present

## 2015-09-07 DIAGNOSIS — A630 Anogenital (venereal) warts: Secondary | ICD-10-CM | POA: Diagnosis not present

## 2015-09-07 DIAGNOSIS — G4730 Sleep apnea, unspecified: Secondary | ICD-10-CM | POA: Diagnosis not present

## 2015-09-09 LAB — CYTOLOGY - PAP

## 2015-09-11 ENCOUNTER — Ambulatory Visit (INDEPENDENT_AMBULATORY_CARE_PROVIDER_SITE_OTHER): Payer: Commercial Managed Care - HMO | Admitting: *Deleted

## 2015-09-11 DIAGNOSIS — I2699 Other pulmonary embolism without acute cor pulmonale: Secondary | ICD-10-CM | POA: Diagnosis not present

## 2015-09-11 DIAGNOSIS — I82431 Acute embolism and thrombosis of right popliteal vein: Secondary | ICD-10-CM

## 2015-09-11 DIAGNOSIS — Z5181 Encounter for therapeutic drug level monitoring: Secondary | ICD-10-CM

## 2015-09-11 LAB — POCT INR: INR: 2.3

## 2015-09-18 ENCOUNTER — Ambulatory Visit (INDEPENDENT_AMBULATORY_CARE_PROVIDER_SITE_OTHER): Payer: Commercial Managed Care - HMO | Admitting: *Deleted

## 2015-09-18 DIAGNOSIS — C50911 Malignant neoplasm of unspecified site of right female breast: Secondary | ICD-10-CM | POA: Diagnosis not present

## 2015-09-18 DIAGNOSIS — I2699 Other pulmonary embolism without acute cor pulmonale: Secondary | ICD-10-CM

## 2015-09-18 DIAGNOSIS — I82431 Acute embolism and thrombosis of right popliteal vein: Secondary | ICD-10-CM | POA: Diagnosis not present

## 2015-09-18 DIAGNOSIS — Z5181 Encounter for therapeutic drug level monitoring: Secondary | ICD-10-CM

## 2015-09-18 LAB — POCT INR: INR: 4.2

## 2015-09-19 DIAGNOSIS — G4733 Obstructive sleep apnea (adult) (pediatric): Secondary | ICD-10-CM | POA: Diagnosis not present

## 2015-09-21 ENCOUNTER — Telehealth: Payer: Self-pay | Admitting: Cardiovascular Disease

## 2015-09-21 ENCOUNTER — Ambulatory Visit: Payer: Commercial Managed Care - HMO | Admitting: Cardiovascular Disease

## 2015-09-25 ENCOUNTER — Ambulatory Visit (INDEPENDENT_AMBULATORY_CARE_PROVIDER_SITE_OTHER): Payer: Commercial Managed Care - HMO | Admitting: Pharmacist

## 2015-09-25 DIAGNOSIS — I2699 Other pulmonary embolism without acute cor pulmonale: Secondary | ICD-10-CM

## 2015-09-25 DIAGNOSIS — Z5181 Encounter for therapeutic drug level monitoring: Secondary | ICD-10-CM

## 2015-09-25 DIAGNOSIS — I82431 Acute embolism and thrombosis of right popliteal vein: Secondary | ICD-10-CM | POA: Diagnosis not present

## 2015-09-25 LAB — POCT INR: INR: 3

## 2015-09-25 NOTE — Telephone Encounter (Signed)
Closed Encounter  °

## 2015-10-01 ENCOUNTER — Ambulatory Visit: Payer: Commercial Managed Care - HMO | Admitting: Oncology

## 2015-10-02 ENCOUNTER — Ambulatory Visit (INDEPENDENT_AMBULATORY_CARE_PROVIDER_SITE_OTHER): Payer: Commercial Managed Care - HMO | Admitting: *Deleted

## 2015-10-02 DIAGNOSIS — I2699 Other pulmonary embolism without acute cor pulmonale: Secondary | ICD-10-CM | POA: Diagnosis not present

## 2015-10-02 DIAGNOSIS — I82431 Acute embolism and thrombosis of right popliteal vein: Secondary | ICD-10-CM | POA: Diagnosis not present

## 2015-10-02 DIAGNOSIS — Z5181 Encounter for therapeutic drug level monitoring: Secondary | ICD-10-CM | POA: Diagnosis not present

## 2015-10-02 LAB — POCT INR: INR: 3.7

## 2015-10-05 ENCOUNTER — Other Ambulatory Visit (HOSPITAL_BASED_OUTPATIENT_CLINIC_OR_DEPARTMENT_OTHER): Payer: Commercial Managed Care - HMO

## 2015-10-05 ENCOUNTER — Ambulatory Visit: Payer: Commercial Managed Care - HMO

## 2015-10-05 ENCOUNTER — Other Ambulatory Visit: Payer: Self-pay | Admitting: *Deleted

## 2015-10-05 ENCOUNTER — Ambulatory Visit (HOSPITAL_BASED_OUTPATIENT_CLINIC_OR_DEPARTMENT_OTHER): Payer: Commercial Managed Care - HMO | Admitting: Hematology & Oncology

## 2015-10-05 ENCOUNTER — Encounter: Payer: Self-pay | Admitting: Hematology & Oncology

## 2015-10-05 VITALS — BP 157/85 | HR 84 | Temp 98.6°F | Resp 16 | Ht 62.0 in | Wt 223.0 lb

## 2015-10-05 DIAGNOSIS — I2699 Other pulmonary embolism without acute cor pulmonale: Secondary | ICD-10-CM

## 2015-10-05 DIAGNOSIS — Z853 Personal history of malignant neoplasm of breast: Secondary | ICD-10-CM | POA: Diagnosis not present

## 2015-10-05 DIAGNOSIS — I749 Embolism and thrombosis of unspecified artery: Secondary | ICD-10-CM

## 2015-10-05 DIAGNOSIS — I429 Cardiomyopathy, unspecified: Secondary | ICD-10-CM

## 2015-10-05 LAB — CBC WITH DIFFERENTIAL (CANCER CENTER ONLY)
BASO#: 0 10*3/uL (ref 0.0–0.2)
BASO%: 0.5 % (ref 0.0–2.0)
EOS%: 2.6 % (ref 0.0–7.0)
Eosinophils Absolute: 0.1 10*3/uL (ref 0.0–0.5)
HEMATOCRIT: 35.1 % (ref 34.8–46.6)
HEMOGLOBIN: 12 g/dL (ref 11.6–15.9)
LYMPH#: 1.7 10*3/uL (ref 0.9–3.3)
LYMPH%: 40.6 % (ref 14.0–48.0)
MCH: 31.5 pg (ref 26.0–34.0)
MCHC: 34.2 g/dL (ref 32.0–36.0)
MCV: 92 fL (ref 81–101)
MONO#: 0.4 10*3/uL (ref 0.1–0.9)
MONO%: 9.7 % (ref 0.0–13.0)
NEUT%: 46.6 % (ref 39.6–80.0)
NEUTROS ABS: 2 10*3/uL (ref 1.5–6.5)
Platelets: 222 10*3/uL (ref 145–400)
RBC: 3.81 10*6/uL (ref 3.70–5.32)
RDW: 13.6 % (ref 11.1–15.7)
WBC: 4.2 10*3/uL (ref 3.9–10.0)

## 2015-10-05 LAB — COMPREHENSIVE METABOLIC PANEL (CC13)
ALBUMIN: 3.8 g/dL (ref 3.6–4.8)
ALK PHOS: 118 IU/L — AB (ref 39–117)
ALT: 19 IU/L (ref 0–32)
AST: 24 IU/L (ref 0–40)
Albumin/Globulin Ratio: 1.2 (ref 1.2–2.2)
BILIRUBIN TOTAL: 0.3 mg/dL (ref 0.0–1.2)
BUN / CREAT RATIO: 13 (ref 12–28)
BUN: 27 mg/dL (ref 8–27)
CHLORIDE: 105 mmol/L (ref 96–106)
CO2: 24 mmol/L (ref 18–29)
CREATININE: 2.14 mg/dL — AB (ref 0.57–1.00)
Calcium, Ser: 9.6 mg/dL (ref 8.7–10.3)
GFR calc Af Amer: 27 mL/min/{1.73_m2} — ABNORMAL LOW (ref >59)
GFR calc non Af Amer: 23 mL/min/{1.73_m2} — ABNORMAL LOW (ref >59)
GLUCOSE: 150 mg/dL — AB (ref 65–99)
Globulin, Total: 3.1 g/dL (ref 1.5–4.5)
Potassium, Ser: 4.1 mmol/L (ref 3.5–5.2)
Sodium: 137 mmol/L (ref 134–144)
Total Protein: 6.9 g/dL (ref 6.0–8.5)

## 2015-10-05 MED ORDER — APIXABAN 5 MG PO TABS: 5.0000 mg | ORAL_TABLET | Freq: Two times a day (BID) | ORAL | Status: DC

## 2015-10-05 NOTE — Progress Notes (Signed)
Referral MD  Reason for Referral: Recurrent thromboembolic disease. History of locally recurrent ductal carcinoma of the right breast-status post mastectomy in 1995   Chief Complaint  Patient presents with  . Other    New Patient  : I want to know why he having blood clots.  HPI: Ms. Suite is a very youthful appearing 65 year old African-American female. Of interest, she has a history of stage II breast cancer of the right breast. This was back in 1994. I think she was living in [place]. She said that she had chemotherapy. She had a lumpectomy. She did not have radiation therapy.  She's had a year later, the cancer recurred and the breast. She had a mastectomy.  I have to assume that her cancer was triple negative or at least ER negative since she never received hormonal therapy.  She developed her first blood clot. This was back in 2011. She has diabetes. She has chronic renal insufficiency. She could not have a CT scan but a VQ scan showed high probability. She was with shortness of breath. She had no leg swelling . She was placed onto Coumadin.  I am not sure how long she was on Coumadin.  In 2014, she had a thrombus in the left greater saphenous vein. I think she is on aspirin.  In May 2016, she had a thrombus in the right leg.  Somehow, they did not think that she should go ELIQUIS because of her renal insufficiency. They decided that she should go back onto Coumadin.  Her Coumadin has been sub-therapeutic. She developed a another clot. This was in her leg.  Of note, she does have some cardiomyopathy. I suspect this might be from past chemotherapy. Her ejection fraction was 45%. She is seeing a cardiologist in a week or so.  She was referred to the [facility] for evaluation and management.  She looks quite good. She feels okay. She still is short of breath. This might be from cardiomyopathy and not from past blood clot. Because of her renal  insufficiency, she cannot have a CT angiogram. She did have a noncontrast CT scan done with the last admission.  This showed some groundglass nodularity throughout the lungs bilaterally.  Her blood sugars seem to be doing fairly well. She is on insulin. Thereafter I think she also has some sleep apnea.  She's had no formal hypercoagulable workup. She has had 2 miscarriages.  She has 6 sisters. There is no history of breast cancer in her family. She never has any genetic studies for breast cancer.  She has had no bleeding. She has had occasional leg swelling.  She gets her mammograms yearly.  Currently, [name].     Past Medical History  Diagnosis Date  . OBSTRUCTIVE SLEEP APNEA     CPAP  . PULMONARY EMBOLISM 09/24/2008    anticoag thru 03/2010  . Proteinuria   . BREAST CANCER, HX OF 1995    Right Breast  . ANEMIA-NOS   . DIABETES MELLITUS, TYPE II   . HYPERLIPIDEMIA   . HYPERTENSION   . CHRONIC KIDNEY DISEASE STAGE III (MODERATE)   :  Past Surgical History  Procedure Laterality Date  . Mastectomy  1995    Right  . Insertion port a cath    . Port-a-cath removal    :   Current outpatient prescriptions:  .  acetaminophen (TYLENOL) 500 MG tablet, Take 500 mg by mouth every 6 (six) hours as  needed for mild pain., Disp: , Rfl:  .  allopurinol (ZYLOPRIM) 100 MG tablet, Take 100 mg by mouth daily., Disp: , Rfl:  .  colchicine 0.6 MG tablet, Take 0.6 mg by mouth daily. , Disp: , Rfl: 1 .  furosemide (LASIX) 20 MG tablet, Take 1 tablet (20 mg total) by mouth daily., Disp: 30 tablet, Rfl: 0 .  Insulin Glargine (LANTUS SOLOSTAR) 100 UNIT/ML Solostar Pen, Inject 15 Units into the skin 2 (two) times daily. Per sliding scale., Disp: , Rfl:  .  insulin lispro (HUMALOG KWIKPEN) 100 UNIT/ML KiwkPen, Inject 10 Units into the skin 3 (three) times daily., Disp: , Rfl:  .  metoprolol succinate (TOPROL-XL) 25 MG 24 hr tablet, Take 1 tablet (25 mg total) by mouth  daily., Disp: 30 tablet, Rfl: 0 .  rosuvastatin (CRESTOR) 10 MG tablet, Take 10 mg by mouth daily., Disp: , Rfl:  .  warfarin (COUMADIN) 5 MG tablet, Take 1 tablet (5 mg total) by mouth daily at 6 PM. Till next appointment on 09/04/2015 with coumadin clinic. (Patient taking differently: Take 5 mg by mouth daily at 6 PM. Tues, Wed, Thurs, Sat, Sun 5mg        Mon and Frid 5.5mg ), Disp: , Rfl:  .  apixaban (ELIQUIS) 5 MG TABS tablet, Take 1 tablet (5 mg total) by mouth 2 (two) times daily., Disp: 60 tablet, Rfl: 6 .  [DISCONTINUED] fluticasone (FLONASE) 50 MCG/ACT nasal spray, Place 2 sprays into the nose daily., Disp: 16 g, Rfl: 2:  :  No Known Allergies:  Family History  Problem Relation Age of Onset  . Diabetes Sister   :  Social History   Social History  . Marital Status: Divorced    Spouse Name: N/A  . Number of Children: N/A  . Years of Education: 12   Occupational History  . Not on file.   Social History Main Topics  . Smoking status: Never Smoker   . Smokeless tobacco: Not on file     Comment: Lives alone-divorced  . Alcohol Use: No  . Drug Use: No  . Sexual Activity: Not on file   Other Topics Concern  . Not on file   Social History Narrative  :  Pertinent items are noted in HPI.  Exam: @IPVITALS @ Obese white female in no obvious distress. Vital signs show a temperature of 98.6. Pulse 84. Blood pressure 157/85. Weight is 223 pounds. Head and neck exam shows no ocular or oral lesions. She has no palpable cervical or supraclavicular lymph nodes. Lungs are clear bilaterally. Cardiac exam regular rate and rhythm with no murmurs, rubs or bruits. Abdomen is soft. She has good bowel sounds. There is no fluid wave. There is a palpable liver or spleen tip. Breast exam shows left breast no masses, edema or erythema. There is no left axillary adenopathy. Right chest wall shows well-healed mastectomy. There is no right chest wall masses. There is no right chest wall edema. There  is no right axillary adenopathy. Extremities shows some chronic lymphedema of the right arm. Lower extremities shows chronic mild nonpitting edema of the lower legs. No venous cord is noted. She has negative Homans sign. Skin exam shows no rashes, ecchymoses or petechia. Neurological exam shows no focal neurological deficits.    Recent Labs  10/05/15 1405  WBC 4.2  HGB 12.0  HCT 35.1  PLT 222   No results for input(s): NA, K, CL, CO2, GLUCOSE, BUN, CREATININE, CALCIUM in the last 72 hours.  Blood  smear review:  None  Pathology: None     Assessment and Plan:  Ms. Chop is a charming 37 showed African-American female. She has obesity, diabetes, sleep apnea. She has a history of locally recurrent breast cancer the right breast. I have to believe that she has had Adriamycin. She is have some cardiomyopathy.  I'm not sure we will find a reason why she's had these recurrent blood clots. She has had a couple miscarriages so she might have some hypercoagulable state.  I really do not see any reason why she cannot be on ELIQUIS. ELIQUIS is made specifically for issues with renal insufficiency. That is the beauty of ELIQUIS. I don't see any reason why she cannot be on ELIQUIS.  I talked her about this. She is very excited to try to go on ELIQUIS. I think 5 mg a day would be appropriate for her. I didelphic she needs a bolus dose.  I do think that she needs to have a genetic study for breast cancer. She had breast cancer when she was 65 years old. I suspect that this was triple negative. I think that given the fact that she has a daughter, I think a genetic study would be helpful. She is in agreement.  We cannot do any CT angiograms for the chest. I'm sure what the noncontrast CT scan was showing that was done recently.  I would like to see her back in about 1 month or so.  I gave her a prayer blanket. She has a very strong faith.  I spent about 45 minutes with her. It was fun talking to  her about [place].

## 2015-10-06 LAB — PROTEIN S, TOTAL: PROTEIN S AG TOTAL: 60 % (ref 60–150)

## 2015-10-06 LAB — PROTEIN C ACTIVITY: PROTEIN C ACTIVITY: 66 % — AB (ref 73–180)

## 2015-10-06 LAB — PROTEIN S ACTIVITY: Protein S-Functional: 27 % — ABNORMAL LOW (ref 63–140)

## 2015-10-06 LAB — ANTITHROMBIN III: Antithrombin Activity: 103 % (ref 75–135)

## 2015-10-07 LAB — LUPUS ANTICOAGULANT PANEL
DRVVT MIX: 44.2 s (ref 0.0–47.0)
DRVVT: 78.5 s — AB (ref 0.0–47.0)
HEXAGONAL PHASE PHOSPHOLIPID: 9 s (ref 0–11)
PTT-LA Mix: 43.6 s — ABNORMAL HIGH (ref 0.0–40.6)
PTT-LA: 59.7 s — ABNORMAL HIGH (ref 0.0–43.6)

## 2015-10-07 LAB — CARDIOLIPIN ANTIBODIES, IGG, IGM, IGA
Anticardiolipin Ab,IgG,Qn: <9 GPL U/mL (ref 0–14)
Anticardiolipin Ab,IgM,Qn: <9 MPL U/mL (ref 0–12)

## 2015-10-07 LAB — PROTEIN C, TOTAL: PROTEIN C ANTIGEN: 63 % (ref 60–150)

## 2015-10-07 LAB — BETA-2-GLYCOPROTEIN I ABS, IGG/M/A
Beta-2 Glyco 1 IgA: <9 GPI IgA units (ref 0–25)
Beta-2 Glycoprotein I Ab, IgG: <9 GPI IgG units (ref 0–20)

## 2015-10-08 LAB — PROTHROMBIN GENE MUTATION

## 2015-10-09 ENCOUNTER — Encounter: Payer: Self-pay | Admitting: Cardiovascular Disease

## 2015-10-09 ENCOUNTER — Ambulatory Visit (INDEPENDENT_AMBULATORY_CARE_PROVIDER_SITE_OTHER): Payer: Commercial Managed Care - HMO | Admitting: Pharmacist

## 2015-10-09 ENCOUNTER — Ambulatory Visit (INDEPENDENT_AMBULATORY_CARE_PROVIDER_SITE_OTHER): Payer: Commercial Managed Care - HMO | Admitting: Cardiovascular Disease

## 2015-10-09 VITALS — BP 120/88 | HR 82 | Ht 62.0 in | Wt 222.0 lb

## 2015-10-09 DIAGNOSIS — I82403 Acute embolism and thrombosis of unspecified deep veins of lower extremity, bilateral: Secondary | ICD-10-CM

## 2015-10-09 DIAGNOSIS — R0600 Dyspnea, unspecified: Secondary | ICD-10-CM

## 2015-10-09 DIAGNOSIS — Z5181 Encounter for therapeutic drug level monitoring: Secondary | ICD-10-CM | POA: Diagnosis not present

## 2015-10-09 DIAGNOSIS — I10 Essential (primary) hypertension: Secondary | ICD-10-CM | POA: Diagnosis not present

## 2015-10-09 DIAGNOSIS — I82431 Acute embolism and thrombosis of right popliteal vein: Secondary | ICD-10-CM

## 2015-10-09 DIAGNOSIS — I2699 Other pulmonary embolism without acute cor pulmonale: Secondary | ICD-10-CM

## 2015-10-09 DIAGNOSIS — I5042 Chronic combined systolic (congestive) and diastolic (congestive) heart failure: Secondary | ICD-10-CM | POA: Diagnosis not present

## 2015-10-09 DIAGNOSIS — G4733 Obstructive sleep apnea (adult) (pediatric): Secondary | ICD-10-CM

## 2015-10-09 DIAGNOSIS — E669 Obesity, unspecified: Secondary | ICD-10-CM

## 2015-10-09 DIAGNOSIS — N184 Chronic kidney disease, stage 4 (severe): Secondary | ICD-10-CM

## 2015-10-09 LAB — POCT INR: INR: 2.9

## 2015-10-09 NOTE — Patient Instructions (Signed)
Your physician has requested that you have a lexiscan myoview. For further information please visit [email or web address]. Please follow instruction sheet, as given.  Your physician recommends that you schedule a follow-up appointment in: [place].

## 2015-10-09 NOTE — Progress Notes (Signed)
Patient ID: Deaisa Hosaka, female   DOB: 07-Mar-1951, 65 y.o.   MRN: [id number]    Cardiology Office Note    Date:  10/13/2015   ID:  Tiffny Romanek, DOB [birth year], MRN [id number]  PCP:  Kandice Hams, MD  Cardiologist:   Sanda Klein, MD   Chief Complaint  Patient presents with  . New Evaluation    pt states she has had 3 blood clots--DVT  c/o SOB on exertion; swelling in legs/feet/ankles; kidney disease    History of Present Illness:  Tiane Grave is a 65 y.o. female with recurrent venous thromboembolic disease and recently diagnosed left ventricular systolic dysfunction. She is here for evaluation of the cardiomyopathy. Her echocardiogram on 08/31/2015 showed:  - Left ventricle: Diffuse hypokinesis possibly worse in inferior wall but poor endocardial definition consider f/u optison or MRI. The cavity size was moderately dilated. Wall thickness was normal. The estimated ejection fraction was 45%. Left ventricular diastolic function parameters were normal.  She was hospitalized in April with her third episode of venous thromboembolism. Previous episodes occurred in 2011 (pulmonary embolism diagnosed by V/Q scan) and 2016 (right femoral deep venous thrombosis). The last event occurred with a subtherapeutic anticoagulation rhythm (INR 1.5). She is planning to switch from warfarin to have a close, but has not done so yet. Very recent labs show that she has heterozygous state for factor V Leiden.  In 1994 she had locally recurrent right breast ductal carcinoma following a lumpectomy and chemotherapy, followed by complete mastectomy. She remembers receiving Adriamycin as part of her chemotherapy. She does not recall whether or not she had left ventricular systolic function monitoring during treatment.  She has obstructive sleep apnea and uses CPAP. Despite that her Epworth Sleepiness Scale score is 20 today. She has substantial daytime somnolence.  She has NYHA functional class II  exertional dyspnea. She also complains of swelling in both her feet. She denies exertional chest pain or any pleuritic discomfort. She has not experienced syncope or palpitations. Denies orthopnea or PND. She has not had any bleeding complications or systemic embolic events.  Her sister Phineas Semen was also my patient, but now lives in [place]. Phineas Semen has coronary artery disease with previous stent placement and also had long QT syndrome. She received a loop recorder but has not had documented ventricular tachyarrhythmia to my knowledge.   Past Medical History  Diagnosis Date  . OBSTRUCTIVE SLEEP APNEA     CPAP  . PULMONARY EMBOLISM 09/24/2008    anticoag thru 03/2010  . Proteinuria   . BREAST CANCER, HX OF 1995    Right Breast  . ANEMIA-NOS   . DIABETES MELLITUS, TYPE II   . HYPERLIPIDEMIA   . HYPERTENSION   . CHRONIC KIDNEY DISEASE STAGE III (MODERATE)     Past Surgical History  Procedure Laterality Date  . Mastectomy  1995    Right  . Insertion port a cath    . Port-a-cath removal      Current Medications: Outpatient Prescriptions Prior to Visit  Medication Sig Dispense Refill  . acetaminophen (TYLENOL) 500 MG tablet Take 500 mg by mouth every 6 (six) hours as needed for mild pain.    [name] allopurinol (ZYLOPRIM) 100 MG tablet Take 100 mg by mouth daily.    [name] apixaban (ELIQUIS) 5 MG TABS tablet Take 1 tablet (5 mg total) by mouth 2 (two) times daily. (Patient taking differently: Take 5 mg by mouth 2 (two) times daily. Has not started yet) 60 tablet 6  .  colchicine 0.6 MG tablet Take 0.6 mg by mouth daily.   1  . furosemide (LASIX) 20 MG tablet Take 1 tablet (20 mg total) by mouth daily. 30 tablet 0  . Insulin Glargine (LANTUS SOLOSTAR) 100 UNIT/ML Solostar Pen Inject 15 Units into the skin 2 (two) times daily. Per sliding scale.    . insulin lispro (HUMALOG KWIKPEN) 100 UNIT/ML KiwkPen Inject 10 Units into the skin 3 (three) times daily.    . metoprolol succinate (TOPROL-XL) 25 MG 24  hr tablet Take 1 tablet (25 mg total) by mouth daily. 30 tablet 0  . rosuvastatin (CRESTOR) 10 MG tablet Take 10 mg by mouth daily.    [name] warfarin (COUMADIN) 5 MG tablet Take 1 tablet (5 mg total) by mouth daily at 6 PM. Till next appointment on 09/04/2015 with coumadin clinic. (Patient taking differently: Take 5 mg by mouth daily at 6 PM. Tues, Wed, Thurs, Sat, Sun 5mg        Mon and Frid 5.5mg )     No facility-administered medications prior to visit.     Allergies:   Review of patient's allergies indicates no known allergies.   Social History   Social History  . Marital Status: Divorced    Spouse Name: N/A  . Number of Children: N/A  . Years of Education: 13   Social History Main Topics  . Smoking status: Never Smoker   . Smokeless tobacco: None     Comment: Lives alone-divorced  . Alcohol Use: No  . Drug Use: No  . Sexual Activity: Not Asked   Other Topics Concern  . None   Social History Narrative     Family History:  The patient's family history includes Diabetes in her sister.   ROS:   Please see the history of present illness.    ROS All other systems reviewed and are negative.   PHYSICAL EXAM:   VS:  BP 120/88 mmHg  Pulse 82  Ht 5\' 2"  (1.575 m)  Wt 100.699 kg (222 lb)  BMI 40.59 kg/m2   GEN: Well nourished, well developed, in no acute distress HEENT: normal Neck: no JVD, carotid bruits, or masses Cardiac: RRR; no murmurs, rubs, or gallops,no edema  Respiratory:  clear to auscultation bilaterally, normal work of breathing GI: soft, nontender, nondistended, + BS MS: no deformity or atrophy Skin: warm and dry, no rash Neuro:  Alert and Oriented x 3, Strength and sensation are intact Psych: euthymic mood, full affect  Wt Readings from Last 3 Encounters:  10/09/15 100.699 kg (222 lb)  10/05/15 101.152 kg (223 lb)  09/01/15 100.653 kg (221 lb 14.4 oz)      Studies/Labs Reviewed:   EKG:  EKG is ordered today.  The ekg ordered today demonstrates Sinus  rhythm, full to criteria for left ventricular hypertrophy, leftward axis, nonspecific T-wave changes, QTc 455 ms  Recent Labs: 08/30/2015: B Natriuretic Peptide 12.2 10/05/2015: ALT 19; BUN 27; Creatinine, Ser 2.14*; HGB 12.0; Platelets 222; Potassium, Ser 4.1; Sodium 137   Lipid Panel    Component Value Date/Time   CHOL 184 09/13/2012 1357   TRIG 142 09/13/2012 1357   HDL 45 09/13/2012 1357   CHOLHDL 4.1 09/13/2012 1357   VLDL 28 09/13/2012 1357   LDLCALC 111* 09/13/2012 1357   LDLDIRECT 141.3 10/14/2008 1004     ASSESSMENT:    1. Chronic combined systolic and diastolic congestive heart failure ([place])   2. Dyspnea   3. Recurrent deep venous thrombosis, bilateral ([place])   4.  Benign essential HTN   5. Obstructive sleep apnea   6. CKD (chronic kidney disease), stage IV ([place])   7. Obesity      PLAN:  In order of problems listed above:  1. CHF: The exact cause and severity of her cardiomyopathy is not clear. Her echocardiogram was of mediocre quality due to her body habitus. Her physical exam also makes volume assessment difficult There seemed to be regional wall motion abnormalities on her echo, raising the possibility of coronary disease. Alternatively, she could have Adriamycin-related cardiotoxicity. Since she has some degree of renal insufficiency as well as a recent pulmonary embolism, I'm reluctant to go directly to coronary angiography and will start with a nuclear perfusion study.We discussed the fact that the only way to get a definitive answer as to whether or not she has coronary disease might be coronary angiography. If she does have to undergo heart catheterization, then definitely also recommend right heart catheterization. This would allow [place] to assess the relative contribution of pulmonary artery hypertension (secondary to sleep apnea and chronic venous thromboembolic disease) versus left heart failure as causes of her dyspnea. 2. Dyspnea: As described above, hard to  separate the relative contribution of cor pulmonale (obstructive sleep apnea and recurrent PE) from diastolic and systolic left heart failure. Might need cardiac catheterization to clarify the issue. Her physical exam also makes. 3. Recurrent DVT/PE: Seems to have a hypercoagulable state due to heterozygous factor V Leiden. Should remain on lifelong anticoagulation. Will soon be switching to a direct oral anticoagulant. Her hematologist is addressing the issue of potential hypercoagulable states. Anti-cardiolipin antibodies, beta-2 glycoprotein, anti-thrombin, protein C are normal. Protein S antigen was 60%, but protein S functional was low at 27% (on warfarin). DR VVT was abnormal, but corrected with mix 4. HTN: Well controlled 5. OSA: Symptoms suggest that her sleep apnea may not be adequately treated. Consider CPAP titration study. 6. CKD: If we can, would like to avoid contrast the procedure, but this may not be possible 7. Obesity: This is probably playing a role in her tendency to have recurrent DVT as well as the presence of sleep apnea and heart failure.   Medication Adjustments/Labs and Tests Ordered: Current medicines are reviewed at length with the patient today.  Concerns regarding medicines are outlined above.  Medication changes, Labs and Tests ordered today are listed in the Patient Instructions below. Patient Instructions  Your physician has requested that you have a lexiscan myoview. For further information please visit [email or web address]. Please follow instruction sheet, as given.  Your physician recommends that you schedule a follow-up appointment in: [place].       Signed, Sanda Klein, MD  10/13/2015 3:59 PM    [facility] [address], [place], [place]  [address] Phone: [phone]; Fax: [phone]

## 2015-10-12 LAB — FACTOR 5 LEIDEN

## 2015-10-13 ENCOUNTER — Telehealth: Payer: Self-pay | Admitting: *Deleted

## 2015-10-13 NOTE — Telephone Encounter (Signed)
-----   Message from Volanda Napoleon, MD sent at 10/13/2015 12:56 PM EDT ----- Left a message on her answering machine that she does have a out about a with her clotting tests which is probably why she has had blood clots. She has a factor V Leiden mutation. She is heterozygous for this but this is enough to have caused blood clots.  I will be seeing her in about one month. She is to say on the [facility]. She can call if she has any questions.  pete

## 2015-10-15 ENCOUNTER — Ambulatory Visit (INDEPENDENT_AMBULATORY_CARE_PROVIDER_SITE_OTHER): Payer: Commercial Managed Care - HMO

## 2015-10-15 DIAGNOSIS — Z5181 Encounter for therapeutic drug level monitoring: Secondary | ICD-10-CM

## 2015-10-15 DIAGNOSIS — I82431 Acute embolism and thrombosis of right popliteal vein: Secondary | ICD-10-CM

## 2015-10-15 DIAGNOSIS — I2699 Other pulmonary embolism without acute cor pulmonale: Secondary | ICD-10-CM | POA: Diagnosis not present

## 2015-10-15 LAB — POCT INR: INR: 4

## 2015-10-15 NOTE — Patient Instructions (Signed)
A full discussion of the nature of anticoagulants has been carried out.  A benefit/risk analysis has been presented to the patient, so that they understand the justification for choosing anticoagulation with Eliquis at this time.  The need for compliance is stressed.  Pt is aware to take the medication twice daily.  Side effects of potential bleeding are discussed, including unusual colored urine or stools, coughing up blood or coffee ground emesis, nose bleeds or serious fall or head trauma.  Discussed signs and symptoms of stroke. The patient should avoid any OTC items containing aspirin or ibuprofen.  Avoid alcohol consumption.   Call if any signs of abnormal bleeding.  Discussed financial obligations and resolved any difficulty in obtaining medication.  Next lab test test in 1 month.

## 2015-10-20 ENCOUNTER — Telehealth (HOSPITAL_COMMUNITY): Payer: Self-pay

## 2015-10-20 DIAGNOSIS — G4733 Obstructive sleep apnea (adult) (pediatric): Secondary | ICD-10-CM | POA: Diagnosis not present

## 2015-10-20 NOTE — Telephone Encounter (Signed)
Encounter complete. 

## 2015-10-22 ENCOUNTER — Ambulatory Visit (HOSPITAL_COMMUNITY)
Admission: RE | Admit: 2015-10-22 | Discharge: 2015-10-22 | Disposition: A | Discharge status: Home / Self Care | Payer: Commercial Managed Care - HMO | Source: Ambulatory Visit | Attending: Cardiology | Admitting: Cardiology

## 2015-10-22 DIAGNOSIS — I5042 Chronic combined systolic (congestive) and diastolic (congestive) heart failure: Secondary | ICD-10-CM | POA: Insufficient documentation

## 2015-10-22 DIAGNOSIS — G4733 Obstructive sleep apnea (adult) (pediatric): Secondary | ICD-10-CM | POA: Diagnosis not present

## 2015-10-22 DIAGNOSIS — Z8249 Family history of ischemic heart disease and other diseases of the circulatory system: Secondary | ICD-10-CM | POA: Insufficient documentation

## 2015-10-22 DIAGNOSIS — Z6841 Body Mass Index (BMI) 40.0 and over, adult: Secondary | ICD-10-CM | POA: Insufficient documentation

## 2015-10-22 DIAGNOSIS — R5383 Other fatigue: Secondary | ICD-10-CM | POA: Diagnosis not present

## 2015-10-22 DIAGNOSIS — I130 Hypertensive heart and chronic kidney disease with heart failure and stage 1 through stage 4 chronic kidney disease, or unspecified chronic kidney disease: Secondary | ICD-10-CM | POA: Diagnosis not present

## 2015-10-22 DIAGNOSIS — R9439 Abnormal result of other cardiovascular function study: Secondary | ICD-10-CM | POA: Diagnosis not present

## 2015-10-22 DIAGNOSIS — E669 Obesity, unspecified: Secondary | ICD-10-CM | POA: Insufficient documentation

## 2015-10-22 DIAGNOSIS — R0600 Dyspnea, unspecified: Secondary | ICD-10-CM | POA: Diagnosis not present

## 2015-10-22 DIAGNOSIS — N183 Chronic kidney disease, stage 3 (moderate): Secondary | ICD-10-CM | POA: Insufficient documentation

## 2015-10-22 DIAGNOSIS — R0609 Other forms of dyspnea: Secondary | ICD-10-CM | POA: Diagnosis not present

## 2015-10-22 DIAGNOSIS — E1122 Type 2 diabetes mellitus with diabetic chronic kidney disease: Secondary | ICD-10-CM | POA: Diagnosis not present

## 2015-10-22 LAB — MYOCARDIAL PERFUSION IMAGING
CHL CUP NUCLEAR SRS: 3
CHL CUP RESTING HR STRESS: 86 {beats}/min
LV sys vol: 83 mL
LVDIAVOL: 134 mL (ref 46–106)
NUC STRESS TID: 0.99
Peak HR: 99 {beats}/min
SDS: 3
SSS: 6

## 2015-10-22 MED ORDER — REGADENOSON 0.4 MG/5ML IV SOLN
0.4000 mg | Freq: Once | INTRAVENOUS | Status: AC
  Administered 2015-10-22: 0.4 mg via INTRAVENOUS

## 2015-10-22 MED ORDER — TECHNETIUM TC 99M TETROFOSMIN IV KIT
31.9000 | PACK | Freq: Once | INTRAVENOUS | Status: AC | PRN
  Administered 2015-10-22: 31.9 via INTRAVENOUS
  Filled 2015-10-22: qty 32

## 2015-10-22 MED ORDER — TECHNETIUM TC 99M TETROFOSMIN IV KIT
10.8000 | PACK | Freq: Once | INTRAVENOUS | Status: AC | PRN
  Administered 2015-10-22: 11 via INTRAVENOUS
  Filled 2015-10-22: qty 11

## 2015-10-26 ENCOUNTER — Telehealth: Payer: Self-pay | Admitting: Cardiovascular Disease

## 2015-10-26 ENCOUNTER — Encounter: Payer: Self-pay | Admitting: Internal Medicine

## 2015-10-26 NOTE — Telephone Encounter (Signed)
Agree thank you 

## 2015-10-26 NOTE — Telephone Encounter (Signed)
Patient has poor renal function, but strong history of clotting. Would recommend she hold Eliquis for 48 hour prior to cath.

## 2015-10-26 NOTE — Telephone Encounter (Signed)
Patricia Morgan is scheduled to be admitted on June 18 and will have her cath on June 19 with Dr. Angelena Form.  I have talked to [place] and she will check with Dr.C about stopping her Eliquis.

## 2015-10-26 NOTE — Telephone Encounter (Signed)
SPOKE TO PATIENT INFORMATION GIVEN ,PATIENT VERBALIZED UNDERSTANDING  SHE STATES SHE WANTED KNOW WHAT TO TELL HER DAUGHTER. SHE VERBALIZED UNDERSTANDING.

## 2015-10-26 NOTE — Telephone Encounter (Signed)
Patient is scheduled for cath on 11-02-15.  She had a an abnormal nuc.  Patient wants to know if this means she has heart problems.  She is not clear why she is having the cath.  She is being admitted day prior for hydration.

## 2015-10-27 NOTE — Telephone Encounter (Signed)
Can you make sure patient is aware of this

## 2015-10-28 NOTE — Telephone Encounter (Signed)
LM about dosing of Eliquis prior to procedure to hold for 48 hrs - verified with Dr. Loletha Grayer. Left phone number for her to call with questions.

## 2015-10-29 ENCOUNTER — Telehealth: Payer: Self-pay | Admitting: Cardiovascular Disease

## 2015-10-29 NOTE — Telephone Encounter (Signed)
New Message  Pt requested to speak w/ RN about upcoming surgery mon 6/19 and about coming in on Sun 6/18. Please call back and discuss.

## 2015-10-30 DIAGNOSIS — N183 Chronic kidney disease, stage 3 (moderate): Secondary | ICD-10-CM | POA: Diagnosis not present

## 2015-10-30 NOTE — Telephone Encounter (Signed)
Returned call. Questions answered about preadmission and instructions to hold Eliquis 48 hrs prior to procedure. Answered pt concerns to her satisfaction.

## 2015-11-01 ENCOUNTER — Encounter (HOSPITAL_COMMUNITY): Payer: Self-pay | Admitting: *Deleted

## 2015-11-01 ENCOUNTER — Inpatient Hospital Stay (HOSPITAL_COMMUNITY)
Admission: AD | Admit: 2015-11-01 | Discharge: 2015-11-04 | DRG: 286 | Disposition: A | Discharge status: Home / Self Care | Payer: Commercial Managed Care - HMO | Source: Ambulatory Visit | Attending: Cardiovascular Disease | Admitting: Cardiovascular Disease

## 2015-11-01 ENCOUNTER — Other Ambulatory Visit: Payer: Self-pay

## 2015-11-01 DIAGNOSIS — I272 Other secondary pulmonary hypertension: Secondary | ICD-10-CM | POA: Diagnosis present

## 2015-11-01 DIAGNOSIS — I420 Dilated cardiomyopathy: Secondary | ICD-10-CM | POA: Diagnosis not present

## 2015-11-01 DIAGNOSIS — I129 Hypertensive chronic kidney disease with stage 1 through stage 4 chronic kidney disease, or unspecified chronic kidney disease: Secondary | ICD-10-CM | POA: Diagnosis present

## 2015-11-01 DIAGNOSIS — Z9221 Personal history of antineoplastic chemotherapy: Secondary | ICD-10-CM | POA: Diagnosis not present

## 2015-11-01 DIAGNOSIS — N183 Chronic kidney disease, stage 3 (moderate): Secondary | ICD-10-CM | POA: Diagnosis present

## 2015-11-01 DIAGNOSIS — Z853 Personal history of malignant neoplasm of breast: Secondary | ICD-10-CM

## 2015-11-01 DIAGNOSIS — Z79899 Other long term (current) drug therapy: Secondary | ICD-10-CM | POA: Diagnosis not present

## 2015-11-01 DIAGNOSIS — R9439 Abnormal result of other cardiovascular function study: Secondary | ICD-10-CM | POA: Diagnosis present

## 2015-11-01 DIAGNOSIS — E669 Obesity, unspecified: Secondary | ICD-10-CM | POA: Diagnosis present

## 2015-11-01 DIAGNOSIS — I200 Unstable angina: Secondary | ICD-10-CM | POA: Insufficient documentation

## 2015-11-01 DIAGNOSIS — E785 Hyperlipidemia, unspecified: Secondary | ICD-10-CM | POA: Diagnosis present

## 2015-11-01 DIAGNOSIS — G4733 Obstructive sleep apnea (adult) (pediatric): Secondary | ICD-10-CM | POA: Diagnosis not present

## 2015-11-01 DIAGNOSIS — I428 Other cardiomyopathies: Secondary | ICD-10-CM | POA: Diagnosis not present

## 2015-11-01 DIAGNOSIS — E1122 Type 2 diabetes mellitus with diabetic chronic kidney disease: Secondary | ICD-10-CM | POA: Diagnosis present

## 2015-11-01 DIAGNOSIS — I10 Essential (primary) hypertension: Secondary | ICD-10-CM | POA: Diagnosis not present

## 2015-11-01 DIAGNOSIS — I119 Hypertensive heart disease without heart failure: Secondary | ICD-10-CM | POA: Diagnosis present

## 2015-11-01 DIAGNOSIS — Z7901 Long term (current) use of anticoagulants: Secondary | ICD-10-CM

## 2015-11-01 DIAGNOSIS — I5023 Acute on chronic systolic (congestive) heart failure: Secondary | ICD-10-CM | POA: Diagnosis not present

## 2015-11-01 DIAGNOSIS — Z794 Long term (current) use of insulin: Secondary | ICD-10-CM

## 2015-11-01 DIAGNOSIS — I130 Hypertensive heart and chronic kidney disease with heart failure and stage 1 through stage 4 chronic kidney disease, or unspecified chronic kidney disease: Principal | ICD-10-CM | POA: Diagnosis present

## 2015-11-01 DIAGNOSIS — R931 Abnormal findings on diagnostic imaging of heart and coronary circulation: Secondary | ICD-10-CM

## 2015-11-01 DIAGNOSIS — I429 Cardiomyopathy, unspecified: Secondary | ICD-10-CM | POA: Diagnosis not present

## 2015-11-01 DIAGNOSIS — I5042 Chronic combined systolic (congestive) and diastolic (congestive) heart failure: Secondary | ICD-10-CM | POA: Diagnosis present

## 2015-11-01 DIAGNOSIS — N189 Chronic kidney disease, unspecified: Secondary | ICD-10-CM | POA: Diagnosis present

## 2015-11-01 DIAGNOSIS — I110 Hypertensive heart disease with heart failure: Secondary | ICD-10-CM

## 2015-11-01 DIAGNOSIS — E1121 Type 2 diabetes mellitus with diabetic nephropathy: Secondary | ICD-10-CM

## 2015-11-01 LAB — APTT
APTT: 31 s (ref 24–37)
APTT: 90 s — AB (ref 24–37)

## 2015-11-01 LAB — BASIC METABOLIC PANEL
ANION GAP: 7 (ref 5–15)
BUN: 27 mg/dL — ABNORMAL HIGH (ref 6–20)
CALCIUM: 9.4 mg/dL (ref 8.9–10.3)
CHLORIDE: 104 mmol/L (ref 101–111)
CO2: 26 mmol/L (ref 22–32)
CREATININE: 2.1 mg/dL — AB (ref 0.44–1.00)
GFR calc non Af Amer: 23 mL/min — ABNORMAL LOW (ref >60)
GFR, EST AFRICAN AMERICAN: 27 mL/min — AB (ref >60)
Glucose, Bld: 132 mg/dL — ABNORMAL HIGH (ref 65–99)
Potassium: 3.8 mmol/L (ref 3.5–5.1)
SODIUM: 137 mmol/L (ref 135–145)

## 2015-11-01 LAB — PROTIME-INR
INR: 1.09 (ref 0.00–1.49)
PROTHROMBIN TIME: 14.3 s (ref 11.6–15.2)

## 2015-11-01 LAB — CBC WITH DIFFERENTIAL/PLATELET
BASOS ABS: 0 10*3/uL (ref 0.0–0.1)
BASOS PCT: 0 %
EOS ABS: 0.1 10*3/uL (ref 0.0–0.7)
EOS PCT: 2 %
HEMATOCRIT: 34.5 % — AB (ref 36.0–46.0)
Hemoglobin: 11.3 g/dL — ABNORMAL LOW (ref 12.0–15.0)
Lymphocytes Relative: 46 %
Lymphs Abs: 2 10*3/uL (ref 0.7–4.0)
MCH: 29.5 pg (ref 26.0–34.0)
MCHC: 32.8 g/dL (ref 30.0–36.0)
MCV: 90.1 fL (ref 78.0–100.0)
MONO ABS: 0.3 10*3/uL (ref 0.1–1.0)
MONOS PCT: 6 %
NEUTROS ABS: 2.1 10*3/uL (ref 1.7–7.7)
Neutrophils Relative %: 46 %
PLATELETS: 197 10*3/uL (ref 150–400)
RBC: 3.83 MIL/uL — ABNORMAL LOW (ref 3.87–5.11)
RDW: 13.9 % (ref 11.5–15.5)
WBC: 4.5 10*3/uL (ref 4.0–10.5)

## 2015-11-01 LAB — GLUCOSE, CAPILLARY
Glucose-Capillary: 128 mg/dL — ABNORMAL HIGH (ref 65–99)
Glucose-Capillary: 153 mg/dL — ABNORMAL HIGH (ref 65–99)

## 2015-11-01 LAB — HEPARIN LEVEL (UNFRACTIONATED)
HEPARIN UNFRACTIONATED: 0.67 [IU]/mL (ref 0.30–0.70)
Heparin Unfractionated: 0.9 IU/mL — ABNORMAL HIGH (ref 0.30–0.70)

## 2015-11-01 MED ORDER — SODIUM CHLORIDE 0.9% FLUSH: 3.0000 mL | INTRAVENOUS | Status: DC | PRN

## 2015-11-01 MED ORDER — HEPARIN (PORCINE) IN NACL 100-0.45 UNIT/ML-% IJ SOLN
1000.0000 [IU]/h | INTRAMUSCULAR | Status: DC
  Administered 2015-11-01: 1000 [IU]/h via INTRAVENOUS
  Filled 2015-11-01: qty 250

## 2015-11-01 MED ORDER — ASPIRIN 81 MG PO CHEW
324.0000 mg | CHEWABLE_TABLET | ORAL | Status: AC
  Administered 2015-11-02: 324 mg via ORAL
  Filled 2015-11-01: qty 4

## 2015-11-01 MED ORDER — FUROSEMIDE 20 MG PO TABS: 20.0000 mg | ORAL_TABLET | Freq: Every day | ORAL | Status: DC

## 2015-11-01 MED ORDER — SODIUM CHLORIDE 0.9 % IV SOLN: 250.0000 mL | INTRAVENOUS | Status: DC | PRN

## 2015-11-01 MED ORDER — ZOLPIDEM TARTRATE 5 MG PO TABS
5.0000 mg | ORAL_TABLET | Freq: Every evening | ORAL | Status: DC | PRN
  Administered 2015-11-01 – 2015-11-03 (×2): 5 mg via ORAL
  Filled 2015-11-01 (×2): qty 1

## 2015-11-01 MED ORDER — ALPRAZOLAM 0.25 MG PO TABS
0.2500 mg | ORAL_TABLET | Freq: Two times a day (BID) | ORAL | Status: DC | PRN
  Administered 2015-11-02: 0.25 mg via ORAL
  Filled 2015-11-01: qty 1

## 2015-11-01 MED ORDER — ALLOPURINOL 100 MG PO TABS
100.0000 mg | ORAL_TABLET | Freq: Every day | ORAL | Status: DC
  Filled 2015-11-01: qty 1

## 2015-11-01 MED ORDER — INSULIN ASPART 100 UNIT/ML ~~LOC~~ SOLN
10.0000 [IU] | Freq: Three times a day (TID) | SUBCUTANEOUS | Status: DC
  Administered 2015-11-02 – 2015-11-04 (×5): 10 [IU] via SUBCUTANEOUS

## 2015-11-01 MED ORDER — HEPARIN BOLUS VIA INFUSION
4000.0000 [IU] | Freq: Once | INTRAVENOUS | Status: AC
  Administered 2015-11-01: 4000 [IU] via INTRAVENOUS
  Filled 2015-11-01: qty 4000

## 2015-11-01 MED ORDER — METOPROLOL SUCCINATE ER 25 MG PO TB24
25.0000 mg | ORAL_TABLET | Freq: Two times a day (BID) | ORAL | Status: DC
  Administered 2015-11-01 – 2015-11-02 (×3): 25 mg via ORAL
  Filled 2015-11-01 (×4): qty 1

## 2015-11-01 MED ORDER — COLCHICINE 0.6 MG PO TABS
0.6000 mg | ORAL_TABLET | Freq: Every day | ORAL | Status: DC
  Filled 2015-11-01: qty 1

## 2015-11-01 MED ORDER — SODIUM CHLORIDE 0.9% FLUSH: 3.0000 mL | Freq: Two times a day (BID) | INTRAVENOUS | Status: DC

## 2015-11-01 MED ORDER — NITROGLYCERIN 0.4 MG SL SUBL: 0.4000 mg | SUBLINGUAL_TABLET | SUBLINGUAL | Status: DC | PRN

## 2015-11-01 MED ORDER — INSULIN LISPRO 100 UNIT/ML (KWIKPEN): 10.0000 [IU] | PEN_INJECTOR | Freq: Three times a day (TID) | SUBCUTANEOUS | Status: DC

## 2015-11-01 MED ORDER — INSULIN GLARGINE 100 UNIT/ML ~~LOC~~ SOLN
15.0000 [IU] | Freq: Two times a day (BID) | SUBCUTANEOUS | Status: DC
  Administered 2015-11-01 – 2015-11-04 (×4): 15 [IU] via SUBCUTANEOUS
  Filled 2015-11-01 (×7): qty 0.15

## 2015-11-01 MED ORDER — ONDANSETRON HCL 4 MG/2ML IJ SOLN: 4.0000 mg | Freq: Four times a day (QID) | INTRAMUSCULAR | Status: DC | PRN

## 2015-11-01 MED ORDER — METOPROLOL SUCCINATE ER 25 MG PO TB24: 25.0000 mg | ORAL_TABLET | Freq: Every day | ORAL | Status: DC

## 2015-11-01 MED ORDER — ROSUVASTATIN CALCIUM 10 MG PO TABS: 10.0000 mg | ORAL_TABLET | Freq: Every day | ORAL | Status: DC

## 2015-11-01 MED ORDER — ROSUVASTATIN CALCIUM 10 MG PO TABS
10.0000 mg | ORAL_TABLET | Freq: Every day | ORAL | Status: DC
Start: 2015-11-01 — End: 2015-11-04
  Administered 2015-11-01 – 2015-11-03 (×3): 10 mg via ORAL
  Filled 2015-11-01 (×3): qty 1

## 2015-11-01 MED ORDER — ACETAMINOPHEN 325 MG PO TABS: 650.0000 mg | ORAL_TABLET | ORAL | Status: DC | PRN

## 2015-11-01 NOTE — Progress Notes (Addendum)
ANTICOAGULATION CONSULT NOTE - Follow Up Consult  Pharmacy Consult for heparin Indication: ACS and h/o VTE  Labs:  Recent Labs  11/01/15 1656 11/01/15 1759 11/01/15 2308  HGB 11.3*  --   --   HCT 34.5*  --   --   PLT 197  --   --   APTT 31  --  90*  LABPROT 14.3  --   --   INR 1.09  --   --   HEPARINUNFRC  --  0.90* 0.67  CREATININE 2.10*  --   --      Assessment/Plan:  65yo female therapeutic on heparin with initial dosing while Eliquis on hold. Will continue gtt at current rate and confirm stable with am labs.   Wynona Neat, PharmD, BCPS  11/01/2015,11:54 PM   ADDENDUM: Pt req's hydration for CIN prevention, bicarb is on critical supply, recs from [employer] of CCM are to use NS, spoke w/ Dr Wynonia Lawman who agrees that this is appropriate in this pt. VB 11/02/2015 12:48 AM

## 2015-11-01 NOTE — Progress Notes (Addendum)
ANTICOAGULATION CONSULT NOTE - Initial Consult  Pharmacy Consult for Heparin Indication: chest pain/ACS  No Known Allergies  Patient Measurements: Height: 5\' 2"  (157.5 cm) Weight: 221 lb 12.5 oz (100.6 kg) IBW/kg (Calculated) : 50.1 Heparin Dosing Weight: 74 kg  Vital Signs: Temp: 98.4 F (36.9 C) (06/18 1517) Temp Source: Oral (06/18 1517) BP: 147/76 mmHg (06/18 1517) Pulse Rate: 74 (06/18 1517)  Labs: No results for input(s): HGB, HCT, PLT, APTT, LABPROT, INR, HEPARINUNFRC, HEPRLOWMOCWT, CREATININE, CKTOTAL, CKMB, TROPONINI in the last 72 hours.  CrCl cannot be calculated (Patient has no serum creatinine result on file.).   Medical History: Past Medical History  Diagnosis Date  . OBSTRUCTIVE SLEEP APNEA     CPAP  . PULMONARY EMBOLISM 09/24/2008    anticoag thru 03/2010  . Proteinuria   . BREAST CANCER, HX OF 1995    Right Breast  . ANEMIA-NOS   . DIABETES MELLITUS, TYPE II   . HYPERLIPIDEMIA   . HYPERTENSION   . CHRONIC KIDNEY DISEASE STAGE III (MODERATE)     Medications:  Prescriptions prior to admission  Medication Sig Dispense Refill Last Dose  . acetaminophen (TYLENOL) 500 MG tablet Take 500 mg by mouth every 6 (six) hours as needed for mild pain.   Taking  . allopurinol (ZYLOPRIM) 100 MG tablet Take 100 mg by mouth daily.   Taking  . apixaban (ELIQUIS) 5 MG TABS tablet Take 1 tablet (5 mg total) by mouth 2 (two) times daily. 60 tablet 6 Taking  . colchicine 0.6 MG tablet Take 0.6 mg by mouth daily.   1 Taking  . furosemide (LASIX) 20 MG tablet Take 1 tablet (20 mg total) by mouth daily. 30 tablet 0 Taking  . Insulin Glargine (LANTUS SOLOSTAR) 100 UNIT/ML Solostar Pen Inject 15 Units into the skin 2 (two) times daily. Per sliding scale.   Taking  . insulin lispro (HUMALOG KWIKPEN) 100 UNIT/ML KiwkPen Inject 10 Units into the skin 3 (three) times daily.   Taking  . metoprolol succinate (TOPROL-XL) 25 MG 24 hr tablet Take 1 tablet (25 mg total) by mouth daily.  30 tablet 0 Taking  . rosuvastatin (CRESTOR) 10 MG tablet Take 10 mg by mouth daily.   Taking   Scheduled:  . allopurinol  100 mg Oral Daily  . colchicine  0.6 mg Oral Daily  . furosemide  20 mg Oral Daily  . Insulin Glargine  15 Units Subcutaneous BID  . insulin lispro  10 Units Subcutaneous TID  . metoprolol succinate  25 mg Oral Daily  . rosuvastatin  10 mg Oral Daily  . sodium chloride flush  3 mL Intravenous Q12H   Infusions:    Assessment: 65yo female presents with abnormal nuclear stress test. Pharmacy is consulted to dose heparin for ACS/chest pain.   Goal of Therapy:  Heparin level 0.3-0.7 units/ml aPTT 66-102 seconds Monitor platelets by anticoagulation protocol: Yes   Plan:  Give 4000 units bolus x 1 Start heparin infusion at 1000 units/hr Check anti-Xa level in 6 hours and daily while on heparin Continue to monitor H&H and platelets  Andrey Cota. Diona Foley, PharmD, [place] Clinical Pharmacist Pager [phone] 11/01/2015,4:40 PM

## 2015-11-01 NOTE — H&P (Signed)
Physician History and Physical     Patient ID: Patricia Morgan MRN: [id number] DOB/AGE: [birth year] 65 y.o. Admit date: 11/01/2015  Primary Care Physician: Kandice Hams, MD Primary Cardiologist: Croitor[place]  Active Problems:   Abnormal nuclear stress test   Chronic renal failure   HPI:   Patricia Morgan is a 65 y.o. [name] female with recurrent venous thromboembolic disease and recently diagnosed left ventricular systolic dysfunction. She is being admitted for heparin bridging and hydration before cath  Her echocardiogram on 08/31/2015 showed:  - Left ventricle: Diffuse hypokinesis possibly worse in inferior wall but poor endocardial definition consider f/u optison or MRI. The cavity size was moderately dilated. Wall thickness was normal. The estimated ejection fraction was 45%. Left ventricular diastolic function parameters were normal.  She was hospitalized in April with her third episode of venous thromboembolism. Previous episodes occurred in 2011 (pulmonary embolism diagnosed by V/Q scan) and 2016 (right femoral deep venous thrombosis). Her last dose of eliquis was on Thursday so she is more than 48 hrs out Very recent labs show that she has heterozygous state for factor V Leiden.  In 1994 she had locally recurrent right breast ductal carcinoma following a lumpectomy and chemotherapy, followed by complete mastectomy. She remembers receiving Adriamycin as part of her chemotherapy. She does not recall whether or not she had left ventricular systolic function monitoring during treatment.  She has obstructive sleep apnea and uses CPAP. Despite that her Epworth Sleepiness Scale score is 20 today. She has substantial daytime somnolence.  She has NYHA functional class II exertional dyspnea. She also complains of swelling in both her feet. She denies exertional chest pain or any pleuritic discomfort. She has not experienced syncope or palpitations. Denies orthopnea or PND. She has not had any  bleeding complications or systemic embolic events.  Her sister Phineas Semen is also a patient of Dr C She has CAD and  had documented ventricular tachyarrhythmia to my knowledge.  Myovue 10/22/15 reviewed and abnormal  The left ventricular ejection fraction is moderately decreased (30-44%).  Nuclear stress EF: 38%.  There is a small defect of moderate severity present in the basal inferior and mid inferior location. The defect is partially reversible and suspicious for a mild area of ischemia.  There is a small defect of moderate severity present in the basal inferolateral and mid inferolateral location. The defect is non-reversible.  This is an intermediate risk study.  Baseline EKG showed nonspecific T wave abnormality with no change with Lexiscan infusion.  Review of systems complete and found to be negative unless listed above   Past Medical History  Diagnosis Date  . OBSTRUCTIVE SLEEP APNEA     CPAP  . PULMONARY EMBOLISM 09/24/2008    anticoag thru 03/2010  . Proteinuria   . BREAST CANCER, HX OF 1995    Right Breast  . ANEMIA-NOS   . DIABETES MELLITUS, TYPE II   . HYPERLIPIDEMIA   . HYPERTENSION   . CHRONIC KIDNEY DISEASE STAGE III (MODERATE)     Family History  Problem Relation Age of Onset  . Diabetes Sister     Social History   Social History  . Marital Status: Divorced    Spouse Name: N/A  . Number of Children: N/A  . Years of Education: 12   Occupational History  . Not on file.   Social History Main Topics  . Smoking status: Never Smoker   . Smokeless tobacco: Not on file     Comment: Lives alone-divorced  . Alcohol  Use: No  . Drug Use: No  . Sexual Activity: Not on file   Other Topics Concern  . Not on file   Social History Narrative    Past Surgical History  Procedure Laterality Date  . Mastectomy  1995    Right  . Insertion port a cath    . Port-a-cath removal       Prescriptions prior to admission  Medication Sig Dispense Refill Last Dose    . acetaminophen (TYLENOL) 500 MG tablet Take 500 mg by mouth every 6 (six) hours as needed for mild pain.   Taking  . allopurinol (ZYLOPRIM) 100 MG tablet Take 100 mg by mouth daily.   Taking  . apixaban (ELIQUIS) 5 MG TABS tablet Take 1 tablet (5 mg total) by mouth 2 (two) times daily. 60 tablet 6 Taking  . colchicine 0.6 MG tablet Take 0.6 mg by mouth daily.   1 Taking  . furosemide (LASIX) 20 MG tablet Take 1 tablet (20 mg total) by mouth daily. 30 tablet 0 Taking  . Insulin Glargine (LANTUS SOLOSTAR) 100 UNIT/ML Solostar Pen Inject 15 Units into the skin 2 (two) times daily. Per sliding scale.   Taking  . insulin lispro (HUMALOG KWIKPEN) 100 UNIT/ML KiwkPen Inject 10 Units into the skin 3 (three) times daily.   Taking  . metoprolol succinate (TOPROL-XL) 25 MG 24 hr tablet Take 1 tablet (25 mg total) by mouth daily. 30 tablet 0 Taking  . rosuvastatin (CRESTOR) 10 MG tablet Take 10 mg by mouth daily.   Taking    Physical Exam: Blood pressure 147/76, pulse 74, temperature 98.4 F (36.9 C), temperature source Oral, resp. rate 18, height 5\' 2"  (1.575 m), weight 221 lb 12.5 oz (100.6 kg).    Affect appropriate Overweight black female  HEENT: normal Neck supple with no adenopathy JVP normal no bruits no thyromegaly Lungs clear with no wheezing and good diaphragmatic motion Heart:  S1/S2 no murmur, no rub, gallop or click PMI normal Abdomen: benighn, BS positve, no tenderness, no AAA no bruit.  No HSM or HJR Distal pulses intact with no bruits No edema Neuro non-focal Skin warm and dry No muscular weakness  No current facility-administered medications on file prior to encounter.   Current Outpatient Prescriptions on File Prior to Encounter  Medication Sig Dispense Refill  . acetaminophen (TYLENOL) 500 MG tablet Take 500 mg by mouth every 6 (six) hours as needed for mild pain.    [name] allopurinol (ZYLOPRIM) 100 MG tablet Take 100 mg by mouth daily.    [name] apixaban (ELIQUIS) 5 MG TABS  tablet Take 1 tablet (5 mg total) by mouth 2 (two) times daily. 60 tablet 6  . colchicine 0.6 MG tablet Take 0.6 mg by mouth daily.   1  . furosemide (LASIX) 20 MG tablet Take 1 tablet (20 mg total) by mouth daily. 30 tablet 0  . Insulin Glargine (LANTUS SOLOSTAR) 100 UNIT/ML Solostar Pen Inject 15 Units into the skin 2 (two) times daily. Per sliding scale.    . insulin lispro (HUMALOG KWIKPEN) 100 UNIT/ML KiwkPen Inject 10 Units into the skin 3 (three) times daily.    . metoprolol succinate (TOPROL-XL) 25 MG 24 hr tablet Take 1 tablet (25 mg total) by mouth daily. 30 tablet 0  . rosuvastatin (CRESTOR) 10 MG tablet Take 10 mg by mouth daily.    . [DISCONTINUED] fluticasone (FLONASE) 50 MCG/ACT nasal spray Place 2 sprays into the nose daily. 16 g 2  Labs:   Lab Results  Component Value Date   WBC 4.2 10/05/2015   HGB 12.0 10/05/2015   HCT 35.1 10/05/2015   MCV 92 10/05/2015   PLT 222 10/05/2015   No results for input(s): NA, K, CL, CO2, BUN, CREATININE, CALCIUM, PROT, BILITOT, ALKPHOS, ALT, AST, GLUCOSE in the last 168 hours.  Invalid input(s): LABALBU Lab Results  Component Value Date   CKTOTAL 328* 09/23/2009   CKTOTAL 296* 09/23/2009   CKTOTAL 165 09/24/2008   CKMB 1.1 09/23/2009   CKMB 1.0 09/23/2009   CKMB 1.2 09/24/2008   TROPONINI 0.03 08/30/2015   TROPONINI 0.03        NO INDICATION OF MYOCARDIAL INJURY. 09/23/2009   TROPONINI 0.02        NO INDICATION OF MYOCARDIAL INJURY. 09/23/2009     Lab Results  Component Value Date   CHOL 184 09/13/2012   CHOL 155 09/30/2011   CHOL 160 09/28/2010   Lab Results  Component Value Date   HDL 45 09/13/2012   HDL 46.70 09/30/2011   HDL 41.70 09/28/2010   Lab Results  Component Value Date   LDLCALC 111* 09/13/2012   LDLCALC 83 09/30/2011   LDLCALC 102* 09/28/2010   Lab Results  Component Value Date   TRIG 142 09/13/2012   TRIG 125.0 09/30/2011   TRIG 81.0 09/28/2010   Lab Results  Component Value Date    CHOLHDL 4.1 09/13/2012   CHOLHDL 3 09/30/2011   CHOLHDL 4 09/28/2010   Lab Results  Component Value Date   LDLDIRECT 141.3 10/14/2008       Radiology: No results found.  EKG: 10/09/15  SR voltage for LVH    ASSESSMENT AND PLAN: Cardiomyopathy:  Etiology not clear previous chemo RX with adriamycin  Abnormal myovue Admitted for right and left cath. Has been off eliquis For 48 hrs so will start heparin. Need to stop heparin 1hr before cath. She is on board with Dr Dinah Beers 2nd case.  She also has CRF  Lab Results  Component Value Date   CREATININE 2.14* 10/05/2015   BUN 27 10/05/2015   NA 137 10/05/2015   K 4.1 10/05/2015   CL 105 10/05/2015   CO2 24 10/05/2015   Will start hydration tonight including HCO3 Risks discussed with patient She is nervous but willing to proceed and understands possible risk of transient renal failure worsening Will prep both radials. She has had a right mastectomy and right heart would not be done from right brachial so would probably be Best to go left radial and left brachial.   CHF:  Appears euvolemic hold lasix for cath   Hypercoagulable:  Multiple DVT/PE Factor 5 Leiden.  On eliquis Start heparin tonight hold on call to cath lab  Signed: Collier Salina Nishan6/18/2017, 4:12 PM

## 2015-11-02 ENCOUNTER — Encounter (HOSPITAL_COMMUNITY)
Admission: AD | Disposition: A | Discharge status: Home / Self Care | Payer: Self-pay | Source: Ambulatory Visit | Attending: Cardiovascular Disease

## 2015-11-02 ENCOUNTER — Encounter (HOSPITAL_COMMUNITY): Payer: Self-pay | Admitting: Cardiovascular Disease

## 2015-11-02 DIAGNOSIS — I10 Essential (primary) hypertension: Secondary | ICD-10-CM

## 2015-11-02 DIAGNOSIS — I420 Dilated cardiomyopathy: Secondary | ICD-10-CM

## 2015-11-02 DIAGNOSIS — I749 Embolism and thrombosis of unspecified artery: Secondary | ICD-10-CM

## 2015-11-02 DIAGNOSIS — I428 Other cardiomyopathies: Secondary | ICD-10-CM | POA: Diagnosis present

## 2015-11-02 HISTORY — PX: CARDIAC CATHETERIZATION: SHX172

## 2015-11-02 LAB — BASIC METABOLIC PANEL
ANION GAP: 9 (ref 5–15)
BUN: 25 mg/dL — ABNORMAL HIGH (ref 6–20)
CHLORIDE: 109 mmol/L (ref 101–111)
CO2: 20 mmol/L — ABNORMAL LOW (ref 22–32)
Calcium: 8.9 mg/dL (ref 8.9–10.3)
Creatinine, Ser: 1.88 mg/dL — ABNORMAL HIGH (ref 0.44–1.00)
GFR calc non Af Amer: 27 mL/min — ABNORMAL LOW (ref >60)
GFR, EST AFRICAN AMERICAN: 31 mL/min — AB (ref >60)
Glucose, Bld: 178 mg/dL — ABNORMAL HIGH (ref 65–99)
POTASSIUM: 3.8 mmol/L (ref 3.5–5.1)
SODIUM: 138 mmol/L (ref 135–145)

## 2015-11-02 LAB — POCT I-STAT 3, ART BLOOD GAS (G3+)
ACID-BASE DEFICIT: 6 mmol/L — AB (ref 0.0–2.0)
Bicarbonate: 19.8 mEq/L — ABNORMAL LOW (ref 20.0–24.0)
O2 SAT: 91 %
PH ART: 7.324 — AB (ref 7.350–7.450)
TCO2: 21 mmol/L (ref 0–100)
pCO2 arterial: 38.1 mmHg (ref 35.0–45.0)
pO2, Arterial: 67 mmHg — ABNORMAL LOW (ref 80.0–100.0)

## 2015-11-02 LAB — POCT I-STAT 3, VENOUS BLOOD GAS (G3P V)
ACID-BASE DEFICIT: 5 mmol/L — AB (ref 0.0–2.0)
BICARBONATE: 21.2 meq/L (ref 20.0–24.0)
O2 SAT: 56 %
PO2 VEN: 32 mmHg (ref 31.0–45.0)
TCO2: 22 mmol/L (ref 0–100)
pCO2, Ven: 42.8 mmHg — ABNORMAL LOW (ref 45.0–50.0)
pH, Ven: 7.302 — ABNORMAL HIGH (ref 7.250–7.300)

## 2015-11-02 LAB — CBC
HEMATOCRIT: 33 % — AB (ref 36.0–46.0)
Hemoglobin: 10.5 g/dL — ABNORMAL LOW (ref 12.0–15.0)
MCH: 29.8 pg (ref 26.0–34.0)
MCHC: 31.8 g/dL (ref 30.0–36.0)
MCV: 93.8 fL (ref 78.0–100.0)
PLATELETS: 177 10*3/uL (ref 150–400)
RBC: 3.52 MIL/uL — ABNORMAL LOW (ref 3.87–5.11)
RDW: 14.3 % (ref 11.5–15.5)
WBC: 4.5 10*3/uL (ref 4.0–10.5)

## 2015-11-02 LAB — HEPARIN LEVEL (UNFRACTIONATED): HEPARIN UNFRACTIONATED: 0.51 [IU]/mL (ref 0.30–0.70)

## 2015-11-02 LAB — GLUCOSE, CAPILLARY
GLUCOSE-CAPILLARY: 77 mg/dL (ref 65–99)
Glucose-Capillary: 132 mg/dL — ABNORMAL HIGH (ref 65–99)
Glucose-Capillary: 156 mg/dL — ABNORMAL HIGH (ref 65–99)
Glucose-Capillary: 110 mg/dL — ABNORMAL HIGH (ref 65–99)

## 2015-11-02 LAB — APTT: aPTT: 66 seconds — ABNORMAL HIGH (ref 24–37)

## 2015-11-02 SURGERY — RIGHT/LEFT HEART CATH AND CORONARY ANGIOGRAPHY

## 2015-11-02 MED ORDER — FUROSEMIDE 10 MG/ML IJ SOLN
40.0000 mg | Freq: Two times a day (BID) | INTRAMUSCULAR | Status: DC
  Administered 2015-11-02: 40 mg via INTRAVENOUS
  Filled 2015-11-02: qty 4

## 2015-11-02 MED ORDER — FUROSEMIDE 10 MG/ML IJ SOLN
40.0000 mg | Freq: Two times a day (BID) | INTRAMUSCULAR | Status: DC
  Filled 2015-11-02: qty 4

## 2015-11-02 MED ORDER — SODIUM CHLORIDE 0.9 % IV SOLN
INTRAVENOUS | Status: DC
  Administered 2015-11-02: 01:00:00 via INTRAVENOUS

## 2015-11-02 MED ORDER — HEPARIN (PORCINE) IN NACL 100-0.45 UNIT/ML-% IJ SOLN: 1000.0000 [IU]/h | INTRAMUSCULAR | Status: DC

## 2015-11-02 MED ORDER — HEPARIN (PORCINE) IN NACL 2-0.9 UNIT/ML-% IJ SOLN
INTRAMUSCULAR | Status: AC
  Filled 2015-11-02: qty 1000

## 2015-11-02 MED ORDER — FENTANYL CITRATE (PF) 100 MCG/2ML IJ SOLN
INTRAMUSCULAR | Status: AC
  Filled 2015-11-02: qty 2

## 2015-11-02 MED ORDER — SODIUM CHLORIDE 0.9% FLUSH: 3.0000 mL | INTRAVENOUS | Status: DC | PRN

## 2015-11-02 MED ORDER — ALLOPURINOL 100 MG PO TABS
100.0000 mg | ORAL_TABLET | Freq: Two times a day (BID) | ORAL | Status: DC
  Administered 2015-11-02 – 2015-11-04 (×4): 100 mg via ORAL
  Filled 2015-11-02 (×4): qty 1

## 2015-11-02 MED ORDER — MIDAZOLAM HCL 2 MG/2ML IJ SOLN
INTRAMUSCULAR | Status: AC
  Filled 2015-11-02: qty 2

## 2015-11-02 MED ORDER — MIDAZOLAM HCL 2 MG/2ML IJ SOLN
INTRAMUSCULAR | Status: DC | PRN
  Administered 2015-11-02: 2 mg via INTRAVENOUS

## 2015-11-02 MED ORDER — COLCHICINE 0.6 MG PO TABS
0.6000 mg | ORAL_TABLET | Freq: Every day | ORAL | Status: DC
  Administered 2015-11-02 – 2015-11-04 (×3): 0.6 mg via ORAL
  Filled 2015-11-02 (×2): qty 1

## 2015-11-02 MED ORDER — FENTANYL CITRATE (PF) 100 MCG/2ML IJ SOLN
INTRAMUSCULAR | Status: DC | PRN
  Administered 2015-11-02: 50 ug via INTRAVENOUS

## 2015-11-02 MED ORDER — SODIUM CHLORIDE 0.9% FLUSH: 3.0000 mL | Freq: Two times a day (BID) | INTRAVENOUS | Status: DC

## 2015-11-02 MED ORDER — SODIUM CHLORIDE 0.9 % IV SOLN: 250.0000 mL | INTRAVENOUS | Status: DC | PRN

## 2015-11-02 MED ORDER — IOPAMIDOL (ISOVUE-370) INJECTION 76%
INTRAVENOUS | Status: AC
  Filled 2015-11-02: qty 100

## 2015-11-02 MED ORDER — HEPARIN (PORCINE) IN NACL 2-0.9 UNIT/ML-% IJ SOLN
INTRAMUSCULAR | Status: DC | PRN
  Administered 2015-11-02: 1000 mL

## 2015-11-02 MED ORDER — LIDOCAINE HCL (PF) 1 % IJ SOLN
INTRAMUSCULAR | Status: DC | PRN
  Administered 2015-11-02: 20 mL

## 2015-11-02 MED ORDER — APIXABAN 5 MG PO TABS
5.0000 mg | ORAL_TABLET | Freq: Two times a day (BID) | ORAL | Status: DC
  Administered 2015-11-02 – 2015-11-04 (×4): 5 mg via ORAL
  Filled 2015-11-02 (×4): qty 1

## 2015-11-02 MED ORDER — SODIUM CHLORIDE 0.9% FLUSH
3.0000 mL | Freq: Two times a day (BID) | INTRAVENOUS | Status: DC
  Administered 2015-11-02 – 2015-11-03 (×4): 3 mL via INTRAVENOUS

## 2015-11-02 MED ORDER — SODIUM CHLORIDE 0.9 % WEIGHT BASED INFUSION
1.0000 mL/kg/h | INTRAVENOUS | Status: DC
  Administered 2015-11-02: 1 mL/kg/h via INTRAVENOUS

## 2015-11-02 MED ORDER — SODIUM CHLORIDE 0.9 % IV SOLN: INTRAVENOUS | Status: AC

## 2015-11-02 MED ORDER — IOHEXOL 350 MG/ML SOLN
INTRAVENOUS | Status: DC | PRN
  Administered 2015-11-02: 50 mL via INTRAVENOUS

## 2015-11-02 MED ORDER — SODIUM CHLORIDE 0.9 % WEIGHT BASED INFUSION
3.0000 mL/kg/h | INTRAVENOUS | Status: DC
  Administered 2015-11-02 (×2): 3 mL/kg/h via INTRAVENOUS

## 2015-11-02 MED ORDER — LIDOCAINE HCL (PF) 1 % IJ SOLN
INTRAMUSCULAR | Status: AC
  Filled 2015-11-02: qty 30

## 2015-11-02 SURGICAL SUPPLY — 10 items
CATH INFINITI 5FR MULTPACK ANG (CATHETERS) ×2 IMPLANT
CATH SWAN GANZ 7F STRAIGHT (CATHETERS) ×2 IMPLANT
KIT HEART LEFT (KITS) ×3 IMPLANT
KIT HEART RIGHT NAMIC (KITS) ×3 IMPLANT
PACK CARDIAC CATHETERIZATION (CUSTOM PROCEDURE TRAY) ×3 IMPLANT
SHEATH PINNACLE 5F 10CM (SHEATH) ×2 IMPLANT
SHEATH PINNACLE 7F 10CM (SHEATH) ×2 IMPLANT
TRANSDUCER W/STOPCOCK (MISCELLANEOUS) ×4 IMPLANT
TUBING CIL FLEX 10 FLL-RA (TUBING) ×3 IMPLANT
WIRE EMERALD 3MM-J .035X150CM (WIRE) ×2 IMPLANT

## 2015-11-02 NOTE — Progress Notes (Signed)
ANTICOAGULATION CONSULT NOTE - Follow Up Consult  Pharmacy Consult for heparin Indication: ACS and h/o VTE  Labs:  Recent Labs  11/01/15 1656 11/01/15 1759 11/01/15 2308 11/02/15 0404  HGB 11.3*  --   --  10.5*  HCT 34.5*  --   --  33.0*  PLT 197  --   --  177  APTT 31  --  90* 66*  LABPROT 14.3  --   --   --   INR 1.09  --   --   --   HEPARINUNFRC  --  0.90* 0.67 0.51  CREATININE 2.10*  --   --  1.88*     Assessment/Plan:  65yo female therapeutic on heparin with initial dosing while Eliquis on hold.   Patient s/p cath this am with clear coronaries, ischemic workup completed. Plan to restart heparin tonight and continue until tomorrow morning. If groin site is ok will resume outpatient apixaban and discharge home.  HL at goal this am prior to cath will resume tonight at previous rate. Check levels in am.  Erin Hearing PharmD., BCPS Clinical Pharmacist Pager [phone] 11/02/2015 12:00 PM

## 2015-11-02 NOTE — Discharge Instructions (Signed)
Information on my medicine - ELIQUIS® (apixaban) ° °This medication education was reviewed with me or my healthcare representative as part of my discharge preparation.  The pharmacist that spoke with me during my hospital stay was:  Kadance Mccuistion Rhea, RPH ° °Why was Eliquis® prescribed for you? °Eliquis® was prescribed for you to reduce the risk of a blood clot forming that can cause a stroke if you have a medical condition called atrial fibrillation (a type of irregular heartbeat). ° °What do You need to know about Eliquis® ? °Take your Eliquis® TWICE DAILY - one tablet in the morning and one tablet in the evening with or without food. If you have difficulty swallowing the tablet whole please discuss with your pharmacist how to take the medication safely. ° °Take Eliquis® exactly as prescribed by your doctor and DO NOT stop taking Eliquis® without talking to the doctor who prescribed the medication.  Stopping may increase your risk of developing a stroke.  Refill your prescription before you run out. ° °After discharge, you should have regular check-up appointments with your healthcare provider that is prescribing your Eliquis®.  In the future your dose may need to be changed if your kidney function or weight changes by a significant amount or as you get older. ° °What do you do if you miss a dose? °If you miss a dose, take it as soon as you remember on the same day and resume taking twice daily.  Do not take more than one dose of ELIQUIS at the same time to make up a missed dose. ° °Important Safety Information °A possible side effect of Eliquis® is bleeding. You should call your healthcare provider right away if you experience any of the following: °  Bleeding from an injury or your nose that does not stop. °  Unusual colored urine (red or dark brown) or unusual colored stools (red or black). °  Unusual bruising for unknown reasons. °  A serious fall or if you hit your head (even if there is no  bleeding). ° °Some medicines may interact with Eliquis® and might increase your risk of bleeding or clotting while on Eliquis®. To help avoid this, consult your healthcare provider or pharmacist prior to using any new prescription or non-prescription medications, including herbals, vitamins, non-steroidal anti-inflammatory drugs (NSAIDs) and supplements. ° °This website has more information on Eliquis® (apixaban): http://www.eliquis.com/eliquis/home ° °

## 2015-11-02 NOTE — Progress Notes (Signed)
Utilization review completed.  

## 2015-11-02 NOTE — Progress Notes (Signed)
TELEMETRY: Reviewed telemetry pt in NSR: Filed Vitals:   11/01/15 2145 11/01/15 2253 11/02/15 0409 11/02/15 0620  BP:  154/83 145/77   Pulse: 82 90 89   Temp:   98.4 F (36.9 C)   TempSrc:   Oral   Resp: 18  19   Height:      Weight:    224 lb 6.4 oz (101.787 kg)  SpO2: 98%  96%    No intake or output data in the 24 hours ending 11/02/15 0814 Filed Weights   11/01/15 1517 11/02/15 0620  Weight: 221 lb 12.5 oz (100.6 kg) 224 lb 6.4 oz (101.787 kg)    Subjective Feels SOB but states it has been that way since her last PE. No swelling or chest pain. Anxious about cath today.  [name] allopurinol  100 mg Oral Daily  . colchicine  0.6 mg Oral Daily  . furosemide  20 mg Oral Daily  . insulin aspart  10 Units Subcutaneous TID WC  . insulin glargine  15 Units Subcutaneous BID  . metoprolol succinate  25 mg Oral BID  . rosuvastatin  10 mg Oral QHS  . sodium chloride flush  3 mL Intravenous Q12H  . sodium chloride flush  3 mL Intravenous Q12H   . sodium chloride 100 mL/hr at 11/02/15 0123  . sodium chloride 3 mL/kg/hr (11/02/15 0630)   Followed by  . sodium chloride 1 mL/kg/hr (11/02/15 0722)  . heparin 1,000 Units/hr (11/01/15 1726)    LABS: Basic Metabolic Panel:  Recent Labs  11/01/15 1656 11/02/15 0404  NA 137 138  K 3.8 3.8  CL 104 109  CO2 26 20*  GLUCOSE 132* 178*  BUN 27* 25*  CREATININE 2.10* 1.88*  CALCIUM 9.4 8.9   Liver Function Tests: No results for input(s): AST, ALT, ALKPHOS, BILITOT, PROT, ALBUMIN in the last 72 hours. No results for input(s): LIPASE, AMYLASE in the last 72 hours. CBC:  Recent Labs  11/01/15 1656 11/02/15 0404  WBC 4.5 4.5  NEUTROABS 2.1  --   HGB 11.3* 10.5*  HCT 34.5* 33.0*  MCV 90.1 93.8  PLT 197 177   Cardiac Enzymes: No results for input(s): CKTOTAL, CKMB, CKMBINDEX, TROPONINI in the last 72 hours. BNP: No results for input(s): PROBNP in the last 72 hours. D-Dimer: No results for input(s): DDIMER in the last 72  hours. Hemoglobin A1C: No results for input(s): HGBA1C in the last 72 hours. Fasting Lipid Panel: No results for input(s): CHOL, HDL, LDLCALC, TRIG, CHOLHDL, LDLDIRECT in the last 72 hours. Thyroid Function Tests: No results for input(s): TSH, T4TOTAL, T3FREE, THYROIDAB in the last 72 hours.  Invalid input(s): FREET3   Radiology/Studies:  No results found.  PHYSICAL EXAM General: Well developed, obese, in no acute distress. Appears mildly dyspneic. Head: Normocephalic, atraumatic, sclera non-icteric, oropharynx is clear Neck: Negative for carotid bruits. JVD not elevated. No adenopathy Lungs: Clear bilaterally to auscultation without wheezes, rales, or rhonchi. Breathing is unlabored. Heart: RRR S1 S2 without murmurs, rubs, or gallops.  Abdomen: Soft, non-tender, non-distended with normoactive bowel sounds. No hepatomegaly. No rebound/guarding. No obvious abdominal masses. Msk:  Strength and tone appears normal for age. Extremities: No clubbing, cyanosis or edema.  Distal pedal pulses are 2+ and equal bilaterally. Neuro: Alert and oriented X 3. Moves all extremities spontaneously. Psych:  Responds to questions appropriately with a normal affect.  ASSESSMENT AND PLAN: 1. Cardiomyopathy with EF 38% by nuclear study. 45% by Echo. Abnormal myoview with inferolateral partially reversible  defect. For cardiac cath today. Renal parameters better today with hydration. Check EDP- if elevated may need diuresis post cath. Limit contrast load. 2. History of recurrent thromboembolism. PE/DVT. On Chronic Eliquis. Currently on IV heparin. Plan to resume Eliquis post cath.  3. History of breast CA.  S/p lumpectomy and chemotherapy. 4. Obesity with OSA. On CPAP. 5. DM type 2 6. HTN 7. Hyperlipidemia. On crestor  8. CKD stage 3.   Present on Admission:  . Unstable angina ([place])  Signed, Peter Martinique, [place] 11/02/2015 8:14 AM

## 2015-11-02 NOTE — Progress Notes (Signed)
RT set up patient home unit CPAP. Patient is able to place her CPAP on herself.

## 2015-11-02 NOTE — Progress Notes (Signed)
Site area: Right groin  A 5 frech arterial and 7 french venous sheath was removed.  Site Prior to Removal:  Level 0  Pressure Applied For 20 MINUTES     Beginning at 1000am  Manual:   Yes.    Patient Status During Pull:  stable  Post Pull Groin Site:  Level 0  Post Pull Instructions Given:  Yes.    Post Pull Pulses Present:  Yes.    Dressing Applied:  Yes.    Comments:  VS remain stable during sheath pull by Seth Bake

## 2015-11-02 NOTE — Interval H&P Note (Signed)
History and Physical Interval Note:  11/02/2015 7:37 AM  [place]  has presented today for cardiac cath with the diagnosis of cardiomyopathy. The various methods of treatment have been discussed with the patient and family. After consideration of risks, benefits and other options for treatment, the patient has consented to  Procedure(s): Right/Left Heart Cath and Coronary Angiography (N/A) as a surgical intervention .  The patient's history has been reviewed, patient examined, no change in status, stable for surgery.  I have reviewed the patient's chart and labs.  Questions were answered to the patient's satisfaction.    Cath Lab Visit (complete for each Cath Lab visit)  Clinical Evaluation Leading to the Procedure:   ACS: No.  Non-ACS:    Anginal Classification: CCS II  Anti-ischemic medical therapy: Minimal Therapy (1 class of medications)  Non-Invasive Test Results: Intermediate-risk stress test findings: cardiac mortality 1-3%/year  Prior CABG: No previous CABG         Lauree Chandler

## 2015-11-02 NOTE — Progress Notes (Signed)
Cardiac cath results noted. No CAD. Elevated LV filling pressures and pulmonary HTN.  Will start IV lasix. Will not resume IV heparin but resume Eliquis this PM.  Gwenivere Hiraldo Martinique MD, [facility]

## 2015-11-03 DIAGNOSIS — I119 Hypertensive heart disease without heart failure: Secondary | ICD-10-CM | POA: Diagnosis present

## 2015-11-03 DIAGNOSIS — I5042 Chronic combined systolic (congestive) and diastolic (congestive) heart failure: Secondary | ICD-10-CM | POA: Diagnosis present

## 2015-11-03 DIAGNOSIS — I5023 Acute on chronic systolic (congestive) heart failure: Secondary | ICD-10-CM

## 2015-11-03 DIAGNOSIS — I110 Hypertensive heart disease with heart failure: Secondary | ICD-10-CM

## 2015-11-03 LAB — BASIC METABOLIC PANEL
Anion gap: 7 (ref 5–15)
BUN: 25 mg/dL — ABNORMAL HIGH (ref 6–20)
CHLORIDE: 108 mmol/L (ref 101–111)
CO2: 23 mmol/L (ref 22–32)
Calcium: 9.1 mg/dL (ref 8.9–10.3)
Creatinine, Ser: 1.97 mg/dL — ABNORMAL HIGH (ref 0.44–1.00)
GFR calc Af Amer: 29 mL/min — ABNORMAL LOW (ref >60)
GFR, EST NON AFRICAN AMERICAN: 25 mL/min — AB (ref >60)
GLUCOSE: 149 mg/dL — AB (ref 65–99)
POTASSIUM: 3.8 mmol/L (ref 3.5–5.1)
Sodium: 138 mmol/L (ref 135–145)

## 2015-11-03 LAB — CBC
HEMATOCRIT: 34.6 % — AB (ref 36.0–46.0)
HEMOGLOBIN: 11.1 g/dL — AB (ref 12.0–15.0)
MCH: 29.7 pg (ref 26.0–34.0)
MCHC: 32.1 g/dL (ref 30.0–36.0)
MCV: 92.5 fL (ref 78.0–100.0)
Platelets: 190 10*3/uL (ref 150–400)
RBC: 3.74 MIL/uL — ABNORMAL LOW (ref 3.87–5.11)
RDW: 14.3 % (ref 11.5–15.5)
WBC: 5.8 10*3/uL (ref 4.0–10.5)

## 2015-11-03 LAB — GLUCOSE, CAPILLARY
GLUCOSE-CAPILLARY: 166 mg/dL — AB (ref 65–99)
GLUCOSE-CAPILLARY: 130 mg/dL — AB (ref 65–99)
GLUCOSE-CAPILLARY: 147 mg/dL — AB (ref 65–99)
Glucose-Capillary: 134 mg/dL — ABNORMAL HIGH (ref 65–99)

## 2015-11-03 MED ORDER — CARVEDILOL 25 MG PO TABS
25.0000 mg | ORAL_TABLET | Freq: Two times a day (BID) | ORAL | Status: DC
  Administered 2015-11-03 – 2015-11-04 (×3): 25 mg via ORAL
  Filled 2015-11-03 (×3): qty 1

## 2015-11-03 MED ORDER — FUROSEMIDE 10 MG/ML IJ SOLN
40.0000 mg | Freq: Two times a day (BID) | INTRAMUSCULAR | Status: DC
  Administered 2015-11-03 – 2015-11-04 (×3): 40 mg via INTRAVENOUS
  Filled 2015-11-03 (×2): qty 4

## 2015-11-03 NOTE — Progress Notes (Signed)
TELEMETRY: Reviewed telemetry pt in NSR: Filed Vitals:   11/02/15 1102 11/02/15 2006 11/02/15 2238 11/03/15 0448  BP: 146/72 150/72  154/82  Pulse: 83 96 93 92  Temp:  98.1 F (36.7 C)  97.5 F (36.4 C)  TempSrc:  Oral  Oral  Resp:  20 18 20   Height:      Weight:    222 lb 6.4 oz (100.88 kg)  SpO2: 93% 97% 95% 94%    Intake/Output Summary (Last 24 hours) at 11/03/15 0817 Last data filed at 11/02/15 1733  Gross per 24 hour  Intake    240 ml  Output   1000 ml  Net   -760 ml   Filed Weights   11/01/15 1517 11/02/15 0620 11/03/15 0448  Weight: 221 lb 12.5 oz (100.6 kg) 224 lb 6.4 oz (101.787 kg) 222 lb 6.4 oz (100.88 kg)    Subjective Complains of SOB with any activity. States better than yesterday. Not SOB at rest.  . allopurinol  100 mg Oral BID  . apixaban  5 mg Oral BID  . carvedilol  25 mg Oral BID WC  . colchicine  0.6 mg Oral Daily  . furosemide  40 mg Intravenous Q12H  . insulin aspart  10 Units Subcutaneous TID WC  . insulin glargine  15 Units Subcutaneous BID  . rosuvastatin  10 mg Oral QHS  . sodium chloride flush  3 mL Intravenous Q12H  . sodium chloride flush  3 mL Intravenous Q12H      LABS: Basic Metabolic Panel:  Recent Labs  11/02/15 0404 11/03/15 0257  NA 138 138  K 3.8 3.8  CL 109 108  CO2 20* 23  GLUCOSE 178* 149*  BUN 25* 25*  CREATININE 1.88* 1.97*  CALCIUM 8.9 9.1   Liver Function Tests: No results for input(s): AST, ALT, ALKPHOS, BILITOT, PROT, ALBUMIN in the last 72 hours. No results for input(s): LIPASE, AMYLASE in the last 72 hours. CBC:  Recent Labs  11/01/15 1656 11/02/15 0404 11/03/15 0257  WBC 4.5 4.5 5.8  NEUTROABS 2.1  --   --   HGB 11.3* 10.5* 11.1*  HCT 34.5* 33.0* 34.6*  MCV 90.1 93.8 92.5  PLT 197 177 190   Cardiac Enzymes: No results for input(s): CKTOTAL, CKMB, CKMBINDEX, TROPONINI in the last 72 hours. BNP: No results for input(s): PROBNP in the last 72 hours. D-Dimer: No results for input(s):  DDIMER in the last 72 hours. Hemoglobin A1C: No results for input(s): HGBA1C in the last 72 hours. Fasting Lipid Panel: No results for input(s): CHOL, HDL, LDLCALC, TRIG, CHOLHDL, LDLDIRECT in the last 72 hours. Thyroid Function Tests: No results for input(s): TSH, T4TOTAL, T3FREE, THYROIDAB in the last 72 hours.  Invalid input(s): FREET3   Radiology/Studies:  No results found.  PHYSICAL EXAM General: Well developed, obese, in no acute distress. Appears mildly dyspneic. Head: Normocephalic, atraumatic, sclera non-icteric, oropharynx is clear Neck: Negative for carotid bruits. JVD not elevated. No adenopathy Lungs: Clear bilaterally to auscultation without wheezes, rales, or rhonchi. Breathing is unlabored. Heart: RRR S1 S2 without murmurs, rubs, or gallops.  Abdomen: Soft, non-tender, non-distended with normoactive bowel sounds. No hepatomegaly. No rebound/guarding. No obvious abdominal masses. Msk:  Strength and tone appears normal for age. Extremities: No clubbing, cyanosis or edema.  Distal pedal pulses are 2+ and equal bilaterally. Neuro: Alert and oriented X 3. Moves all extremities spontaneously. Psych:  Responds to questions appropriately with a normal affect.  ASSESSMENT AND PLAN: 1. Cardiomyopathy  Acute on chronic CHF.  EF 38% by nuclear study. 45% by Echo. Suspect predominantly due to hypertensive heart disease and diabetes. Abnormal myoview with inferolateral partially reversible defect. Cardiac cath showed normal coronaries. LV filling pressures elevated and pulmonary HTN. Renal parameters stable today. She is still quite symptomatic with CHF. Will continue lasix IV today. Monitor I/O and daily weights. Will switch metoprolol to coreg. Not a candidate for ACEi/ARB due to CKD.  2. History of recurrent thromboembolism. PE/DVT. On Chronic Eliquis.  3. History of breast CA.  S/p lumpectomy and chemotherapy 25 yrs ago  4. Obesity with OSA. On CPAP. 5. DM type 2 6. HTN 7.  Hyperlipidemia. On crestor  8. CKD stage 3. Follow renal parameters closely post cath and with diuresis.  Present on Admission:  . Unstable angina ([place])  Signed, Joniece Smotherman [place], [place] 11/03/2015 8:17 AM

## 2015-11-03 NOTE — Progress Notes (Signed)
RT NOTE:  Pt has CPAP setup @ bedside. She stated that she will put on when she is ready. RT will monitor.

## 2015-11-04 ENCOUNTER — Encounter: Payer: Self-pay | Admitting: *Deleted

## 2015-11-04 ENCOUNTER — Other Ambulatory Visit: Payer: Self-pay | Admitting: *Deleted

## 2015-11-04 ENCOUNTER — Other Ambulatory Visit: Payer: Self-pay | Admitting: Physician Assistant

## 2015-11-04 ENCOUNTER — Inpatient Hospital Stay: Payer: Commercial Managed Care - HMO | Admitting: Emergency Medicine

## 2015-11-04 ENCOUNTER — Ambulatory Visit: Payer: Commercial Managed Care - HMO | Admitting: Hematology & Oncology

## 2015-11-04 ENCOUNTER — Other Ambulatory Visit: Payer: Commercial Managed Care - HMO

## 2015-11-04 DIAGNOSIS — N179 Acute kidney failure, unspecified: Secondary | ICD-10-CM

## 2015-11-04 DIAGNOSIS — N183 Chronic kidney disease, stage 3 unspecified: Secondary | ICD-10-CM

## 2015-11-04 DIAGNOSIS — N184 Chronic kidney disease, stage 4 (severe): Secondary | ICD-10-CM

## 2015-11-04 LAB — BASIC METABOLIC PANEL
ANION GAP: 6 (ref 5–15)
BUN: 29 mg/dL — ABNORMAL HIGH (ref 6–20)
CALCIUM: 9 mg/dL (ref 8.9–10.3)
CO2: 25 mmol/L (ref 22–32)
CREATININE: 2.32 mg/dL — AB (ref 0.44–1.00)
Chloride: 106 mmol/L (ref 101–111)
GFR, EST AFRICAN AMERICAN: 24 mL/min — AB (ref >60)
GFR, EST NON AFRICAN AMERICAN: 21 mL/min — AB (ref >60)
Glucose, Bld: 199 mg/dL — ABNORMAL HIGH (ref 65–99)
Potassium: 3.6 mmol/L (ref 3.5–5.1)
SODIUM: 137 mmol/L (ref 135–145)

## 2015-11-04 LAB — GLUCOSE, CAPILLARY
GLUCOSE-CAPILLARY: 126 mg/dL — AB (ref 65–99)
GLUCOSE-CAPILLARY: 153 mg/dL — AB (ref 65–99)

## 2015-11-04 MED ORDER — FUROSEMIDE 40 MG PO TABS: 40.0000 mg | ORAL_TABLET | Freq: Two times a day (BID) | ORAL | Status: DC

## 2015-11-04 MED ORDER — CARVEDILOL 25 MG PO TABS: 25.0000 mg | ORAL_TABLET | Freq: Two times a day (BID) | ORAL | Status: DC

## 2015-11-04 NOTE — Consult Note (Signed)
   [facility] Inpatient Consult   11/04/2015  Kemiyah Sila [birth year] [id number]   Patient evaluated for long-term disease management services with [place] [facility] program. Spoke with inpatient RNCM who indicated patient could benefit from [place] Management program for CHF, DM. Went to bedside to discuss and offer [place] [facility] services. Patient is agreeable and written consent signed. Explained to patient that she will receive post hospital transition of care calls and will be evaluated for monthly home visits. Confirmed Primary Care MD as Dr. Delfina Redwood.  Confirmed best contact number as [phone]. Ms. Guillen endorses that she lives alone. She denies having issues with affording medications. She uses [employer] order for medications. Denies having issues with transportation. States she could use the additional follow up for CHF education.   Left [facility] Care Management packet and contact information at bedside. Will make inpatient RNCM aware that patient will be followed by [facility] post hospital discharge. Ms. Wyche reports she is discharging today.   Will request to be assigned to [place].   Marthenia Rolling, MSN-Ed, RN,BSN [facility] Liaison [phone]

## 2015-11-04 NOTE — Care Management Important Message (Signed)
Important Message  Patient Details  Name: Patricia Morgan MRN: [id number] Date of Birth: 05-03-1951   Medicare Important Message Given:  Yes    Loann Quill 11/04/2015, 1:44 PM

## 2015-11-04 NOTE — Discharge Summary (Signed)
Discharge Summary    Patient ID: Patricia Morgan,  MRN: [id number], DOB/AGE: July 27, 1950 65 y.o.  Admit date: 11/01/2015 Discharge date: 11/04/2015  Primary Care Provider: [place] Carol D Primary Cardiologist: Dr. Sallyanne Kuster  Discharge Diagnoses    Principal Problem:   Acute on chronic systolic CHF (congestive heart failure) ([place]) Active Problems:   Abnormal nuclear stress test   Chronic renal failure   Non-ischemic cardiomyopathy ([place])   Hypertensive heart disease    Acute on chronic kidney disease, stage III   DM   HLD   OSA on CPAP   Allergies No Known Allergies  Diagnostic Studies/Procedures    Right/Left Heart Cath and Coronary Angiography    Conclusion    1. No angiographic evidence of CAD 2. Elevated filling pressures  Recommendations: Will not need further ischemic evaluation. She has elevated filling pressures following pre-cath hydration. Would continue Lasix. May need to adjust dose based on renal function. I will hydrate today post cath. Restart heparin drip 8 hours post sheath pull. Will continue heparin tonight. If no groin access site bleeding in am, can restart Eliquis tomorrow. Would plan d/c home tomorrow if stable    History of Present Illness     Patricia Morgan is a 65 y.o. female with recurrent venous thromboembolic disease and recently diagnosed left ventricular systolic dysfunction. Seen by Dr. Sallyanne Kuster 5/26/17evaluation of the cardiomyopathy who felt that the exact cause and severity of her cardiomyopathy is not clear. She subsequent underwent nuclear stress test which was intermediate risk study with reversible ischemia to basal inferior and mid inferior location. She was "bridged" with enoxaparin and came in 11/01/15 for pre-hydration prior to cath.   Her echocardiogram on 08/31/2015 showed: Left ventricle: Diffuse hypokinesis possibly worse in inferior wall but poor endocardial definition consider f/u optison or MRI. The cavity size was  moderately dilated. Wall thickness was normal. The estimated ejection fraction was 45%. Left ventricular diastolic function parameters were normal.  She was hospitalized in April with her third episode of venous thromboembolism. Previous episodes occurred in 2011 (pulmonary embolism diagnosed by V/Q scan) and 2016 (right femoral deep venous thrombosis). The last event occurred with a subtherapeutic anticoagulation rhythm (INR 1.5). She is planning to switch from warfarin to have a close, but has not done so yet. Very recent labs show that she has heterozygous state for factor V Leiden.  In 1994 she had locally recurrent right breast ductal carcinoma following a lumpectomy and chemotherapy, followed by complete mastectomy. She remembers receiving Adriamycin as part of her chemotherapy. She does not recall whether or not she had left ventricular systolic function monitoring during treatment.  She has obstructive sleep apnea and uses CPAP. Despite that her Epworth Sleepiness Scale score is 20 today. She has substantial daytime somnolence.  She has NYHA functional class II exertional dyspnea. She also complains of swelling in both her feet. She denies exertional chest pain or any pleuritic discomfort. She has not experienced syncope or palpitations. Denies orthopnea or PND. She has not had any bleeding complications or systemic embolic events.    Hospital Course     Consultants: None  She was admitted 5/18  for heparin bridging and hydration before cath. Held Eliquis.  Renal parameters improved with hydration. Cath showed normal coronaries. Elevated LV filling pressures and pulmonary HTN. Started on IV lasix. Resumed Eliquis post cath. Suspected her cardiomyopathy predominantly due to hypertensive heart disease and diabetes. Her kidney function worsen to 2.32 from 1.88 with IV diuresis but still  in range of baseline (2.1-2.3). Creatinine had improved prior to cath with hydration. Symptomatically much  improved with lasix IV. Total net diuresis of 1.9L with total 1 lb weight loss (221-->220lb). Switch to PO lasix at day of discharge. Switched metoprolol to coreg. Not a candidate for ACEi/ARB due to CKD.she is enrolled in Apixaban Validation Study.   The patient has been seen by Dr. Martinique today and deemed ready for discharge home. All follow-up appointments have been scheduled. Discharge medications are listed below.   She takes allopurinol and colchicine for gout chronically for the past one year. She will touch base with PCP if she can be off any one. BMET next week. Heart failure education given.   Discharge Vitals Blood pressure 125/72, pulse 82, temperature 98.4 F (36.9 C), temperature source Oral, resp. rate 20, height 5\' 2"  (1.575 m), weight 220 lb 12.8 oz (100.154 kg), SpO2 97 %.  Filed Weights   11/02/15 0620 11/03/15 0448 11/04/15 0517  Weight: 224 lb 6.4 oz (101.787 kg) 222 lb 6.4 oz (100.88 kg) 220 lb 12.8 oz (100.154 kg)    Labs & Radiologic Studies     CBC  Recent Labs  11/01/15 1656 11/02/15 0404 11/03/15 0257  WBC 4.5 4.5 5.8  NEUTROABS 2.1  --   --   HGB 11.3* 10.5* 11.1*  HCT 34.5* 33.0* 34.6*  MCV 90.1 93.8 92.5  PLT 197 177 [id number]   Basic Metabolic Panel  Recent Labs  11/03/15 0257 11/04/15 0250  NA 138 137  K 3.8 3.6  CL 108 106  CO2 23 25  GLUCOSE 149* 199*  BUN 25* 29*  CREATININE 1.97* 2.32*  CALCIUM 9.1 9.0   Liver Function Tests No results for input(s): AST, ALT, ALKPHOS, BILITOT, PROT, ALBUMIN in the last 72 hours. No results for input(s): LIPASE, AMYLASE in the last 72 hours. Cardiac Enzymes No results for input(s): CKTOTAL, CKMB, CKMBINDEX, TROPONINI in the last 72 hours. BNP Invalid input(s): POCBNP D-Dimer No results for input(s): DDIMER in the last 72 hours. Hemoglobin A1C No results for input(s): HGBA1C in the last 72 hours. Fasting Lipid Panel No results for input(s): CHOL, HDL, LDLCALC, TRIG, CHOLHDL, LDLDIRECT in the last  72 hours. Thyroid Function Tests No results for input(s): TSH, T4TOTAL, T3FREE, THYROIDAB in the last 72 hours.  Invalid input(s): FREET3  No results found.  Disposition   Pt is being discharged home today in good condition.  Follow-up Plans & Appointments    Follow-up Information    Follow up with POLITE,RONALD D, MD In 1 week.   Specialty:  Internal Medicine   Why:  for hospital f/u with discussion of gout medications   Contact information:   [address] [place] [address] [phone]       Follow up with [facility]. Go on 11/09/2015.   Specialty:  Cardiology   Why:  for kidney function check between 8am to 5pm. Non fasting   Contact information:   [address], [place] [phone]      Follow up with Sanda Klein, MD. Go on 11/20/2015.   Specialty:  Cardiology   Why:  @2 :15 as scheduled   Contact information:   [address] [place] [address] [phone]      Discharge Instructions    AMB Referral to [place] Management    Complete by:  As directed   Please assign to [facility] for transition  of care and CHF disease and symptom management. To discharge home today 11/04/15. Written consent obtained. Please call with questions. Thanks. Marthenia Rolling, [place], [facility] [phone]  Reason for consult:  Please assign to [facility] RNCM  Diagnoses of:   Diabetes Heart Failure    Expected date of contact:  1-3 days (reserved for hospital discharges)     Diet - low sodium heart healthy    Complete by:  As directed      Discharge instructions    Complete by:  As directed   No driving for 24 hours . No lifting over 5 lbs for 5 days.. No sexual activity for 1 week. You may return to work on 11/09/15. Keep procedure site clean & dry. If you notice increased pain, swelling, bleeding or pus, call/return!  You may shower, but no soaking  baths/hot tubs/pools for 5 days.   *Weigh yourself on the same scale at same time of day and keep a log. *Report weight gain of > 2 lbs in 1 day or 5 lbs over the course of a week and/or symptoms of excess fluid (shortness of breath, difficulty lying flat, swelling, poor appetite, abdominal fullness/bloating, etc) to your doctor immediately. *Avoid foods that are high in sodium (processed, pre-packaged/canned goods, fast foods, etc). *Please attend all scheduled and reccommended follow up appointments     Increase activity slowly    Complete by:  As directed            Discharge Medications   Current Discharge Medication List    START taking these medications   Details  carvedilol (COREG) 25 MG tablet Take 1 tablet (25 mg total) by mouth 2 (two) times daily with a meal. Qty: 60 tablet, Refills: 6      CONTINUE these medications which have CHANGED   Details  furosemide (LASIX) 40 MG tablet Take 1 tablet (40 mg total) by mouth 2 (two) times daily. Qty: 60 tablet, Refills: 3   Associated Diagnoses: Essential hypertension      CONTINUE these medications which have NOT CHANGED   Details  acetaminophen (TYLENOL) 500 MG tablet Take 500 mg by mouth every 6 (six) hours as needed for mild pain.    allopurinol (ZYLOPRIM) 100 MG tablet Take 100 mg by mouth 2 (two) times daily.     apixaban (ELIQUIS) 5 MG TABS tablet Take 1 tablet (5 mg total) by mouth 2 (two) times daily. Qty: 60 tablet, Refills: 6   Associated Diagnoses: Other acute pulmonary embolism without acute cor pulmonale (HCC)    colchicine 0.6 MG tablet Take 0.6 mg by mouth daily.  Refills: 1    Insulin Glargine (LANTUS SOLOSTAR) 100 UNIT/ML Solostar Pen Inject 15 Units into the skin 2 (two) times daily. Per sliding scale.    insulin lispro (HUMALOG KWIKPEN) 100 UNIT/ML KiwkPen Inject 10 Units into the skin 3 (three) times daily.    rosuvastatin (CRESTOR) 10 MG tablet Take 10 mg by mouth daily.      STOP taking these  medications     metoprolol succinate (TOPROL-XL) 25 MG 24 hr tablet            Outstanding Labs/Studies   BMET  Duration of Discharge Encounter   Greater than 30 minutes including physician time.  Signed, Taletha Twiford PA-C 11/04/2015, 12:26 PM

## 2015-11-04 NOTE — Progress Notes (Signed)
TELEMETRY: Reviewed telemetry pt in NSR: Filed Vitals:   11/03/15 1506 11/03/15 1938 11/04/15 0517 11/04/15 0826  BP: 132/70 122/70 135/68 125/72  Pulse: 84 81 79 82  Temp:  98.5 F (36.9 C) 98.4 F (36.9 C)   TempSrc:  Oral Oral   Resp: 20 20 20    Height:      Weight:   220 lb 12.8 oz (100.154 kg)   SpO2: 97% 96% 97%     Intake/Output Summary (Last 24 hours) at 11/04/15 1027 Last data filed at 11/04/15 0900  Gross per 24 hour  Intake    720 ml  Output   1800 ml  Net  -1080 ml   Filed Weights   11/02/15 0620 11/03/15 0448 11/04/15 0517  Weight: 224 lb 6.4 oz (101.787 kg) 222 lb 6.4 oz (100.88 kg) 220 lb 12.8 oz (100.154 kg)    Subjective States SOB is much better today. Not SOB with ambulation. Good urine output with lasix.   [name] allopurinol  100 mg Oral BID  . apixaban  5 mg Oral BID  . carvedilol  25 mg Oral BID WC  . colchicine  0.6 mg Oral Daily  . furosemide  40 mg Intravenous BID  . insulin aspart  10 Units Subcutaneous TID WC  . insulin glargine  15 Units Subcutaneous BID  . rosuvastatin  10 mg Oral QHS  . sodium chloride flush  3 mL Intravenous Q12H  . sodium chloride flush  3 mL Intravenous Q12H      LABS: Basic Metabolic Panel:  Recent Labs  11/03/15 0257 11/04/15 0250  NA 138 137  K 3.8 3.6  CL 108 106  CO2 23 25  GLUCOSE 149* 199*  BUN 25* 29*  CREATININE 1.97* 2.32*  CALCIUM 9.1 9.0   Liver Function Tests: No results for input(s): AST, ALT, ALKPHOS, BILITOT, PROT, ALBUMIN in the last 72 hours. No results for input(s): LIPASE, AMYLASE in the last 72 hours. CBC:  Recent Labs  11/01/15 1656 11/02/15 0404 11/03/15 0257  WBC 4.5 4.5 5.8  NEUTROABS 2.1  --   --   HGB 11.3* 10.5* 11.1*  HCT 34.5* 33.0* 34.6*  MCV 90.1 93.8 92.5  PLT 197 177 190   Cardiac Enzymes: No results for input(s): CKTOTAL, CKMB, CKMBINDEX, TROPONINI in the last 72 hours. BNP: No results for input(s): PROBNP in the last 72 hours. D-Dimer: No results for  input(s): DDIMER in the last 72 hours. Hemoglobin A1C: No results for input(s): HGBA1C in the last 72 hours. Fasting Lipid Panel: No results for input(s): CHOL, HDL, LDLCALC, TRIG, CHOLHDL, LDLDIRECT in the last 72 hours. Thyroid Function Tests: No results for input(s): TSH, T4TOTAL, T3FREE, THYROIDAB in the last 72 hours.  Invalid input(s): FREET3   Radiology/Studies:  No results found.  PHYSICAL EXAM General: Well developed, obese, in no acute distress. Appears mildly dyspneic. Head: Normocephalic, atraumatic, sclera non-icteric, oropharynx is clear Neck: Negative for carotid bruits. JVD not elevated. No adenopathy Lungs: Clear bilaterally to auscultation without wheezes, rales, or rhonchi. Breathing is unlabored. Heart: RRR S1 S2 without murmurs, rubs, or gallops.  Abdomen: Soft, non-tender, non-distended with normoactive bowel sounds. No hepatomegaly. No rebound/guarding. No obvious abdominal masses. Msk:  Strength and tone appears normal for age. Extremities: No clubbing, cyanosis or edema.  Distal pedal pulses are 2+ and equal bilaterally. Neuro: Alert and oriented X 3. Moves all extremities spontaneously. Psych:  Responds to questions appropriately with a normal affect.  ASSESSMENT AND PLAN: 1.  Cardiomyopathy Acute on chronic CHF.  EF 38% by nuclear study. 45% by Echo. Suspect predominantly due to hypertensive heart disease and diabetes. Abnormal myoview with inferolateral partially reversible defect. Cardiac cath showed normal coronaries. LV filling pressures elevated and pulmonary HTN. Renal parameters show some increase in creatinine today but still in range of baseline. Creatinine had improved prior to cath with hydration. Symptomatically much improved with lasix IV. Will switch to po today.  Monitor  daily weights. Will switch metoprolol to coreg. Not a candidate for ACEi/ARB due to CKD.  I think she is stable for DC today but will need close follow up of BMET as outpatient.    2. History of recurrent thromboembolism. PE/DVT. On Chronic Eliquis.  3. History of breast CA.  S/p lumpectomy and chemotherapy 25 yrs ago  4. Obesity with OSA. On CPAP. 5. DM type 2 6. HTN 7. Hyperlipidemia. On crestor  8. CKD stage 3. Followrenal parameters closely post cath and with diuresis.   Present on Admission:  . Abnormal nuclear stress test . Chronic renal failure . Non-ischemic cardiomyopathy ([place]) . Acute on chronic systolic CHF (congestive heart failure) ([place]) . Hypertensive heart disease  Signed, Daziah Hesler [place]ique, [place] 11/04/2015 10:27 AM

## 2015-11-04 NOTE — Research (Signed)
Apixaban Validation Study (ClinicalTrials.gov Identifier: [id number]) RESEARCH SUBJECT. Purpose: Obtain fresh samples that will be used to assess the performances of STA-Apixaban Calibrator and STA-Apixaban Control in combination with the STA-Liquid Anti-Xa to determine the quantity of apixaban in plasma samples by measurement of its direct anti-Xa activity. Apixaban Validation Study is sponsored by [employer].The fresh samples will collected by completing a one time blood draw on approved patients.   Inclusion Criteria: Must meet AT LEAST one of the following:  weight </= 60kg or >/= 75 years or Hct <39% for female or < 36% for female or renal impairment or co-medication with ASA/NSAIDs, or co-medication with anti-platelet agents.  Apixaban Validation Study Informed Consent   Subject Name: Patricia Morgan  This patient, [name], has been consented to the above clinical trial according to FDA regulations, GCP guidelines and [employer]'s SOPs. The informed consent form and study design have been explained to this patient by this study coordinator at 09:00 AM on 11/04/2015. The patient demonstrated comprehension of this clinical trial and study requirements/expectations. No study procedures have been initiated before consenting of this patient. The patient was given sufficient time for reading the consent form. All risks, benefits and options have been thoroughly discussed and all questions were answered per the patient's satisfaction. This patient was not coerced in any way to participate in this clinical trial. This patient has voluntarily signed consent version 1.0 at 11:00 AM on 11/04/2015. A copy of the signed consent form was given to the patient and a copy was placed in the subject's medical record. Subject was thanked for their participation in research and contribution to science.  Dennison Mascot, RN Clinical Research Nurse  [place], [facility] Office: [phone]

## 2015-11-04 NOTE — Progress Notes (Signed)
Patient discharged home with friend. IV was dc'd and was intact. Medications were educated on and she stated that she understood. Discharge instructions were educated on and a hand out was given. She stated that she understood.

## 2015-11-05 ENCOUNTER — Other Ambulatory Visit: Payer: Commercial Managed Care - HMO

## 2015-11-05 ENCOUNTER — Ambulatory Visit: Payer: Commercial Managed Care - HMO | Admitting: Hematology & Oncology

## 2015-11-05 ENCOUNTER — Other Ambulatory Visit: Payer: Self-pay | Admitting: *Deleted

## 2015-11-05 ENCOUNTER — Encounter: Payer: Self-pay | Admitting: *Deleted

## 2015-11-05 NOTE — Patient Outreach (Addendum)
[facility]) Care Management  11/05/2015  Patricia Morgan 1950-12-04 [id number]   RN briefly spoke with pt today and introduced the RN program and purpose for today's call. Pt verified an interest and indicated she needs more education on heart failure as this is a new diagnosis. Pt indicated she could not talk at this time and requested to call RN back for details.   Few minutes later pt returned the call indicating she was interested in the [facility] program and verified three identifiers. RN completed both the transition of care template and due to pt's interest in enrolling completed the initial assessment. Pt reports she is not aware of HF and did not receive to much information upon her discharge. Pt has not been weighing and was not informed of daily weights or ho to monitor this medical condition. RN began with educating pt on HF and why its importance to completed daily weights and how fluid retention affects her HF. RN detail the HF zones and the importance of remaining in the GREEN zone which was verified today with no swelling or issues to intervene in at this time. Pt verbalized an understanding and eager to manage this new diagnosis with a lifestyle of changes. Pt indicated she will weight daily and start documenting all her numbers. Pt verbalized an understanding of the GREEN zone with what to do if she gains 3 lbs over night or 5 lbs within one week. Pt states her current weight upon discharge yesterdays was 222 lbs with no swelling or difficulty breathing. Discussed when pt would contact her provider for interventions and when pt should seek emergent  Medical attention with acute symptoms.  RN informed pt if there are other medical conditions is needs to inquire about please present and these medical conditions can also be incorporated into ongoing educational visits for managing her care better. Offered a home visit as pt very receptive and agreed. Based upon pt's schedule RN  able to schedule a home visit next week for [facility] community case management services.  Patient was recently discharged from hospital and all medications have been reviewed.  [name], RN Care Management Coordinator [place] Office [phone]

## 2015-11-09 ENCOUNTER — Other Ambulatory Visit (INDEPENDENT_AMBULATORY_CARE_PROVIDER_SITE_OTHER): Payer: Commercial Managed Care - HMO

## 2015-11-09 DIAGNOSIS — N179 Acute kidney failure, unspecified: Secondary | ICD-10-CM | POA: Diagnosis not present

## 2015-11-09 DIAGNOSIS — N183 Chronic kidney disease, stage 3 (moderate): Secondary | ICD-10-CM

## 2015-11-09 LAB — BASIC METABOLIC PANEL
BUN: 53 mg/dL — AB (ref 7–25)
CALCIUM: 9.2 mg/dL (ref 8.6–10.4)
CO2: 26 mmol/L (ref 20–31)
CREATININE: 2.76 mg/dL — AB (ref 0.50–0.99)
Chloride: 104 mmol/L (ref 98–110)
GLUCOSE: 169 mg/dL — AB (ref 65–99)
POTASSIUM: 4.3 mmol/L (ref 3.5–5.3)
Sodium: 139 mmol/L (ref 135–146)

## 2015-11-10 ENCOUNTER — Telehealth: Payer: Self-pay | Admitting: *Deleted

## 2015-11-10 ENCOUNTER — Other Ambulatory Visit: Payer: Self-pay | Admitting: *Deleted

## 2015-11-10 DIAGNOSIS — R799 Abnormal finding of blood chemistry, unspecified: Secondary | ICD-10-CM

## 2015-11-10 DIAGNOSIS — R7989 Other specified abnormal findings of blood chemistry: Secondary | ICD-10-CM

## 2015-11-10 NOTE — Telephone Encounter (Signed)
-----   Message from [place], [place] sent at 11/10/2015  3:17 PM EDT ----- Please advise patient to hold lasix x 3 days. Recheck BMET Friday. I am off on Friday so forward result to on call PA. Will resume lasix once lab result available. Might need to change dosage. Attach this result to note.   Thanks vin   ----- Message -----    From: Loren Racer, LPN    Sent: [id number]  11:42 AM      To: Leanor Kail, PA  When you get a second, can you look at this pt's labs in your basket? Crea and BUN jumped.  Pt does have CKD but wanted to make you aware of change.  Thanks [employer]

## 2015-11-10 NOTE — Telephone Encounter (Signed)
Spoke with pt and advised her of recommendations per [employer], PA-C. Pt verbalized understanding and was in agreement with plan. Pt is coming to office on Thurs for [facility]madin [facility] appt and would like to have blood drawn on that day.   Advised pt I could move her Coumadin appt to Friday but she stated she would rather have everything scheduled for Thurs. Scheduled labs for Thurs and advised pt to have labs drawn after Coumadin appt. Will route to [employer], PA-C to make him aware that pt is coming Thurs instead of Friday.

## 2015-11-10 NOTE — Patient Outreach (Signed)
[facility]) Care Management   11/10/2015  Patricia Morgan 09/03/1950 [id number]  Patricia Morgan is an 65 y.o. female  Subjective:  HF:  Pt not aware of the HF zone however able to recite some symptoms but limited to what to do if acute symptoms should occur. Pt receptive to teaching today and printed material and very anxious to learn this information. Pt reports she uses a CPAP at night with no problems breathing or resting at night or during the day.  SWELLING: Pt reports she has swelling history to her legs around her ankles. Pt states she has compression stockings and will use as needed however pt always has "puffy ankles" with limited resolution.  NUTRITION: Pt reports she has been eating healthier since her recent hospitalization placement for stent (clots). Pt has requested some information on healthy eating habits. MEDICATIONS: Pt able to verify all her prescribed medications and receptive to a pill box to better organize her medications.  MEDICAL APPOINTMENTS: Pt reports all upcoming medical appointments and has sufficient transportation to the pending appointments. Pt also indicates she has a back up if her car is not working. Sister and friends lives in [place].  Pt reports she wants to have a better quality of live and live longer and willing to make the lifestyle changes. Objective:   Review of Systems  Constitutional: Negative.   HENT: Negative.   Eyes: Negative.   Respiratory: Negative.   Cardiovascular: Positive for leg swelling.       Bilateral swelling history of kidney problems at indicated by pt.  Gastrointestinal: Negative.   Genitourinary: Negative.   Musculoskeletal: Negative.   Skin: Negative.   Neurological: Negative.   Endo/Heme/Allergies: Negative.   Psychiatric/Behavioral: Negative.     Physical Exam  Constitutional: She is oriented to person, place, and time. She appears well-developed and well-nourished.  HENT:  Right Ear:  External ear normal.  Left Ear: External ear normal.  Eyes: EOM are normal.  Neck: Normal range of motion.  Cardiovascular: Normal heart sounds.   Respiratory: Effort normal and breath sounds normal.  GI: Soft. Bowel sounds are normal.  Musculoskeletal: Normal range of motion.  Neurological: She is alert and oriented to person, place, and time.  Skin: Skin is warm and dry.  Psychiatric: She has a normal mood and affect. Her behavior is normal. Judgment and thought content normal.    Encounter Medications:   Outpatient Encounter Prescriptions as of 11/10/2015  Medication Sig  . acetaminophen (TYLENOL) 500 MG tablet Take 500 mg by mouth every 6 (six) hours as needed for mild pain.  [name] allopurinol (ZYLOPRIM) 100 MG tablet Take 100 mg by mouth 2 (two) times daily.   [name] apixaban (ELIQUIS) 5 MG TABS tablet Take 1 tablet (5 mg total) by mouth 2 (two) times daily.  . carvedilol (COREG) 25 MG tablet Take 1 tablet (25 mg total) by mouth 2 (two) times daily with a meal.  . colchicine 0.6 MG tablet Take 0.6 mg by mouth daily.   . furosemide (LASIX) 40 MG tablet Take 1 tablet (40 mg total) by mouth 2 (two) times daily. (Patient taking differently: Take 40 mg by mouth 2 (two) times daily. Currently on HOLD until labs draws on 6/29)  . Insulin Glargine (LANTUS SOLOSTAR) 100 UNIT/ML Solostar Pen Inject 15 Units into the skin 2 (two) times daily. Per sliding scale.  . insulin lispro (HUMALOG KWIKPEN) 100 UNIT/ML KiwkPen Inject 10 Units into the skin 3 (three) times daily.  . rosuvastatin (  CRESTOR) 10 MG tablet Take 10 mg by mouth daily.   No facility-administered encounter medications on file as of 11/10/2015.    Functional Status:   In your present state of health, do you have any difficulty performing the following activities: 11/05/2015 11/01/2015  Hearing? N N  Vision? N N  Difficulty concentrating or making decisions? N N  Walking or climbing stairs? N N  Dressing or bathing? N N  Doing errands,  shopping? N N  Preparing Food and eating ? N -  Using the Toilet? N -  In the past six months, have you accidently leaked urine? N -  Do you have problems with loss of bowel control? N -  Managing your Medications? N -  Managing your Finances? N -  Housekeeping or managing your Housekeeping? N -    Fall/Depression Screening:    PHQ 2/9 Scores 11/05/2015 09/17/2013 04/04/2013 01/08/2013 09/13/2012 07/16/2012 05/30/2012  PHQ - 2 Score 0 0 0 0 0 0 0  BP 128/68 mmHg  Pulse 87  Resp 20  Ht 1.549 m (5\' 1" )  Wt 203 lb (92.08 kg)  BMI 38.38 kg/m2  SpO2 97%  Assessment:  Introduction to the [facility] program and services. Case management related to HF Bilateral swelling related to lower extremities Nutritional issues related salt smart diet Medication adherence related daily medications  Plan:  Physical assessment completed with no acute issues noted today.  Will discussed the [facility] program and services and enrolled pt into a program for management of care. Will present and discuss HF zones and provide printed material with education and teaching for increase of knowledge base for future teach back method. Pt receptive to the teachings today and  aware of the action plan and what to do if acute. Will provide printed material via EMMI for the following: HF-Keeping track of your weight, HF-How to be salt smart and HF-When to call your doctor or 911.  Will education pt on fluid retention and why it is importance for daily weights in monitoring her fluid intake and reiterated on the HF zone with any weight gained of 3 lbs overnight or 5 lbs within one week to contact her provider for possible intervention.  Will discussed health eating habits and discussed food items that will retain fluid based upon the sodium content. Will encouraged pt on healthy eating habits and if she feels she needs a dietitian or nutritionist to consult her provider for a possible referral if needed ( pt with understanding).  Provide chart  on high/low salt content as a guide for lowering her sodium intake. Discussed how this affects her medical HF. Will review all prescribed medications and verify enough supplies and refills. Will educate pt on those medications and stress the importance of her HF medications and the purpose of these medications.  Plan of care and goals discussed with pt understanding of the purpose of managing her care and why this is important. Will re-evaluate pt's progress on the next scheduled home visit in July and notify pt's provider of [facility] involvement.   [name], RN Care Management Coordinator [place] Office [phone]

## 2015-11-11 DIAGNOSIS — I272 Other secondary pulmonary hypertension: Secondary | ICD-10-CM | POA: Diagnosis not present

## 2015-11-11 DIAGNOSIS — E1122 Type 2 diabetes mellitus with diabetic chronic kidney disease: Secondary | ICD-10-CM | POA: Diagnosis not present

## 2015-11-11 DIAGNOSIS — N183 Chronic kidney disease, stage 3 (moderate): Secondary | ICD-10-CM | POA: Diagnosis not present

## 2015-11-11 DIAGNOSIS — Z794 Long term (current) use of insulin: Secondary | ICD-10-CM | POA: Diagnosis not present

## 2015-11-11 DIAGNOSIS — I129 Hypertensive chronic kidney disease with stage 1 through stage 4 chronic kidney disease, or unspecified chronic kidney disease: Secondary | ICD-10-CM | POA: Diagnosis not present

## 2015-11-11 DIAGNOSIS — I428 Other cardiomyopathies: Secondary | ICD-10-CM | POA: Diagnosis not present

## 2015-11-12 ENCOUNTER — Ambulatory Visit (INDEPENDENT_AMBULATORY_CARE_PROVIDER_SITE_OTHER): Payer: Commercial Managed Care - HMO | Admitting: *Deleted

## 2015-11-12 ENCOUNTER — Other Ambulatory Visit (INDEPENDENT_AMBULATORY_CARE_PROVIDER_SITE_OTHER): Payer: Commercial Managed Care - HMO | Admitting: *Deleted

## 2015-11-12 DIAGNOSIS — R748 Abnormal levels of other serum enzymes: Secondary | ICD-10-CM | POA: Diagnosis not present

## 2015-11-12 DIAGNOSIS — I82431 Acute embolism and thrombosis of right popliteal vein: Secondary | ICD-10-CM

## 2015-11-12 DIAGNOSIS — R799 Abnormal finding of blood chemistry, unspecified: Secondary | ICD-10-CM

## 2015-11-12 DIAGNOSIS — I2699 Other pulmonary embolism without acute cor pulmonale: Secondary | ICD-10-CM

## 2015-11-12 DIAGNOSIS — R7989 Other specified abnormal findings of blood chemistry: Secondary | ICD-10-CM

## 2015-11-12 LAB — BASIC METABOLIC PANEL
BUN: 42 mg/dL — ABNORMAL HIGH (ref 7–25)
CALCIUM: 9.5 mg/dL (ref 8.6–10.4)
CO2: 24 mmol/L (ref 20–31)
CREATININE: 2.38 mg/dL — AB (ref 0.50–0.99)
Chloride: 104 mmol/L (ref 98–110)
Glucose, Bld: 224 mg/dL — ABNORMAL HIGH (ref 65–99)
Potassium: 4.7 mmol/L (ref 3.5–5.3)
SODIUM: 138 mmol/L (ref 135–146)

## 2015-11-12 LAB — CBC WITH DIFFERENTIAL/PLATELET
BASOS ABS: 60 {cells}/uL (ref 0–200)
Basophils Relative: 1 %
EOS ABS: 180 {cells}/uL (ref 15–500)
EOS PCT: 3 %
HCT: 35 % (ref 35.0–45.0)
Hemoglobin: 11.4 g/dL — ABNORMAL LOW (ref 11.7–15.5)
LYMPHS PCT: 32 %
Lymphs Abs: 1920 cells/uL (ref 850–3900)
MCH: 30 pg (ref 27.0–33.0)
MCHC: 32.6 g/dL (ref 32.0–36.0)
MCV: 92.1 fL (ref 80.0–100.0)
MONOS PCT: 6 %
MPV: 11.9 fL (ref 7.5–12.5)
Monocytes Absolute: 360 cells/uL (ref 200–950)
Neutro Abs: 3480 cells/uL (ref 1500–7800)
Neutrophils Relative %: 58 %
PLATELETS: 237 10*3/uL (ref 140–400)
RBC: 3.8 MIL/uL (ref 3.80–5.10)
RDW: 14.7 % (ref 11.0–15.0)
WBC: 6 10*3/uL (ref 3.8–10.8)

## 2015-11-12 NOTE — Progress Notes (Signed)
Pt was started on Eliquis 5mg s BID for DVT/PE on 10/17/15.    Reviewed patients medication list.  Pt is not currently on any combined P-gp and strong CYP3A4 inhibitors/inducers (ketoconazole, traconazole, ritonavir, carbamazepine, phenytoin, rifampin, St. John's wort).  Reviewed labs:  SCr 2.38, Weight 99.5Kg, CrCl-36.52. Age 65 yrs old.  Dose appropriate based on specified criteria. Hgb and HCT 11.4/35.0. 11/13/15- Call pt and discussed lab results we drew for Eliquis dosing.   A full discussion of the nature of anticoagulants has been carried out.  A benefit/risk analysis has been presented to the patient, so that they understand the justification for choosing anticoagulation with Eliquis at this time.  The need for compliance is stressed.  Pt is aware to take the medication twice daily.  Side effects of potential bleeding are discussed, including unusual colored urine or stools, coughing up blood or coffee ground emesis, nose bleeds or serious fall or head trauma.  Discussed signs and symptoms of stroke. The patient should avoid any OTC items containing aspirin or ibuprofen.  Avoid alcohol consumption.   Call if any signs of abnormal bleeding.  Discussed financial obligations and resolved any difficulty in obtaining medication.  Next lab test, BMET in 2 weeks on 11/26/15 at 1230pm.

## 2015-11-13 ENCOUNTER — Telehealth: Payer: Self-pay | Admitting: *Deleted

## 2015-11-13 DIAGNOSIS — I10 Essential (primary) hypertension: Secondary | ICD-10-CM

## 2015-11-13 MED ORDER — FUROSEMIDE 40 MG PO TABS: ORAL_TABLET | ORAL | Status: DC

## 2015-11-13 NOTE — Telephone Encounter (Signed)
Called pt, per Almyra Deforest, PA-C.  Advised pt that her lab had improved and he recommend the pt to restart lasix 40 mg 1 daily and if pt notices wt gain of 3lbs in 1 day or 5 lbs in 1 week, she can take another dose of lasix.  Pt will repeat bmet 11/27/15 Pt agreeable with this plan.

## 2015-11-13 NOTE — Telephone Encounter (Signed)
Covering for Vin as he is off today, patient's kidney function improved based on the lastest lab. Review of the previous note shows patient was on 20mg  daily lasix prior to last admission and this dose was increased to 40mg  BID prior to discharge. The 40mg  BID dose may be too much for her. Recommend scale back to 40mg  daily with instruction of taking additional dose in the afternoon if weight increase by more than 3 lbs overnight or 5 lbs in a single weeks. Recheck BMET in 2 weeks

## 2015-11-19 ENCOUNTER — Other Ambulatory Visit: Payer: Self-pay | Admitting: *Deleted

## 2015-11-19 DIAGNOSIS — G4733 Obstructive sleep apnea (adult) (pediatric): Secondary | ICD-10-CM | POA: Diagnosis not present

## 2015-11-19 NOTE — Patient Outreach (Signed)
Covering for Patricia Morgan, weekly transition of care call placed to member.  She state she is "doing good."  She denies any shortness of breath, leg swelling, or discomfort.  She denies any weight gain, stating her weight yesterday was 207 pounds.  She verbalizes understanding of needing to contact the physician if her weight has increased more than 3 pounds overnight and 5 pounds in a week.  She confirms her cardiology appointment for tomorrow, transportation confirmed.  She state that she has scheduled an appointment with Dr. [name] for September, stating that he will be her new PCP.    She report she is taking all of her medications as prescribed, denies any questions or concerns at this time.  She is made aware that this care manager will provide assigned care manager with update and she will follow up next week.  Patricia Morgan, [place], MSN [place] [phone]

## 2015-11-20 ENCOUNTER — Ambulatory Visit (INDEPENDENT_AMBULATORY_CARE_PROVIDER_SITE_OTHER): Payer: Commercial Managed Care - HMO | Admitting: Cardiovascular Disease

## 2015-11-20 ENCOUNTER — Encounter: Payer: Self-pay | Admitting: Cardiovascular Disease

## 2015-11-20 VITALS — BP 128/70 | HR 86 | Ht 61.0 in | Wt 218.2 lb

## 2015-11-20 DIAGNOSIS — E669 Obesity, unspecified: Secondary | ICD-10-CM

## 2015-11-20 DIAGNOSIS — I5023 Acute on chronic systolic (congestive) heart failure: Secondary | ICD-10-CM | POA: Diagnosis not present

## 2015-11-20 DIAGNOSIS — I10 Essential (primary) hypertension: Secondary | ICD-10-CM | POA: Diagnosis not present

## 2015-11-20 DIAGNOSIS — N184 Chronic kidney disease, stage 4 (severe): Secondary | ICD-10-CM

## 2015-11-20 DIAGNOSIS — I2699 Other pulmonary embolism without acute cor pulmonale: Secondary | ICD-10-CM | POA: Diagnosis not present

## 2015-11-20 DIAGNOSIS — G4733 Obstructive sleep apnea (adult) (pediatric): Secondary | ICD-10-CM

## 2015-11-20 DIAGNOSIS — R0602 Shortness of breath: Secondary | ICD-10-CM

## 2015-11-20 MED ORDER — HYDRALAZINE HCL 10 MG PO TABS: 10.0000 mg | ORAL_TABLET | Freq: Three times a day (TID) | ORAL | Status: DC

## 2015-11-20 MED ORDER — ISOSORBIDE MONONITRATE ER 30 MG PO TB24: 30.0000 mg | ORAL_TABLET | Freq: Every day | ORAL | Status: DC

## 2015-11-20 MED ORDER — ISOSORBIDE MONONITRATE ER 30 MG PO TB24: 30.0000 mg | ORAL_TABLET | Freq: Three times a day (TID) | ORAL | Status: DC

## 2015-11-20 NOTE — Patient Instructions (Signed)
Dr Sallyanne Kuster has recommended making the following medication changes: 1. START Isosorbide Mononitrate 30 mg - take 1 tablet by mouth daily 2. START Hydralazine 10 mg - take 1 tablet by mouth three times daily  Dr Sallyanne Kuster recommends that you schedule a follow-up appointment first available.  If you need a refill on your cardiac medications before your next appointment, please call your pharmacy.

## 2015-11-22 NOTE — Progress Notes (Signed)
Cardiology Office Note    Date:  11/22/2015   ID:  Socorra Baltes, DOB [birth year], MRN [id number]  PCP:  Kandice Hams, MD  Cardiologist:   Sanda Klein, MD   Chief Complaint  Patient presents with  . Follow-up    History of Present Illness:  Patricia Morgan is a 65 y.o. female recently hospitalized for acute exacerbation of chronic systolic heart failure due to nonischemic cardiomyopathy, superimposed on obstructive sleep apnea, diabetes mellitus, hyperlipidemia, chronic kidney disease stage III, recurrent venous thromboembolic events (factor V Leiden) and long-standing systemic hypertension. It is suspected previous chemotherapy with Adriamycin mass contributing to her left ventricular dysfunction.  Coronary angiography performed June 2017 showed no evidence of coronary artery disease but confirmed elevated filling pressure. PA pressure was 57/18 in the setting of a mean pulmonary wedge pressure of 33 mmHg. Echo showed ejection fraction of 45%. Nuclear scintigraphy calculated ejection fraction of 38%. It also suggested the presence of perfusion abnormalities, false positive by coronary angiography.  She feels much better than at her previous evaluation. She denies any exertional dyspnea, orthopnea, PND, leg edema, chest discomfort, palpitations, syncope or dizziness. She denies focal neurological events and has not had any bleeding problems. She has not had any gout episodes. She reports compliance with CPAP.  She is currently on treatment with maximum dose of carvedilol and a relatively low dose of furosemide. She is not on RAAS inhibitors due to moderately advanced kidney disease (estimated GFR 24, baseline creatinine around 2.3).  Diabetes control is excellent with a hemoglobin A1c of 6.0%.  She's planning 3 teeth extraction with Dr. Benedetto Goad.  Past Medical History  Diagnosis Date  . OBSTRUCTIVE SLEEP APNEA     CPAP  . PULMONARY EMBOLISM 09/24/2008    anticoag thru  03/2010  . Proteinuria   . BREAST CANCER, HX OF 1995    Right Breast  . ANEMIA-NOS   . DIABETES MELLITUS, TYPE II   . HYPERLIPIDEMIA   . HYPERTENSION   . CHRONIC KIDNEY DISEASE STAGE III (MODERATE)     Past Surgical History  Procedure Laterality Date  . Mastectomy  1995    Right  . Insertion port a cath    . Port-a-cath removal    . Cardiac catheterization N/A 11/02/2015    Procedure: Right/Left Heart Cath and Coronary Angiography;  Surgeon: Burnell Blanks, MD;  Location: [facility];  Service: Cardiovascular;  Laterality: N/A;    Current Medications: Outpatient Prescriptions Prior to Visit  Medication Sig Dispense Refill  . acetaminophen (TYLENOL) 500 MG tablet Take 500 mg by mouth every 6 (six) hours as needed for mild pain.    [name] allopurinol (ZYLOPRIM) 100 MG tablet Take 100 mg by mouth 2 (two) times daily.     [name] apixaban (ELIQUIS) 5 MG TABS tablet Take 1 tablet (5 mg total) by mouth 2 (two) times daily. 60 tablet 6  . carvedilol (COREG) 25 MG tablet Take 1 tablet (25 mg total) by mouth 2 (two) times daily with a meal. 60 tablet 6  . colchicine 0.6 MG tablet Take 0.6 mg by mouth daily.   1  . furosemide (LASIX) 40 MG tablet Take 1 tablet daily, take 1 extra tablet as needed for wt increase of 3 lbs in 1 day or 5 lbs in a week 60 tablet 3  . Insulin Glargine (LANTUS SOLOSTAR) 100 UNIT/ML Solostar Pen Inject 15 Units into the skin 2 (two) times daily. Per sliding scale.    [name]  insulin lispro (HUMALOG KWIKPEN) 100 UNIT/ML KiwkPen Inject 10 Units into the skin 3 (three) times daily.    . rosuvastatin (CRESTOR) 10 MG tablet Take 10 mg by mouth daily.     No facility-administered medications prior to visit.     Allergies:   Review of patient's allergies indicates no known allergies.   Social History   Social History  . Marital Status: Divorced    Spouse Name: N/A  . Number of Children: N/A  . Years of Education: 53   Social History Main Topics  . Smoking  status: Never Smoker   . Smokeless tobacco: None     Comment: Lives alone-divorced  . Alcohol Use: No  . Drug Use: No  . Sexual Activity: Not Asked   Other Topics Concern  . None   Social History Narrative     Family History:  The patient's family history includes Diabetes in her sister. Her sister had long QT syndrome, but no documented ventricular arrhythmia  ROS:   Please see the history of present illness.    ROS All other systems reviewed and are negative.   PHYSICAL EXAM:   VS:  BP 128/70 mmHg  Pulse 86  Ht 5\' 1"  (1.549 m)  Wt 98.975 kg (218 lb 3.2 oz)  BMI 41.25 kg/m2   GEN: Well nourished, well developed, in no acute distress HEENT: normal Neck: no JVD, carotid bruits, or masses Cardiac: Mild lateral displacement of the apical impulse, RRR; no murmurs, rubs, or gallops,no edema  Respiratory:  clear to auscultation bilaterally, normal work of breathing GI: soft, nontender, nondistended, + BS MS: no deformity or atrophy Skin: warm and dry, no rash Neuro:  Alert and Oriented x 3, Strength and sensation are intact Psych: euthymic mood, full affect  Wt Readings from Last 3 Encounters:  11/20/15 98.975 kg (218 lb 3.2 oz)  11/10/15 92.08 kg (203 lb)  11/04/15 100.154 kg (220 lb 12.8 oz)      Studies/Labs Reviewed:   EKG:  EKG is ordered today.  The ekg ordered today demonstrates Sinus rhythm with mild first-degree A-V block (PR 228 ms), left ventricular hypertrophy by voltage only  Recent Labs: 08/30/2015: B Natriuretic Peptide 12.2 10/05/2015: ALT 19 11/12/2015: BUN 42*; Creat 2.38*; Hemoglobin 11.4*; Platelets 237; Potassium 4.7; Sodium 138   Lipid Panel    Component Value Date/Time   CHOL 184 09/13/2012 1357   TRIG 142 09/13/2012 1357   HDL 45 09/13/2012 1357   CHOLHDL 4.1 09/13/2012 1357   VLDL 28 09/13/2012 1357   LDLCALC 111* 09/13/2012 1357   LDLDIRECT 141.3 10/14/2008 1004      ASSESSMENT:    1. Acute on chronic systolic CHF (congestive heart  failure) ([place])   2. SOB (shortness of breath)   3. Other pulmonary embolism without acute cor pulmonale, unspecified chronicity ([place])   4. Benign essential HTN   5. Obstructive sleep apnea   6. CKD (chronic kidney disease), stage IV ([place])   7. Obesity      PLAN:  In order of problems listed above:  1. CHF: The exact cause and severity of her cardiomyopathy is not clear, but hypertension and Adriamycin are likely playing a role. No coronary disease by angiography. Continue carvedilol. Add hydralazine/nitrates and titrate up as tolerated. Avoiding ACE inhibitor/ARB/spironolactone due to renal insufficiency 2. Dyspnea: Right heart catheterization shows severely elevated left heart filling pressures as the major cause of her dyspnea. The trans-pulmonary gradient was not elevated, suggesting that previous pulmonary embolism and  obstructive sleep apnea have not played a major role in her pulmonary hypertension 3. Recurrent DVT/PE:  hypercoagulable state due to heterozygous factor V Leiden. Should remain on lifelong anticoagulation. It should be low risk to interrupt her anticoagulant for 2 days for her tooth extraction. 4. HTN: Well controlled 5. OSA: Reports compliance with CPAP. We'll see if her fatigue improves with better heart failure therapy, if not may need repeat titration study. 6. CKD: Very mild increase in creatinine after cardiac catheterization and diuresis, now back to baseline 7. Obesity: This is probably playing a role in her tendency to have recurrent DVT as well as the presence of sleep apnea and heart failure. Weight loss is recommended    Medication Adjustments/Labs and Tests Ordered: Current medicines are reviewed at length with the patient today.  Concerns regarding medicines are outlined above.  Medication changes, Labs and Tests ordered today are listed in the Patient Instructions below. Patient Instructions  Dr Sallyanne Kuster has recommended making the following medication  changes: 1. START Isosorbide Mononitrate 30 mg - take 1 tablet by mouth daily 2. START Hydralazine 10 mg - take 1 tablet by mouth three times daily  Dr Sallyanne Kuster recommends that you schedule a follow-up appointment first available.  If you need a refill on your cardiac medications before your next appointment, please call your pharmacy.    Signed, Sanda Klein, MD  11/22/2015 3:08 PM    [facility] [facility], [place], [place]  [address] Phone: [phone]; Fax: [phone]

## 2015-11-23 ENCOUNTER — Other Ambulatory Visit: Payer: Self-pay | Admitting: *Deleted

## 2015-11-23 ENCOUNTER — Other Ambulatory Visit: Payer: Self-pay | Admitting: Cardiovascular Disease

## 2015-11-23 MED ORDER — ISOSORBIDE MONONITRATE ER 30 MG PO TB24: 30.0000 mg | ORAL_TABLET | Freq: Every day | ORAL | Status: DC

## 2015-11-23 MED ORDER — HYDRALAZINE HCL 10 MG PO TABS: 10.0000 mg | ORAL_TABLET | Freq: Three times a day (TID) | ORAL | Status: DC

## 2015-11-24 NOTE — Addendum Note (Signed)
Addended by: Cristopher Estimable on: 11/24/2015 09:00 AM   Modules accepted: Orders

## 2015-11-26 ENCOUNTER — Other Ambulatory Visit: Payer: Self-pay | Admitting: *Deleted

## 2015-11-26 ENCOUNTER — Other Ambulatory Visit (INDEPENDENT_AMBULATORY_CARE_PROVIDER_SITE_OTHER): Payer: Commercial Managed Care - HMO | Admitting: *Deleted

## 2015-11-26 DIAGNOSIS — I82431 Acute embolism and thrombosis of right popliteal vein: Secondary | ICD-10-CM | POA: Diagnosis not present

## 2015-11-26 DIAGNOSIS — I2699 Other pulmonary embolism without acute cor pulmonale: Secondary | ICD-10-CM | POA: Diagnosis not present

## 2015-11-26 DIAGNOSIS — C50911 Malignant neoplasm of unspecified site of right female breast: Secondary | ICD-10-CM | POA: Diagnosis not present

## 2015-11-26 LAB — BASIC METABOLIC PANEL
BUN: 40 mg/dL — ABNORMAL HIGH (ref 7–25)
CHLORIDE: 109 mmol/L (ref 98–110)
CO2: 23 mmol/L (ref 20–31)
CREATININE: 2.56 mg/dL — AB (ref 0.50–0.99)
Calcium: 8.9 mg/dL (ref 8.6–10.4)
Glucose, Bld: 213 mg/dL — ABNORMAL HIGH (ref 65–99)
Potassium: 4.7 mmol/L (ref 3.5–5.3)
SODIUM: 139 mmol/L (ref 135–146)

## 2015-11-26 NOTE — Addendum Note (Signed)
Addended by: Eulis Foster on: 11/26/2015 11:06 AM   Modules accepted: Orders

## 2015-11-26 NOTE — Addendum Note (Signed)
Addended by: Eulis Foster on: 11/26/2015 11:07 AM   Modules accepted: Orders

## 2015-11-26 NOTE — Patient Outreach (Signed)
[facility]) Care Management  11/26/2015  Patricia Morgan 1950/08/11 [id number]   RN spoke with pt today who reports she is doing "fine". States a recent follow up with her provider has granted her permission to take extra  Lasix if she gains 5 lbs over her baseline weight. If no reduction in her weight or fluid retention pt is to contact her provider. Pt reports her weight today at 215 lbs, yesterday 214 lbs and last week 214 lbs with no signs or related HF symptoms. Pt remains in the GREEN zone with acute events. Pt states she is on several BP and HF medication and really not sure why. RN educated pt on her Coreg, Imdur and Apresoline concerning the purpose of each drug. RN reviewed all medications and verified pt's understanding of each medication. Strongly encouraged pt on her adherence with her ongoing medications and medical appointments as discussed and reviewed on her plan of care. Will inquire on ongoing follow up transition of care calls and verified the upcoming home visit later in July. Pt aware to contact this RN if additional inquires are needed concerning her ongoing care. In additional verified pt can contact the 24 hr hotline via [facility] or her providers office if after hours and she is having problems however if acute pt aware to seek medical attention immediately.  [name], RN Care Management Coordinator [place] Office [phone]

## 2015-11-30 ENCOUNTER — Other Ambulatory Visit: Payer: Self-pay | Admitting: *Deleted

## 2015-11-30 NOTE — Patient Outreach (Signed)
[place] [facility]) Care Management  11/30/2015  Patricia Morgan [birth year] [id number]   Transition of care  RN spoke with pt today and reiterated on the plan of care/goals reviewed and RN inquired on pt's weights. Pt reports her weight today was 215 lbs, yesterday 215 lbs and last week 214 lbs with no swelling or reported issues. Pt states she feels she is "getting back to her old self". Pt remains in the GREEN zone and receptive to the home visit/transition of care contact next week all in one on the pending home visit. Offered any needed resources as pt declined indicating she is doing "fine". RN will follow up next week accordingly with ongoing case management services in assisting this pt with managing her HF.   [name], RN Care Management Coordinator [place] Office [phone]

## 2015-12-07 ENCOUNTER — Telehealth: Payer: Self-pay

## 2015-12-07 NOTE — Telephone Encounter (Signed)
-----   Message from Sanda Klein, MD sent at 11/22/2015  3:22 PM EDT ----- Letter for Dr. Renetta Chalk in Epic addressed to Dr. Rockwell Germany (could not find a dentist in [place]). Please forward

## 2015-12-07 NOTE — Telephone Encounter (Signed)
Faxed clearance letter for dental procedure to Dr Jackson Latino office.

## 2015-12-08 ENCOUNTER — Encounter: Payer: Self-pay | Admitting: *Deleted

## 2015-12-09 ENCOUNTER — Other Ambulatory Visit: Payer: Self-pay | Admitting: *Deleted

## 2015-12-09 NOTE — Patient Outreach (Signed)
[name]) Care Management   12/09/2015  Patricia Morgan [birth year] [id number]  Patricia Morgan is an 65 y.o. female  Subjective:  HF: Pt reports she continues to monitor her daily weights and document all readings for her providers to view.  Pt reports weights of 216 lbs today and 214 lbs yesterday and last week with no symptoms of HF. Pt able to recite some of the signs of HF and what to do if acute symptoms should occur. Pt has a action plan in place to take an extra Lasix of 40 mg with 3 lbs over night and 5 lbs within one week per Dr. Sallyanne Kuster who is manages her HF MEDICATION: Pt confirms she is managing her medications with no problems. 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  MEDICAL APPOINTMENTS: Pt reports she attends all medical appointments with no delays. Pt continues to confirm an upcoming appointment with her new primary Dr. [name] in September.   Objective:   Review of Systems  All other systems reviewed and are negative.   Physical Exam  Nursing note and vitals reviewed.   Encounter Medications:   Outpatient Encounter Prescriptions as of 12/09/2015  Medication Sig  . acetaminophen (TYLENOL) 500 MG tablet Take 500 mg by mouth every 6 (six) hours as needed for mild pain.  [name] allopurinol (ZYLOPRIM) 100 MG tablet Take 100 mg by mouth 2 (two) times daily.   [name] apixaban (ELIQUIS) 5 MG TABS tablet Take 1 tablet (5 mg total) by mouth 2 (two) times daily.  . carvedilol (COREG) 25 MG tablet Take 1 tablet (25 mg total) by mouth 2 (two) times daily with a meal.  . colchicine 0.6 MG tablet Take 0.6 mg by mouth daily.   . furosemide (LASIX) 40 MG tablet Take 1 tablet daily, take 1 extra tablet as needed for wt increase of 3 lbs in 1 day or 5 lbs in a week  . hydrALAZINE (APRESOLINE) 10 MG tablet TAKE 1 TABLET(10 MG) BY MOUTH THREE TIMES DAILY  . Insulin Glargine (LANTUS SOLOSTAR) 100 UNIT/ML Solostar Pen Inject 15 Units into  the skin 2 (two) times daily. Per sliding scale.  . insulin lispro (HUMALOG KWIKPEN) 100 UNIT/ML KiwkPen Inject 10 Units into the skin 3 (three) times daily.  . isosorbide mononitrate (IMDUR) 30 MG 24 hr tablet TAKE 1 TABLET(30 MG) BY MOUTH DAILY  . rosuvastatin (CRESTOR) 10 MG tablet Take 10 mg by mouth daily.   No facility-administered encounter medications on file as of 12/09/2015.     Functional Status:   In your present state of health, do you have any difficulty performing the following activities: 11/05/2015 11/01/2015  Hearing? N N  Vision? N N  Difficulty concentrating or making decisions? N N  Walking or climbing stairs? N N  Dressing or bathing? N N  Doing errands, shopping? N N  Preparing Food and eating ? N -  Using the Toilet? N -  In the past six months, have you accidently leaked urine? N -  Do you have problems with loss of bowel control? N -  Managing your Medications? N -  Managing your Finances? N -  Housekeeping or managing your Housekeeping? N -  Some recent data might be hidden    Fall/Depression Screening:    PHQ 2/9 Scores 11/05/2015 09/17/2013 04/04/2013 01/08/2013 09/13/2012 07/16/2012 05/30/2012  PHQ - 2 Score 0 0 0 0 0 0 0    Assessment:   Ongoing case management related  to HF Follow up on medical appointments Follow up on medication adherence  Plan:  Will completed a physical assessment and confirm pt continues to monitor her daily weights and document all readings. Pt now has a action plan for a weight gain of 3 lbs over night or 5 lbs within one week to take an extra 40 mg of Lasix (noted on medication review). Verified pt is comfortable with this action plan.  Will confirm pt's continues to attend all medical appointments pending new primary provider in September (Dr. [name]). Will continue to encourage adherence with attending all medical appointments. Will confirm pt's medications and verify no refills are needed for ongoing adherence.  Plan of care  discussed and goals adjusted accordingly. Will re-evaluate next month on pt's progress.   [name], RN Care Management Coordinator [place] Office [phone]

## 2015-12-14 ENCOUNTER — Other Ambulatory Visit: Payer: Commercial Managed Care - HMO

## 2015-12-14 ENCOUNTER — Ambulatory Visit: Payer: Commercial Managed Care - HMO | Admitting: Hematology & Oncology

## 2015-12-17 ENCOUNTER — Ambulatory Visit (HOSPITAL_BASED_OUTPATIENT_CLINIC_OR_DEPARTMENT_OTHER): Payer: Commercial Managed Care - HMO | Admitting: Hematology & Oncology

## 2015-12-17 ENCOUNTER — Encounter: Payer: Self-pay | Admitting: Hematology & Oncology

## 2015-12-17 ENCOUNTER — Other Ambulatory Visit (HOSPITAL_BASED_OUTPATIENT_CLINIC_OR_DEPARTMENT_OTHER): Payer: Commercial Managed Care - HMO

## 2015-12-17 VITALS — BP 118/59 | HR 78 | Temp 98.0°F | Resp 18 | Ht 62.0 in | Wt 217.0 lb

## 2015-12-17 DIAGNOSIS — I2699 Other pulmonary embolism without acute cor pulmonale: Secondary | ICD-10-CM

## 2015-12-17 DIAGNOSIS — N179 Acute kidney failure, unspecified: Secondary | ICD-10-CM

## 2015-12-17 DIAGNOSIS — N183 Chronic kidney disease, stage 3 unspecified: Secondary | ICD-10-CM

## 2015-12-17 DIAGNOSIS — D6851 Activated protein C resistance: Secondary | ICD-10-CM | POA: Diagnosis not present

## 2015-12-17 DIAGNOSIS — E0822 Diabetes mellitus due to underlying condition with diabetic chronic kidney disease: Secondary | ICD-10-CM

## 2015-12-17 DIAGNOSIS — I749 Embolism and thrombosis of unspecified artery: Secondary | ICD-10-CM

## 2015-12-17 DIAGNOSIS — Z853 Personal history of malignant neoplasm of breast: Secondary | ICD-10-CM | POA: Diagnosis not present

## 2015-12-17 LAB — CBC WITH DIFFERENTIAL (CANCER CENTER ONLY)
BASO#: 0 10*3/uL (ref 0.0–0.2)
BASO%: 0.9 % (ref 0.0–2.0)
EOS%: 2.7 % (ref 0.0–7.0)
Eosinophils Absolute: 0.1 10*3/uL (ref 0.0–0.5)
HCT: 34.7 % — ABNORMAL LOW (ref 34.8–46.6)
HGB: 11.4 g/dL — ABNORMAL LOW (ref 11.6–15.9)
LYMPH#: 1.4 10*3/uL (ref 0.9–3.3)
LYMPH%: 41.9 % (ref 14.0–48.0)
MCH: 31.1 pg (ref 26.0–34.0)
MCHC: 32.9 g/dL (ref 32.0–36.0)
MCV: 95 fL (ref 81–101)
MONO#: 0.3 10*3/uL (ref 0.1–0.9)
MONO%: 7.8 % (ref 0.0–13.0)
NEUT#: 1.6 10*3/uL (ref 1.5–6.5)
NEUT%: 46.7 % (ref 39.6–80.0)
Platelets: 200 10*3/uL (ref 145–400)
RBC: 3.67 10*6/uL — AB (ref 3.70–5.32)
RDW: 14.4 % (ref 11.1–15.7)
WBC: 3.3 10*3/uL — AB (ref 3.9–10.0)

## 2015-12-17 LAB — COMPREHENSIVE METABOLIC PANEL
ALT: 23 U/L (ref 0–55)
AST: 26 U/L (ref 5–34)
Albumin: 3.6 g/dL (ref 3.5–5.0)
Alkaline Phosphatase: 132 U/L (ref 40–150)
Anion Gap: 9 mEq/L (ref 3–11)
BILIRUBIN TOTAL: 0.45 mg/dL (ref 0.20–1.20)
BUN: 45.8 mg/dL — ABNORMAL HIGH (ref 7.0–26.0)
CO2: 23 meq/L (ref 22–29)
CREATININE: 2.7 mg/dL — AB (ref 0.6–1.1)
Calcium: 9.8 mg/dL (ref 8.4–10.4)
Chloride: 109 mEq/L (ref 98–109)
EGFR: 21 mL/min/{1.73_m2} — ABNORMAL LOW (ref >90)
GLUCOSE: 158 mg/dL — AB (ref 70–140)
Potassium: 5 mEq/L (ref 3.5–5.1)
SODIUM: 141 meq/L (ref 136–145)
TOTAL PROTEIN: 7.3 g/dL (ref 6.4–8.3)

## 2015-12-17 NOTE — Progress Notes (Signed)
Hematology and Oncology Follow Up Visit  Aviva Wolfer [id number] Jan 30, 1951 65 y.o. 12/17/2015   Principle Diagnosis:   Recurrent Thromboembolic disease  Factor V Leiden - Heterozygote  H/O locally recurrent ductal carcinoma - RIGHT breast  Current Therapy:    Eliquis 5 mg po BID     Interim History:  Ms. Daffron is back for a second office visit. We first saw her back in May. Surprisingly enough, we found out that she is heterozygote for the Factor V Leiden mutation.  We now have her on ELIQUIS. She is really happy about being on ELIQUIS. It gives her a lot more "Freedom" to be oh to do things. She has family up in [place] that she wants to visit.  How long she needs to be on ELIQUIS is hard to say but I think she may have to be on ELIQUIS long-term. She does have underlying cardiomyopathy.  She has had no bleeding. There's been no change in bowel or bladder habits. She has had no problems with headache. There is no cough.  She is not on dialysis. Her last creatinine just 3 weeks ago was 2.56.  She has had no issues with rashes. His been no leg swelling. She's had no joint problems.  With a history of breast cancer when she was only 65 years old, I thought that we should test her for BRCA analysis.  Currently, [name].    Medications:  Current Outpatient Prescriptions:  .  acetaminophen (TYLENOL) 500 MG tablet, Take 500 mg by mouth every 6 (six) hours as needed for mild pain., Disp: , Rfl:  .  allopurinol (ZYLOPRIM) 100 MG tablet, Take 100 mg by mouth 2 (two) times daily. , Disp: , Rfl:  .  apixaban (ELIQUIS) 5 MG TABS tablet, Take 1 tablet (5 mg total) by mouth 2 (two) times daily., Disp: 60 tablet, Rfl: 6 .  carvedilol (COREG) 25 MG tablet, Take 1 tablet (25 mg total) by mouth 2 (two) times daily with a meal., Disp: 60 tablet, Rfl: 6 .  colchicine 0.6 MG tablet, Take 0.6 mg by mouth daily. , Disp: , Rfl: 1 .  furosemide (LASIX) 40 MG  tablet, Take 1 tablet daily, take 1 extra tablet as needed for wt increase of 3 lbs in 1 day or 5 lbs in a week, Disp: 60 tablet, Rfl: 3 .  hydrALAZINE (APRESOLINE) 10 MG tablet, TAKE 1 TABLET(10 MG) BY MOUTH THREE TIMES DAILY, Disp: 270 tablet, Rfl: 3 .  Insulin Glargine (LANTUS SOLOSTAR) 100 UNIT/ML Solostar Pen, Inject 15 Units into the skin 2 (two) times daily. Per sliding scale., Disp: , Rfl:  .  insulin lispro (HUMALOG KWIKPEN) 100 UNIT/ML KiwkPen, Inject 10 Units into the skin 3 (three) times daily., Disp: , Rfl:  .  isosorbide mononitrate (IMDUR) 30 MG 24 hr tablet, TAKE 1 TABLET(30 MG) BY MOUTH DAILY, Disp: 90 tablet, Rfl: 3 .  rosuvastatin (CRESTOR) 10 MG tablet, Take 10 mg by mouth daily., Disp: , Rfl:   Allergies: No Known Allergies  Past Medical History, Surgical history, Social history, and Family History were reviewed and updated.  Review of Systems: As above  Physical Exam:  height is 5' 2"  (1.575 m) and weight is 217 lb (98.4 kg). Her oral temperature is 98 F (36.7 C). Her blood pressure is 118/59 (abnormal) and her pulse is 78. Her respiration is 18.   Wt Readings from Last 3 Encounters:  12/17/15 217 lb (98.4 kg)  12/09/15 216 lb (98 kg)  11/20/15 218 lb 3.2 oz (99 kg)     Head and neck exam shows no ocular or oral lesions. She has no palpable cervical or supraclavicular lymph nodes. Lungs are clear bilaterally. Cardiac exam regular rate and rhythm with no murmurs, rubs or bruits. Abdomen is soft. She has good bowel sounds. There is no fluid wave. There is a palpable liver or spleen tip. Breast exam shows left breast no masses, edema or erythema. There is no left axillary adenopathy. Right chest wall shows well-healed mastectomy. There is no right chest wall masses. There is no right chest wall edema. There is no right axillary adenopathy. Extremities shows some chronic lymphedema of the right arm. Lower extremities shows chronic mild nonpitting edema of the lower legs.  No venous cord is noted. She has negative Homans sign. Skin exam shows no rashes, ecchymoses or petechia. Neurological exam shows no focal neurological deficits.   Lab Results  Component Value Date   WBC 3.3 (L) 12/17/2015   HGB 11.4 (L) 12/17/2015   HCT 34.7 (L) 12/17/2015   MCV 95 12/17/2015   PLT 200 12/17/2015     Chemistry      Component Value Date/Time   NA 139 11/26/2015 1107   NA 137 10/05/2015 1438   K 4.7 11/26/2015 1107   K 4.1 10/05/2015 1438   CL 109 11/26/2015 1107   CL 105 10/05/2015 1438   CO2 23 11/26/2015 1107   CO2 24 10/05/2015 1438   BUN 40 (H) 11/26/2015 1107   BUN 27 10/05/2015 1438   CREATININE 2.56 (H) 11/26/2015 1107      Component Value Date/Time   CALCIUM 8.9 11/26/2015 1107   CALCIUM 9.6 10/05/2015 1438   ALKPHOS 118 (H) 10/05/2015 1438   AST 24 10/05/2015 1438   ALT 19 10/05/2015 1438   BILITOT 0.3 10/05/2015 1438         Impression and Plan: Ms. Inboden is a 65 year old Afro-American female. She has recurrent thromboembolic disease. She now is on ELIQUIS. She is heterozygous for the factor V Leiden mutation. This is quite unusual in the African-American population.  Again, I think given her history of recurrent disease, we probably have to consider lifelong ELIQUIS. She does have other risk factors for thromboembolic disease which I think will increase her risk significantly if we get her off blood thinner.  We will see what the genetic test shows for the breast cancer  I do want to see her back in about 3 months. I'll see that we have do any scans on her.   Volanda Napoleon, MD 8/3/201711:35 AM

## 2015-12-18 LAB — D-DIMER, QUANTITATIVE: D-DIMER: 0.9 mg/L FEU — ABNORMAL HIGH (ref 0.00–0.49)

## 2015-12-20 DIAGNOSIS — G4733 Obstructive sleep apnea (adult) (pediatric): Secondary | ICD-10-CM | POA: Diagnosis not present

## 2015-12-24 DIAGNOSIS — C50911 Malignant neoplasm of unspecified site of right female breast: Secondary | ICD-10-CM | POA: Diagnosis not present

## 2015-12-25 DIAGNOSIS — C50911 Malignant neoplasm of unspecified site of right female breast: Secondary | ICD-10-CM | POA: Diagnosis not present

## 2015-12-29 ENCOUNTER — Other Ambulatory Visit: Payer: Self-pay | Admitting: *Deleted

## 2015-12-29 ENCOUNTER — Encounter: Payer: Self-pay | Admitting: *Deleted

## 2015-12-29 NOTE — Patient Outreach (Signed)
[place] [facility]) Care Management   12/29/2015  Patricia Morgan [birth year] [id number]  Romana Deaton is an 65 y.o. female  Subjective:  HF: Pt reports she continues to do well with no reported issues. States she weighs daily and continues to document all readings. Denies any swelling or weight gained accordingly to the HF zones since last home visit. Pt states she has changes her eating habits to more healthy food items and continues to lower her sodium levels.  MEDICAL APPOINTMENTS: Pt continues to attend all medical appointments. Pending Dr. [name] as her new primary next month. MEDICATIONS: Pt reports no medications changes and she continues to take all prescribed medications as ordered.  Pt reports she is managing her HF with no issues and feels comfort with the knowledge obtained over the months.   Objective:   Review of Systems  All other systems reviewed and are negative.   Physical Exam  Constitutional: She is oriented to person, place, and time. She appears well-developed and well-nourished.  HENT:  Right Ear: External ear normal.  Left Ear: External ear normal.  Eyes: EOM are normal.  Neck: Normal range of motion.  Cardiovascular: Normal heart sounds.   Respiratory: Effort normal and breath sounds normal.  GI: Soft. Bowel sounds are normal.  Musculoskeletal: Normal range of motion.  Neurological: She is alert and oriented to person, place, and time.  Skin: Skin is warm and dry.  Psychiatric: She has a normal mood and affect. Her behavior is normal. Judgment and thought content normal.    Encounter Medications:   Outpatient Encounter Prescriptions as of 12/29/2015  Medication Sig  . acetaminophen (TYLENOL) 500 MG tablet Take 500 mg by mouth every 6 (six) hours as needed for mild pain.  [name] allopurinol (ZYLOPRIM) 100 MG tablet Take 100 mg by mouth 2 (two) times daily.   [name] apixaban (ELIQUIS) 5 MG TABS tablet Take 1 tablet (5 mg total) by mouth 2 (two) times  daily.  . carvedilol (COREG) 25 MG tablet Take 1 tablet (25 mg total) by mouth 2 (two) times daily with a meal.  . colchicine 0.6 MG tablet Take 0.6 mg by mouth daily.   . furosemide (LASIX) 40 MG tablet Take 1 tablet daily, take 1 extra tablet as needed for wt increase of 3 lbs in 1 day or 5 lbs in a week  . hydrALAZINE (APRESOLINE) 10 MG tablet TAKE 1 TABLET(10 MG) BY MOUTH THREE TIMES DAILY  . Insulin Glargine (LANTUS SOLOSTAR) 100 UNIT/ML Solostar Pen Inject 15 Units into the skin 2 (two) times daily. Per sliding scale.  . insulin lispro (HUMALOG KWIKPEN) 100 UNIT/ML KiwkPen Inject 10 Units into the skin 3 (three) times daily.  . isosorbide mononitrate (IMDUR) 30 MG 24 hr tablet TAKE 1 TABLET(30 MG) BY MOUTH DAILY  . rosuvastatin (CRESTOR) 10 MG tablet Take 10 mg by mouth daily.   No facility-administered encounter medications on file as of 12/29/2015.     Functional Status:   In your present state of health, do you have any difficulty performing the following activities: 11/05/2015 11/01/2015  Hearing? N N  Vision? N N  Difficulty concentrating or making decisions? N N  Walking or climbing stairs? N N  Dressing or bathing? N N  Doing errands, shopping? N N  Preparing Food and eating ? N -  Using the Toilet? N -  In the past six months, have you accidently leaked urine? N -  Do you have problems with loss of bowel control?  N -  Managing your Medications? N -  Managing your Finances? N -  Housekeeping or managing your Housekeeping? N -  Some recent data might be hidden   BP 116/60 (BP Location: Left Arm, Patient Position: Sitting, Cuff Size: Normal)   Pulse 73   Resp 20   Ht 1.549 m (_0 )   Wt 212 lb (96.2 kg)   SpO2 98%   BMI 40.06 kg/m   Fall/Depression Screening:    PHQ 2/9 Scores 11/05/2015 09/17/2013 04/04/2013 01/08/2013 09/13/2012 07/16/2012 05/30/2012  PHQ - 2 Score 0 0 0 0 0 0 0    Assessment:  Case management relate to HF Follow up on daily weights related to  HF Follow up on medical appointment   Plan:  Will completed physical assessment and verify no acute issues have occurred. Will verify pt remains in the GREEN zone with no precipating symptoms. Will verify no swelling or issues related to HF pt able to manage her care with no encountered problems. Will verify pt is aware if she gains 3 lbs overnight or 5 lbs within the next week with any symptoms to contact her provider.  Will verify pt continues to weight daily and document all readings. Will continue to encourage adherence with her daily weights and verify pt is aware of what to do if acute symptoms should occur. Will verify pt continues to attend all medical appointments with no delays and sufficient transportation. Plan of care discussed with all goals met today as pt feels comfortable managing her ongoing HF based upon the education provided. Pt will be discharged and primary will be notified today.  [name], RN Care Management Coordinator [place] Office [phone]

## 2016-01-06 DIAGNOSIS — E119 Type 2 diabetes mellitus without complications: Secondary | ICD-10-CM | POA: Diagnosis not present

## 2016-01-06 DIAGNOSIS — I10 Essential (primary) hypertension: Secondary | ICD-10-CM | POA: Diagnosis not present

## 2016-01-06 DIAGNOSIS — M109 Gout, unspecified: Secondary | ICD-10-CM | POA: Diagnosis not present

## 2016-01-06 DIAGNOSIS — N183 Chronic kidney disease, stage 3 (moderate): Secondary | ICD-10-CM | POA: Diagnosis not present

## 2016-01-08 ENCOUNTER — Telehealth: Payer: Self-pay | Admitting: Cardiovascular Disease

## 2016-01-08 NOTE — Telephone Encounter (Signed)
Received records from [facility] for appointment on 01/11/16 with Dr Croitoru.  Records given to N Hines (medical records) for Dr Croitoru's schedule on 01/11/16. lp °

## 2016-01-11 ENCOUNTER — Ambulatory Visit (INDEPENDENT_AMBULATORY_CARE_PROVIDER_SITE_OTHER): Payer: Commercial Managed Care - HMO | Admitting: Cardiovascular Disease

## 2016-01-11 ENCOUNTER — Encounter (INDEPENDENT_AMBULATORY_CARE_PROVIDER_SITE_OTHER): Payer: Self-pay

## 2016-01-11 ENCOUNTER — Encounter: Payer: Self-pay | Admitting: Cardiovascular Disease

## 2016-01-11 VITALS — BP 118/70 | Ht 61.0 in | Wt 214.8 lb

## 2016-01-11 DIAGNOSIS — N184 Chronic kidney disease, stage 4 (severe): Secondary | ICD-10-CM

## 2016-01-11 DIAGNOSIS — I5042 Chronic combined systolic (congestive) and diastolic (congestive) heart failure: Secondary | ICD-10-CM

## 2016-01-11 DIAGNOSIS — E669 Obesity, unspecified: Secondary | ICD-10-CM

## 2016-01-11 DIAGNOSIS — I10 Essential (primary) hypertension: Secondary | ICD-10-CM | POA: Diagnosis not present

## 2016-01-11 DIAGNOSIS — D6851 Activated protein C resistance: Secondary | ICD-10-CM

## 2016-01-11 DIAGNOSIS — G4733 Obstructive sleep apnea (adult) (pediatric): Secondary | ICD-10-CM

## 2016-01-11 NOTE — Progress Notes (Signed)
Cardiology Office Note    Date:  01/12/2016   ID:  Patricia Morgan, DOB [birth year], MRN [id number]  PCP:  Gara Kroner, MD  Cardiologist:   Sanda Klein, MD   Chief Complaint  Patient presents with  . Follow-up    pt c/o SOB on exertion    History of Present Illness:  Patricia Morgan is a 65 y.o. female recently hospitalized for acute exacerbation of chronic systolic heart failure due to nonischemic cardiomyopathy, superimposed on obstructive sleep apnea, diabetes mellitus, hyperlipidemia, chronic kidney disease stage III, recurrent venous thromboembolic events (factor V Leiden) and long-standing systemic hypertension. It is suspected previous chemotherapy with Adriamycin mass contributing to her left ventricular dysfunction.  Coronary angiography performed June 2017 showed no evidence of coronary artery disease but confirmed elevated filling pressure. PA pressure was 57/18 in the setting of a mean pulmonary wedge pressure of 33 mmHg. Echo showed ejection fraction of 45%. Nuclear scintigraphy calculated ejection fraction of 38%. It also suggested the presence of perfusion abnormalities, false positive by coronary angiography.  She feels much better than at her previous evaluation. She denies any exertional dyspnea, orthopnea, PND, leg edema, chest discomfort, palpitations, syncope or dizziness. She denies focal neurological events and has not had any bleeding problems. She has not had any gout episodes. She reports compliance with CPAP.  Just before her cardiac catheterization on May 17 she weighed 222 pounds. After diuresis on July 7 she weighed 218 pounds and today she is down to 214 pounds (her home scale shows 210 pounds).  She is currently on treatment with maximum dose of carvedilol and a relatively low dose of furosemide. She is not on RAAS inhibitors due to moderately advanced kidney disease. Diabetes control is excellent with a hemoglobin A1c of 6.0%.   She just saw Dr.  Moshe Cipro on August 23. Her creatinine had increased to about 2.7 on August 3 (may be some component of nephrotoxicity or hypotension), but her most recent creatinine was 2.27 which is probably at her baseline) on August 23  Her new primary care physician is Dr. Antony Contras. Dr. Elsworth Soho manages her sleep apnea treatment and Dr. Moshe Cipro is her nephrologist.  Past Medical History:  Diagnosis Date  . ANEMIA-NOS   . BREAST CANCER, HX OF 1995   Right Breast  . CHRONIC KIDNEY DISEASE STAGE III (MODERATE)   . DIABETES MELLITUS, TYPE II   . HYPERLIPIDEMIA   . HYPERTENSION   . OBSTRUCTIVE SLEEP APNEA    CPAP  . Proteinuria   . PULMONARY EMBOLISM 09/24/2008   anticoag thru 03/2010    Past Surgical History:  Procedure Laterality Date  . CARDIAC CATHETERIZATION N/A 11/02/2015   Procedure: Right/Left Heart Cath and Coronary Angiography;  Surgeon: Burnell Blanks, MD;  Location: [facility];  Service: Cardiovascular;  Laterality: N/A;  . insertion port a cath    . MASTECTOMY  1995   Right  . PORT-A-CATH REMOVAL      Current Medications: Outpatient Medications Prior to Visit  Medication Sig Dispense Refill  . acetaminophen (TYLENOL) 500 MG tablet Take 500 mg by mouth every 6 (six) hours as needed for mild pain.    [name] allopurinol (ZYLOPRIM) 100 MG tablet Take 100 mg by mouth 2 (two) times daily.     [name] apixaban (ELIQUIS) 5 MG TABS tablet Take 1 tablet (5 mg total) by mouth 2 (two) times daily. 60 tablet 6  . carvedilol (COREG) 25 MG tablet Take 1 tablet (25 mg total) by  mouth 2 (two) times daily with a meal. 60 tablet 6  . colchicine 0.6 MG tablet Take 0.6 mg by mouth daily.   1  . furosemide (LASIX) 40 MG tablet Take 1 tablet daily, take 1 extra tablet as needed for wt increase of 3 lbs in 1 day or 5 lbs in a week 60 tablet 3  . hydrALAZINE (APRESOLINE) 10 MG tablet TAKE 1 TABLET(10 MG) BY MOUTH THREE TIMES DAILY 270 tablet 3  . Insulin Glargine (LANTUS SOLOSTAR) 100 UNIT/ML  Solostar Pen Inject 15 Units into the skin as needed. Per sliding scale.     . insulin lispro (HUMALOG KWIKPEN) 100 UNIT/ML KiwkPen Inject 10 Units into the skin as needed.     . rosuvastatin (CRESTOR) 10 MG tablet Take 10 mg by mouth daily.    . isosorbide mononitrate (IMDUR) 30 MG 24 hr tablet TAKE 1 TABLET(30 MG) BY MOUTH DAILY 90 tablet 3   No facility-administered medications prior to visit.      Allergies:   Review of patient's allergies indicates no known allergies.   Social History   Social History  . Marital status: Divorced    Spouse name: N/A  . Number of children: N/A  . Years of education: 65   Social History Main Topics  . Smoking status: Never Smoker  . Smokeless tobacco: Never Used     Comment: Lives alone-divorced  . Alcohol use No  . Drug use: No  . Sexual activity: Not Asked   Other Topics Concern  . None   Social History Narrative  . None     Family History:  The patient's family history includes Diabetes in her sister. Her sister had long QT syndrome, but no documented ventricular arrhythmia  ROS:   Please see the history of present illness.    ROS All other systems reviewed and are negative.   PHYSICAL EXAM:   VS:  BP 118/70 (BP Location: Left Arm, Patient Position: Sitting, Cuff Size: Normal)   Ht 5\' 1"  (1.549 m)   Wt 214 lb 12.8 oz (97.4 kg)   SpO2 94%   BMI 40.59 kg/m    GEN: Well nourished, well developed, in no acute distress  HEENT: normal  Neck: no JVD, carotid bruits, or masses Cardiac: Mild lateral displacement of the apical impulse, RRR; no murmurs, rubs, or gallops,no edema  Respiratory:  clear to auscultation bilaterally, normal work of breathing GI: soft, nontender, nondistended, + BS MS: no deformity or atrophy  Skin: warm and dry, no rash Neuro:  Alert and Oriented x 3, Strength and sensation are intact Psych: euthymic mood, full affect  Wt Readings from Last 3 Encounters:  01/11/16 214 lb 12.8 oz (97.4 kg)  12/29/15 212  lb (96.2 kg)  12/17/15 217 lb (98.4 kg)      Studies/Labs Reviewed:   EKG:  EKG is ordered today.  The ekg ordered today demonstrates Sinus rhythm with mild first-degree A-V block (PR 228 ms), left ventricular hypertrophy by voltage only  Recent Labs: 08/30/2015: B Natriuretic Peptide 12.2 12/17/2015: ALT 23; BUN 45.8; Creatinine 2.7; HGB 11.4; Platelets 200; Potassium 5.0; Sodium 141   Lipid Panel    Component Value Date/Time   CHOL 184 09/13/2012 1357   TRIG 142 09/13/2012 1357   HDL 45 09/13/2012 1357   CHOLHDL 4.1 09/13/2012 1357   VLDL 28 09/13/2012 1357   LDLCALC 111 (H) 09/13/2012 1357   LDLDIRECT 141.3 10/14/2008 1004      ASSESSMENT:  1. Chronic combined systolic and diastolic CHF (congestive heart failure) ([place])   2. Factor V Leiden mutation ([place])   3. Benign essential HTN   4. Obstructive sleep apnea   5. CKD (chronic kidney disease), stage IV ([place])   6. Obesity      PLAN:  In order of problems listed above:  1. CHF: The exact cause and severity of her cardiomyopathy is not clear, but hypertension and Adriamycin are likely playing a role. No coronary disease by angiography. Continue carvedilol. On hydralazine/nitrates as vasodilator therapy. I don't think there is room to titrate these any further. Avoid hypertension and worsening of renal function. Avoiding ACE inhibitor/ARB/spironolactone due to renal insufficiency. Right heart catheterization showed severely elevated left heart filling pressures as the major cause of her dyspnea. The trans-pulmonary gradient was not elevated, suggesting that previous pulmonary embolism and obstructive sleep apnea have not played a major role in her pulmonary hypertension 2. Recurrent DVT/PE:  hypercoagulable state due to heterozygous factor V Leiden. Should remain on lifelong anticoagulation. 3. HTN: Well controlled 4. OSA: Reports compliance with CPAP. We'll see if her fatigue improves with better heart failure therapy, if  not may need repeat titration study. 5. CKD: Very mild increase in creatinine after cardiac catheterization and diuresis, now back to baseline 6. Obesity: This is probably playing a role in her tendency to have recurrent DVT as well as the presence of sleep apnea and heart failure. Weight loss is recommended. If successful, we will have to gradually reassess her dry weight, currently estimated to be around today's weight of 210 on her home scale and 214 pounds on our office scale    Medication Adjustments/Labs and Tests Ordered: Current medicines are reviewed at length with the patient today.  Concerns regarding medicines are outlined above.  Medication changes, Labs and Tests ordered today are listed in the Patient Instructions below. Patient Instructions  Dr Sallyanne Kuster recommends that you schedule a follow-up appointment in 3 months.  If you need a refill on your cardiac medications before your next appointment, please call your pharmacy.    Signed, Sanda Klein, MD  01/12/2016 6:10 PM    [facility] [address], [place], [place]  [address] Phone: [phone]; Fax: [phone]

## 2016-01-11 NOTE — Patient Instructions (Signed)
Dr Croitoru recommends that you schedule a follow-up appointment in 3 months.  If you need a refill on your cardiac medications before your next appointment, please call your pharmacy. 

## 2016-01-12 DIAGNOSIS — D6851 Activated protein C resistance: Secondary | ICD-10-CM | POA: Insufficient documentation

## 2016-01-20 DIAGNOSIS — G4733 Obstructive sleep apnea (adult) (pediatric): Secondary | ICD-10-CM | POA: Diagnosis not present

## 2016-01-21 DIAGNOSIS — G4733 Obstructive sleep apnea (adult) (pediatric): Secondary | ICD-10-CM | POA: Diagnosis not present

## 2016-02-01 DIAGNOSIS — M109 Gout, unspecified: Secondary | ICD-10-CM | POA: Diagnosis not present

## 2016-02-01 DIAGNOSIS — D6851 Activated protein C resistance: Secondary | ICD-10-CM | POA: Diagnosis not present

## 2016-02-01 DIAGNOSIS — Z23 Encounter for immunization: Secondary | ICD-10-CM | POA: Diagnosis not present

## 2016-02-01 DIAGNOSIS — E1122 Type 2 diabetes mellitus with diabetic chronic kidney disease: Secondary | ICD-10-CM | POA: Diagnosis not present

## 2016-02-01 DIAGNOSIS — G4730 Sleep apnea, unspecified: Secondary | ICD-10-CM | POA: Diagnosis not present

## 2016-02-01 DIAGNOSIS — N183 Chronic kidney disease, stage 3 (moderate): Secondary | ICD-10-CM | POA: Diagnosis not present

## 2016-02-01 DIAGNOSIS — E7800 Pure hypercholesterolemia, unspecified: Secondary | ICD-10-CM | POA: Diagnosis not present

## 2016-02-01 DIAGNOSIS — I272 Other secondary pulmonary hypertension: Secondary | ICD-10-CM | POA: Diagnosis not present

## 2016-02-01 DIAGNOSIS — I428 Other cardiomyopathies: Secondary | ICD-10-CM | POA: Diagnosis not present

## 2016-02-01 DIAGNOSIS — I129 Hypertensive chronic kidney disease with stage 1 through stage 4 chronic kidney disease, or unspecified chronic kidney disease: Secondary | ICD-10-CM | POA: Diagnosis not present

## 2016-02-02 ENCOUNTER — Institutional Professional Consult (permissible substitution): Payer: Commercial Managed Care - HMO | Admitting: Pulmonary Disease

## 2016-02-05 ENCOUNTER — Institutional Professional Consult (permissible substitution): Payer: Commercial Managed Care - HMO | Admitting: Pulmonary Disease

## 2016-02-12 ENCOUNTER — Encounter: Payer: Self-pay | Admitting: Pulmonary Disease

## 2016-02-12 ENCOUNTER — Ambulatory Visit (INDEPENDENT_AMBULATORY_CARE_PROVIDER_SITE_OTHER): Payer: Commercial Managed Care - HMO | Admitting: Pulmonary Disease

## 2016-02-12 DIAGNOSIS — G4733 Obstructive sleep apnea (adult) (pediatric): Secondary | ICD-10-CM | POA: Diagnosis not present

## 2016-02-12 NOTE — Assessment & Plan Note (Signed)
Check CPAP download-we will make adjustments if required  We will take over prescriptions for CPAP supplies  Weight loss encouraged, compliance with goal of at least 4-6 hrs every night is the expectation. Advised against medications with sedative side effects Cautioned against driving when sleepy - understanding that sleepiness will vary on a day to day basis

## 2016-02-12 NOTE — Progress Notes (Signed)
Subjective:    Patient ID: Patricia Morgan, female    DOB: 01-09-1951, 65 y.o.   MRN: [id number]  HPI  Chief Complaint  Patient presents with  . Advice Only    Former [facility] patient, doing well on CPAP machine, did not have download today.  Wearing CPAP every night approx 7 hours nightly.  ES: 46    65 year old woman presentss to reestablish for management of OSA. She last saw my partner clance in 2013. Baseline polysomnogram in 2006 showed moderate OSA with AHI of 16/hour. She was placed on auto CPAP and titrated to 12 cm with good control of events. She had persistent sleepiness, MSL T did not demonstrate any other cause of somnolence-she does not remember using Nuvigil, but anyways this does not seem to be a problem now. She reports good compliance with her nasal CPAP. She was provided with a new machine by her PCP in 06/2015 and this seems to be working well. She is now retired and reports frequent daytime naps. Bedtime is between 11 PM and midnight, sleep latency is minimal, she sleeps on her side with 2 pillows and denies nocturnal awakenings she gets out of bed by 10 AM to take her medications and sometimes stays in bed until 11 AM. She denies dryness of mouth or headaches. She denies excessive use of caffeinated beverages. She's gained about 10 pounds in the last few years.   She also has nonischemic cardiomyopathy and recurrent VTe with her last episode of DVT documented in 08/2015. She was noted to be subtherapeutic and Coumadin is now maintained on apixaban.      Significant tests/ events  NPSG 2006:  AHI 16/hr Auto titration 2012:  Optimal pressure 12cm  MSLT 11/2011 >> SOREM x 1  Past Medical History:  Diagnosis Date  . ANEMIA-NOS   . BREAST CANCER, HX OF 1995   Right Breast  . CHRONIC KIDNEY DISEASE STAGE III (MODERATE)   . DIABETES MELLITUS, TYPE II   . HYPERLIPIDEMIA   . HYPERTENSION   . OBSTRUCTIVE SLEEP APNEA    CPAP  . Proteinuria   . PULMONARY EMBOLISM  09/24/2008   anticoag thru 03/2010    Past Surgical History:  Procedure Laterality Date  . CARDIAC CATHETERIZATION N/A 11/02/2015   Procedure: Right/Left Heart Cath and Coronary Angiography;  Surgeon: Burnell Blanks, MD;  Location: [facility];  Service: Cardiovascular;  Laterality: N/A;  . insertion port a cath    . MASTECTOMY  1995   Right  . PORT-A-CATH REMOVAL       No Known Allergies  Social History   Social History  . Marital status: Divorced    Spouse name: N/A  . Number of children: N/A  . Years of education: 76   Occupational History  . Not on file.   Social History Main Topics  . Smoking status: Never Smoker  . Smokeless tobacco: Never Used     Comment: Lives alone-divorced  . Alcohol use No  . Drug use: No  . Sexual activity: Not on file   Other Topics Concern  . Not on file   Social History Narrative  . No narrative on file     Family History  Problem Relation Age of Onset  . Diabetes Sister     Review of Systems neg for any significant sore throat, dysphagia, itching, sneezing, nasal congestion or excess/ purulent secretions, fever, chills, sweats, unintended wt loss, pleuritic or exertional cp, hempoptysis, orthopnea pnd or change in  chronic leg swelling.   Also denies presyncope, palpitations, heartburn, abdominal pain, nausea, vomiting, diarrhea or change in bowel or urinary habits, dysuria,hematuria, rash, arthralgias, visual complaints, headache, numbness weakness or ataxia.     Objective:   Physical Exam   Gen. Pleasant, obese, in no distress ENT - no lesions, no post nasal drip Neck: No JVD, no thyromegaly, no carotid bruits Lungs: no use of accessory muscles, no dullness to percussion, decreased without rales or rhonchi  Cardiovascular: Rhythm regular, heart sounds  normal, no murmurs or gallops, no peripheral edema Musculoskeletal: No deformities, no cyanosis or clubbing , no tremors        Assessment & Plan:

## 2016-02-12 NOTE — Progress Notes (Signed)
   Subjective:    Patient ID: Patricia Morgan, female    DOB: Aug 30, 1950, 65 y.o.   MRN: [id number]  HPI    Review of Systems  Constitutional: Negative for chills, fever and unexpected weight change.  HENT: Negative for congestion, dental problem, ear pain, nosebleeds, postnasal drip, rhinorrhea, sinus pressure, sneezing, sore throat, trouble swallowing and voice change.   Eyes: Negative for visual disturbance.  Respiratory: Negative for cough, choking and shortness of breath.   Cardiovascular: Negative for chest pain and leg swelling.  Gastrointestinal: Negative for abdominal pain, diarrhea and vomiting.  Genitourinary: Negative for difficulty urinating.  Musculoskeletal: Negative for arthralgias.  Skin: Negative for rash.  Neurological: Negative for tremors, syncope and headaches.  Hematological: Does not bruise/bleed easily.       Objective:   Physical Exam        Assessment & Plan:

## 2016-02-12 NOTE — Patient Instructions (Signed)
Check CPAP download-we will make adjustments if required  We will take over prescriptions for CPAP supplies

## 2016-02-12 NOTE — Assessment & Plan Note (Signed)
Lifelong anticoagulation

## 2016-02-15 ENCOUNTER — Telehealth: Payer: Self-pay | Admitting: Pulmonary Disease

## 2016-02-15 DIAGNOSIS — G4733 Obstructive sleep apnea (adult) (pediatric): Secondary | ICD-10-CM

## 2016-02-15 NOTE — Telephone Encounter (Signed)
Per Dr. Elsworth Soho:  Compliance report shows leakage, needs to change pressure setting to 11cm.  Order entered for change.  Attempted to contact patient, line rang busy, will call back.

## 2016-02-16 NOTE — Telephone Encounter (Signed)
Patient notified of results. Nothing further needed.  

## 2016-02-19 DIAGNOSIS — G4733 Obstructive sleep apnea (adult) (pediatric): Secondary | ICD-10-CM | POA: Diagnosis not present

## 2016-02-24 ENCOUNTER — Encounter: Payer: Self-pay | Admitting: Pulmonary Disease

## 2016-03-18 ENCOUNTER — Other Ambulatory Visit (HOSPITAL_BASED_OUTPATIENT_CLINIC_OR_DEPARTMENT_OTHER): Payer: Commercial Managed Care - HMO

## 2016-03-18 ENCOUNTER — Ambulatory Visit (HOSPITAL_BASED_OUTPATIENT_CLINIC_OR_DEPARTMENT_OTHER): Payer: Commercial Managed Care - HMO | Admitting: Hematology & Oncology

## 2016-03-18 VITALS — BP 137/51 | HR 78 | Temp 98.1°F | Resp 20 | Wt 219.4 lb

## 2016-03-18 DIAGNOSIS — N183 Chronic kidney disease, stage 3 unspecified: Secondary | ICD-10-CM

## 2016-03-18 DIAGNOSIS — R221 Localized swelling, mass and lump, neck: Secondary | ICD-10-CM

## 2016-03-18 DIAGNOSIS — Z853 Personal history of malignant neoplasm of breast: Secondary | ICD-10-CM

## 2016-03-18 DIAGNOSIS — N179 Acute kidney failure, unspecified: Secondary | ICD-10-CM

## 2016-03-18 DIAGNOSIS — E0822 Diabetes mellitus due to underlying condition with diabetic chronic kidney disease: Secondary | ICD-10-CM

## 2016-03-18 DIAGNOSIS — D6851 Activated protein C resistance: Secondary | ICD-10-CM

## 2016-03-18 DIAGNOSIS — I890 Lymphedema, not elsewhere classified: Secondary | ICD-10-CM | POA: Diagnosis not present

## 2016-03-18 DIAGNOSIS — I2699 Other pulmonary embolism without acute cor pulmonale: Secondary | ICD-10-CM

## 2016-03-18 LAB — CBC WITH DIFFERENTIAL (CANCER CENTER ONLY)
BASO#: 0 10*3/uL (ref 0.0–0.2)
BASO%: 0.7 % (ref 0.0–2.0)
EOS%: 2.7 % (ref 0.0–7.0)
Eosinophils Absolute: 0.1 10*3/uL (ref 0.0–0.5)
HEMATOCRIT: 34.1 % — AB (ref 34.8–46.6)
HGB: 11.4 g/dL — ABNORMAL LOW (ref 11.6–15.9)
LYMPH#: 2.1 10*3/uL (ref 0.9–3.3)
LYMPH%: 47.6 % (ref 14.0–48.0)
MCH: 31.6 pg (ref 26.0–34.0)
MCHC: 33.4 g/dL (ref 32.0–36.0)
MCV: 95 fL (ref 81–101)
MONO#: 0.5 10*3/uL (ref 0.1–0.9)
MONO%: 10.3 % (ref 0.0–13.0)
NEUT#: 1.7 10*3/uL (ref 1.5–6.5)
NEUT%: 38.7 % — AB (ref 39.6–80.0)
Platelets: 194 10*3/uL (ref 145–400)
RBC: 3.61 10*6/uL — ABNORMAL LOW (ref 3.70–5.32)
RDW: 14.4 % (ref 11.1–15.7)
WBC: 4.5 10*3/uL (ref 3.9–10.0)

## 2016-03-18 LAB — COMPREHENSIVE METABOLIC PANEL
ALT: 19 U/L (ref 0–55)
ANION GAP: 7 meq/L (ref 3–11)
AST: 22 U/L (ref 5–34)
Albumin: 3.3 g/dL — ABNORMAL LOW (ref 3.5–5.0)
Alkaline Phosphatase: 147 U/L (ref 40–150)
BUN: 29 mg/dL — ABNORMAL HIGH (ref 7.0–26.0)
CALCIUM: 9.3 mg/dL (ref 8.4–10.4)
CHLORIDE: 110 meq/L — AB (ref 98–109)
CO2: 27 mEq/L (ref 22–29)
Creatinine: 2.1 mg/dL — ABNORMAL HIGH (ref 0.6–1.1)
EGFR: 27 mL/min/{1.73_m2} — AB (ref >90)
Glucose: 124 mg/dl (ref 70–140)
POTASSIUM: 4.9 meq/L (ref 3.5–5.1)
Sodium: 143 mEq/L (ref 136–145)
Total Bilirubin: 0.32 mg/dL (ref 0.20–1.20)
Total Protein: 7 g/dL (ref 6.4–8.3)

## 2016-03-18 NOTE — Progress Notes (Signed)
Hematology and Oncology Follow Up Visit  Patricia Morgan [id number] 1951-02-26 65 y.o. 03/18/2016   Principle Diagnosis:   Recurrent Thromboembolic disease  Factor V Leiden - Heterozygote  H/O locally recurrent ductal carcinoma - RIGHT breast  Current Therapy:    Eliquis 5 mg po BID     Interim History:  Patricia Morgan is back for a second office visit. We first saw her back in May. Surprisingly enough, we found out that she is heterozygote for the Factor V Leiden mutation.  We now have her on ELIQUIS. She is really happy about being on ELIQUIS. It gives her a lot more "Freedom" to be oh to do things.   She is worried about some fullness that she is noted up in the left neck. She's noted this for about a month. There's been no change in medications. She's had no swallowing problems. She's had no pain.  She and her family will be going up to [place] for Thanksgiving. She is looking forward to this.  She's had no problems with cough or shortness of breath. She does have cardiac issues. She does have kidney issues. She does see quite a few doctors.  She is worried about some lymphedema in the right arm. She's had this for quite a while. I told her that I think that physical therapy with a lymphedema specialist might help. She is agreeable to being evaluated for her lymphedema.  Currently, [name].    Medications:  Current Outpatient Prescriptions:  .  acetaminophen (TYLENOL) 500 MG tablet, Take 500 mg by mouth every 6 (six) hours as needed for mild pain., Disp: , Rfl:  .  allopurinol (ZYLOPRIM) 100 MG tablet, Take 100 mg by mouth 2 (two) times daily. , Disp: , Rfl:  .  apixaban (ELIQUIS) 5 MG TABS tablet, Take 1 tablet (5 mg total) by mouth 2 (two) times daily., Disp: 60 tablet, Rfl: 6 .  carvedilol (COREG) 25 MG tablet, Take 1 tablet (25 mg total) by mouth 2 (two) times daily with a meal., Disp: 60 tablet, Rfl: 6 .  colchicine 0.6 MG tablet, Take 0.6  mg by mouth daily. , Disp: , Rfl: 1 .  furosemide (LASIX) 40 MG tablet, Take 1 tablet daily, take 1 extra tablet as needed for wt increase of 3 lbs in 1 day or 5 lbs in a week, Disp: 60 tablet, Rfl: 3 .  hydrALAZINE (APRESOLINE) 10 MG tablet, TAKE 1 TABLET(10 MG) BY MOUTH THREE TIMES DAILY, Disp: 270 tablet, Rfl: 3 .  Insulin Glargine (LANTUS SOLOSTAR) 100 UNIT/ML Solostar Pen, Inject 15 Units into the skin as needed. Per sliding scale. , Disp: , Rfl:  .  insulin lispro (HUMALOG KWIKPEN) 100 UNIT/ML KiwkPen, Inject 10 Units into the skin as needed. , Disp: , Rfl:  .  isosorbide mononitrate (IMDUR) 30 MG 24 hr tablet, TAKE 1 TABLET(30 MG) BY MOUTH DAILY, Disp: 90 tablet, Rfl: 3 .  rosuvastatin (CRESTOR) 10 MG tablet, Take 10 mg by mouth daily., Disp: , Rfl:  .  TRUE METRIX BLOOD GLUCOSE TEST test strip, , Disp: , Rfl:   Allergies: No Known Allergies  Past Medical History, Surgical history, Social history, and Family History were reviewed and updated.  Review of Systems: As above  Physical Exam:  weight is 219 lb 6.4 oz (99.5 kg). Her oral temperature is 98.1 F (36.7 C). Her blood pressure is 137/51 (abnormal) and her pulse is 78. Her respiration is 20.   Wt  Readings from Last 3 Encounters:  03/18/16 219 lb 6.4 oz (99.5 kg)  02/12/16 216 lb 3.2 oz (98.1 kg)  01/11/16 214 lb 12.8 oz (97.4 kg)     Head and neck exam shows no ocular or oral lesions. She has no palpable cervical or supraclavicular lymph nodes. Lungs are clear bilaterally. Cardiac exam regular rate and rhythm with no murmurs, rubs or bruits. Abdomen is soft. She has good bowel sounds. There is no fluid wave. There is a palpable liver or spleen tip. Breast exam shows left breast no masses, edema or erythema. There is no left axillary adenopathy. Right chest wall shows well-healed mastectomy. There is no right chest wall masses. There is no right chest wall edema. There is no right axillary adenopathy. Extremities shows some  chronic lymphedema of the right arm. Lower extremities shows chronic mild nonpitting edema of the lower legs. No venous cord is noted. She has negative Homans sign. Skin exam shows no rashes, ecchymoses or petechia. Neurological exam shows no focal neurological deficits.   Lab Results  Component Value Date   WBC 4.5 03/18/2016   HGB 11.4 (L) 03/18/2016   HCT 34.1 (L) 03/18/2016   MCV 95 03/18/2016   PLT 194 03/18/2016     Chemistry      Component Value Date/Time   NA 141 12/17/2015 1035   K 5.0 12/17/2015 1035   CL 109 11/26/2015 1107   CL 105 10/05/2015 1438   CO2 23 12/17/2015 1035   BUN 45.8 (H) 12/17/2015 1035   CREATININE 2.7 (H) 12/17/2015 1035      Component Value Date/Time   CALCIUM 9.8 12/17/2015 1035   ALKPHOS 132 12/17/2015 1035   AST 26 12/17/2015 1035   ALT 23 12/17/2015 1035   BILITOT 0.45 12/17/2015 1035         Impression and Plan: Patricia Morgan is a 65 year old Afro-American female. She has recurrent thromboembolic disease. She now is on ELIQUIS. She is heterozygous for the factor V Leiden mutation. This is quite unusual in the African-American population.  Again, I think given her history of recurrent disease, we probably have to consider lifelong ELIQUIS. She does have other risk factors for thromboembolic disease which I think will increase her risk significantly if we get her off blood thinner.  We will set her up with a ultrasound of her neck. I really cannot feel anything specific with respect to the left supraclavicular region. However, I still want to make sure that we are not to "Cavalier" with our evaluation. Iabout 25 years since she had her right mastectomy.  I we probably get her back now in 4 months. I know that she will have a good time up in [place] for thanks giving.   Volanda Napoleon, MD 11/3/20171:00 PM

## 2016-03-21 DIAGNOSIS — G4733 Obstructive sleep apnea (adult) (pediatric): Secondary | ICD-10-CM | POA: Diagnosis not present

## 2016-03-23 ENCOUNTER — Ambulatory Visit (HOSPITAL_BASED_OUTPATIENT_CLINIC_OR_DEPARTMENT_OTHER)
Admission: RE | Admit: 2016-03-23 | Discharge: 2016-03-23 | Disposition: A | Discharge status: Home / Self Care | Payer: Commercial Managed Care - HMO | Source: Ambulatory Visit | Attending: Hematology & Oncology | Admitting: Hematology & Oncology

## 2016-03-23 DIAGNOSIS — I890 Lymphedema, not elsewhere classified: Secondary | ICD-10-CM | POA: Diagnosis not present

## 2016-03-23 DIAGNOSIS — E042 Nontoxic multinodular goiter: Secondary | ICD-10-CM | POA: Diagnosis not present

## 2016-03-23 DIAGNOSIS — Z853 Personal history of malignant neoplasm of breast: Secondary | ICD-10-CM | POA: Insufficient documentation

## 2016-03-23 DIAGNOSIS — R221 Localized swelling, mass and lump, neck: Secondary | ICD-10-CM | POA: Diagnosis not present

## 2016-03-24 ENCOUNTER — Encounter: Payer: Self-pay | Admitting: Hematology & Oncology

## 2016-03-28 ENCOUNTER — Telehealth: Payer: Self-pay | Admitting: *Deleted

## 2016-03-28 NOTE — Telephone Encounter (Addendum)
Patient is aware of results.   ----- Message from Volanda Napoleon, MD sent at 03/25/2016  7:10 AM EST ----- Call - the sonogram of the neck did not show any cancer!!!  You have small nodules in your thyroid but these are not suspicious for cancer!!  Merry Christmas!!!  pete

## 2016-03-31 ENCOUNTER — Other Ambulatory Visit: Payer: Self-pay | Admitting: Hematology & Oncology

## 2016-03-31 DIAGNOSIS — I2699 Other pulmonary embolism without acute cor pulmonale: Secondary | ICD-10-CM

## 2016-04-20 DIAGNOSIS — G4733 Obstructive sleep apnea (adult) (pediatric): Secondary | ICD-10-CM | POA: Diagnosis not present

## 2016-04-22 ENCOUNTER — Telehealth: Payer: Self-pay | Admitting: Cardiovascular Disease

## 2016-04-22 NOTE — Telephone Encounter (Signed)
Spoke with pt, she developed a dull " little nudge" last night in her left chest, she went to bed and slept fine but the nudge of discomfort continues today. There is no change with movement and she has not had any burping. Her weight is stable, she denies edema and reports she is at her normal state of SOB. She denies orthopnea. She has nonischemic cardiomyopathy, reassurance given to the patient. Advised her to try zantac OTC to see if that will help. She has a follow up appointment on Monday and will call back if symptoms worsen or change. Pt agreed with this plan.

## 2016-04-25 ENCOUNTER — Ambulatory Visit: Payer: Commercial Managed Care - HMO | Admitting: Cardiovascular Disease

## 2016-04-29 ENCOUNTER — Ambulatory Visit (INDEPENDENT_AMBULATORY_CARE_PROVIDER_SITE_OTHER): Payer: Commercial Managed Care - HMO | Admitting: Cardiovascular Disease

## 2016-04-29 ENCOUNTER — Encounter: Payer: Self-pay | Admitting: Cardiovascular Disease

## 2016-04-29 VITALS — BP 128/74 | HR 86 | Ht 61.0 in | Wt 220.2 lb

## 2016-04-29 DIAGNOSIS — D6851 Activated protein C resistance: Secondary | ICD-10-CM

## 2016-04-29 DIAGNOSIS — I5042 Chronic combined systolic (congestive) and diastolic (congestive) heart failure: Secondary | ICD-10-CM | POA: Diagnosis not present

## 2016-04-29 DIAGNOSIS — I110 Hypertensive heart disease with heart failure: Secondary | ICD-10-CM | POA: Diagnosis not present

## 2016-04-29 DIAGNOSIS — E0822 Diabetes mellitus due to underlying condition with diabetic chronic kidney disease: Secondary | ICD-10-CM

## 2016-04-29 DIAGNOSIS — IMO0001 Reserved for inherently not codable concepts without codable children: Secondary | ICD-10-CM

## 2016-04-29 DIAGNOSIS — R0602 Shortness of breath: Secondary | ICD-10-CM

## 2016-04-29 DIAGNOSIS — E6609 Other obesity due to excess calories: Secondary | ICD-10-CM

## 2016-04-29 DIAGNOSIS — Z794 Long term (current) use of insulin: Secondary | ICD-10-CM

## 2016-04-29 DIAGNOSIS — Z6841 Body Mass Index (BMI) 40.0 and over, adult: Secondary | ICD-10-CM

## 2016-04-29 DIAGNOSIS — N184 Chronic kidney disease, stage 4 (severe): Secondary | ICD-10-CM | POA: Diagnosis not present

## 2016-04-29 DIAGNOSIS — E785 Hyperlipidemia, unspecified: Secondary | ICD-10-CM | POA: Diagnosis not present

## 2016-04-29 DIAGNOSIS — N183 Chronic kidney disease, stage 3 (moderate): Secondary | ICD-10-CM

## 2016-04-29 NOTE — Patient Instructions (Signed)
Dr Sallyanne Kuster recommends that you continue on your current medications as directed. Please refer to the Current Medication list given to you today.  Your physician recommends that you return for lab work at your convenience - FASTING.  Dr Sallyanne Kuster recommends that you schedule a follow-up appointment in 6 months. You will receive a reminder letter in the mail two months in advance. If you don't receive a letter, please call our office to schedule the follow-up appointment.  If you need a refill on your cardiac medications before your next appointment, please call your pharmacy.

## 2016-04-29 NOTE — Progress Notes (Signed)
Cardiology Office Note    Date:  04/30/2016   ID:  Patricia Morgan, DOB [birth year], MRN [id number]  PCP:  Gara Kroner, MD  Cardiologist:   Sanda Klein, MD   Chief Complaint  Patient presents with  . Follow-up    History of Present Illness:  Patricia Morgan is a 65 y.o. 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  Coronary angiography performed June 2017 showed no evidence of coronary artery disease but confirmed elevated filling pressure. PA pressure was 57/18 in the setting of a mean pulmonary wedge pressure of 33 mmHg. Echo showed ejection fraction of 45%. Nuclear scintigraphy calculated ejection fraction of 38%. It also suggested the presence of perfusion abnormalities, false positive by coronary angiography.  She feels well, NYHA class I-II. Occasionally has mild puffiness in her right ankle, but does not have significant edema. She denies any exertional dyspnea except with strenuous activity, denies orthopnea, PND, leg edema, chest discomfort, palpitations, syncope or dizziness. She denies focal neurological events and has not had any bleeding problems. She has not had any gout episodes. She reports compliance with CPAP.  Just before her cardiac catheterization on May 17 she weighed 222 pounds. After diuresis on July 7 she weighed 218 pounds and today she is at 220 pounds (her home scale shows 4 pounds less).  Coronary angiography performed June 2017 showed no evidence of coronary artery disease but confirmed elevated filling pressure. PA pressure was 57/18 in the setting of a mean pulmonary wedge pressure of 33 mmHg. Echo showed ejection fraction of 45%. Nuclear scintigraphy calculated ejection fraction of  38%. It also suggested the presence of perfusion abnormalities, false positive by coronary angiography.  She is currently on treatment with maximum dose of carvedilol and a relatively low dose of furosemide. She is not on RAAS inhibitors due to moderately advanced kidney disease. Diabetes control is excellent with a hemoglobin A1c of 5.7% and she has noticed more frequent episodes of hypoglycemia recently.   She last saw Dr. Moshe Cipro on August 23 and is due to see her again in a few weeks. Her creatinine had increased to about 2.7 on August 3 (may be some component of nephrotoxicity or hypotension), but her most recent creatinine was 2.1 which is probably at her baseline) on November 3  Her primary care physician is Dr. Antony Contras. Dr. Elsworth Soho manages her sleep apnea treatment and Dr. Moshe Cipro is her nephrologist.  Past Medical History:  Diagnosis Date  . ANEMIA-NOS   . BREAST CANCER, HX OF 1995   Right Breast  . CHRONIC KIDNEY DISEASE STAGE III (MODERATE)   . DIABETES MELLITUS, TYPE II   . HYPERLIPIDEMIA   . HYPERTENSION   . OBSTRUCTIVE SLEEP APNEA    CPAP  . Proteinuria   . PULMONARY EMBOLISM 09/24/2008   anticoag thru 03/2010    Past Surgical History:  Procedure Laterality Date  . CARDIAC CATHETERIZATION N/A 11/02/2015   Procedure: Right/Left Heart Cath and Coronary Angiography;  Surgeon: Burnell Blanks, MD;  Location: [facility];  Service: Cardiovascular;  Laterality: N/A;  . insertion port a cath    . MASTECTOMY  1995   Right  . PORT-A-CATH REMOVAL      Current Medications: Outpatient Medications Prior to Visit  Medication Sig Dispense Refill  . acetaminophen (TYLENOL) 500 MG tablet Take  500 mg by mouth every 6 (six) hours as needed for mild pain.    [name] allopurinol (ZYLOPRIM) 100 MG tablet Take 100 mg by mouth 2 (two) times daily.     . carvedilol (COREG) 25 MG tablet Take 1 tablet (25 mg total) by mouth 2 (two) times daily with a meal. 60 tablet 6  .  colchicine 0.6 MG tablet Take 0.6 mg by mouth daily.   1  . ELIQUIS 5 MG TABS tablet TAKE 1 TABLET TWICE DAILY 180 tablet 6  . furosemide (LASIX) 40 MG tablet Take 1 tablet daily, take 1 extra tablet as needed for wt increase of 3 lbs in 1 day or 5 lbs in a week 60 tablet 3  . hydrALAZINE (APRESOLINE) 10 MG tablet TAKE 1 TABLET(10 MG) BY MOUTH THREE TIMES DAILY 270 tablet 3  . Insulin Glargine (LANTUS SOLOSTAR) 100 UNIT/ML Solostar Pen Inject 15 Units into the skin as needed. Per sliding scale.     . insulin lispro (HUMALOG KWIKPEN) 100 UNIT/ML KiwkPen Inject 10 Units into the skin as needed.     . isosorbide mononitrate (IMDUR) 30 MG 24 hr tablet TAKE 1 TABLET(30 MG) BY MOUTH DAILY 90 tablet 3  . rosuvastatin (CRESTOR) 10 MG tablet Take 10 mg by mouth daily.    . TRUE METRIX BLOOD GLUCOSE TEST test strip      No facility-administered medications prior to visit.      Allergies:   Patient has no known allergies.   Social History   Social History  . Marital status: Divorced    Spouse name: N/A  . Number of children: N/A  . Years of education: 15   Social History Main Topics  . Smoking status: Never Smoker  . Smokeless tobacco: Never Used     Comment: Lives alone-divorced  . Alcohol use No  . Drug use: No  . Sexual activity: Not Asked   Other Topics Concern  . None   Social History Narrative  . None     Family History:  The patient's family history includes Diabetes in her sister. Her sister had long QT syndrome, but no documented ventricular arrhythmia  ROS:   Please see the history of present illness.    ROS All other systems reviewed and are negative.   PHYSICAL EXAM:   VS:  BP 128/74   Pulse 86   Ht 5\' 1"  (1.549 m)   Wt 220 lb 3.2 oz (99.9 kg)   BMI 41.61 kg/m    GEN: Well nourished, well developed, in no acute distress  HEENT: normal  Neck: no JVD, carotid bruits, or masses Cardiac: Mild lateral displacement of the apical impulse, RRR; no murmurs, rubs, or  gallops,no edema  Respiratory:  clear to auscultation bilaterally, normal work of breathing GI: soft, nontender, nondistended, + BS MS: no deformity or atrophy  Skin: warm and dry, no rash Neuro:  Alert and Oriented x 3, Strength and sensation are intact Psych: euthymic mood, full affect  Wt Readings from Last 3 Encounters:  04/29/16 220 lb 3.2 oz (99.9 kg)  03/18/16 219 lb 6.4 oz (99.5 kg)  02/12/16 216 lb 3.2 oz (98.1 kg)      Studies/Labs Reviewed:   EKG:  EKG is ordered today.  The ekg ordered today demonstrates Sinus rhythm with mild first-degree A-V block, left ventricular hypertrophy With secondary repolarization abnormalities in 1 and aVL  Recent Labs: 08/30/2015: B Natriuretic Peptide 12.2 03/18/2016: ALT 19; BUN 29.0; Creatinine 2.1; HGB 11.4;  Platelets 194; Potassium 4.9; Sodium 143   Lipid Panel    Component Value Date/Time   CHOL 184 09/13/2012 1357   TRIG 142 09/13/2012 1357   HDL 45 09/13/2012 1357   CHOLHDL 4.1 09/13/2012 1357   VLDL 28 09/13/2012 1357   LDLCALC 111 (H) 09/13/2012 1357   LDLDIRECT 141.3 10/14/2008 1004      ASSESSMENT:    1. Chronic combined systolic and diastolic CHF (congestive heart failure) ([place])   2. Factor V Leiden ([place])   3. Hypertensive heart disease with heart failure ([place])   4. CKD (chronic kidney disease), stage IV ([facility])   5. Class 3 obesity due to excess calories with serious comorbidity and body mass index (BMI) of 40.0 to 44.9 in adult ([place])   6. Diabetes mellitus due to underlying condition with stage 3 chronic kidney disease, with long-term current use of insulin ([facility])   7. Dyslipidemia   8. Shortness of breath      PLAN:  In order of problems listed above:  1. CHF: The exact cause and severity of her cardiomyopathy is not clear, but hypertension and Adriamycin are likely playing a role. No coronary disease by angiography. Continue carvedilol, hydralazine/nitrates as vasodilator therapy. Avoid hypotension which could  lead to  worsening of renal function. Avoiding ACE inhibitor/ARB/spironolactone due to renal insufficiency. Right heart catheterization showed severely elevated left heart filling pressures as the major cause of her dyspnea. Today she is within 2 pounds of her weight at cardiac catheterization, a time when she was clearly volume overloaded. She is not particularly short of breath though and I did not increase her diuretics since we have to also manage her renal insufficiency. Check BNP, but note it was only 12.2 in April 2017 (obesity?). 2. Recurrent DVT/PE:  hypercoagulable state due to heterozygous factor V Leiden. Should remain on lifelong anticoagulation. 3. HTN: fair control 4. OSA: Reports compliance with CPAP.  5. CKD: Very mild increase in creatinine after cardiac catheterization and diuresis, now back to baseline. 6. Obesity: This is probably playing a role in her tendency to have recurrent DVT as well as the presence of sleep apnea and heart failure. Weight loss is recommended. We will have to periodically reassess her dry weight.Check BNP today. 7. DM: Improving glycemic control despite absence of weight loss might actually be a sign of worsening renal function.   Medication Adjustments/Labs and Tests Ordered: Current medicines are reviewed at length with the patient today.  Concerns regarding medicines are outlined above.  Medication changes, Labs and Tests ordered today are listed in the Patient Instructions below. Patient Instructions  Dr Sallyanne Kuster recommends that you continue on your current medications as directed. Please refer to the Current Medication list given to you today.  Your physician recommends that you return for lab work at your convenience - FASTING.  Dr Sallyanne Kuster recommends that you schedule a follow-up appointment in 6 months. You will receive a reminder letter in the mail two months in advance. If you don't receive a letter, please call our office to schedule the  follow-up appointment.  If you need a refill on your cardiac medications before your next appointment, please call your pharmacy.    Signed, Sanda Klein, MD  04/30/2016 11:57 AM    [facility] [place], [place], [place]  [address] Phone: [phone]; Fax: [phone]

## 2016-05-03 DIAGNOSIS — M109 Gout, unspecified: Secondary | ICD-10-CM | POA: Diagnosis not present

## 2016-05-03 DIAGNOSIS — Z23 Encounter for immunization: Secondary | ICD-10-CM | POA: Diagnosis not present

## 2016-05-03 DIAGNOSIS — E119 Type 2 diabetes mellitus without complications: Secondary | ICD-10-CM | POA: Diagnosis not present

## 2016-05-03 DIAGNOSIS — N183 Chronic kidney disease, stage 3 (moderate): Secondary | ICD-10-CM | POA: Diagnosis not present

## 2016-05-03 DIAGNOSIS — I10 Essential (primary) hypertension: Secondary | ICD-10-CM | POA: Diagnosis not present

## 2016-05-03 NOTE — Addendum Note (Signed)
Addended by: Zebedee Iba on: 05/03/2016 04:35 PM   Modules accepted: Orders

## 2016-05-21 DIAGNOSIS — G4733 Obstructive sleep apnea (adult) (pediatric): Secondary | ICD-10-CM | POA: Diagnosis not present

## 2016-05-23 DIAGNOSIS — H524 Presbyopia: Secondary | ICD-10-CM | POA: Diagnosis not present

## 2016-05-23 DIAGNOSIS — H2513 Age-related nuclear cataract, bilateral: Secondary | ICD-10-CM | POA: Diagnosis not present

## 2016-05-23 DIAGNOSIS — H52221 Regular astigmatism, right eye: Secondary | ICD-10-CM | POA: Diagnosis not present

## 2016-05-23 DIAGNOSIS — H5202 Hypermetropia, left eye: Secondary | ICD-10-CM | POA: Diagnosis not present

## 2016-05-23 DIAGNOSIS — H35033 Hypertensive retinopathy, bilateral: Secondary | ICD-10-CM | POA: Diagnosis not present

## 2016-05-23 DIAGNOSIS — E119 Type 2 diabetes mellitus without complications: Secondary | ICD-10-CM | POA: Diagnosis not present

## 2016-05-23 DIAGNOSIS — H5201 Hypermetropia, right eye: Secondary | ICD-10-CM | POA: Diagnosis not present

## 2016-05-23 DIAGNOSIS — H35341 Macular cyst, hole, or pseudohole, right eye: Secondary | ICD-10-CM | POA: Diagnosis not present

## 2016-06-06 ENCOUNTER — Other Ambulatory Visit: Payer: Self-pay

## 2016-06-06 MED ORDER — CARVEDILOL 25 MG PO TABS: 25.0000 mg | ORAL_TABLET | Freq: Two times a day (BID) | ORAL | 3 refills | Status: DC

## 2016-06-11 ENCOUNTER — Other Ambulatory Visit: Payer: Self-pay | Admitting: Cardiovascular Disease

## 2016-06-16 ENCOUNTER — Other Ambulatory Visit (HOSPITAL_BASED_OUTPATIENT_CLINIC_OR_DEPARTMENT_OTHER): Payer: Medicare HMO

## 2016-06-16 ENCOUNTER — Encounter: Payer: Self-pay | Admitting: Hematology & Oncology

## 2016-06-16 ENCOUNTER — Ambulatory Visit (HOSPITAL_BASED_OUTPATIENT_CLINIC_OR_DEPARTMENT_OTHER): Payer: Medicare HMO | Admitting: Hematology & Oncology

## 2016-06-16 VITALS — BP 115/62 | HR 75 | Temp 98.5°F | Resp 16 | Ht 61.0 in | Wt 217.0 lb

## 2016-06-16 DIAGNOSIS — Z853 Personal history of malignant neoplasm of breast: Secondary | ICD-10-CM

## 2016-06-16 DIAGNOSIS — I890 Lymphedema, not elsewhere classified: Secondary | ICD-10-CM

## 2016-06-16 DIAGNOSIS — R221 Localized swelling, mass and lump, neck: Secondary | ICD-10-CM

## 2016-06-16 DIAGNOSIS — D6851 Activated protein C resistance: Secondary | ICD-10-CM

## 2016-06-16 DIAGNOSIS — H608X9 Other otitis externa, unspecified ear: Secondary | ICD-10-CM

## 2016-06-16 DIAGNOSIS — I749 Embolism and thrombosis of unspecified artery: Secondary | ICD-10-CM | POA: Diagnosis not present

## 2016-06-16 LAB — CBC WITH DIFFERENTIAL (CANCER CENTER ONLY)
BASO#: 0 10*3/uL (ref 0.0–0.2)
BASO%: 0.3 % (ref 0.0–2.0)
EOS ABS: 0.1 10*3/uL (ref 0.0–0.5)
EOS%: 2.6 % (ref 0.0–7.0)
HEMATOCRIT: 35.8 % (ref 34.8–46.6)
HEMOGLOBIN: 11.9 g/dL (ref 11.6–15.9)
LYMPH#: 1.8 10*3/uL (ref 0.9–3.3)
LYMPH%: 46.3 % (ref 14.0–48.0)
MCH: 31.4 pg (ref 26.0–34.0)
MCHC: 33.2 g/dL (ref 32.0–36.0)
MCV: 95 fL (ref 81–101)
MONO#: 0.3 10*3/uL (ref 0.1–0.9)
MONO%: 8.4 % (ref 0.0–13.0)
NEUT%: 42.4 % (ref 39.6–80.0)
NEUTROS ABS: 1.7 10*3/uL (ref 1.5–6.5)
Platelets: 256 10*3/uL (ref 145–400)
RBC: 3.79 10*6/uL (ref 3.70–5.32)
RDW: 13.8 % (ref 11.1–15.7)
WBC: 3.9 10*3/uL (ref 3.9–10.0)

## 2016-06-16 LAB — CMP (CANCER CENTER ONLY)
ALBUMIN: 3.4 g/dL (ref 3.3–5.5)
ALT(SGPT): 23 U/L (ref 10–47)
AST: 26 U/L (ref 11–38)
Alkaline Phosphatase: 104 U/L — ABNORMAL HIGH (ref 26–84)
BILIRUBIN TOTAL: 0.6 mg/dL (ref 0.20–1.60)
BUN, Bld: 32 mg/dL — ABNORMAL HIGH (ref 7–22)
CALCIUM: 9.2 mg/dL (ref 8.0–10.3)
CO2: 29 meq/L (ref 18–33)
Chloride: 104 mEq/L (ref 98–108)
Creat: 2.5 mg/dl — ABNORMAL HIGH (ref 0.6–1.2)
Glucose, Bld: 148 mg/dL — ABNORMAL HIGH (ref 73–118)
POTASSIUM: 4 meq/L (ref 3.3–4.7)
Sodium: 143 mEq/L (ref 128–145)
Total Protein: 6.9 g/dL (ref 6.4–8.1)

## 2016-06-16 MED ORDER — NEOMYCIN-COLIST-HC-THONZONIUM 3.3-3-10-0.5 MG/ML OT SUSP: 3.0000 [drp] | Freq: Three times a day (TID) | OTIC | 0 refills | Status: DC

## 2016-06-16 NOTE — Progress Notes (Signed)
Hematology and Oncology Follow Up Visit  Patricia Morgan [id number] 1950/10/04 66 y.o. 06/16/2016   Principle Diagnosis:   Recurrent Thromboembolic disease  Factor V Leiden - Heterozygote  H/O locally recurrent ductal carcinoma - RIGHT breast  Current Therapy:    Eliquis 5 mg po BID     Interim History:  Patricia Morgan is back for follow-up. She is doing well. She had a good holiday season. She went to [place] for things giving. She had a good time.  She's had no problems with ELIQUIS. She has had no bleeding. She has had no leg swelling. She has on occasion some tingling in the right leg. She gets a little worried about this.  She does have some lymphedema in the right arm. I will have to see about getting her set up with a lymphedema therapist. She has not seen one yet.   She is complaining of some intermittent pain in the right ear. She wants to see otolaryngology. I will try to take care of this myself.   She does have chronic renal insufficiency. She does see [facility]. Today, her creatinine is 2.5. This actually is holding steady for her. She has no hyperkalemia.   There has been no fever. She did have the "flu" a couple weeks ago. She treated herself.   Currently, [name].    Medications:  Current Outpatient Prescriptions:  .  acetaminophen (TYLENOL) 500 MG tablet, Take 500 mg by mouth every 6 (six) hours as needed for mild pain., Disp: , Rfl:  .  allopurinol (ZYLOPRIM) 100 MG tablet, Take 100 mg by mouth 2 (two) times daily. , Disp: , Rfl:  .  carvedilol (COREG) 25 MG tablet, TAKE 1 TABLET BY MOUTH TWICE DAILY WITH MEALS, Disp: 60 tablet, Rfl: 0 .  colchicine 0.6 MG tablet, Take 0.6 mg by mouth daily. , Disp: , Rfl: 1 .  ELIQUIS 5 MG TABS tablet, TAKE 1 TABLET TWICE DAILY, Disp: 180 tablet, Rfl: 6 .  furosemide (LASIX) 40 MG tablet, Take 1 tablet daily, take 1 extra tablet as needed for wt increase of 3 lbs in 1 day or 5 lbs  in a week, Disp: 60 tablet, Rfl: 3 .  hydrALAZINE (APRESOLINE) 10 MG tablet, TAKE 1 TABLET(10 MG) BY MOUTH THREE TIMES DAILY, Disp: 270 tablet, Rfl: 3 .  Insulin Glargine (LANTUS SOLOSTAR) 100 UNIT/ML Solostar Pen, Inject 15 Units into the skin as needed. Per sliding scale. , Disp: , Rfl:  .  insulin lispro (HUMALOG KWIKPEN) 100 UNIT/ML KiwkPen, Inject 10 Units into the skin as needed. , Disp: , Rfl:  .  isosorbide mononitrate (IMDUR) 30 MG 24 hr tablet, TAKE 1 TABLET(30 MG) BY MOUTH DAILY, Disp: 90 tablet, Rfl: 3 .  neomycin-colistin-hydrocortisone-thonzonium (CORTISPORIN-TC) 3.07-16-08-0.5 MG/ML otic suspension, Place 3 drops into the right ear 3 (three) times daily., Disp: 10 mL, Rfl: 0 .  rosuvastatin (CRESTOR) 10 MG tablet, Take 10 mg by mouth daily., Disp: , Rfl:  .  TRUE METRIX BLOOD GLUCOSE TEST test strip, , Disp: , Rfl:   Allergies: No Known Allergies  Past Medical History, Surgical history, Social history, and Family History were reviewed and updated.  Review of Systems: As above  Physical Exam:  height is 5\' 1"  (1.549 m) and weight is 217 lb (98.4 kg). Her oral temperature is 98.5 F (36.9 C). Her blood pressure is 115/62 and her pulse is 75. Her respiration is 16.   Wt Readings from Last  3 Encounters:  06/16/16 217 lb (98.4 kg)  04/29/16 220 lb 3.2 oz (99.9 kg)  03/18/16 219 lb 6.4 oz (99.5 kg)     Head and neck exam shows no ocular or oral lesions. She has no palpable cervical or supraclavicular lymph nodes. Lungs are clear bilaterally. Cardiac exam regular rate and rhythm with no murmurs, rubs or bruits. Abdomen is soft. She has good bowel sounds. There is no fluid wave. There is a palpable liver or spleen tip. Breast exam shows left breast no masses, edema or erythema. There is no left axillary adenopathy. Right chest wall shows well-healed mastectomy. There is no right chest wall masses. There is no right chest wall edema. There is no right axillary adenopathy. Extremities  shows some chronic lymphedema of the right arm. Lower extremities shows chronic mild nonpitting edema of the lower legs. No venous cord is noted. She has negative Homans sign. Skin exam shows no rashes, ecchymoses or petechia. Neurological exam shows no focal neurological deficits.   Lab Results  Component Value Date   WBC 3.9 06/16/2016   HGB 11.9 06/16/2016   HCT 35.8 06/16/2016   MCV 95 06/16/2016   PLT 256 06/16/2016     Chemistry      Component Value Date/Time   NA 143 06/16/2016 1310   NA 143 03/18/2016 0944   K 4.0 06/16/2016 1310   K 4.9 03/18/2016 0944   CL 104 06/16/2016 1310   CO2 29 06/16/2016 1310   CO2 27 03/18/2016 0944   BUN 32 (H) 06/16/2016 1310   BUN 29.0 (H) 03/18/2016 0944   CREATININE 2.5 (H) 06/16/2016 1310   CREATININE 2.1 (H) 03/18/2016 0944      Component Value Date/Time   CALCIUM 9.2 06/16/2016 1310   CALCIUM 9.3 03/18/2016 0944   ALKPHOS 104 (H) 06/16/2016 1310   ALKPHOS 147 03/18/2016 0944   AST 26 06/16/2016 1310   AST 22 03/18/2016 0944   ALT 23 06/16/2016 1310   ALT 19 03/18/2016 0944   BILITOT 0.60 06/16/2016 1310   BILITOT 0.32 03/18/2016 0944         Impression and Plan: Patricia Morgan is a 66 year old Afro-American female. She has recurrent thromboembolic disease. She now is on ELIQUIS. She is heterozygous for the Factor V Leiden mutation. This is quite unusual in the African-American population.  Again, I think given her history of recurrent disease, we probably have to consider lifelong ELIQUIS. She does have other risk factors for thromboembolic disease which I think will increase her risk significantly if we get her off blood thinner.  I will go ahead and give her some Cortisporin for her right ear. She may have little bit of otitis externa.  We will plan to get her back to see [place] in another 4 months.  We will make sure that she sees physical therapy for some lymphedema suggestions for her right arm.    Volanda Napoleon,  MD 2/1/20182:18 PM

## 2016-06-20 ENCOUNTER — Ambulatory Visit: Payer: Commercial Managed Care - HMO | Admitting: Physical Therapy

## 2016-06-21 ENCOUNTER — Ambulatory Visit: Payer: Commercial Managed Care - HMO | Admitting: Physical Therapy

## 2016-06-21 DIAGNOSIS — G4733 Obstructive sleep apnea (adult) (pediatric): Secondary | ICD-10-CM | POA: Diagnosis not present

## 2016-06-21 NOTE — Therapy (Signed)
[place], [place], [address] Phone: [phone]   Fax:  [phone]  Physical Therapy Evaluation  Patient Details  Name: Patricia Morgan MRN: [id number] Date of Birth: [birth year] No Data Recorded  Encounter Date: 06/21/2016    Past Medical History:  Diagnosis Date  . ANEMIA-NOS   . BREAST CANCER, HX OF 1995   Right Breast  . CHRONIC KIDNEY DISEASE STAGE III (MODERATE)   . DIABETES MELLITUS, TYPE II   . HYPERLIPIDEMIA   . HYPERTENSION   . OBSTRUCTIVE SLEEP APNEA    CPAP  . Proteinuria   . PULMONARY EMBOLISM 09/24/2008   anticoag thru 03/2010    Past Surgical History:  Procedure Laterality Date  . CARDIAC CATHETERIZATION N/A 11/02/2015   Procedure: Right/Left Heart Cath and Coronary Angiography;  Surgeon: Burnell Blanks, MD;  Location: [facility];  Service: Cardiovascular;  Laterality: N/A;  . insertion port a cath    . MASTECTOMY  1995   Right  . PORT-A-CATH REMOVAL      There were no vitals filed for this visit.       Subjective Assessment - 06/21/16 1437    Subjective Had breast cancer 25 years ago and continues to have intermittent pain in the right arm. Has a sleeve that she wears all the time at home, but not when she goes out.     Pertinent History Breast cancer on right 25 years ago; in 1994 had lumpectomy and in 1995 had recurrence with mastectomy.  10 lymph nodes removed, 3 positive. Had chemotherapy. Has been doing fine since then.  HTN, hyperlipidema, diabetes, gout.            [facility] PT Assessment - 06/21/16 0001      Observation/Other Assessments   Other Surveys  --  lymphedema life impact scale score is 11, or 17% impaired                                     Patient will benefit from skilled therapeutic intervention in order to improve the following deficits and impairments:     Visit Diagnosis: Lymphedema, not  elsewhere classified     Problem List Patient Active Problem List   Diagnosis Date Noted  . Factor V Leiden ([place]) 01/12/2016  . Chronic combined systolic and diastolic CHF (congestive heart failure) ([place]) 11/03/2015  . Hypertensive heart disease 11/03/2015  . Non-ischemic cardiomyopathy ([place])   . Abnormal nuclear stress test 11/01/2015  . Chronic renal failure 11/01/2015  . Unstable angina ([place]) 11/01/2015  . Encounter for therapeutic drug monitoring 09/04/2015  . Popliteal DVT (deep venous thrombosis) ([place]) 08/31/2015  . Left ventricular systolic dysfunction, undermined duration 08/31/2015  . SOB (shortness of breath) 08/30/2015  . Obesity 08/30/2015  . Leg pain, left 08/30/2015  . DVT (deep venous thrombosis) ([place]) 09/23/2014  . Pulmonary embolism ([place])   . Otitis, externa 09/17/2013  . Right knee pain 09/17/2013  . Low back pain 01/08/2013  . Unspecified constipation 07/16/2012  . Preventative health care 05/30/2012  . CKD (chronic kidney disease), stage IV ([place]) 06/26/2009  . PROTEINURIA 06/26/2009  . OSA (obstructive sleep apnea) 10/14/2008  . Diabetes mellitus ([place]) 09/29/2008  . Benign essential HTN 09/26/2008  . ANEMIA-NOS 09/26/2008  . BREAST CANCER, HX OF 09/26/2008    Patricia Morgan 06/21/2016, 3:00 PM  [facility] [place]  [place], [place], [address] Phone: [phone]   Fax:  [phone]  Name: Patricia Morgan MRN: [id number] Date of Birth: [birth year]  JUST AS THE EVALUATION WAS BEING STARTED, IT BECAME CLEAR THAT THE PATIENT PREFERS TO RECEIVE TREATMENT CLOSER TO HOME IN [place].  SHE PLANS TO CALL THERE TO SET UP THERAPY.  Patricia Morgan, PT 06/21/16 3:01 PM

## 2016-06-28 DIAGNOSIS — I890 Lymphedema, not elsewhere classified: Secondary | ICD-10-CM | POA: Diagnosis not present

## 2016-06-28 DIAGNOSIS — H608X9 Other otitis externa, unspecified ear: Secondary | ICD-10-CM | POA: Diagnosis not present

## 2016-07-08 ENCOUNTER — Other Ambulatory Visit: Payer: Self-pay | Admitting: Family Medicine

## 2016-07-08 DIAGNOSIS — Z1231 Encounter for screening mammogram for malignant neoplasm of breast: Secondary | ICD-10-CM

## 2016-07-08 DIAGNOSIS — Z9011 Acquired absence of right breast and nipple: Secondary | ICD-10-CM

## 2016-07-19 DIAGNOSIS — I10 Essential (primary) hypertension: Secondary | ICD-10-CM | POA: Diagnosis not present

## 2016-07-19 DIAGNOSIS — Z6838 Body mass index (BMI) 38.0-38.9, adult: Secondary | ICD-10-CM | POA: Diagnosis not present

## 2016-07-19 DIAGNOSIS — M109 Gout, unspecified: Secondary | ICD-10-CM | POA: Diagnosis not present

## 2016-07-19 DIAGNOSIS — G4733 Obstructive sleep apnea (adult) (pediatric): Secondary | ICD-10-CM | POA: Diagnosis not present

## 2016-07-19 DIAGNOSIS — E119 Type 2 diabetes mellitus without complications: Secondary | ICD-10-CM | POA: Diagnosis not present

## 2016-07-19 DIAGNOSIS — N183 Chronic kidney disease, stage 3 (moderate): Secondary | ICD-10-CM | POA: Diagnosis not present

## 2016-07-21 ENCOUNTER — Emergency Department (HOSPITAL_COMMUNITY)
Admission: EM | Admit: 2016-07-21 | Discharge: 2016-07-21 | Disposition: A | Discharge status: Home / Self Care | Payer: Medicare HMO | Attending: Emergency Medicine | Admitting: Emergency Medicine

## 2016-07-21 ENCOUNTER — Emergency Department (HOSPITAL_COMMUNITY): Payer: Medicare HMO

## 2016-07-21 ENCOUNTER — Encounter (HOSPITAL_COMMUNITY): Payer: Self-pay | Admitting: *Deleted

## 2016-07-21 DIAGNOSIS — I130 Hypertensive heart and chronic kidney disease with heart failure and stage 1 through stage 4 chronic kidney disease, or unspecified chronic kidney disease: Secondary | ICD-10-CM | POA: Diagnosis not present

## 2016-07-21 DIAGNOSIS — I509 Heart failure, unspecified: Secondary | ICD-10-CM

## 2016-07-21 DIAGNOSIS — I110 Hypertensive heart disease with heart failure: Secondary | ICD-10-CM | POA: Diagnosis not present

## 2016-07-21 DIAGNOSIS — Z853 Personal history of malignant neoplasm of breast: Secondary | ICD-10-CM | POA: Diagnosis not present

## 2016-07-21 DIAGNOSIS — R0602 Shortness of breath: Secondary | ICD-10-CM

## 2016-07-21 DIAGNOSIS — Z79899 Other long term (current) drug therapy: Secondary | ICD-10-CM | POA: Diagnosis not present

## 2016-07-21 DIAGNOSIS — R0600 Dyspnea, unspecified: Secondary | ICD-10-CM | POA: Diagnosis not present

## 2016-07-21 DIAGNOSIS — I5042 Chronic combined systolic (congestive) and diastolic (congestive) heart failure: Secondary | ICD-10-CM | POA: Diagnosis not present

## 2016-07-21 DIAGNOSIS — Z794 Long term (current) use of insulin: Secondary | ICD-10-CM | POA: Diagnosis not present

## 2016-07-21 DIAGNOSIS — E1122 Type 2 diabetes mellitus with diabetic chronic kidney disease: Secondary | ICD-10-CM | POA: Insufficient documentation

## 2016-07-21 DIAGNOSIS — G4733 Obstructive sleep apnea (adult) (pediatric): Secondary | ICD-10-CM | POA: Diagnosis not present

## 2016-07-21 DIAGNOSIS — N184 Chronic kidney disease, stage 4 (severe): Secondary | ICD-10-CM | POA: Insufficient documentation

## 2016-07-21 LAB — CBC WITH DIFFERENTIAL/PLATELET
Basophils Absolute: 0 10*3/uL (ref 0.0–0.1)
Basophils Relative: 1 %
EOS ABS: 0.1 10*3/uL (ref 0.0–0.7)
Eosinophils Relative: 2 %
HCT: 34.1 % — ABNORMAL LOW (ref 36.0–46.0)
HEMOGLOBIN: 11.1 g/dL — AB (ref 12.0–15.0)
LYMPHS ABS: 2.1 10*3/uL (ref 0.7–4.0)
LYMPHS PCT: 39 %
MCH: 31 pg (ref 26.0–34.0)
MCHC: 32.6 g/dL (ref 30.0–36.0)
MCV: 95.3 fL (ref 78.0–100.0)
MONOS PCT: 8 %
Monocytes Absolute: 0.4 10*3/uL (ref 0.1–1.0)
NEUTROS ABS: 2.7 10*3/uL (ref 1.7–7.7)
Neutrophils Relative %: 50 %
Platelets: 199 10*3/uL (ref 150–400)
RBC: 3.58 MIL/uL — AB (ref 3.87–5.11)
RDW: 15 % (ref 11.5–15.5)
WBC: 5.3 10*3/uL (ref 4.0–10.5)

## 2016-07-21 LAB — I-STAT TROPONIN, ED
TROPONIN I, POC: 0 ng/mL (ref 0.00–0.08)
TROPONIN I, POC: 0 ng/mL (ref 0.00–0.08)

## 2016-07-21 LAB — BRAIN NATRIURETIC PEPTIDE: B Natriuretic Peptide: 512.9 pg/mL — ABNORMAL HIGH (ref 0.0–100.0)

## 2016-07-21 LAB — BASIC METABOLIC PANEL
Anion gap: 6 (ref 5–15)
BUN: 31 mg/dL — AB (ref 6–20)
CHLORIDE: 111 mmol/L (ref 101–111)
CO2: 22 mmol/L (ref 22–32)
Calcium: 9.4 mg/dL (ref 8.9–10.3)
Creatinine, Ser: 2 mg/dL — ABNORMAL HIGH (ref 0.44–1.00)
GFR calc Af Amer: 29 mL/min — ABNORMAL LOW (ref >60)
GFR calc non Af Amer: 25 mL/min — ABNORMAL LOW (ref >60)
Glucose, Bld: 108 mg/dL — ABNORMAL HIGH (ref 65–99)
POTASSIUM: 4 mmol/L (ref 3.5–5.1)
SODIUM: 139 mmol/L (ref 135–145)

## 2016-07-21 MED ORDER — ALBUTEROL SULFATE (2.5 MG/3ML) 0.083% IN NEBU
5.0000 mg | INHALATION_SOLUTION | Freq: Once | RESPIRATORY_TRACT | Status: AC
  Administered 2016-07-21: 5 mg via RESPIRATORY_TRACT
  Filled 2016-07-21: qty 6

## 2016-07-21 MED ORDER — FUROSEMIDE 10 MG/ML IJ SOLN
40.0000 mg | Freq: Once | INTRAMUSCULAR | Status: AC
  Administered 2016-07-21: 40 mg via INTRAVENOUS
  Filled 2016-07-21: qty 4

## 2016-07-21 MED ORDER — TECHNETIUM TO 99M ALBUMIN AGGREGATED
4.0000 | Freq: Once | INTRAVENOUS | Status: AC | PRN
  Administered 2016-07-21: 4 via INTRAVENOUS

## 2016-07-21 MED ORDER — TECHNETIUM TC 99M DIETHYLENETRIAME-PENTAACETIC ACID: 30.0000 | Freq: Once | INTRAVENOUS | Status: DC | PRN

## 2016-07-21 NOTE — ED Notes (Signed)
Spoke with NM regarding scan they are short the medicine for injection she will be going up to scan as soon as possible

## 2016-07-21 NOTE — ED Notes (Signed)
Patient transported to X-ray 

## 2016-07-21 NOTE — Discharge Instructions (Signed)
Please call your cardiologist today or tomorrow to schedule a follow up appointment.  Take an extra dose of your lasix for the next two days.  Return to ER for new or worsening symptoms, any additional concerns.

## 2016-07-21 NOTE — ED Notes (Signed)
Spoke with the NM team regarding pt's NM scan as per staff pt should be picked up within the next hour

## 2016-07-21 NOTE — ED Provider Notes (Signed)
Medical screening examination/treatment/procedure(s) were conducted as a shared visit with non-physician practitioner(s) and myself.  I personally evaluated the patient during the encounter.   EKG Interpretation  Date/Time:  Thursday July 21 2016 06:39:57 EST Ventricular Rate:  82 PR Interval:    QRS Duration: 102 QT Interval:  401 QTC Calculation: 469 R Axis:   11 Text Interpretation:  Sinus rhythm Borderline prolonged PR interval Nonspecific T abnormalities, lateral leads Confirmed by Ashaki Frosch,  DO, Windel Keziah [phone]) on 07/21/2016 6:50:00 AM      Pt is a 66 y.o. female with history of CHF, PE and DVT on a liquid who presents emergency department shortness of breath that started last night and is worse with exertion and lying flat. States she has developed a PE before on Coumadin and she was concerned that this could either be a PE or CHF. Has had some mild left chest pain but is now gone. EKG shows no new ischemic abnormality. Troponin negative. Chest x-ray shows mild pulmonary interstitial edema. She is not hypoxic. We'll give dose of IV Lasix. Plan is to repeat second troponin, obtain VQ scan and if workup is unremarkable other than mild interstitial edema, patient would like discharge home with follow-up with her cardiologist.   Delice Bison Vonnie Spagnolo, DO 07/21/16 [phone]

## 2016-07-21 NOTE — ED Triage Notes (Signed)
C/o sob onset yest states it isn't any worse just not getting better. States yest after walking noticed she was more sob which was different. Denies chest pain slight cough

## 2016-07-21 NOTE — ED Provider Notes (Signed)
Patricia Morgan DEPT Provider Note   CSN: [id number] Arrival date & time: 07/21/16  0516     History   Chief Complaint Chief Complaint  Patient presents with  . Shortness of Breath    HPI Patricia Morgan is a 66 y.o. female.  The history is provided by the patient and medical records. No language interpreter was used.     Patricia Morgan is a 66 y.o. female  with a PMH of CHF, prior PE and DVT on Eliquis who presents to the Emergency Department complaining of worsening shortness of breath which began yesterday while she was walking. Patient states that she typically goes on a walk daily and does not get short of breath, but yesterday she had to cut her walk short due to difficulty breathing. Patient states that she does have some mild shortness of breath at rest, but symptoms are worse with exertion as well as lying flat. She endorses left posterior leg pain yesterday, but that has now resolved. She also endorses a dry cough and intermittent chest pain on the left side which began around midnight and only occurs when she takes a deep breath. He has been compliant with all medications including anticoagulation. She has been wearing her CPAP at night. She denies lower extremity swelling, fevers, chills, abdominal pain, nausea, vomiting, diaphoresis, jaw pain, back pain.  Past Medical History:  Diagnosis Date  . ANEMIA-NOS   . BREAST CANCER, HX OF 1995   Right Breast  . CHRONIC KIDNEY DISEASE STAGE III (MODERATE)   . DIABETES MELLITUS, TYPE II   . HYPERLIPIDEMIA   . HYPERTENSION   . OBSTRUCTIVE SLEEP APNEA    CPAP  . Proteinuria   . PULMONARY EMBOLISM 09/24/2008   anticoag thru 03/2010    Patient Active Problem List   Diagnosis Date Noted  . Factor V Leiden ([place]) 01/12/2016  . Chronic combined systolic and diastolic CHF (congestive heart failure) ([place]) 11/03/2015  . Hypertensive heart disease 11/03/2015  . Non-ischemic cardiomyopathy ([place])   . Abnormal nuclear stress test  11/01/2015  . Chronic renal failure 11/01/2015  . Unstable angina ([place]) 11/01/2015  . Encounter for therapeutic drug monitoring 09/04/2015  . Popliteal DVT (deep venous thrombosis) ([place]) 08/31/2015  . Left ventricular systolic dysfunction, undermined duration 08/31/2015  . SOB (shortness of breath) 08/30/2015  . Obesity 08/30/2015  . Leg pain, left 08/30/2015  . DVT (deep venous thrombosis) ([place]) 09/23/2014  . Pulmonary embolism ([place])   . Otitis, externa 09/17/2013  . Right knee pain 09/17/2013  . Low back pain 01/08/2013  . Unspecified constipation 07/16/2012  . Preventative health care 05/30/2012  . CKD (chronic kidney disease), stage IV ([place]) 06/26/2009  . PROTEINURIA 06/26/2009  . OSA (obstructive sleep apnea) 10/14/2008  . Diabetes mellitus ([place]) 09/29/2008  . Benign essential HTN 09/26/2008  . ANEMIA-NOS 09/26/2008  . BREAST CANCER, HX OF 09/26/2008    Past Surgical History:  Procedure Laterality Date  . CARDIAC CATHETERIZATION N/A 11/02/2015   Procedure: Right/Left Heart Cath and Coronary Angiography;  Surgeon: Burnell Blanks, MD;  Location: [facility];  Service: Cardiovascular;  Laterality: N/A;  . insertion port a cath    . MASTECTOMY  1995   Right  . PORT-A-CATH REMOVAL      OB History    No data available       Home Medications    Prior to Admission medications   Medication Sig Start Date End Date Taking? Authorizing Provider  acetaminophen (TYLENOL) 500 MG tablet  Take 500 mg by mouth every 6 (six) hours as needed for mild pain.   Yes Historical Provider, MD  allopurinol (ZYLOPRIM) 100 MG tablet Take 100 mg by mouth 2 (two) times daily.    Yes Historical Provider, MD  carvedilol (COREG) 25 MG tablet TAKE 1 TABLET BY MOUTH TWICE DAILY WITH MEALS 06/13/16  Yes Mihai Croitoru, MD  colchicine 0.6 MG tablet Take 0.6 mg by mouth daily.  07/13/14  Yes Historical Provider, MD  ELIQUIS 5 MG TABS tablet TAKE 1 TABLET TWICE DAILY 03/31/16  Yes Volanda Napoleon, MD  furosemide (LASIX) 40 MG tablet Take 1 tablet daily, take 1 extra tablet as needed for wt increase of 3 lbs in 1 day or 5 lbs in a week 11/13/15  Yes Almyra Deforest, PA  hydrALAZINE (APRESOLINE) 10 MG tablet TAKE 1 TABLET(10 MG) BY MOUTH THREE TIMES DAILY 11/23/15  Yes Mihai Croitoru, MD  Insulin Glargine (LANTUS SOLOSTAR) 100 UNIT/ML Solostar Pen Inject 15 Units into the skin as needed. Per sliding scale.    Yes Historical Provider, MD  insulin lispro (HUMALOG KWIKPEN) 100 UNIT/ML KiwkPen Inject 10 Units into the skin as needed.    Yes Historical Provider, MD  isosorbide mononitrate (IMDUR) 30 MG 24 hr tablet TAKE 1 TABLET(30 MG) BY MOUTH DAILY 11/23/15  Yes Mihai Croitoru, MD  rosuvastatin (CRESTOR) 10 MG tablet Take 10 mg by mouth daily.   Yes Historical Provider, MD  neomycin-colistin-hydrocortisone-thonzonium (CORTISPORIN-TC) 3.07-16-08-0.5 MG/ML otic suspension Place 3 drops into the right ear 3 (three) times daily. Patient not taking: Reported on 07/21/2016 06/16/16   Volanda Napoleon, MD  TRUE METRIX BLOOD GLUCOSE TEST test strip  02/29/16   Historical Provider, MD    Family History Family History  Problem Relation Age of Onset  . Diabetes Sister     Social History Social History  Substance Use Topics  . Smoking status: Never Smoker  . Smokeless tobacco: Never Used     Comment: Lives alone-divorced  . Alcohol use No     Allergies   Patient has no known allergies.   Review of Systems Review of Systems  Constitutional: Negative for chills and fever.  HENT: Negative for congestion.   Eyes: Negative for visual disturbance.  Respiratory: Positive for cough and shortness of breath.   Cardiovascular: Positive for chest pain. Negative for palpitations and leg swelling.  Gastrointestinal: Negative for abdominal pain, nausea and vomiting.  Genitourinary: Negative for dysuria.  Musculoskeletal: Negative for back pain and neck pain.  Skin: Negative for rash.  Neurological:  Negative for headaches.     Physical Exam Updated Vital Signs BP 155/85 (BP Location: Left Arm)   Pulse 78   Temp 97.5 F (36.4 C) (Oral)   Resp 18   Ht 5' 1.5" (1.562 m)   Wt 98.4 kg   SpO2 100%   BMI 40.34 kg/m   Physical Exam  Constitutional: She is oriented to person, place, and time. She appears well-developed and well-nourished. No distress.  HENT:  Head: Normocephalic and atraumatic.  Cardiovascular: Normal rate, regular rhythm, normal heart sounds and intact distal pulses.   No murmur heard. Pulmonary/Chest: Effort normal and breath sounds normal. No respiratory distress. She has no wheezes. She has no rales. She exhibits no tenderness.  Speaking in full sentences.   Abdominal: Soft. She exhibits no distension. There is no tenderness.  Musculoskeletal: She exhibits edema (1+).  No calf tenderness.   Neurological: She is alert and oriented to person, place,  and time.  Skin: Skin is warm and dry.  Nursing note and vitals reviewed.    ED Treatments / Results  Labs (all labs ordered are listed, but only abnormal results are displayed) Labs Reviewed  CBC WITH DIFFERENTIAL/PLATELET - Abnormal; Notable for the following:       Result Value   RBC 3.58 (*)    Hemoglobin 11.1 (*)    HCT 34.1 (*)    All other components within normal limits  BASIC METABOLIC PANEL - Abnormal; Notable for the following:    Glucose, Bld 108 (*)    BUN 31 (*)    Creatinine, Ser 2.00 (*)    GFR calc non Af Amer 25 (*)    GFR calc Af Amer 29 (*)    All other components within normal limits  BRAIN NATRIURETIC PEPTIDE - Abnormal; Notable for the following:    B Natriuretic Peptide 512.9 (*)    All other components within normal limits  I-STAT TROPOININ, ED  I-STAT TROPOININ, ED    EKG  EKG Interpretation  Date/Time:  Thursday July 21 2016 06:39:57 EST Ventricular Rate:  82 PR Interval:    QRS Duration: 102 QT Interval:  401 QTC Calculation: 469 R Axis:   11 Text  Interpretation:  Sinus rhythm Borderline prolonged PR interval Nonspecific T abnormalities, lateral leads Confirmed by Jaquasia Doscher,  DO, KRISTEN ([id number]) on 07/21/2016 6:50:00 AM       Radiology Dg Chest 2 View  Result Date: 07/21/2016 CLINICAL DATA:  Initial evaluation for acute shortness of breath. EXAM: CHEST  2 VIEW COMPARISON:  Prior radiograph from 08/30/2015. FINDINGS: Mild cardiomegaly, stable. Mediastinal silhouette within normal limits. Aortic atherosclerosis. Lungs normally inflated. Pulmonary vascular congestion with scattered interstitial prominence, suggesting mild pulmonary interstitial edema. No consolidative airspace opacity. No pleural effusion. No pneumothorax. No acute osseous abnormality. IMPRESSION: 1. Cardiomegaly with mild diffuse pulmonary interstitial congestion. 2. No other active cardiopulmonary disease. 3. Aortic atherosclerosis. Electronically Signed   By: Jeannine Boga M.D.   On: 07/21/2016 06:05   Nm Pulmonary Perf And Vent  Result Date: 07/21/2016 CLINICAL DATA:  Dyspnea.  History of pulmonary embolism. EXAM: NUCLEAR MEDICINE VENTILATION - PERFUSION LUNG SCAN TECHNIQUE: Ventilation images were obtained in multiple projections using inhaled aerosol Tc-87m DTPA. Perfusion images were obtained in multiple projections after intravenous injection of Tc-61m MAA. RADIOPHARMACEUTICALS:  32 mCi Technetium-26m DTPA aerosol inhalation and 4.2 mCi Technetium-51m MAA IV COMPARISON:  Chest radiograph from earlier today. 09/23/2008 V/Q scan. FINDINGS: Ventilation: Ventilation defects are seen in the upper lungs bilaterally. Perfusion: There are matched segmental defects in the upper lungs bilaterally with relatively greater perfusion than ventilation in these locations. IMPRESSION: Low probability for pulmonary embolism (10-19%). Electronically Signed   By: Ilona Sorrel M.D.   On: 07/21/2016 14:00    Procedures Procedures (including critical care time)  Medications Ordered in  ED Medications  technetium TC 60M diethylenetriame-pentaacetic acid (DTPA) injection 30 millicurie (not administered)  albuterol (PROVENTIL) (2.5 MG/3ML) 0.083% nebulizer solution 5 mg (5 mg Nebulization Given 07/21/16 0610)  furosemide (LASIX) injection 40 mg (40 mg Intravenous Given 07/21/16 0713)  technetium albumin aggregated (MAA) injection solution 4 millicurie (4 millicuries Intravenous Contrast Given 07/21/16 1346)     Initial Impression / Assessment and Plan / ED Course  I have reviewed the triage vital signs and the nursing notes.  Pertinent labs & imaging results that were available during my care of the patient were reviewed by me and considered in my medical decision making (  see chart for details).    Patricia Morgan is a 66 y.o. female who presents to ED for shortness of breath x 1 day associated with intermittent chest pain. History of PE while on Coumadin and concerned she is having another PE. She is now on Eliquis and has been compliant with medication with no missed doses. On exam, patient is afebrile, hemodynamically stable with 1+ edema. Lungs clear. CXR shows mild pulmonary interstitial edema. EKG non-ischemic. Troponin negative. Kidney function baseline. BNP elevated at 512.9. Dose of IV lasix given. Will obtain VQ scan, 2nd troponin and continue to monitor.   8:50 AM - Patient reevalauted. No complaints at this time. Has used the restroom 4 times since Lasix. No change in symptoms. VQ scan and trop pending. Will continue to monitor.   10:53 AM - 2nd troponin negative. Patient did have negative heart cath in June 2017. Doubt ACS. VQ scan still pending. I spoke with nuclear medicine who states that scan would be early afternoon.   2:23 PM - VQ scan reviewed showing low probability for PE. Patient ambulated in the ED, maintaining sats >90%. Patient states she feels a little better and would like to go home. Return precautions were discussed. Patient agrees to call her cardiologist  and schedule follow up appointment. Will add extra dose of lasix for the next two days and follow up with cards. Patient agreeable with plan and all questions answered.   Patient seen by and discussed with Dr. Leonides Schanz who agrees with treatment plan.    Final Clinical Impressions(s) / ED Diagnoses   Final diagnoses:  Shortness of breath  Acute on chronic congestive heart failure, unspecified congestive heart failure type [facility])    New Prescriptions New Prescriptions   No medications on file     [facility] Katriana Dortch, PA-C 07/21/16 1503

## 2016-07-21 NOTE — ED Notes (Signed)
Ambulated pt to restroom from room, tolerated well. While ambulating to restroom, no complaints but O2 remained at 94%. While ambulating back pt presented SOB and O2 got down to 91%. Nurse was notified.

## 2016-07-21 NOTE — ED Notes (Signed)
Pt reports worsening shortness of breath over the course of the last few days. She states that she feels short of breath upon rest but is worse when walking or moving.

## 2016-07-28 DIAGNOSIS — H35071 Retinal telangiectasis, right eye: Secondary | ICD-10-CM | POA: Diagnosis not present

## 2016-07-28 DIAGNOSIS — H35033 Hypertensive retinopathy, bilateral: Secondary | ICD-10-CM | POA: Diagnosis not present

## 2016-07-28 DIAGNOSIS — E113293 Type 2 diabetes mellitus with mild nonproliferative diabetic retinopathy without macular edema, bilateral: Secondary | ICD-10-CM | POA: Diagnosis not present

## 2016-08-01 ENCOUNTER — Ambulatory Visit
Admission: RE | Admit: 2016-08-01 | Discharge: 2016-08-01 | Disposition: A | Discharge status: Home / Self Care | Payer: Medicare HMO | Source: Ambulatory Visit | Attending: Family Medicine | Admitting: Family Medicine

## 2016-08-01 DIAGNOSIS — Z1231 Encounter for screening mammogram for malignant neoplasm of breast: Secondary | ICD-10-CM

## 2016-08-01 DIAGNOSIS — Z9011 Acquired absence of right breast and nipple: Secondary | ICD-10-CM

## 2016-08-08 DIAGNOSIS — I890 Lymphedema, not elsewhere classified: Secondary | ICD-10-CM | POA: Diagnosis not present

## 2016-08-09 ENCOUNTER — Ambulatory Visit: Payer: Medicare HMO | Admitting: Cardiology

## 2016-08-09 DIAGNOSIS — I890 Lymphedema, not elsewhere classified: Secondary | ICD-10-CM | POA: Diagnosis not present

## 2016-08-11 DIAGNOSIS — I890 Lymphedema, not elsewhere classified: Secondary | ICD-10-CM | POA: Diagnosis not present

## 2016-08-15 DIAGNOSIS — I890 Lymphedema, not elsewhere classified: Secondary | ICD-10-CM | POA: Diagnosis not present

## 2016-08-16 DIAGNOSIS — I129 Hypertensive chronic kidney disease with stage 1 through stage 4 chronic kidney disease, or unspecified chronic kidney disease: Secondary | ICD-10-CM | POA: Diagnosis not present

## 2016-08-16 DIAGNOSIS — E1122 Type 2 diabetes mellitus with diabetic chronic kidney disease: Secondary | ICD-10-CM | POA: Diagnosis not present

## 2016-08-16 DIAGNOSIS — E7800 Pure hypercholesterolemia, unspecified: Secondary | ICD-10-CM | POA: Diagnosis not present

## 2016-08-16 DIAGNOSIS — Z0000 Encounter for general adult medical examination without abnormal findings: Secondary | ICD-10-CM | POA: Diagnosis not present

## 2016-08-16 DIAGNOSIS — S93402A Sprain of unspecified ligament of left ankle, initial encounter: Secondary | ICD-10-CM | POA: Diagnosis not present

## 2016-08-16 DIAGNOSIS — H9201 Otalgia, right ear: Secondary | ICD-10-CM | POA: Diagnosis not present

## 2016-08-16 DIAGNOSIS — I428 Other cardiomyopathies: Secondary | ICD-10-CM | POA: Diagnosis not present

## 2016-08-16 DIAGNOSIS — I272 Pulmonary hypertension, unspecified: Secondary | ICD-10-CM | POA: Diagnosis not present

## 2016-08-16 DIAGNOSIS — N184 Chronic kidney disease, stage 4 (severe): Secondary | ICD-10-CM | POA: Diagnosis not present

## 2016-08-16 DIAGNOSIS — Z23 Encounter for immunization: Secondary | ICD-10-CM | POA: Diagnosis not present

## 2016-08-18 DIAGNOSIS — I890 Lymphedema, not elsewhere classified: Secondary | ICD-10-CM | POA: Diagnosis not present

## 2016-08-19 DIAGNOSIS — G4733 Obstructive sleep apnea (adult) (pediatric): Secondary | ICD-10-CM | POA: Diagnosis not present

## 2016-08-19 DIAGNOSIS — I890 Lymphedema, not elsewhere classified: Secondary | ICD-10-CM | POA: Diagnosis not present

## 2016-08-20 ENCOUNTER — Other Ambulatory Visit: Payer: Self-pay | Admitting: Physician Assistant

## 2016-08-20 DIAGNOSIS — I10 Essential (primary) hypertension: Secondary | ICD-10-CM

## 2016-08-22 NOTE — Telephone Encounter (Signed)
Refill Request.  

## 2016-08-25 DIAGNOSIS — M25572 Pain in left ankle and joints of left foot: Secondary | ICD-10-CM | POA: Diagnosis not present

## 2016-09-01 ENCOUNTER — Other Ambulatory Visit: Payer: Self-pay

## 2016-09-01 DIAGNOSIS — I2699 Other pulmonary embolism without acute cor pulmonale: Secondary | ICD-10-CM

## 2016-09-01 DIAGNOSIS — I10 Essential (primary) hypertension: Secondary | ICD-10-CM

## 2016-09-01 MED ORDER — ROSUVASTATIN CALCIUM 10 MG PO TABS: 10.0000 mg | ORAL_TABLET | Freq: Every day | ORAL | 2 refills | Status: DC

## 2016-09-01 MED ORDER — FUROSEMIDE 40 MG PO TABS: ORAL_TABLET | ORAL | 2 refills | Status: DC

## 2016-09-08 ENCOUNTER — Ambulatory Visit (INDEPENDENT_AMBULATORY_CARE_PROVIDER_SITE_OTHER): Payer: Medicare HMO | Admitting: Cardiovascular Disease

## 2016-09-08 VITALS — BP 108/60 | HR 84 | Ht 62.0 in | Wt 212.0 lb

## 2016-09-08 DIAGNOSIS — N184 Chronic kidney disease, stage 4 (severe): Secondary | ICD-10-CM

## 2016-09-08 DIAGNOSIS — Z794 Long term (current) use of insulin: Secondary | ICD-10-CM | POA: Diagnosis not present

## 2016-09-08 DIAGNOSIS — Z79899 Other long term (current) drug therapy: Secondary | ICD-10-CM | POA: Diagnosis not present

## 2016-09-08 DIAGNOSIS — I10 Essential (primary) hypertension: Secondary | ICD-10-CM

## 2016-09-08 DIAGNOSIS — E0822 Diabetes mellitus due to underlying condition with diabetic chronic kidney disease: Secondary | ICD-10-CM | POA: Diagnosis not present

## 2016-09-08 DIAGNOSIS — I5042 Chronic combined systolic (congestive) and diastolic (congestive) heart failure: Secondary | ICD-10-CM | POA: Diagnosis not present

## 2016-09-08 DIAGNOSIS — E669 Obesity, unspecified: Secondary | ICD-10-CM | POA: Diagnosis not present

## 2016-09-08 DIAGNOSIS — N183 Chronic kidney disease, stage 3 (moderate): Secondary | ICD-10-CM | POA: Diagnosis not present

## 2016-09-08 DIAGNOSIS — G4733 Obstructive sleep apnea (adult) (pediatric): Secondary | ICD-10-CM | POA: Diagnosis not present

## 2016-09-08 DIAGNOSIS — Z853 Personal history of malignant neoplasm of breast: Secondary | ICD-10-CM | POA: Diagnosis not present

## 2016-09-08 DIAGNOSIS — Z6838 Body mass index (BMI) 38.0-38.9, adult: Secondary | ICD-10-CM | POA: Diagnosis not present

## 2016-09-08 DIAGNOSIS — Z87410 Personal history of cervical dysplasia: Secondary | ICD-10-CM | POA: Diagnosis not present

## 2016-09-08 DIAGNOSIS — D6851 Activated protein C resistance: Secondary | ICD-10-CM

## 2016-09-08 DIAGNOSIS — Z9189 Other specified personal risk factors, not elsewhere classified: Secondary | ICD-10-CM | POA: Diagnosis not present

## 2016-09-08 MED ORDER — FUROSEMIDE 40 MG PO TABS: ORAL_TABLET | ORAL | 2 refills | Status: DC

## 2016-09-08 NOTE — Progress Notes (Signed)
Cardiology Office Note    Date:  09/09/2016   ID:  Patricia Morgan, DOB 1951/04/29, MRN [id number]  PCP:  Gara Kroner, MD  Cardiologist:   Sanda Klein, MD   Chief Complaint  Patient presents with  . Follow-up    F/U post hospital    History of Present Illness:  Patricia Morgan is a 66 y.o. 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  She was seen in the emergency room on March 18 for congestive heart failure exacerbation. She had clear-cut orthopnea. She weight 217 pounds on that day. Creatinine was exactly 2.0 when she was in the emergency room for heart failure. The BNP at that time was 512.Today her weight on our scale is 212 pounds, 2 pounds lower on her home scale. She has been trying to lose weight and was exercising daily. She believes she may have lost some real weight. Unfortunately she twisted her ankle about 6 weeks ago and has been less active recently. She saw a retina specialist for a "clot that passed through my eye". He was told that the problem was not serious but requires monitoring. Today appears to have NYHA functional class I-II. She has mild swelling in her left ankle, which she twisted. She denies orthopnea, PND or exertional dyspnea with usual activity. She has not had angina, syncope, dizziness or palpitations and denies any bleeding problems or focal neurological events. She is compliant with CPAP.  Coronary angiography performed June 2017 showed no evidence of coronary artery disease but confirmed elevated filling pressure. PA pressure was 57/18 in the setting of a mean pulmonary wedge pressure of 33 mmHg. Echo showed ejection fraction of 45%. Nuclear scintigraphy calculated ejection fraction  of 38%. It also suggested the presence of perfusion abnormalities, false positive by coronary angiography.mJust before her cardiac catheterization on May 17 she weighed 222 pounds. After diuresis on July 7 she weighed 218 pounds and today she is at 220 pounds (her home scale shows 4 pounds less).  Coronary angiography performed June 2017 showed no evidence of coronary artery disease but confirmed elevated filling pressure. PA pressure was 57/18 in the setting of a mean pulmonary wedge pressure of 33 mmHg. Echo showed ejection fraction of 45%. Nuclear scintigraphy calculated ejection fraction of 38%. It also suggested the presence of perfusion abnormalities, false positive by coronary angiography.  She is currently on treatment with maximum dose of carvedilol and a relatively low dose of furosemide. She is not on RAAS inhibitors due to moderately advanced kidney disease. Diabetes control is excellent with a hemoglobin A1c of 6%.  He sees Dr. Moshe Cipro for chronic kidney disease and her baseline creatinine is around 2.0. Creatinine was exactly 2.0 when she was in the emergency room for heart failure. The BNP at that time was 512. Her primary care physician is Dr. Antony Contras. Dr. Elsworth Soho manages her sleep apnea treatment.  Past Medical History:  Diagnosis Date  . ANEMIA-NOS   . BREAST CANCER, HX OF 1995   Right Breast  . CHRONIC KIDNEY DISEASE STAGE III (MODERATE)   . DIABETES MELLITUS, TYPE II   . HYPERLIPIDEMIA   . HYPERTENSION   . OBSTRUCTIVE SLEEP APNEA    CPAP  . Proteinuria   . PULMONARY EMBOLISM 09/24/2008   anticoag thru 03/2010    Past Surgical History:  Procedure Laterality  Date  . CARDIAC CATHETERIZATION N/A 11/02/2015   Procedure: Right/Left Heart Cath and Coronary Angiography;  Surgeon: Burnell Blanks, MD;  Location: [facility];  Service: Cardiovascular;  Laterality: N/A;  . insertion port a cath    . MASTECTOMY  1995   Right  . PORT-A-CATH REMOVAL       Current Medications: Outpatient Medications Prior to Visit  Medication Sig Dispense Refill  . acetaminophen (TYLENOL) 500 MG tablet Take 500 mg by mouth every 6 (six) hours as needed for mild pain.    [name] allopurinol (ZYLOPRIM) 100 MG tablet Take 100 mg by mouth 2 (two) times daily.     . carvedilol (COREG) 25 MG tablet TAKE 1 TABLET BY MOUTH TWICE DAILY WITH MEALS 60 tablet 0  . colchicine 0.6 MG tablet Take 0.6 mg by mouth daily.   1  . ELIQUIS 5 MG TABS tablet TAKE 1 TABLET TWICE DAILY 180 tablet 6  . hydrALAZINE (APRESOLINE) 10 MG tablet TAKE 1 TABLET(10 MG) BY MOUTH THREE TIMES DAILY 270 tablet 3  . Insulin Glargine (LANTUS SOLOSTAR) 100 UNIT/ML Solostar Pen Inject 15 Units into the skin as needed. Per sliding scale.     . insulin lispro (HUMALOG KWIKPEN) 100 UNIT/ML KiwkPen Inject 10 Units into the skin as needed.     . isosorbide mononitrate (IMDUR) 30 MG 24 hr tablet TAKE 1 TABLET(30 MG) BY MOUTH DAILY 90 tablet 3  . neomycin-colistin-hydrocortisone-thonzonium (CORTISPORIN-TC) 3.07-16-08-0.5 MG/ML otic suspension Place 3 drops into the right ear 3 (three) times daily. 10 mL 0  . rosuvastatin (CRESTOR) 10 MG tablet Take 1 tablet (10 mg total) by mouth daily. 90 tablet 2  . TRUE METRIX BLOOD GLUCOSE TEST test strip     . furosemide (LASIX) 40 MG tablet TAKE 1 TABLET EVERY DAY; TAKE 1 EXTRA TABLET AS NEEDED FOR WEIGHT INCREASE OF 3 POUNDS IN 1 DAY OR 5 POUNDS IN A WEEK 90 tablet 2   No facility-administered medications prior to visit.      Allergies:   Patient has no known allergies.   Social History   Social History  . Marital status: Divorced    Spouse name: N/A  . Number of children: N/A  . Years of education: 33   Social History Main Topics  . Smoking status: Never Smoker  . Smokeless tobacco: Never Used     Comment: Lives alone-divorced  . Alcohol use No  . Drug use: No  . Sexual activity: Not on file   Other Topics Concern  . Not on file   Social History  Narrative  . No narrative on file     Family History:  The patient's family history includes Diabetes in her sister. Her sister had long QT syndrome, but no documented ventricular arrhythmia  ROS:   Please see the history of present illness.    ROS All other systems reviewed and are negative.   PHYSICAL EXAM:   VS:  BP 108/60   Pulse 84   Ht 5\' 2"  (1.575 m)   Wt 96.2 kg (212 lb)   BMI 38.78 kg/m    GEN: Well nourished, well developed, in no acute distress  HEENT: normal  Neck: no JVD, carotid bruits, or masses Cardiac: Mild lateral displacement of the apical impulse, RRR; no murmurs, rubs, or gallops,no edema  Respiratory:  clear to auscultation bilaterally, normal work of breathing GI: soft, nontender, nondistended, + BS MS: no deformity or atrophy  Skin: warm and dry, no rash  Neuro:  Alert and Oriented x 3, Strength and sensation are intact Psych: euthymic mood, full affect  Wt Readings from Last 3 Encounters:  09/08/16 96.2 kg (212 lb)  07/21/16 98.4 kg (217 lb)  06/16/16 98.4 kg (217 lb)      Studies/Labs Reviewed:   EKG:  EKG is not ordered today.  The ekg ordered 07/21/16 demonstrates Sinus rhythm with borderline criteria for LVH and nonspecific repolarization abnormalities  Recent Labs: 06/16/2016: ALT(SGPT) 23 07/21/2016: B Natriuretic Peptide 512.9; BUN 31; Creatinine, Ser 2.00; Hemoglobin 11.1; Platelets 199; Potassium 4.0; Sodium 139   Lipid Panel    Component Value Date/Time   CHOL 184 09/13/2012 1357   TRIG 142 09/13/2012 1357   HDL 45 09/13/2012 1357   CHOLHDL 4.1 09/13/2012 1357   VLDL 28 09/13/2012 1357   LDLCALC 111 (H) 09/13/2012 1357   LDLDIRECT 141.3 10/14/2008 1004      ASSESSMENT:    1. Medication management   2. Essential hypertension   3. Chronic combined systolic and diastolic CHF (congestive heart failure) ([place])   4. Factor V Leiden ([place])   5. OSA (obstructive sleep apnea)   6. CKD (chronic kidney disease), stage IV ([facility])   7.  Class 2 severe obesity due to excess calories with serious comorbidity and body mass index (BMI) of 38.0 to 38.9 in adult ([place])   8. Diabetes mellitus due to underlying condition with stage 3 chronic kidney disease, with long-term current use of insulin ([facility])      PLAN:  In order of problems listed above:  1. CHF: Note recent episode of heart failure exacerbation. She did not immediately recognized the symptoms of exertional dyspnea and orthopnea so we talked a lot about these today. It seems that she has lost true weight and we have to reestablish her "dry weight". We'll start with a target of 207 pounds or less on her home scale (209 pounds or less on our office scale). Discussed sodium restriction and diuretic dose adjustment. Will increase her diuretic dose slightly. 2. Recurrent DVT/PE:  hypercoagulable state due to heterozygous factor V Leiden. Should remain on lifelong anticoagulation. 3. HTN: Excellent control 4. OSA: Reports 100% compliance with CPAP.  5. CKD: Baseline creatinine around 2.0. GFR approximately 25 6. Obesity: It appears that she has lost some real weight due to dietary restrictions and increased physical activity, but additional weight loss is recommended. Her "dry weight" will be a bit of a moving target.  7. DM: Reports good control. Has retinal complications, presumably secondary to diabetes.   Medication Adjustments/Labs and Tests Ordered: Current medicines are reviewed at length with the patient today.  Concerns regarding medicines are outlined above.  Medication changes, Labs and Tests ordered today are listed in the Patient Instructions below. Patient Instructions  Medication Instructions: Dr Sallyanne Kuster has recommended making the following medication changes: 1. INCREASE Furosemide  Take 1 tablet (40 mg total) daily on Tuesdays, Thursdays, Saturdays, and Sundays  Take 1.5 tablets (60 mg total) daily on Mondays, Wednesdays, and Fridays  New TARGET DRY WEIGHT =  207 pounds or less! Please contact the office IN ONE WEEK if your weight does not come down to your target weight!  Labwork: Your physician recommends that you return for lab work in 2 weeks.  Testing/Procedures: NONE ORDERED  Follow-up: Dr Sallyanne Kuster recommends that you schedule a follow-up appointment in 3-4 months.  If you need a refill on your cardiac medications before your next appointment, please call your pharmacy.  Signed, Sanda Klein, MD  09/09/2016 1:37 PM    [facility] [address], [place], [place]  [address] Phone: [phone]; Fax: [phone]

## 2016-09-08 NOTE — Patient Instructions (Signed)
Medication Instructions: Dr Sallyanne Kuster has recommended making the following medication changes: 1. INCREASE Furosemide  Take 1 tablet (40 mg total) daily on Tuesdays, Thursdays, Saturdays, and Sundays  Take 1.5 tablets (60 mg total) daily on Mondays, Wednesdays, and Fridays  New TARGET DRY WEIGHT = 207 pounds or less! Please contact the office IN ONE WEEK if your weight does not come down to your target weight!  Labwork: Your physician recommends that you return for lab work in 2 weeks.  Testing/Procedures: NONE ORDERED  Follow-up: Dr Sallyanne Kuster recommends that you schedule a follow-up appointment in 3-4 months.  If you need a refill on your cardiac medications before your next appointment, please call your pharmacy.

## 2016-09-09 ENCOUNTER — Encounter: Payer: Self-pay | Admitting: Cardiovascular Disease

## 2016-09-12 ENCOUNTER — Encounter: Payer: Self-pay | Admitting: Internal Medicine

## 2016-09-13 DIAGNOSIS — D72819 Decreased white blood cell count, unspecified: Secondary | ICD-10-CM | POA: Diagnosis not present

## 2016-09-18 DIAGNOSIS — G4733 Obstructive sleep apnea (adult) (pediatric): Secondary | ICD-10-CM | POA: Diagnosis not present

## 2016-09-22 ENCOUNTER — Other Ambulatory Visit: Payer: Self-pay | Admitting: Cardiovascular Disease

## 2016-09-22 DIAGNOSIS — Z79899 Other long term (current) drug therapy: Secondary | ICD-10-CM | POA: Diagnosis not present

## 2016-09-23 LAB — BASIC METABOLIC PANEL
BUN / CREAT RATIO: 15 (ref 12–28)
BUN: 34 mg/dL — ABNORMAL HIGH (ref 8–27)
CO2: 24 mmol/L (ref 18–29)
Calcium: 9.5 mg/dL (ref 8.7–10.3)
Chloride: 105 mmol/L (ref 96–106)
Creatinine, Ser: 2.25 mg/dL — ABNORMAL HIGH (ref 0.57–1.00)
GFR calc non Af Amer: 22 mL/min/{1.73_m2} — ABNORMAL LOW (ref >59)
GFR, EST AFRICAN AMERICAN: 25 mL/min/{1.73_m2} — AB (ref >59)
Glucose: 128 mg/dL — ABNORMAL HIGH (ref 65–99)
POTASSIUM: 4.3 mmol/L (ref 3.5–5.2)
SODIUM: 144 mmol/L (ref 134–144)

## 2016-10-02 ENCOUNTER — Other Ambulatory Visit: Payer: Self-pay | Admitting: Hematology & Oncology

## 2016-10-02 DIAGNOSIS — I890 Lymphedema, not elsewhere classified: Secondary | ICD-10-CM

## 2016-10-02 DIAGNOSIS — H608X9 Other otitis externa, unspecified ear: Secondary | ICD-10-CM

## 2016-10-12 DIAGNOSIS — C50911 Malignant neoplasm of unspecified site of right female breast: Secondary | ICD-10-CM | POA: Diagnosis not present

## 2016-10-14 ENCOUNTER — Ambulatory Visit: Payer: Medicare HMO | Admitting: Hematology & Oncology

## 2016-10-14 ENCOUNTER — Other Ambulatory Visit: Payer: Medicare HMO

## 2016-10-17 ENCOUNTER — Ambulatory Visit (INDEPENDENT_AMBULATORY_CARE_PROVIDER_SITE_OTHER): Payer: Medicare HMO | Admitting: Internal Medicine

## 2016-10-17 ENCOUNTER — Encounter: Payer: Self-pay | Admitting: Internal Medicine

## 2016-10-17 VITALS — BP 112/60 | HR 80 | Ht 61.5 in | Wt 209.4 lb

## 2016-10-17 DIAGNOSIS — Z7901 Long term (current) use of anticoagulants: Secondary | ICD-10-CM | POA: Diagnosis not present

## 2016-10-17 DIAGNOSIS — Z86010 Personal history of colonic polyps: Secondary | ICD-10-CM

## 2016-10-17 DIAGNOSIS — C50911 Malignant neoplasm of unspecified site of right female breast: Secondary | ICD-10-CM | POA: Diagnosis not present

## 2016-10-17 MED ORDER — NA SULFATE-K SULFATE-MG SULF 17.5-3.13-1.6 GM/177ML PO SOLN: 1.0000 | Freq: Once | ORAL | 0 refills | Status: AC

## 2016-10-17 NOTE — Progress Notes (Signed)
HISTORY OF PRESENT ILLNESS:  Patricia Morgan is a 66 y.o. female with multiple significant medical problems including breast cancer, renal insufficiency, hypertension, hyperlipidemia, diabetes mellitus (with as needed insulin) obesity, sleep apnea, cardiomyopathy with ejection fraction 38% plus, and hypercoagulable state with a history of pulmonary embolism for which she is on chronic Eliquis therapy under the guidance of Dr. Marin Olp. She presents today at the urging of her primary care provider Dr. [name] regarding surveillance colonoscopy. The patient underwent colonoscopy elsewhere without polyps. Most recent colonoscopy performed here June 2012. In addition to mild diverticulosis she was found to have a 5 mm transverse colon polyp which was removed and found to be a tubular adenoma. Routine follow-up in 5 years recommended. The patient denies any active GI complaints. Her chronic medical problems are stable. She keeps up with her multiple specialists. No interval family history of colon cancer. She tells me that she has come off her blood thinner on multiple occasions when she is needed procedural work.  REVIEW OF SYSTEMS:  All non-GI ROS negative upon comprehensive review of all systems non-GI  Past Medical History:  Diagnosis Date  . ANEMIA-NOS   . BREAST CANCER, HX OF 1995   Right Breast  . CHRONIC KIDNEY DISEASE STAGE III (MODERATE)   . Colon polyps   . DIABETES MELLITUS, TYPE II   . Diverticulosis   . HYPERLIPIDEMIA   . HYPERTENSION   . OBSTRUCTIVE SLEEP APNEA    CPAP  . Proteinuria   . PULMONARY EMBOLISM 09/24/2008   anticoag thru 03/2010    Past Surgical History:  Procedure Laterality Date  . CARDIAC CATHETERIZATION N/A 11/02/2015   Procedure: Right/Left Heart Cath and Coronary Angiography;  Surgeon: Burnell Blanks, MD;  Location: [facility];  Service: Cardiovascular;  Laterality: N/A;  . insertion port a cath    . MASTECTOMY  1995   Right  . PORT-A-CATH  REMOVAL      Social History Patricia Morgan  reports that she has never smoked. She has never used smokeless tobacco. She reports that she does not drink alcohol or use drugs.  family history includes Asthma in her father; Diabetes in her sister; Parkinson's disease in her mother.  No Known Allergies     PHYSICAL EXAMINATION: Vital signs: BP 112/60   Pulse 80   Ht 5' 1.5" (1.562 m)   Wt 209 lb 6 oz (95 kg)   BMI 38.92 kg/m   Constitutional:Pleasant, generally well-appearing, no acute distress Psychiatric: alert and oriented x3, cooperative Eyes: extraocular movements intact, anicteric, conjunctiva pink Mouth: oral pharynx moist, no lesions Neck: supple without thyromegaly Lymph: no lymphadenopathy Cardiovascular: heart regular rate and rhythm, no murmur Lungs: clear to auscultation bilaterally Abdomen: soft,Obese, nontender, nondistended, no obvious ascites, no peritoneal signs, normal bowel sounds, no organomegaly Rectal:Deferred until colonoscopy Extremities: no clubbing cyanosis or lower extremity edema bilaterally Skin: no lesions on visible extremities Neuro: No focal deficits. Cranial nerves intact. No asterixis.    ASSESSMENT:  #1. Personal history of adenomatous colon polyp 2012. Due for surveillance #2. Multiple significant medical problems. Stable. #3. Chronic anticoagulation for history of hypercoagulable state and recurrent pulmonary embolus   PLAN: #1. Surveillance colonoscopy. The patient is high risk given her comorbidities and the need to address anticoagulation and insulin therapies.The nature of the procedure, as well as the risks, benefits, and alternatives were carefully and thoroughly reviewed with the patient. Ample time for discussion and questions allowed. The patient understood, was satisfied, and agreed to proceed. #  2. Advised not to take insulin the day of her procedure #3. Would like to hold  Eliquis for 3 days (renal insufficiency) prior to  her procedure. I would anticipate resumption of her anticoagulant the day of or day following the procedure. We will check with Dr. Marin Olp to see if this is acceptable given her hypercoagulable state

## 2016-10-17 NOTE — Patient Instructions (Signed)

## 2016-10-18 DIAGNOSIS — C50911 Malignant neoplasm of unspecified site of right female breast: Secondary | ICD-10-CM | POA: Diagnosis not present

## 2016-10-20 DIAGNOSIS — Z780 Asymptomatic menopausal state: Secondary | ICD-10-CM | POA: Diagnosis not present

## 2016-10-20 DIAGNOSIS — Z13820 Encounter for screening for osteoporosis: Secondary | ICD-10-CM | POA: Diagnosis not present

## 2016-10-24 ENCOUNTER — Telehealth: Payer: Self-pay

## 2016-10-24 DIAGNOSIS — H60333 Swimmer's ear, bilateral: Secondary | ICD-10-CM | POA: Diagnosis not present

## 2016-10-24 NOTE — Telephone Encounter (Signed)
  10/24/2016   RE: Allyse Fregeau DOB: 1950-09-08 MRN: [id number]   Dear Dr. Marin Olp,   We have scheduled the above patient for an endoscopic procedure. Our records show that she is on anticoagulation therapy.   Please advise as to how long the patient may come off her therapy of Eliquis prior to the procedure, which is scheduled for 11/15/2016.  Please fax back/ or route the completed form to [place] at [phone].   Sincerely,    Phillis Haggis

## 2016-11-02 ENCOUNTER — Telehealth: Payer: Self-pay | Admitting: Hematology & Oncology

## 2016-11-02 MED ORDER — NA SULFATE-K SULFATE-MG SULF 17.5-3.13-1.6 GM/177ML PO SOLN: 1.0000 | Freq: Once | ORAL | 0 refills | Status: AC

## 2016-11-02 NOTE — Telephone Encounter (Signed)
03/2017 03/2017   RE:      Patricia Morgan DOB:   August 23, 1950 MRN:   [id number]   Dear Dr. Marin Olp,   We have scheduled the above patient for an endoscopic procedure. Our records show that she is on anticoagulation therapy.   Please advise as to how long the patient may come off her therapy of Eliquis prior to the procedure, which is scheduled for 11/15/2016.  Please fax back/ or route the completed form to [place] at [phone].   Sincerely,    Phillis Haggis

## 2016-11-02 NOTE — Telephone Encounter (Signed)
Spoke with patient and told her that per Dr. Marin Olp she could hold her Eliquis for 2 days prior to her procedure.  Patient agreed.

## 2016-11-02 NOTE — Telephone Encounter (Signed)
Patient called and resch 10/14/16 missed apt to 11/28/16

## 2016-11-03 DIAGNOSIS — H35071 Retinal telangiectasis, right eye: Secondary | ICD-10-CM | POA: Diagnosis not present

## 2016-11-03 DIAGNOSIS — E113293 Type 2 diabetes mellitus with mild nonproliferative diabetic retinopathy without macular edema, bilateral: Secondary | ICD-10-CM | POA: Diagnosis not present

## 2016-11-03 DIAGNOSIS — H35033 Hypertensive retinopathy, bilateral: Secondary | ICD-10-CM | POA: Diagnosis not present

## 2016-11-15 ENCOUNTER — Encounter: Payer: Self-pay | Admitting: Internal Medicine

## 2016-11-15 ENCOUNTER — Ambulatory Visit (AMBULATORY_SURGERY_CENTER): Payer: Medicare HMO | Admitting: Internal Medicine

## 2016-11-15 VITALS — BP 137/72 | HR 76 | Temp 97.5°F | Resp 18 | Ht 61.5 in | Wt 209.0 lb

## 2016-11-15 DIAGNOSIS — D124 Benign neoplasm of descending colon: Secondary | ICD-10-CM | POA: Diagnosis not present

## 2016-11-15 DIAGNOSIS — Z86010 Personal history of colonic polyps: Secondary | ICD-10-CM

## 2016-11-15 MED ORDER — SODIUM CHLORIDE 0.9 % IV SOLN: 500.0000 mL | INTRAVENOUS | Status: DC

## 2016-11-15 NOTE — Progress Notes (Signed)
Pt to PACU-- AW patent--- VSS---- Report to RN 

## 2016-11-15 NOTE — Patient Instructions (Signed)
RESUME YOUR ELIQUIS TODAY, 11/15/16, AT YOUR PRIOR DOSE.  HANDOUTS GIVEN:POLYPS, DIVERTICULOSIS, HEMORRHOIDS.   YOU HAD AN ENDOSCOPIC PROCEDURE TODAY AT [facility]:   Refer to the procedure report that was given to you for any specific questions about what was found during the examination.  If the procedure report does not answer your questions, please call your gastroenterologist to clarify.  If you requested that your care partner not be given the details of your procedure findings, then the procedure report has been included in a sealed envelope for you to review at your convenience later.  YOU SHOULD EXPECT: Some feelings of bloating in the abdomen. Passage of more gas than usual.  Walking can help get rid of the air that was put into your GI tract during the procedure and reduce the bloating. If you had a lower endoscopy (such as a colonoscopy or flexible sigmoidoscopy) you may notice spotting of blood in your stool or on the toilet paper. If you underwent a bowel prep for your procedure, you may not have a normal bowel movement for a few days.  Please Note:  You might notice some irritation and congestion in your nose or some drainage.  This is from the oxygen used during your procedure.  There is no need for concern and it should clear up in a day or so.  SYMPTOMS TO REPORT IMMEDIATELY:   Following lower endoscopy (colonoscopy or flexible sigmoidoscopy):  Excessive amounts of blood in the stool  Significant tenderness or worsening of abdominal pains  Swelling of the abdomen that is new, acute  Fever of 100F or higher   For urgent or emergent issues, a gastroenterologist can be reached at any hour by calling [phone].   DIET:  We do recommend a small meal at first, but then you may proceed to your regular diet.  Drink plenty of fluids but you should avoid alcoholic beverages for 24 hours.  ACTIVITY:  You should plan to take it easy for the rest of today and you  should NOT DRIVE or use heavy machinery until tomorrow (because of the sedation medicines used during the test).    FOLLOW UP: Our staff will call the number listed on your records the next business day following your procedure to check on you and address any questions or concerns that you may have regarding the information given to you following your procedure. If we do not reach you, we will leave a message.  However, if you are feeling well and you are not experiencing any problems, there is no need to return our call.  We will assume that you have returned to your regular daily activities without incident.  If any biopsies were taken you will be contacted by phone or by letter within the next 1-3 weeks.  Please call us at [phone] if you have not heard about the biopsies in 3 weeks.    SIGNATURES/CONFIDENTIALITY: You and/or your care partner have signed paperwork which will be entered into your electronic medical record.  These signatures attest to the fact that that the information above on your After Visit Summary has been reviewed and is understood.  Full responsibility of the confidentiality of this discharge information lies with you and/or your care-partner.

## 2016-11-15 NOTE — Op Note (Signed)
[facility] Patient Name: Patricia Morgan Procedure Date: 11/15/2016 1:57 PM MRN: [id number] Endoscopist: Docia Chuck. Henrene Pastor , MD Age: 66 Referring MD:  Date of Birth: [birth year] Gender: Female Account #: [id number] Procedure:                Colonoscopy, with cold snare polypectomy x 2 Indications:              High risk colon cancer surveillance: Personal                            history of non-advanced adenoma. Index exam                            elsewhere negative. Last examination here 2012 with                            tubular adenoma Medicines:                Monitored Anesthesia Care Procedure:                Pre-Anesthesia Assessment:                           - Prior to the procedure, a History and Physical                            was performed, and patient medications and                            allergies were reviewed. The patient's tolerance of                            previous anesthesia was also reviewed. The risks                            and benefits of the procedure and the sedation                            options and risks were discussed with the patient.                            All questions were answered, and informed consent                            was obtained. Prior Anticoagulants: The patient has                            taken Eliquis (apixaban), last dose was 2 days                            prior to procedure. ASA Grade Assessment: III - A                            patient with severe systemic disease. After  reviewing the risks and benefits, the patient was                            deemed in satisfactory condition to undergo the                            procedure.                           After obtaining informed consent, the colonoscope                            was passed under direct vision. Throughout the                            procedure, the patient's blood pressure, pulse, and                             oxygen saturations were monitored continuously. The                            Colonoscope was introduced through the anus and                            advanced to the the cecum, identified by                            appendiceal orifice and ileocecal valve. The                            ileocecal valve, appendiceal orifice, and rectum                            were photographed. The quality of the bowel                            preparation was excellent. The colonoscopy was                            performed without difficulty. The patient tolerated                            the procedure well. The bowel preparation used was                            SUPREP. Scope In: 2:09:52 PM Scope Out: 2:24:33 PM Scope Withdrawal Time: 0 hours 10 minutes 38 seconds  Total Procedure Duration: 0 hours 14 minutes 41 seconds  Findings:                 Two polyps were found in the proximal descending                            colon. The polyps were 4 to 5 mm in size. These  polyps were removed with a cold snare. Resection                            and retrieval were complete.                           Multiple diverticula were found in the sigmoid                            colon.                           Internal hemorrhoids were found during retroflexion.                           The exam was otherwise without abnormality on                            direct and retroflexion views. Complications:            No immediate complications. Estimated blood loss:                            None. Estimated Blood Loss:     Estimated blood loss: none. Impression:               - Two 4 to 5 mm polyps in the proximal descending                            colon, removed with a cold snare. Resected and                            retrieved.                           - Diverticulosis in the sigmoid colon.                           - Internal  hemorrhoids.                           - The examination was otherwise normal on direct                            and retroflexion views. Recommendation:           - Repeat colonoscopy in 5 years for surveillance.                           - Resume Eliquis (apixaban) today at prior dose.                           - Patient has a contact number available for                            emergencies. The signs and symptoms of potential  delayed complications were discussed with the                            patient. Return to normal activities tomorrow.                            Written discharge instructions were provided to the                            patient.                           - Resume previous diet.                           - Continue present medications.                           - Await pathology results. Docia Chuck. Henrene Pastor, MD 11/15/2016 2:29:55 PM This report has been signed electronically.

## 2016-11-15 NOTE — Progress Notes (Signed)
Called to room to assist during endoscopic procedure.  Patient ID and intended procedure confirmed with present staff. Received instructions for my participation in the procedure from the performing physician.  

## 2016-11-17 ENCOUNTER — Telehealth: Payer: Self-pay

## 2016-11-17 NOTE — Telephone Encounter (Signed)
  Follow up Call-  Call back number 11/15/2016  Post procedure Call Back phone  # [phone]  Permission to leave phone message Yes  Some recent data might be hidden     Patient questions:  Do you have a fever, pain , or abdominal swelling? No. Pain Score  0 *  Have you tolerated food without any problems? Yes.    Have you been able to return to your normal activities? Yes.    Do you have any questions about your discharge instructions: Diet   No. Medications  No. Follow up visit  No.  Do you have questions or concerns about your Care? No.  Actions: * If pain score is 4 or above: No action needed, pain <4.

## 2016-11-22 ENCOUNTER — Encounter: Payer: Self-pay | Admitting: Internal Medicine

## 2016-11-25 DIAGNOSIS — H60333 Swimmer's ear, bilateral: Secondary | ICD-10-CM | POA: Diagnosis not present

## 2016-11-28 ENCOUNTER — Ambulatory Visit: Payer: Medicare HMO | Admitting: Hematology & Oncology

## 2016-11-28 ENCOUNTER — Other Ambulatory Visit: Payer: Medicare HMO

## 2016-11-28 ENCOUNTER — Other Ambulatory Visit: Payer: Self-pay | Admitting: *Deleted

## 2016-11-28 MED ORDER — ISOSORBIDE MONONITRATE ER 30 MG PO TB24: ORAL_TABLET | ORAL | 3 refills | Status: DC

## 2016-11-28 MED ORDER — HYDRALAZINE HCL 10 MG PO TABS: ORAL_TABLET | ORAL | 3 refills | Status: DC

## 2016-12-01 ENCOUNTER — Ambulatory Visit: Payer: Medicare HMO | Admitting: Hematology & Oncology

## 2016-12-01 ENCOUNTER — Other Ambulatory Visit: Payer: Medicare HMO

## 2016-12-07 ENCOUNTER — Ambulatory Visit (HOSPITAL_BASED_OUTPATIENT_CLINIC_OR_DEPARTMENT_OTHER): Payer: Medicare HMO | Admitting: Hematology & Oncology

## 2016-12-07 ENCOUNTER — Other Ambulatory Visit (HOSPITAL_BASED_OUTPATIENT_CLINIC_OR_DEPARTMENT_OTHER): Payer: Medicare HMO

## 2016-12-07 VITALS — BP 129/72 | HR 72 | Temp 98.4°F | Resp 18 | Wt 204.0 lb

## 2016-12-07 DIAGNOSIS — H608X9 Other otitis externa, unspecified ear: Secondary | ICD-10-CM

## 2016-12-07 DIAGNOSIS — I82401 Acute embolism and thrombosis of unspecified deep veins of right lower extremity: Secondary | ICD-10-CM | POA: Diagnosis not present

## 2016-12-07 DIAGNOSIS — D6851 Activated protein C resistance: Secondary | ICD-10-CM

## 2016-12-07 DIAGNOSIS — I890 Lymphedema, not elsewhere classified: Secondary | ICD-10-CM

## 2016-12-07 LAB — CMP (CANCER CENTER ONLY)
ALBUMIN: 3.7 g/dL (ref 3.3–5.5)
ALT(SGPT): 29 U/L (ref 10–47)
AST: 35 U/L (ref 11–38)
Alkaline Phosphatase: 127 U/L — ABNORMAL HIGH (ref 26–84)
BUN, Bld: 45 mg/dL — ABNORMAL HIGH (ref 7–22)
CALCIUM: 9.9 mg/dL (ref 8.0–10.3)
CHLORIDE: 103 meq/L (ref 98–108)
CO2: 29 meq/L (ref 18–33)
Creat: 2.6 mg/dl — ABNORMAL HIGH (ref 0.6–1.2)
Glucose, Bld: 120 mg/dL — ABNORMAL HIGH (ref 73–118)
Potassium: 4.7 mEq/L (ref 3.3–4.7)
SODIUM: 141 meq/L (ref 128–145)
Total Bilirubin: 0.7 mg/dl (ref 0.20–1.60)
Total Protein: 7.4 g/dL (ref 6.4–8.1)

## 2016-12-07 LAB — CBC WITH DIFFERENTIAL (CANCER CENTER ONLY)
BASO#: 0 10*3/uL (ref 0.0–0.2)
BASO%: 0.5 % (ref 0.0–2.0)
EOS ABS: 0.1 10*3/uL (ref 0.0–0.5)
EOS%: 3.3 % (ref 0.0–7.0)
HCT: 36.6 % (ref 34.8–46.6)
HEMOGLOBIN: 12.1 g/dL (ref 11.6–15.9)
LYMPH#: 1.6 10*3/uL (ref 0.9–3.3)
LYMPH%: 37.6 % (ref 14.0–48.0)
MCH: 31.3 pg (ref 26.0–34.0)
MCHC: 33.1 g/dL (ref 32.0–36.0)
MCV: 95 fL (ref 81–101)
MONO#: 0.4 10*3/uL (ref 0.1–0.9)
MONO%: 8.3 % (ref 0.0–13.0)
NEUT%: 50.3 % (ref 39.6–80.0)
NEUTROS ABS: 2.1 10*3/uL (ref 1.5–6.5)
Platelets: 194 10*3/uL (ref 145–400)
RBC: 3.87 10*6/uL (ref 3.70–5.32)
RDW: 14.5 % (ref 11.1–15.7)
WBC: 4.2 10*3/uL (ref 3.9–10.0)

## 2016-12-07 NOTE — Progress Notes (Signed)
Hematology and Oncology Follow Up Visit  Patricia Morgan [id number] [birth year] 66 y.o. 12/07/2016   Principle Diagnosis:   Recurrent Thromboembolic disease  Factor V Leiden - Heterozygote  H/O locally recurrent ductal carcinoma - RIGHT breast  Current Therapy:    Eliquis 5 mg po BID     Interim History:  Patricia Morgan is back for follow-up. She is doing well. She has had a very good summer so far. She went down to the [place] with him as some of her girlfriends. They all had a great time. They went into the water. They ate some seafood.  She does have insulin dependent diabetes. She is not sure what her last hemoglobin A1c was.  She does have some renal insufficiency. I suspect that this is from her diabetes.  There is no bleeding. She's done well with the ELIQUIS.  Currently, [name].    Medications:  Current Outpatient Prescriptions:  .  acetaminophen (TYLENOL) 500 MG tablet, Take 500 mg by mouth every 6 (six) hours as needed for mild pain., Disp: , Rfl:  .  allopurinol (ZYLOPRIM) 100 MG tablet, Take 100 mg by mouth 2 (two) times daily. , Disp: , Rfl:  .  carvedilol (COREG) 25 MG tablet, TAKE 1 TABLET BY MOUTH TWICE DAILY WITH MEALS, Disp: 60 tablet, Rfl: 0 .  colchicine 0.6 MG tablet, Take 0.6 mg by mouth daily. , Disp: , Rfl: 1 .  ELIQUIS 5 MG TABS tablet, TAKE 1 TABLET TWICE DAILY, Disp: 180 tablet, Rfl: 6 .  furosemide (LASIX) 40 MG tablet, Take 1-1.5 tablets (40-60 mg total) by mouth daily as directed., Disp: 135 tablet, Rfl: 2 .  hydrALAZINE (APRESOLINE) 10 MG tablet, TAKE 1 TABLET(10 MG) BY MOUTH THREE TIMES DAILY, Disp: 270 tablet, Rfl: 3 .  Insulin Glargine (LANTUS SOLOSTAR) 100 UNIT/ML Solostar Pen, Inject 15 Units into the skin as needed. Per sliding scale. , Disp: , Rfl:  .  insulin lispro (HUMALOG KWIKPEN) 100 UNIT/ML KiwkPen, Inject 10 Units into the skin as needed. , Disp: , Rfl:  .  isosorbide mononitrate (IMDUR) 30 MG 24 hr  tablet, TAKE 1 TABLET(30 MG) BY MOUTH DAILY, Disp: 90 tablet, Rfl: 3 .  neomycin-polymyxin-hydrocortisone (CORTISPORIN) 3.5-10000-1 otic suspension, SHAKE LIQUID AND INSTILL 3 DROPS IN RIGHT EAR THREE TIMES DAILY (Patient not taking: Reported on 11/15/2016), Disp: 10 mL, Rfl: 0 .  rosuvastatin (CRESTOR) 10 MG tablet, Take 1 tablet (10 mg total) by mouth daily., Disp: 90 tablet, Rfl: 2 .  TRUE METRIX BLOOD GLUCOSE TEST test strip, , Disp: , Rfl:   Current Facility-Administered Medications:  .  0.9 %  sodium chloride infusion, 500 mL, Intravenous, Continuous, Irene Shipper, MD  Allergies: No Known Allergies  Past Medical History, Surgical history, Social history, and Family History were reviewed and updated.  Review of Systems: As above  Physical Exam:  weight is 204 lb (92.5 kg). Her oral temperature is 98.4 F (36.9 C). Her blood pressure is 129/72 and her pulse is 72. Her respiration is 18 and oxygen saturation is 98%.   Wt Readings from Last 3 Encounters:  12/07/16 204 lb (92.5 kg)  11/15/16 209 lb (94.8 kg)  10/17/16 209 lb 6 oz (95 kg)     Head and neck exam shows no ocular or oral lesions. She has no palpable cervical or supraclavicular lymph nodes. Lungs are clear bilaterally. Cardiac exam regular rate and rhythm with no murmurs, rubs or bruits. Abdomen is soft. She  has good bowel sounds. There is no fluid wave. There is a palpable liver or spleen tip. Breast exam shows left breast no masses, edema or erythema. There is no left axillary adenopathy. Right chest wall shows well-healed mastectomy. There is no right chest wall masses. There is no right chest wall edema. There is no right axillary adenopathy. Extremities shows some chronic lymphedema of the right arm. Lower extremities shows chronic mild nonpitting edema of the lower legs. No venous cord is noted. She has negative Homans sign. Skin exam shows no rashes, ecchymoses or petechia. Neurological exam shows no focal neurological  deficits.   Lab Results  Component Value Date   WBC 4.2 12/07/2016   HGB 12.1 12/07/2016   HCT 36.6 12/07/2016   MCV 95 12/07/2016   PLT 194 12/07/2016     Chemistry      Component Value Date/Time   NA 141 12/07/2016 1300   NA 143 03/18/2016 0944   K 4.7 12/07/2016 1300   K 4.9 03/18/2016 0944   CL 103 12/07/2016 1300   CO2 29 12/07/2016 1300   CO2 27 03/18/2016 0944   BUN 45 (H) 12/07/2016 1300   BUN 29.0 (H) 03/18/2016 0944   CREATININE 2.6 (H) 12/07/2016 1300   CREATININE 2.1 (H) 03/18/2016 0944      Component Value Date/Time   CALCIUM 9.9 12/07/2016 1300   CALCIUM 9.3 03/18/2016 0944   ALKPHOS 127 (H) 12/07/2016 1300   ALKPHOS 147 03/18/2016 0944   AST 35 12/07/2016 1300   AST 22 03/18/2016 0944   ALT 29 12/07/2016 1300   ALT 19 03/18/2016 0944   BILITOT 0.70 12/07/2016 1300   BILITOT 0.32 03/18/2016 0944         Impression and Plan: Patricia Morgan is a 66 year old Afro-American female. She has recurrent thromboembolic disease. She now is on ELIQUIS. She is heterozygous for the Factor V Leiden mutation. This is quite unusual in the African-American population.  Again, I think given her history of recurrent thromboembolism, we probably have to consider lifelong ELIQUIS. She does have other risk factors for thromboembolic disease which I think will increase her risk significantly if we get her off blood thinner.  I may want to consider putting her on maintenance dose of ELIQUIS. I will have to think about this.  I am so glad that she had a great time down in [place]. It sounded like she really enjoyed herself.  We will get her back in 6 more months. I'm sure that her family doctor will keep following her kidney function. I would suspect that the kidney function probably is reflective of her diabetes. The fact that she is losing weight is always a good sign.     Volanda Napoleon, MD 7/25/20181:42 PM

## 2016-12-27 ENCOUNTER — Ambulatory Visit
Admission: RE | Admit: 2016-12-27 | Discharge: 2016-12-27 | Disposition: A | Discharge status: Home / Self Care | Payer: Medicare HMO | Source: Ambulatory Visit | Attending: Family Medicine | Admitting: Family Medicine

## 2016-12-27 ENCOUNTER — Other Ambulatory Visit: Payer: Self-pay | Admitting: Family Medicine

## 2016-12-27 DIAGNOSIS — M25552 Pain in left hip: Secondary | ICD-10-CM

## 2016-12-27 DIAGNOSIS — M85851 Other specified disorders of bone density and structure, right thigh: Secondary | ICD-10-CM | POA: Diagnosis not present

## 2016-12-27 DIAGNOSIS — Z6835 Body mass index (BMI) 35.0-35.9, adult: Secondary | ICD-10-CM | POA: Diagnosis not present

## 2016-12-27 DIAGNOSIS — N184 Chronic kidney disease, stage 4 (severe): Secondary | ICD-10-CM | POA: Diagnosis not present

## 2016-12-27 DIAGNOSIS — M1612 Unilateral primary osteoarthritis, left hip: Secondary | ICD-10-CM | POA: Diagnosis not present

## 2016-12-30 ENCOUNTER — Ambulatory Visit: Payer: Medicare HMO | Admitting: Cardiovascular Disease

## 2017-01-23 ENCOUNTER — Ambulatory Visit (INDEPENDENT_AMBULATORY_CARE_PROVIDER_SITE_OTHER): Payer: Medicare HMO | Admitting: Physician Assistant

## 2017-01-23 ENCOUNTER — Encounter: Payer: Self-pay | Admitting: Physician Assistant

## 2017-01-23 VITALS — BP 142/68 | HR 83 | Ht 62.0 in | Wt 206.8 lb

## 2017-01-23 DIAGNOSIS — Z6837 Body mass index (BMI) 37.0-37.9, adult: Secondary | ICD-10-CM | POA: Diagnosis not present

## 2017-01-23 DIAGNOSIS — I110 Hypertensive heart disease with heart failure: Secondary | ICD-10-CM

## 2017-01-23 DIAGNOSIS — I5022 Chronic systolic (congestive) heart failure: Secondary | ICD-10-CM

## 2017-01-23 DIAGNOSIS — I428 Other cardiomyopathies: Secondary | ICD-10-CM

## 2017-01-23 DIAGNOSIS — Z79899 Other long term (current) drug therapy: Secondary | ICD-10-CM | POA: Diagnosis not present

## 2017-01-23 DIAGNOSIS — N184 Chronic kidney disease, stage 4 (severe): Secondary | ICD-10-CM | POA: Diagnosis not present

## 2017-01-23 LAB — BASIC METABOLIC PANEL
BUN / CREAT RATIO: 17 (ref 12–28)
BUN: 33 mg/dL — ABNORMAL HIGH (ref 8–27)
CHLORIDE: 104 mmol/L (ref 96–106)
CO2: 23 mmol/L (ref 20–29)
Calcium: 9.5 mg/dL (ref 8.7–10.3)
Creatinine, Ser: 1.97 mg/dL — ABNORMAL HIGH (ref 0.57–1.00)
GFR calc non Af Amer: 26 mL/min/{1.73_m2} — ABNORMAL LOW (ref >59)
GFR, EST AFRICAN AMERICAN: 30 mL/min/{1.73_m2} — AB (ref >59)
GLUCOSE: 123 mg/dL — AB (ref 65–99)
Potassium: 4.2 mmol/L (ref 3.5–5.2)
SODIUM: 140 mmol/L (ref 134–144)

## 2017-01-23 NOTE — Progress Notes (Signed)
Thank you, Marthenia Rolling

## 2017-01-23 NOTE — Patient Instructions (Signed)
Medication Instructions:  NO CHANGES Your physician recommends that you continue on your current medications as directed. Please refer to the Current Medication list given to you today. If you need a refill on your cardiac medications before your next appointment, please call your pharmacy.  Labwork: BMET TODAY HERE IN OUR OFFICE AT [facility]  Follow-Up: Your physician wants you to follow-up in: [place]. You should receive a reminder letter in the mail two months in advance. If you do not receive a letter, please call our office JAN 2019 to schedule the MARCH 2019 follow-up appointment.  Thank you for choosing [facility]!!

## 2017-01-23 NOTE — Progress Notes (Signed)
Cardiology Office Note   Date:  01/23/2017   ID:  Patricia Morgan, DOB 11-06-1950, MRN [id number]  PCP:  Antony Contras, MD  Cardiologist:  Dr. Sallyanne Kuster, 08/19/2016  Patricia Morgan, Patricia Marker, PA-C   No chief complaint on file.   History of Present Illness: Patricia Morgan is a 66 y.o. female with a history of S-CHF, NICM (suspected Adriamycin-related), anemia, Factor V Leiden heterozygote w/ recurrent thromboembolic disease on Eliquis, CKD III, DM, HTN, HLD, OSA on CPAP, obesity, Breast CA s/p R mastectomy and chemo (completed in 1995).   09/08/2016 office visit her Lasix was increased from 40 mg daily to 40 mg 4 days a week and 60 mg 3 days a week.  Patricia Morgan presents for cardiology follow up.  She has been compliant with the Eliquis, only missed doses 2nd colonoscopy. No bleeding issues.   Tries to watch what she eats, does ok w/ Na at home, eating out, not so much.   She gets daytime LE edema, does not wake with it. No orthopnea or PND.   She admits that she may not eat all that light.   She is working out 5 days a week, is happy she is not gaining, but a little frustrated that she is not losing.   She denies CP, DOE, feels she is doing generally well.    Past Medical History:  Diagnosis Date  . ANEMIA-NOS   . BREAST CANCER, HX OF 1994 & 1995   1994>>recurrence in 1995, R breast, s/p lumpectomy>>mastectomy  . Cancer ([place])   . CHF (congestive heart failure) ([place])   . CHRONIC KIDNEY DISEASE STAGE III (MODERATE)   . Clotting disorder ([place])    on eliquis for Factor 5 disorder  . Colon polyps   . DIABETES MELLITUS, TYPE II   . Diverticulosis   . HYPERLIPIDEMIA   . HYPERTENSION   . OBSTRUCTIVE SLEEP APNEA    CPAP  . Proteinuria   . PULMONARY EMBOLISM 09/24/2008   anticoag thru 03/2010  . Sleep apnea    Wears CPAP nightly    Past Surgical History:  Procedure Laterality Date  . CARDIAC CATHETERIZATION N/A 11/02/2015   Procedure: Right/Left Heart Cath and Coronary  Angiography;  Surgeon: Burnell Blanks, MD;  Location: [facility];  Service: Cardiovascular;  Laterality: N/A;  . insertion port a cath    . MASTECTOMY  1995   Right  . PORT-A-CATH REMOVAL      Current Outpatient Prescriptions  Medication Sig Dispense Refill  . acetaminophen (TYLENOL) 500 MG tablet Take 500 mg by mouth every 6 (six) hours as needed for mild pain.    [name] allopurinol (ZYLOPRIM) 100 MG tablet Take 100 mg by mouth 2 (two) times daily.     . carvedilol (COREG) 25 MG tablet TAKE 1 TABLET BY MOUTH TWICE DAILY WITH MEALS 60 tablet 0  . colchicine 0.6 MG tablet Take 0.6 mg by mouth daily.   1  . ELIQUIS 5 MG TABS tablet TAKE 1 TABLET TWICE DAILY 180 tablet 6  . furosemide (LASIX) 40 MG tablet Take 1-1.5 tablets (40-60 mg total) by mouth daily as directed. 135 tablet 2  . hydrALAZINE (APRESOLINE) 10 MG tablet TAKE 1 TABLET(10 MG) BY MOUTH THREE TIMES DAILY 270 tablet 3  . Insulin Glargine (LANTUS SOLOSTAR) 100 UNIT/ML Solostar Pen Inject 15 Units into the skin as needed. Per sliding scale.     . insulin lispro (HUMALOG KWIKPEN) 100 UNIT/ML KiwkPen Inject 10 Units into the skin  as needed.     . isosorbide mononitrate (IMDUR) 30 MG 24 hr tablet TAKE 1 TABLET(30 MG) BY MOUTH DAILY 90 tablet 3  . neomycin-polymyxin-hydrocortisone (CORTISPORIN) 3.5-10000-1 otic suspension SHAKE LIQUID AND INSTILL 3 DROPS IN RIGHT EAR THREE TIMES DAILY 10 mL 0  . rosuvastatin (CRESTOR) 10 MG tablet Take 1 tablet (10 mg total) by mouth daily. 90 tablet 2  . TRUE METRIX BLOOD GLUCOSE TEST test strip      Current Facility-Administered Medications  Medication Dose Route Frequency Provider Last Rate Last Dose  . 0.9 %  sodium chloride infusion  500 mL Intravenous Continuous Patricia Shipper, MD        Allergies:   Patient has no known allergies.    Social History:  The patient  reports that she has never smoked. She has never used smokeless tobacco. She reports that she does not drink alcohol or  use drugs.   Family History:  The patient's family history includes Asthma in her father; Diabetes in her sister; Parkinson's disease in her mother.   ROS:  Please see the history of present illness. All other systems are reviewed and negative.    PHYSICAL EXAM: VS:  BP (!) 142/68   Pulse 83   Ht 5\' 2"  (1.575 m)   Wt 206 lb 12.8 oz (93.8 kg)   BMI 37.82 kg/m  , BMI Body mass index is 37.82 kg/m. GEN: Well nourished, well developed, female in no acute distress  HEENT: normal for age  Neck: no JVD, no carotid bruit, no masses Cardiac: RRR; no murmur, no rubs, or gallops Respiratory:  clear to auscultation bilaterally, normal work of breathing GI: soft, nontender, nondistended, + BS MS: no deformity or atrophy; trace LE edema R>L; distal pulses are 2+ in all 4 extremities   Skin: warm and dry, no rash Neuro:  Strength and sensation are intact Psych: euthymic mood, full affect   EKG:  EKG is ordered today. SR, 1st deg AV block, HR 83, borderline LVG, no sig change from 07/21/2016  R/L CATH: 11/02/2015 1. No angiographic evidence of CAD 2. Elevated filling pressures Recommendations: Will not need further ischemic evaluation. She has elevated filling pressures following pre-cath hydration. Would continue Lasix. May need to adjust dose based on renal function. I will hydrate today post cath. Restart heparin drip 8 hours post sheath pull. Will continue heparin tonight. If no groin access site bleeding in am, can restart Eliquis tomorrow. Would plan d/c home tomorrow if stable.   ECHO: 08/31/2015 - Left ventricle: Diffuse hypokinesis possibly worse in inferior wall but   poor endocardial definition consider f/u optison or MRI. The   cavity size was moderately dilated. Wall thickness was normal.   The estimated ejection fraction was 45%. Left ventricular   diastolic function parameters were normal. - Atrial septum: No defect or patent foramen ovale was identified.  Recent  Labs: 07/21/2016: B Natriuretic Peptide 512.9 12/07/2016: ALT(SGPT) 29; BUN, Bld 45; Creat 2.6; HGB 12.1; Platelets 194; Potassium 4.7; Sodium 141    Lipid Panel    Component Value Date/Time   CHOL 184 09/13/2012 1357   TRIG 142 09/13/2012 1357   HDL 45 09/13/2012 1357   CHOLHDL 4.1 09/13/2012 1357   VLDL 28 09/13/2012 1357   LDLCALC 111 (H) 09/13/2012 1357   LDLDIRECT 141.3 10/14/2008 1004     Wt Readings from Last 3 Encounters:  01/23/17 206 lb 12.8 oz (93.8 kg)  12/07/16 204 lb (92.5 kg)  11/15/16 209 lb (  94.8 kg)     Other studies Reviewed: Additional studies/ records that were reviewed today include: office notes, hospital records and testing.  ASSESSMENT AND PLAN:  1.  Chronic systolic CHF: Weight is up minimally, but volume status is good. Continue current Lasix dose, ck BMET today. Emphasized the need to limit sodium in restaurant food, she will try. Continue daily weights  2. HTN: BP is up some today, but she has not taken her morning medications. Continue to follow this as an outpatient, no medication changes for now  3. Obesity: She is exercising regularly, but not losing weight. She admits to calorie indiscretion at times. She is encouraged to look at eating healthier diet in addition to the exercise as well as weight loss would improve her health.  4. Recurrent DVT/PE: She is being followed by Dr. Marin Olp, who recommends lifelong Eliquis.  5. CKD IV: Last labs were in July with BUN 45 and creatinine 2.6. Recheck today as she has been on the higher dose of Lasix since April.   Current medicines are reviewed at length with the patient today.  The patient does not have concerns regarding medicines.  The following changes have been made:  no change  Labs/ tests ordered today include:   Orders Placed This Encounter  Procedures  . Basic metabolic panel  . EKG 12-Lead     Disposition:   FU with Dr. Sallyanne Kuster  Signed, Patricia Ferries, PA-C  01/23/2017 9:25 AM     [facility] Phone: [phone]; Fax: [phone]  This note was written with the assistance of speech recognition software. Please excuse any transcriptional errors.

## 2017-02-09 ENCOUNTER — Encounter: Payer: Self-pay | Admitting: Hematology & Oncology

## 2017-02-09 DIAGNOSIS — M7062 Trochanteric bursitis, left hip: Secondary | ICD-10-CM | POA: Diagnosis not present

## 2017-02-09 NOTE — Progress Notes (Unsigned)
Faxed medical records to: [employer] P: [phone] Req ID: [phone] [place]: 05/17/2015-Present

## 2017-02-14 ENCOUNTER — Telehealth: Payer: Self-pay | Admitting: Cardiovascular Disease

## 2017-02-14 NOTE — Telephone Encounter (Signed)
Left msg to call.

## 2017-02-14 NOTE — Telephone Encounter (Signed)
I agree with your advice MCr

## 2017-02-14 NOTE — Telephone Encounter (Signed)
New Message  Pt c/o Shortness Of Breath: STAT if SOB developed within the last 24 hours or pt is noticeably SOB on the phone  1. Are you currently SOB (can you hear that pt is SOB on the phone)? yes  2. How long have you been experiencing SOB? 9/30  3. Are you SOB when sitting or when up moving around? Moving around   4. Are you currently experiencing any other symptoms? no

## 2017-02-14 NOTE — Telephone Encounter (Signed)
Returned call to patient. She sees Dr. Sallyanne Kuster routinely. Was last seen by Dr. Sallyanne Kuster in March, also seen by Suanne Marker 1 month ago - no changes were made to her medication regimen at that time, and she was recommended to f/u in 6 months.  Pt reports her weight was up, about 4 lbs extra fluid, unsure why - denies recent medication changes, missed doses, etc. Notes that last night, she took her extra 1/2 tab of lasix she uses PRN. "Peed off 4 lbs this morning".  States "my breathing is still a little off". Sensation of not being able to catch a full breath. I asked about orthopnea or exertional dyspnea, pt was unable to state whether it seemed worse in those instances. She denies fatigue, denies stomach or lower extremity swelling today. States "voice is raspy", but she denies other symptoms of cold/viral illness. No cough, fever, headache, congestion, etc.  Pt out running errands, but will return home early afternoon. Instructed pt to take an extra 1/2 tab lasix today. I recommended she take this when she gets home rather than waiting until night. Asked her to notify us if symptoms unimproved tomorrow or call sooner if worse.

## 2017-03-14 DIAGNOSIS — I129 Hypertensive chronic kidney disease with stage 1 through stage 4 chronic kidney disease, or unspecified chronic kidney disease: Secondary | ICD-10-CM | POA: Diagnosis not present

## 2017-03-14 DIAGNOSIS — I272 Pulmonary hypertension, unspecified: Secondary | ICD-10-CM | POA: Diagnosis not present

## 2017-03-14 DIAGNOSIS — G4733 Obstructive sleep apnea (adult) (pediatric): Secondary | ICD-10-CM | POA: Diagnosis not present

## 2017-03-14 DIAGNOSIS — N184 Chronic kidney disease, stage 4 (severe): Secondary | ICD-10-CM | POA: Diagnosis not present

## 2017-03-14 DIAGNOSIS — M85851 Other specified disorders of bone density and structure, right thigh: Secondary | ICD-10-CM | POA: Diagnosis not present

## 2017-03-14 DIAGNOSIS — E1122 Type 2 diabetes mellitus with diabetic chronic kidney disease: Secondary | ICD-10-CM | POA: Diagnosis not present

## 2017-03-14 DIAGNOSIS — Z23 Encounter for immunization: Secondary | ICD-10-CM | POA: Diagnosis not present

## 2017-03-14 DIAGNOSIS — E7800 Pure hypercholesterolemia, unspecified: Secondary | ICD-10-CM | POA: Diagnosis not present

## 2017-03-14 DIAGNOSIS — M109 Gout, unspecified: Secondary | ICD-10-CM | POA: Diagnosis not present

## 2017-03-14 DIAGNOSIS — Z6835 Body mass index (BMI) 35.0-35.9, adult: Secondary | ICD-10-CM | POA: Diagnosis not present

## 2017-03-14 DIAGNOSIS — D72819 Decreased white blood cell count, unspecified: Secondary | ICD-10-CM | POA: Diagnosis not present

## 2017-03-15 ENCOUNTER — Telehealth: Payer: Self-pay | Admitting: Cardiovascular Disease

## 2017-03-15 NOTE — Telephone Encounter (Signed)
Returned call to pt, she states that at her last OV with RB she forgot to tall her about the extreme leg cramping. She states that it is about 2 times a night about 3x Qweek. She states that she has not had to take any extra lasix lately, there is no erythremia or warmth to her extremities she states that she has had a blood clot before but this is different that that. Denies any LE swelling her weight has been good, she states that Dr C has told her in the past to limit her fluid intake for her CHF and limit salt but she states that she is always watching this, she says that she may be dehydrated but denies anything else. Made 6 month follow up appt w/ dr C in March.

## 2017-03-15 NOTE — Telephone Encounter (Signed)
Agree with plan; last K 4.2 9/18. Kirk Ruths

## 2017-03-15 NOTE — Telephone Encounter (Signed)
New message   Pt verbalized that she is having extreme leg cramps and her PCP told her to contact Dr.Croitoru

## 2017-03-15 NOTE — Telephone Encounter (Signed)
noted 

## 2017-03-15 NOTE — Telephone Encounter (Signed)
Patricia Morgan is returning a call . Thanks

## 2017-03-15 NOTE — Telephone Encounter (Signed)
[id number] 

## 2017-04-05 ENCOUNTER — Other Ambulatory Visit: Payer: Self-pay | Admitting: Hematology & Oncology

## 2017-04-05 DIAGNOSIS — I2699 Other pulmonary embolism without acute cor pulmonale: Secondary | ICD-10-CM

## 2017-04-21 ENCOUNTER — Telehealth: Payer: Self-pay | Admitting: Cardiovascular Disease

## 2017-04-21 NOTE — Telephone Encounter (Signed)
New message     Pt c/o Shortness Of Breath: STAT if SOB developed within the last 24 hours or pt is noticeably SOB on the phone  1. Are you currently SOB (can you hear that pt is SOB on the phone)? Patient states she is SOB  2. How long have you been experiencing SOB? 2 days  3. Are you SOB when sitting or when up moving around? Sitting and moving  4. Are you currently experiencing any other symptoms? Cough

## 2017-04-21 NOTE — Telephone Encounter (Signed)
Returned call to pt she states that she SOB "all the time" she states that her weight is up 4lb she states that she took her extra 1/2 tab if lasix last night. She will take an additional this morning and continue to monitor, she verbalizes that she will go to the ER if sx worsen or when she feels that it is needed . Informed pt that we will be closed but there is always some APP on call to speak with on the phone for further direction if needed

## 2017-05-12 DIAGNOSIS — E1122 Type 2 diabetes mellitus with diabetic chronic kidney disease: Secondary | ICD-10-CM | POA: Diagnosis not present

## 2017-05-12 DIAGNOSIS — M109 Gout, unspecified: Secondary | ICD-10-CM | POA: Diagnosis not present

## 2017-05-12 DIAGNOSIS — E559 Vitamin D deficiency, unspecified: Secondary | ICD-10-CM | POA: Diagnosis not present

## 2017-05-12 DIAGNOSIS — I129 Hypertensive chronic kidney disease with stage 1 through stage 4 chronic kidney disease, or unspecified chronic kidney disease: Secondary | ICD-10-CM | POA: Diagnosis not present

## 2017-05-12 DIAGNOSIS — Z6838 Body mass index (BMI) 38.0-38.9, adult: Secondary | ICD-10-CM | POA: Diagnosis not present

## 2017-05-12 DIAGNOSIS — D631 Anemia in chronic kidney disease: Secondary | ICD-10-CM | POA: Diagnosis not present

## 2017-05-12 DIAGNOSIS — N183 Chronic kidney disease, stage 3 (moderate): Secondary | ICD-10-CM | POA: Diagnosis not present

## 2017-05-15 ENCOUNTER — Other Ambulatory Visit: Payer: Self-pay | Admitting: Cardiovascular Disease

## 2017-05-15 NOTE — Telephone Encounter (Signed)
REFILL 

## 2017-05-17 ENCOUNTER — Encounter: Payer: Self-pay | Admitting: Hematology & Oncology

## 2017-05-25 ENCOUNTER — Other Ambulatory Visit: Payer: Self-pay | Admitting: Cardiovascular Disease

## 2017-07-20 ENCOUNTER — Encounter: Payer: Self-pay | Admitting: Cardiovascular Disease

## 2017-07-20 ENCOUNTER — Ambulatory Visit (INDEPENDENT_AMBULATORY_CARE_PROVIDER_SITE_OTHER): Payer: Medicare HMO | Admitting: Cardiovascular Disease

## 2017-07-20 VITALS — BP 112/71 | HR 81 | Ht 62.0 in | Wt 202.6 lb

## 2017-07-20 DIAGNOSIS — I10 Essential (primary) hypertension: Secondary | ICD-10-CM

## 2017-07-20 DIAGNOSIS — I5042 Chronic combined systolic (congestive) and diastolic (congestive) heart failure: Secondary | ICD-10-CM | POA: Diagnosis not present

## 2017-07-20 DIAGNOSIS — I82409 Acute embolism and thrombosis of unspecified deep veins of unspecified lower extremity: Secondary | ICD-10-CM | POA: Diagnosis not present

## 2017-07-20 DIAGNOSIS — E0822 Diabetes mellitus due to underlying condition with diabetic chronic kidney disease: Secondary | ICD-10-CM

## 2017-07-20 DIAGNOSIS — Z7901 Long term (current) use of anticoagulants: Secondary | ICD-10-CM

## 2017-07-20 DIAGNOSIS — I110 Hypertensive heart disease with heart failure: Secondary | ICD-10-CM

## 2017-07-20 DIAGNOSIS — Z794 Long term (current) use of insulin: Secondary | ICD-10-CM | POA: Diagnosis not present

## 2017-07-20 DIAGNOSIS — N184 Chronic kidney disease, stage 4 (severe): Secondary | ICD-10-CM | POA: Diagnosis not present

## 2017-07-20 DIAGNOSIS — Z6837 Body mass index (BMI) 37.0-37.9, adult: Secondary | ICD-10-CM

## 2017-07-20 DIAGNOSIS — G4733 Obstructive sleep apnea (adult) (pediatric): Secondary | ICD-10-CM | POA: Diagnosis not present

## 2017-07-20 DIAGNOSIS — N183 Chronic kidney disease, stage 3 (moderate): Secondary | ICD-10-CM

## 2017-07-20 NOTE — Progress Notes (Signed)
Cardiology Office Note    Date:  07/21/2017   ID:  Erryn Dickison, DOB 1950-11-03, MRN [id number]  PCP:  Antony Contras, MD  Cardiologist:   Sanda Klein, MD   Chief Complaint  Patient presents with  . Follow-up    pt reports no complaints    History of Present Illness:  Patricia Morgan is a 67 y.o. 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  She has not required any significant diuretic dose escalation or visits to the hospital since her last appointment with Rosaria Ferries in September 2018.  She has lost 4 pounds since then.  Her last visit with her nephrologist, Dr. Moshe Cipro on December creatinine was stable at 2.35.  Coronary angiography performed June 2017 showed no evidence of coronary artery disease but confirmed elevated filling pressure. PA pressure was 57/18 in the setting of a mean pulmonary wedge pressure of 33 mmHg. Echo showed ejection fraction of 45%. Nuclear scintigraphy calculated ejection fraction of 38%. It also suggested the presence of perfusion abnormalities, false positive by coronary angiography.  She is currently on treatment with maximum dose of carvedilol and a relatively low dose of furosemide. She is not on RAAS inhibitors due to moderately advanced kidney disease. Diabetes control is excellent.  He sees Dr. Moshe Cipro for chronic kidney disease and her baseline creatinine is around 2.0-2.5. Her primary care physician is Dr. Antony Contras. Dr. Elsworth Soho manages her sleep apnea treatment.  Past Medical History:  Diagnosis Date  . ANEMIA-NOS   . BREAST CANCER, HX OF 1994 & 1995   1994>>recurrence in 1995, R breast, s/p lumpectomy>>mastectomy  . Cancer ([place])   . CHF (congestive heart  failure) ([place])   . CHRONIC KIDNEY DISEASE STAGE III (MODERATE)   . Clotting disorder ([place])    on eliquis for Factor 5 disorder  . Colon polyps   . DIABETES MELLITUS, TYPE II   . Diverticulosis   . HYPERLIPIDEMIA   . HYPERTENSION   . OBSTRUCTIVE SLEEP APNEA    CPAP  . Proteinuria   . PULMONARY EMBOLISM 09/24/2008   anticoag thru 03/2010  . Sleep apnea    Wears CPAP nightly    Past Surgical History:  Procedure Laterality Date  . CARDIAC CATHETERIZATION N/A 11/02/2015   Procedure: Right/Left Heart Cath and Coronary Angiography;  Surgeon: Burnell Blanks, MD;  Location: [facility];  Service: Cardiovascular;  Laterality: N/A;  . insertion port a cath    . MASTECTOMY  1995   Right  . PORT-A-CATH REMOVAL      Current Medications: Outpatient Medications Prior to Visit  Medication Sig Dispense Refill  . acetaminophen (TYLENOL) 500 MG tablet Take 500 mg by mouth every 6 (six) hours as needed for mild pain.    [name] allopurinol (ZYLOPRIM) 100 MG tablet Take 100 mg by mouth 2 (two) times daily.     . carvedilol (COREG) 25 MG tablet TAKE 1 TABLET (25 MG TOTAL) BY MOUTH 2 (TWO) TIMES DAILY WITH MEALS 180 tablet 3  . colchicine 0.6 MG tablet Take 0.6 mg by mouth daily.   1  . ELIQUIS 5 MG TABS tablet TAKE 1 TABLET TWICE DAILY 180 tablet 6  . furosemide (LASIX) 40 MG tablet Take 1-1.5 tablets (40-60 mg total) by mouth daily as directed. 135 tablet 2  . hydrALAZINE (APRESOLINE) 10  MG tablet TAKE 1 TABLET(10 MG) BY MOUTH THREE TIMES DAILY 270 tablet 3  . Insulin Glargine (LANTUS SOLOSTAR) 100 UNIT/ML Solostar Pen Inject 15 Units into the skin as needed. Per sliding scale.     . insulin lispro (HUMALOG KWIKPEN) 100 UNIT/ML KiwkPen Inject 10 Units into the skin as needed.     . isosorbide mononitrate (IMDUR) 30 MG 24 hr tablet TAKE 1 TABLET(30 MG) BY MOUTH DAILY 90 tablet 3  . rosuvastatin (CRESTOR) 10 MG tablet TAKE 1 TABLET  DAILY. 90 tablet 1  . TRUE METRIX BLOOD GLUCOSE TEST test  strip     . neomycin-polymyxin-hydrocortisone (CORTISPORIN) 3.5-10000-1 otic suspension SHAKE LIQUID AND INSTILL 3 DROPS IN RIGHT EAR THREE TIMES DAILY (Patient not taking: Reported on 07/20/2017) 10 mL 0   Facility-Administered Medications Prior to Visit  Medication Dose Route Frequency Provider Last Rate Last Dose  . 0.9 %  sodium chloride infusion  500 mL Intravenous Continuous Irene Shipper, MD         Allergies:   Patient has no known allergies.   Social History   Socioeconomic History  . Marital status: Divorced    Spouse name: None  . Number of children: 2  . Years of education: 78  . Highest education level: None  Social Needs  . Financial resource strain: None  . Food insecurity - worry: None  . Food insecurity - inability: None  . Transportation needs - medical: None  . Transportation needs - non-medical: None  Occupational History  . Occupation: retired  Tobacco Use  . Smoking status: Never Smoker  . Smokeless tobacco: Never Used  . Tobacco comment: Lives alone-divorced  Substance and Sexual Activity  . Alcohol use: No    Alcohol/week: 0.0 oz  . Drug use: No  . Sexual activity: None  Other Topics Concern  . None  Social History Narrative  . None     Family History:  The patient's family history includes Asthma in her father; Diabetes in her sister; Parkinson's disease in her mother. Her sister had long QT syndrome, but no documented ventricular arrhythmia  ROS:   Please see the history of present illness.    ROS All other systems reviewed and are negative.   PHYSICAL EXAM:   VS:  BP 112/71   Pulse 81   Ht 5\' 2"  (1.575 m)   Wt 202 lb 9.6 oz (91.9 kg)   BMI 37.06 kg/m     General: Alert, oriented x3, no distress, moderately obese Head: no evidence of trauma, PERRL, EOMI, no exophtalmos or lid lag, no myxedema, no xanthelasma; normal ears, nose and oropharynx Neck: normal jugular venous pulsations and no hepatojugular reflux; brisk carotid pulses  without delay and no carotid bruits Chest: clear to auscultation, no signs of consolidation by percussion or palpation, normal fremitus, symmetrical and full respiratory excursions Cardiovascular: normal position and quality of the apical impulse, regular rhythm, normal first and second heart sounds, no murmurs, rubs or gallops Abdomen: no tenderness or distention, no masses by palpation, no abnormal pulsatility or arterial bruits, normal bowel sounds, no hepatosplenomegaly Extremities: no clubbing, cyanosis or edema; 2+ radial, ulnar and brachial pulses bilaterally; 2+ right femoral, posterior tibial and dorsalis pedis pulses; 2+ left femoral, posterior tibial and dorsalis pedis pulses; no subclavian or femoral bruits Neurological: grossly nonfocal Psych: Normal mood and affect   Wt Readings from Last 3 Encounters:  07/20/17 202 lb 9.6 oz (91.9 kg)  01/23/17 206 lb 12.8 oz (93.8 kg)  12/07/16 204 lb (92.5 kg)      Studies/Labs Reviewed:   EKG:  EKG is ordered today.  The ekg shows sinus rhythm with first-degree AV block, voltage criteria for LVH and leftward axis, normal repolarization  Recent Labs: 12/07/2016: ALT(SGPT) 29; HGB 12.1; Platelets 194 01/23/2017: BUN 33; Creatinine, Ser 1.97; Potassium 4.2; Sodium 140   Lipid Panel    Component Value Date/Time   CHOL 184 09/13/2012 1357   TRIG 142 09/13/2012 1357   HDL 45 09/13/2012 1357   CHOLHDL 4.1 09/13/2012 1357   VLDL 28 09/13/2012 1357   LDLCALC 111 (H) 09/13/2012 1357   LDLDIRECT 141.3 10/14/2008 1004      ASSESSMENT:    1. Chronic combined systolic and diastolic CHF (congestive heart failure) ([place])   2. Benign essential HTN   3. Recurrent deep vein thrombosis (DVT) ([facility])   4. Long term (current) use of anticoagulants   5. Hypertensive heart disease with chronic combined systolic and diastolic congestive heart failure ([place])   6. OSA (obstructive sleep apnea)   7. CKD (chronic kidney disease), stage IV ([facility])   8.  Class 2 severe obesity due to excess calories with serious comorbidity and body mass index (BMI) of 37.0 to 37.9 in adult ([place])   9. Diabetes mellitus due to underlying condition with stage 3 chronic kidney disease, with long-term current use of insulin ([facility])      PLAN:  In order of problems listed above:  1. CHF: Compensated, euvolemic, NYHA functional class I.  We have to reestablish her "dry weight", which is clearly lower than it was estimated to be in the past, as she continues to lose true weight.  Today, euvolemic at 202 pounds.  Carvedilol, max dose.  Unable to prescribe RAAS inhibitors due to renal dysfunction, on hydralazine/nitrates vasodilator therapy. 2. Recurrent DVT/PE:  hypercoagulable state due to heterozygous factor V Leiden. Should remain on lifelong anticoagulation. 3. Eliquis: Tolerated without bleeding problems 4. HTN: Excellent control 5. OSA: Reports 100% compliance with CPAP.  6. CKD: Baseline creatinine around 2.0. GFR approximately 25 7. Obesity: Graduated on weight loss, she is determined to continue slowly losing weight. 8. DM: Reports good control.   Medication Adjustments/Labs and Tests Ordered: Current medicines are reviewed at length with the patient today.  Concerns regarding medicines are outlined above.  Medication changes, Labs and Tests ordered today are listed in the Patient Instructions below. Patient Instructions  Dr Sallyanne Kuster recommends that you schedule a follow-up appointment in 12 months. You will receive a reminder letter in the mail two months in advance. If you don't receive a letter, please call our office to schedule the follow-up appointment.  If you need a refill on your cardiac medications before your next appointment, please call your pharmacy.    Signed, Sanda Klein, MD  07/21/2017 6:22 PM    [facility] [place], [place], [place]  [address] Phone: [phone]; Fax: [phone]

## 2017-07-20 NOTE — Patient Instructions (Signed)
Dr Croitoru recommends that you schedule a follow-up appointment in 12 months. You will receive a reminder letter in the mail two months in advance. If you don't receive a letter, please call our office to schedule the follow-up appointment.  If you need a refill on your cardiac medications before your next appointment, please call your pharmacy. 

## 2017-08-16 DIAGNOSIS — M7062 Trochanteric bursitis, left hip: Secondary | ICD-10-CM | POA: Diagnosis not present

## 2017-09-14 DIAGNOSIS — G4733 Obstructive sleep apnea (adult) (pediatric): Secondary | ICD-10-CM | POA: Diagnosis not present

## 2017-09-22 ENCOUNTER — Encounter (HOSPITAL_BASED_OUTPATIENT_CLINIC_OR_DEPARTMENT_OTHER): Payer: Self-pay | Admitting: *Deleted

## 2017-09-22 ENCOUNTER — Emergency Department (HOSPITAL_BASED_OUTPATIENT_CLINIC_OR_DEPARTMENT_OTHER): Payer: Medicare HMO

## 2017-09-22 ENCOUNTER — Emergency Department (HOSPITAL_BASED_OUTPATIENT_CLINIC_OR_DEPARTMENT_OTHER)
Admission: EM | Admit: 2017-09-22 | Discharge: 2017-09-22 | Disposition: A | Discharge status: Home / Self Care | Payer: Medicare HMO | Attending: Emergency Medicine | Admitting: Emergency Medicine

## 2017-09-22 ENCOUNTER — Other Ambulatory Visit: Payer: Self-pay

## 2017-09-22 DIAGNOSIS — E119 Type 2 diabetes mellitus without complications: Secondary | ICD-10-CM | POA: Insufficient documentation

## 2017-09-22 DIAGNOSIS — I130 Hypertensive heart and chronic kidney disease with heart failure and stage 1 through stage 4 chronic kidney disease, or unspecified chronic kidney disease: Secondary | ICD-10-CM | POA: Insufficient documentation

## 2017-09-22 DIAGNOSIS — Z79899 Other long term (current) drug therapy: Secondary | ICD-10-CM | POA: Insufficient documentation

## 2017-09-22 DIAGNOSIS — J00 Acute nasopharyngitis [common cold]: Secondary | ICD-10-CM | POA: Diagnosis not present

## 2017-09-22 DIAGNOSIS — R05 Cough: Secondary | ICD-10-CM | POA: Diagnosis present

## 2017-09-22 DIAGNOSIS — I5042 Chronic combined systolic (congestive) and diastolic (congestive) heart failure: Secondary | ICD-10-CM | POA: Diagnosis not present

## 2017-09-22 DIAGNOSIS — R918 Other nonspecific abnormal finding of lung field: Secondary | ICD-10-CM | POA: Diagnosis not present

## 2017-09-22 DIAGNOSIS — N183 Chronic kidney disease, stage 3 (moderate): Secondary | ICD-10-CM | POA: Insufficient documentation

## 2017-09-22 LAB — URINALYSIS, ROUTINE W REFLEX MICROSCOPIC
Bilirubin Urine: NEGATIVE
Glucose, UA: NEGATIVE mg/dL
Hgb urine dipstick: NEGATIVE
Ketones, ur: NEGATIVE mg/dL
Nitrite: NEGATIVE
PROTEIN: 30 mg/dL — AB
Specific Gravity, Urine: 1.01 (ref 1.005–1.030)
pH: 7.5 (ref 5.0–8.0)

## 2017-09-22 LAB — URINALYSIS, MICROSCOPIC (REFLEX)

## 2017-09-22 MED ORDER — AZITHROMYCIN 250 MG PO TABS: 250.0000 mg | ORAL_TABLET | Freq: Every day | ORAL | 0 refills | Status: DC

## 2017-09-22 MED ORDER — BENZONATATE 100 MG PO CAPS: 100.0000 mg | ORAL_CAPSULE | Freq: Three times a day (TID) | ORAL | 0 refills | Status: DC

## 2017-09-22 NOTE — ED Provider Notes (Signed)
[facility] EMERGENCY DEPARTMENT Provider Note   CSN: [id number] Arrival date & time: 09/22/17  1635     History   Chief Complaint Chief Complaint  Patient presents with  . URI    HPI Patricia Morgan is a 67 y.o. female.  67 yo F with a chief complaint of cough congestion subjective fevers and chills.  Is been going on for the past 3 or 4 days.  She is unsure of sick contacts.  Denies nausea or vomiting denies abdominal pain.  Started having some dysuria this morning.  Denies flank pain.  She is concerned because she is going to a family get together in [place] over the weekend and she wants something to make her better.  She has a history of heart failure but thinks that this feels different.  Denies any increased swelling to her legs.  Denies chest pain.  Nuys orthopnea or PND.  The history is provided by the patient.  URI   This is a new problem. The current episode started more than 2 days ago. The problem has not changed since onset.Maximum temperature: subjective. The fever has been present for 1 to 2 days. Associated symptoms include dysuria, congestion and cough. Pertinent negatives include no chest pain, no nausea, no vomiting, no headaches, no rhinorrhea and no wheezing. She has tried rest and sleep for the symptoms. The treatment provided no relief.    Past Medical History:  Diagnosis Date  . ANEMIA-NOS   . BREAST CANCER, HX OF 1994 & 1995   1994>>recurrence in 1995, R breast, s/p lumpectomy>>mastectomy  . Cancer ([place])   . CHF (congestive heart failure) ([place])   . CHRONIC KIDNEY DISEASE STAGE III (MODERATE)   . Clotting disorder ([place])    on eliquis for Factor 5 disorder  . Colon polyps   . DIABETES MELLITUS, TYPE II   . Diverticulosis   . HYPERLIPIDEMIA   . HYPERTENSION   . OBSTRUCTIVE SLEEP APNEA    CPAP  . Proteinuria   . PULMONARY EMBOLISM 09/24/2008   anticoag thru 03/2010  . Sleep apnea    Wears CPAP nightly    Patient Active Problem List   Diagnosis Date Noted  . Factor V Leiden ([place]) 01/12/2016  . Chronic combined systolic and diastolic CHF (congestive heart failure) ([place]) 11/03/2015  . Hypertensive heart disease 11/03/2015  . Non-ischemic cardiomyopathy ([place])   . Abnormal nuclear stress test 11/01/2015  . Chronic renal failure 11/01/2015  . Unstable angina ([place]) 11/01/2015  . Encounter for therapeutic drug monitoring 09/04/2015  . Popliteal DVT (deep venous thrombosis) ([place]) 08/31/2015  . Left ventricular systolic dysfunction, undermined duration 08/31/2015  . SOB (shortness of breath) 08/30/2015  . Obesity 08/30/2015  . Leg pain, left 08/30/2015  . DVT (deep venous thrombosis) ([place]) 09/23/2014  . Pulmonary embolism ([place])   . Otitis, externa 09/17/2013  . Right knee pain 09/17/2013  . Low back pain 01/08/2013  . Unspecified constipation 07/16/2012  . Preventative health care 05/30/2012  . CKD (chronic kidney disease), stage IV ([place]) 06/26/2009  . PROTEINURIA 06/26/2009  . OSA (obstructive sleep apnea) 10/14/2008  . Diabetes mellitus ([place]) 09/29/2008  . Benign essential HTN 09/26/2008  . ANEMIA-NOS 09/26/2008  . BREAST CANCER, HX OF 09/26/2008    Past Surgical History:  Procedure Laterality Date  . CARDIAC CATHETERIZATION N/A 11/02/2015   Procedure: Right/Left Heart Cath and Coronary Angiography;  Surgeon: Burnell Blanks, MD;  Location: [facility];  Service: Cardiovascular;  Laterality: N/A;  .  insertion port a cath    . MASTECTOMY  1995   Right  . PORT-A-CATH REMOVAL       OB History   None      Home Medications    Prior to Admission medications   Medication Sig Start Date End Date Taking? Authorizing Provider  acetaminophen (TYLENOL) 500 MG tablet Take 500 mg by mouth every 6 (six) hours as needed for mild pain.    [provider]  allopurinol (ZYLOPRIM) 100 MG tablet Take 100 mg by mouth 2 (two) times daily.     [provider]  azithromycin (ZITHROMAX) 250 MG  tablet Take 1 tablet (250 mg total) by mouth daily. Take first 2 tablets together, then 1 every day until finished. 09/22/17   Deno Etienne, DO  benzonatate (TESSALON) 100 MG capsule Take 1 capsule (100 mg total) by mouth every 8 (eight) hours. 09/22/17   Deno Etienne, DO  carvedilol (COREG) 25 MG tablet TAKE 1 TABLET (25 MG TOTAL) BY MOUTH 2 (TWO) TIMES DAILY WITH MEALS 05/25/17   Croitoru, Mihai, MD  colchicine 0.6 MG tablet Take 0.6 mg by mouth daily.  07/13/14   [provider]  ELIQUIS 5 MG TABS tablet TAKE 1 TABLET TWICE DAILY 04/05/17   Volanda Napoleon, MD  furosemide (LASIX) 40 MG tablet Take 1-1.5 tablets (40-60 mg total) by mouth daily as directed. 09/08/16   Croitoru, Mihai, MD  hydrALAZINE (APRESOLINE) 10 MG tablet TAKE 1 TABLET(10 MG) BY MOUTH THREE TIMES DAILY 11/28/16   Croitoru, Mihai, MD  Insulin Glargine (LANTUS SOLOSTAR) 100 UNIT/ML Solostar Pen Inject 15 Units into the skin as needed. Per sliding scale.     [provider]  insulin lispro (HUMALOG KWIKPEN) 100 UNIT/ML KiwkPen Inject 10 Units into the skin as needed.     [provider]  isosorbide mononitrate (IMDUR) 30 MG 24 hr tablet TAKE 1 TABLET(30 MG) BY MOUTH DAILY 11/28/16   Croitoru, Mihai, MD  rosuvastatin (CRESTOR) 10 MG tablet TAKE 1 TABLET  DAILY. 05/15/17   Croitoru, Mihai, MD  TRUE METRIX BLOOD GLUCOSE TEST test strip  02/29/16   [provider]  fluticasone (FLONASE) 50 MCG/ACT nasal spray Place 2 sprays into the nose daily. 12/16/10 08/03/11  Rowe Clack, MD    Family History Family History  Problem Relation Age of Onset  . Parkinson's disease Mother   . Asthma Father   . Diabetes Sister   . Colon cancer Neg Hx   . Stomach cancer Neg Hx   . Esophageal cancer Neg Hx   . Rectal cancer Neg Hx   . Liver cancer Neg Hx     Social History Social History   Tobacco Use  . Smoking status: Never Smoker  . Smokeless tobacco: Never Used  . Tobacco comment: Lives alone-divorced    Substance Use Topics  . Alcohol use: No    Alcohol/week: 0.0 oz  . Drug use: No     Allergies   Patient has no known allergies.   Review of Systems Review of Systems  Constitutional: Positive for chills and fever (subjective).  HENT: Positive for congestion. Negative for rhinorrhea.   Eyes: Negative for redness and visual disturbance.  Respiratory: Positive for cough. Negative for shortness of breath and wheezing.   Cardiovascular: Negative for chest pain and palpitations.  Gastrointestinal: Negative for nausea and vomiting.  Genitourinary: Positive for dysuria. Negative for urgency.  Musculoskeletal: Negative for arthralgias and myalgias.  Skin: Negative for pallor and wound.  Neurological: Negative for dizziness and headaches.     Physical Exam Updated Vital Signs BP 116/65   Pulse 84   Temp 99.3 F (37.4 C) (Oral)   Resp 20   Ht 5\' 2"  (1.575 m)   Wt 90.7 kg (200 lb)   SpO2 94%   BMI 36.58 kg/m   Physical Exam  Constitutional: She is oriented to person, place, and time. She appears well-developed and well-nourished. No distress.  HENT:  Head: Normocephalic and atraumatic.  Swollen turbinates, posterior nasal drip, no noted sinus ttp, tm normal bilaterally.    Eyes: Pupils are equal, round, and reactive to light. EOM are normal.  Neck: Normal range of motion. Neck supple.  Cardiovascular: Normal rate and regular rhythm. Exam reveals no gallop and no friction rub.  No murmur heard. Pulmonary/Chest: Effort normal. She has no wheezes. She has no rales.  Abdominal: Soft. She exhibits no distension. There is no tenderness.  Musculoskeletal: She exhibits no edema or tenderness.  Neurological: She is alert and oriented to person, place, and time.  Skin: Skin is warm and dry. She is not diaphoretic.  Psychiatric: She has a normal mood and affect. Her behavior is normal.  Nursing note and vitals reviewed.    ED Treatments / Results  Labs (all labs ordered are  listed, but only abnormal results are displayed) Labs Reviewed  URINALYSIS, ROUTINE W REFLEX MICROSCOPIC - Abnormal; Notable for the following components:      Result Value   Protein, ur 30 (*)    Leukocytes, UA SMALL (*)    All other components within normal limits  URINALYSIS, MICROSCOPIC (REFLEX) - Abnormal; Notable for the following components:   Bacteria, UA FEW (*)    All other components within normal limits    EKG None  Radiology Dg Chest 2 View  Result Date: 09/22/2017 CLINICAL DATA:  Cold like symptoms for 4 days, fever EXAM: CHEST - 2 VIEW COMPARISON:  07/21/2016 FINDINGS: There is mild bilateral interstitial thickening, right greater than left which is similar in appearance to multiple prior examinations likely reflecting chronic changes. There is no focal consolidation. There is no pleural effusion or pneumothorax. There is mild stable cardiomegaly. The osseous structures are unremarkable. IMPRESSION: No active cardiopulmonary disease. Bilateral chronic interstitial thickening similar in appearance to 07/21/2016. Electronically Signed   By: Kathreen Devoid   On: 09/22/2017 17:04    Procedures Procedures (including critical care time)  Medications Ordered in ED Medications - No data to display   Initial Impression / Assessment and Plan / ED Course  I have reviewed the triage vital signs and the nursing notes.  Pertinent labs & imaging results that were available during my care of the patient were reviewed by me and considered in my medical decision making (see chart for details).     63 y oF with a likely viral illness.  Patient is well-appearing and nontoxic.  Chest x-ray with no acute changes.  My view of the chest x-ray with no focal consolidation.  Will obtain a urine as she is having urinary symptoms. UA negative.  Patient requesting antibiotic therapy.  Discussed limited utility of antibiotics.  PCP follow up.   6:23 PM:  I have discussed the  diagnosis/risks/treatment options with the patient and believe the pt to be eligible for discharge home to follow-up with PCP. We also discussed returning to the ED immediately if new or worsening sx occur. We discussed the sx which are most concerning (e.g., sudden worsening pain,  fever, inability to tolerate by mouth) that necessitate immediate return. Medications administered to the patient during their visit and any new prescriptions provided to the patient are listed below.  Medications given during this visit Medications - No data to display  The patient appears reasonably screen and/or stabilized for discharge and I doubt any other medical condition or other [facility] requiring further screening, evaluation, or treatment in the ED at this time prior to discharge.    Final Clinical Impressions(s) / ED Diagnoses   Final diagnoses:  Acute nasopharyngitis    ED Discharge Orders        Ordered    azithromycin (ZITHROMAX) 250 MG tablet  Daily     09/22/17 1806    benzonatate (TESSALON) 100 MG capsule  Every 8 hours     09/22/17 1806       Deno Etienne, DO 09/22/17 1823

## 2017-09-22 NOTE — ED Notes (Signed)
Patient transported to X-ray 

## 2017-09-22 NOTE — ED Triage Notes (Signed)
Pt c/o URi symtpoms with pro cough x 3 dasy

## 2017-09-22 NOTE — Discharge Instructions (Signed)
Most likely her symptoms are caused by a virus.  Chest x-ray and urine are negative for infection.  Please follow-up with your family physician.  Return for any worsening shortness of breath

## 2017-10-14 ENCOUNTER — Other Ambulatory Visit: Payer: Self-pay | Admitting: Cardiovascular Disease

## 2017-10-17 NOTE — Telephone Encounter (Signed)
Rx sent to pharmacy   

## 2017-10-18 ENCOUNTER — Other Ambulatory Visit: Payer: Self-pay | Admitting: Family Medicine

## 2017-10-18 DIAGNOSIS — Z1231 Encounter for screening mammogram for malignant neoplasm of breast: Secondary | ICD-10-CM

## 2017-10-20 ENCOUNTER — Ambulatory Visit
Admission: RE | Admit: 2017-10-20 | Discharge: 2017-10-20 | Disposition: A | Discharge status: Home / Self Care | Payer: Medicare HMO | Source: Ambulatory Visit | Attending: Family Medicine | Admitting: Family Medicine

## 2017-10-20 DIAGNOSIS — Z1231 Encounter for screening mammogram for malignant neoplasm of breast: Secondary | ICD-10-CM

## 2017-10-20 HISTORY — DX: Personal history of antineoplastic chemotherapy: Z92.21

## 2017-12-06 ENCOUNTER — Other Ambulatory Visit: Payer: Self-pay | Admitting: Cardiovascular Disease

## 2017-12-06 NOTE — Telephone Encounter (Signed)
Rx sent to pharmacy   

## 2017-12-10 ENCOUNTER — Encounter (HOSPITAL_BASED_OUTPATIENT_CLINIC_OR_DEPARTMENT_OTHER): Payer: Self-pay | Admitting: Emergency Medicine

## 2017-12-10 ENCOUNTER — Other Ambulatory Visit: Payer: Self-pay

## 2017-12-10 ENCOUNTER — Emergency Department (HOSPITAL_BASED_OUTPATIENT_CLINIC_OR_DEPARTMENT_OTHER): Payer: Medicare HMO

## 2017-12-10 ENCOUNTER — Emergency Department (HOSPITAL_BASED_OUTPATIENT_CLINIC_OR_DEPARTMENT_OTHER)
Admission: EM | Admit: 2017-12-10 | Discharge: 2017-12-10 | Disposition: A | Discharge status: Home / Self Care | Payer: Medicare HMO | Attending: Emergency Medicine | Admitting: Emergency Medicine

## 2017-12-10 DIAGNOSIS — N189 Chronic kidney disease, unspecified: Secondary | ICD-10-CM | POA: Diagnosis not present

## 2017-12-10 DIAGNOSIS — R748 Abnormal levels of other serum enzymes: Secondary | ICD-10-CM | POA: Insufficient documentation

## 2017-12-10 DIAGNOSIS — Z9011 Acquired absence of right breast and nipple: Secondary | ICD-10-CM | POA: Insufficient documentation

## 2017-12-10 DIAGNOSIS — E1122 Type 2 diabetes mellitus with diabetic chronic kidney disease: Secondary | ICD-10-CM | POA: Diagnosis not present

## 2017-12-10 DIAGNOSIS — R0602 Shortness of breath: Secondary | ICD-10-CM | POA: Diagnosis not present

## 2017-12-10 DIAGNOSIS — R609 Edema, unspecified: Secondary | ICD-10-CM | POA: Diagnosis not present

## 2017-12-10 DIAGNOSIS — D649 Anemia, unspecified: Secondary | ICD-10-CM | POA: Insufficient documentation

## 2017-12-10 DIAGNOSIS — Z79899 Other long term (current) drug therapy: Secondary | ICD-10-CM | POA: Insufficient documentation

## 2017-12-10 DIAGNOSIS — Z7901 Long term (current) use of anticoagulants: Secondary | ICD-10-CM | POA: Insufficient documentation

## 2017-12-10 DIAGNOSIS — R7989 Other specified abnormal findings of blood chemistry: Secondary | ICD-10-CM

## 2017-12-10 DIAGNOSIS — I5042 Chronic combined systolic (congestive) and diastolic (congestive) heart failure: Secondary | ICD-10-CM | POA: Diagnosis not present

## 2017-12-10 DIAGNOSIS — I5043 Acute on chronic combined systolic (congestive) and diastolic (congestive) heart failure: Secondary | ICD-10-CM | POA: Diagnosis not present

## 2017-12-10 DIAGNOSIS — Z794 Long term (current) use of insulin: Secondary | ICD-10-CM | POA: Diagnosis not present

## 2017-12-10 DIAGNOSIS — Z853 Personal history of malignant neoplasm of breast: Secondary | ICD-10-CM | POA: Diagnosis not present

## 2017-12-10 DIAGNOSIS — I130 Hypertensive heart and chronic kidney disease with heart failure and stage 1 through stage 4 chronic kidney disease, or unspecified chronic kidney disease: Secondary | ICD-10-CM | POA: Diagnosis not present

## 2017-12-10 DIAGNOSIS — R0789 Other chest pain: Secondary | ICD-10-CM | POA: Diagnosis not present

## 2017-12-10 DIAGNOSIS — R778 Other specified abnormalities of plasma proteins: Secondary | ICD-10-CM

## 2017-12-10 DIAGNOSIS — N184 Chronic kidney disease, stage 4 (severe): Secondary | ICD-10-CM | POA: Diagnosis not present

## 2017-12-10 DIAGNOSIS — R0609 Other forms of dyspnea: Secondary | ICD-10-CM | POA: Diagnosis not present

## 2017-12-10 LAB — CBC WITH DIFFERENTIAL/PLATELET
BASOS PCT: 0 %
Basophils Absolute: 0 10*3/uL (ref 0.0–0.1)
EOS ABS: 0.1 10*3/uL (ref 0.0–0.7)
EOS PCT: 2 %
HCT: 32.5 % — ABNORMAL LOW (ref 36.0–46.0)
HEMOGLOBIN: 10.3 g/dL — AB (ref 12.0–15.0)
Lymphocytes Relative: 33 %
Lymphs Abs: 1.4 10*3/uL (ref 0.7–4.0)
MCH: 30.6 pg (ref 26.0–34.0)
MCHC: 31.7 g/dL (ref 30.0–36.0)
MCV: 96.4 fL (ref 78.0–100.0)
Monocytes Absolute: 0.4 10*3/uL (ref 0.1–1.0)
Monocytes Relative: 9 %
NEUTROS PCT: 56 %
Neutro Abs: 2.3 10*3/uL (ref 1.7–7.7)
PLATELETS: 167 10*3/uL (ref 150–400)
RBC: 3.37 MIL/uL — AB (ref 3.87–5.11)
RDW: 14.9 % (ref 11.5–15.5)
WBC: 4.1 10*3/uL (ref 4.0–10.5)

## 2017-12-10 LAB — BASIC METABOLIC PANEL
Anion gap: 9 (ref 5–15)
BUN: 37 mg/dL — ABNORMAL HIGH (ref 8–23)
CALCIUM: 8.9 mg/dL (ref 8.9–10.3)
CHLORIDE: 109 mmol/L (ref 98–111)
CO2: 22 mmol/L (ref 22–32)
CREATININE: 2.36 mg/dL — AB (ref 0.44–1.00)
GFR, EST AFRICAN AMERICAN: 23 mL/min — AB (ref >60)
GFR, EST NON AFRICAN AMERICAN: 20 mL/min — AB (ref >60)
Glucose, Bld: 106 mg/dL — ABNORMAL HIGH (ref 70–99)
Potassium: 4.4 mmol/L (ref 3.5–5.1)
Sodium: 140 mmol/L (ref 135–145)

## 2017-12-10 LAB — TROPONIN I
TROPONIN I: 0.03 ng/mL — AB (ref <0.03)
Troponin I: 0.04 ng/mL (ref <0.03)

## 2017-12-10 LAB — BRAIN NATRIURETIC PEPTIDE: B NATRIURETIC PEPTIDE 5: 1143.1 pg/mL — AB (ref 0.0–100.0)

## 2017-12-10 MED ORDER — FUROSEMIDE 10 MG/ML IJ SOLN
40.0000 mg | Freq: Once | INTRAMUSCULAR | Status: AC
  Administered 2017-12-10: 40 mg via INTRAVENOUS
  Filled 2017-12-10: qty 4

## 2017-12-10 NOTE — ED Notes (Signed)
Pt given d/c instructions as per chart. Verbalizes understanding. No questions. 

## 2017-12-10 NOTE — Discharge Instructions (Addendum)
Your symptoms are likely from a Congestive Heart Failure exacerbation. Increase your lasix to 40mg  TWICE daily for the next 3 days starting tomorrow morning, then go back to your usual dose. Continue all of your other usual home medications. Eat a low salt diet, and restrict your fluid intake. Use compression socks and elevate your legs to help bring the fluid out of your legs. Follow up with your cardiologist in the next 2-3 days for recheck of symptoms and ongoing management of your conditions. Return to the ER for emergent changes or worsening symptoms.

## 2017-12-10 NOTE — ED Notes (Addendum)
Pt reports she gained 6lb since Friday. States she took an extra dose of lasix last night but did not have any output. Pt extremely dyspneic with exertion but rapid improved at rest

## 2017-12-10 NOTE — ED Triage Notes (Signed)
SOB x 2 days. Hx of CHF. States she has gained 6 lbs in 2 days. She doubled her lasix dose without change in symptoms. Denies recent illness.

## 2017-12-10 NOTE — ED Notes (Signed)
Pt ambulated without difficulty. SPO2 ranged from 95-98%. PA notified.

## 2017-12-10 NOTE — ED Provider Notes (Signed)
[facility] Provider Note   CSN: [id number] Arrival date & time: 12/10/17  1600     History   Chief Complaint Chief Complaint  Patient presents with  . Shortness of Breath    HPI Patricia Morgan is a 67 y.o. female with a PMHx of CHF (EF 45% in 08/2015), NICM, anemia, CKD4, recurrent DVT/PE due to factor V leiden on chronic eliquis, HTN, HLD, DM2, OSA on CPAP, and other conditions listed below, who presents to the ED with complaints of shortness of breath with exertion over the last 2 days with associated weight gain.  Patient states that this feels like prior CHF exacerbations.  She has noticed that she gets very short of breath with exertion, improves with rest.  She has gained 6 pounds in 2 days, going from 200 to 206 lbs.  She tried doubling her Lasix, taking 80 mg last night (double from her usual 40mg  QD), but did not receive an adequate response and had no improvement in her symptoms.  She has also noticed swelling in her legs and abdomen and she has had 4 pillow orthopnea which is up from 2.  She will intermittently have a mild left-sided chest pain that feels "like pins" and is nonradiating, but not having any CP currently.  She mentions that she has a chronic unchanged dry cough with wheezing which is related to her CHF but denies any changes/worsening in this recently, denies sputum production or hemoptysis.  She states this all feels like prior CHF exacerbations, and usually she comes in and gets "liquid Lasix" and improves.  Chart review reveals that she had an ED visit in 07/2016 for very similar complaints, received 40mg  IV lasix and improved and was discharged.  She is compliant with all of her medications, including her Eliquis as well as all of her blood pressure medications and her Lasix.  Her cardiologist is Dr. Sallyanne Kuster and her PCP is Dr. [name] at [facility].  She is a nonsmoker, no known FHx of cardiac disease aside from CHF in her sister.   She mentions that she stays very active, going to the [facility] 4x/wk and tries to "stay busy".    She denies diaphoresis, lightheadedness, fevers, chills, hemoptysis, URI symptoms, change in cough/wheezing, sputum production, recent travel/surgery/immobilization, estrogen use, abd pain, N/V/D/C, hematuria, dysuria, myalgias, arthralgias, claudication, numbness, tingling, focal weakness, or any other complaints at this time.  The history is provided by the patient and medical records. No language interpreter was used.    Past Medical History:  Diagnosis Date  . ANEMIA-NOS   . BREAST CANCER, HX OF 1994 & 1995   1994>>recurrence in 1995, R breast, s/p lumpectomy>>mastectomy  . Cancer ([place])   . CHF (congestive heart failure) ([place])   . CHRONIC KIDNEY DISEASE STAGE III (MODERATE)   . Clotting disorder ([place])    on eliquis for Factor 5 disorder  . Colon polyps   . DIABETES MELLITUS, TYPE II   . Diverticulosis   . HYPERLIPIDEMIA   . HYPERTENSION   . OBSTRUCTIVE SLEEP APNEA    CPAP  . Personal history of chemotherapy   . Proteinuria   . PULMONARY EMBOLISM 09/24/2008   anticoag thru 03/2010  . Sleep apnea    Wears CPAP nightly    Patient Active Problem List   Diagnosis Date Noted  . Factor V Leiden ([place]) 01/12/2016  . Chronic combined systolic and diastolic CHF (congestive heart failure) ([place]) 11/03/2015  . Hypertensive heart disease 11/03/2015  .  Non-ischemic cardiomyopathy ([place])   . Abnormal nuclear stress test 11/01/2015  . Chronic renal failure 11/01/2015  . Unstable angina ([place]) 11/01/2015  . Encounter for therapeutic drug monitoring 09/04/2015  . Popliteal DVT (deep venous thrombosis) ([place]) 08/31/2015  . Left ventricular systolic dysfunction, undermined duration 08/31/2015  . SOB (shortness of breath) 08/30/2015  . Obesity 08/30/2015  . Leg pain, left 08/30/2015  . DVT (deep venous thrombosis) ([place]) 09/23/2014  . Pulmonary embolism ([place])   . Otitis, externa 09/17/2013  . Right  knee pain 09/17/2013  . Low back pain 01/08/2013  . Unspecified constipation 07/16/2012  . Preventative health care 05/30/2012  . CKD (chronic kidney disease), stage IV ([place]) 06/26/2009  . PROTEINURIA 06/26/2009  . OSA (obstructive sleep apnea) 10/14/2008  . Diabetes mellitus ([place]) 09/29/2008  . Benign essential HTN 09/26/2008  . ANEMIA-NOS 09/26/2008  . BREAST CANCER, HX OF 09/26/2008    Past Surgical History:  Procedure Laterality Date  . BREAST SURGERY    . CARDIAC CATHETERIZATION N/A 11/02/2015   Procedure: Right/Left Heart Cath and Coronary Angiography;  Surgeon: Burnell Blanks, MD;  Location: [facility];  Service: Cardiovascular;  Laterality: N/A;  . insertion port a cath    . MASTECTOMY Right 1995   Right  . PORT-A-CATH REMOVAL       OB History   None      Home Medications    Prior to Admission medications   Medication Sig Start Date End Date Taking? Authorizing Provider  acetaminophen (TYLENOL) 500 MG tablet Take 500 mg by mouth every 6 (six) hours as needed for mild pain.   Yes [provider]  allopurinol (ZYLOPRIM) 100 MG tablet Take 100 mg by mouth 2 (two) times daily.    Yes [provider]  carvedilol (COREG) 25 MG tablet TAKE 1 TABLET (25 MG TOTAL) BY MOUTH 2 (TWO) TIMES DAILY WITH MEALS 05/25/17  Yes Croitoru, Mihai, MD  colchicine 0.6 MG tablet Take 0.6 mg by mouth daily.  07/13/14  Yes [provider]  ELIQUIS 5 MG TABS tablet TAKE 1 TABLET TWICE DAILY 04/05/17  Yes Ennever, Rudell Cobb, MD  furosemide (LASIX) 40 MG tablet Take 1-1.5 tablets (40-60 mg total) by mouth daily as directed. 09/08/16  Yes Croitoru, Mihai, MD  hydrALAZINE (APRESOLINE) 10 MG tablet TAKE 1 TABLET (10 MG) BY MOUTH THREE TIMES DAILY 12/06/17  Yes Croitoru, Mihai, MD  Insulin Glargine (LANTUS SOLOSTAR) 100 UNIT/ML Solostar Pen Inject 15 Units into the skin as needed. Per sliding scale.    Yes [provider]  insulin lispro (HUMALOG KWIKPEN)  100 UNIT/ML KiwkPen Inject 10 Units into the skin as needed.    Yes [provider]  isosorbide mononitrate (IMDUR) 30 MG 24 hr tablet TAKE 1 TABLET (30 MG) BY MOUTH DAILY 10/17/17  Yes Croitoru, Mihai, MD  rosuvastatin (CRESTOR) 10 MG tablet TAKE 1 TABLET  DAILY. 05/15/17  Yes Croitoru, Mihai, MD  TRUE METRIX BLOOD GLUCOSE TEST test strip  02/29/16  Yes [provider]  azithromycin (ZITHROMAX) 250 MG tablet Take 1 tablet (250 mg total) by mouth daily. Take first 2 tablets together, then 1 every day until finished. 09/22/17   Deno Etienne, DO  benzonatate (TESSALON) 100 MG capsule Take 1 capsule (100 mg total) by mouth every 8 (eight) hours. 09/22/17   Deno Etienne, DO  fluticasone (FLONASE) 50 MCG/ACT nasal spray Place 2 sprays into the nose daily. 12/16/10 08/03/11  Rowe Clack, MD    Family  History Family History  Problem Relation Age of Onset  . Parkinson's disease Mother   . Asthma Father   . Diabetes Sister   . Colon cancer Neg Hx   . Stomach cancer Neg Hx   . Esophageal cancer Neg Hx   . Rectal cancer Neg Hx   . Liver cancer Neg Hx     Social History Social History   Tobacco Use  . Smoking status: Never Smoker  . Smokeless tobacco: Never Used  . Tobacco comment: Lives alone-divorced  Substance Use Topics  . Alcohol use: No    Alcohol/week: 0.0 oz  . Drug use: No     Allergies   Patient has no known allergies.   Review of Systems Review of Systems  Constitutional: Positive for unexpected weight change. Negative for chills, diaphoresis and fever.  HENT: Negative for rhinorrhea and sore throat.   Respiratory: Positive for cough (dry, chronic, unchanged), shortness of breath and wheezing (chronic and unchanged).        +orthopnea (4 pillows up from 2)  Cardiovascular: Positive for chest pain (intermittent) and leg swelling.  Gastrointestinal: Negative for abdominal pain, constipation, diarrhea, nausea and vomiting.  Genitourinary: Negative for  dysuria and hematuria.  Musculoskeletal: Negative for arthralgias and myalgias.  Skin: Negative for color change.  Allergic/Immunologic: Positive for immunocompromised state (DM2).  Neurological: Negative for weakness, light-headedness and numbness.  Hematological: Bruises/bleeds easily (on eliquis).  Psychiatric/Behavioral: Negative for confusion.   All other systems reviewed and are negative for acute change except as noted in the HPI.    Physical Exam Updated Vital Signs BP 119/73   Pulse 77   Temp 98.5 F (36.9 C) (Oral)   Resp 17   Ht 5\' 1"  (1.549 m)   Wt 93.4 kg (206 lb)   SpO2 98%   BMI 38.92 kg/m    Physical Exam  Constitutional: She is oriented to person, place, and time. Vital signs are normal. She appears well-developed and well-nourished.  Non-toxic appearance. No distress.  Afebrile, nontoxic, NAD  HENT:  Head: Normocephalic and atraumatic.  Mouth/Throat: Oropharynx is clear and moist and mucous membranes are normal.  Eyes: Conjunctivae and EOM are normal. Right eye exhibits no discharge. Left eye exhibits no discharge.  Neck: Normal range of motion. Neck supple. JVD present.  Very slight JVD noted  Cardiovascular: Normal rate, regular rhythm, normal heart sounds and intact distal pulses. Exam reveals no gallop and no friction rub.  No murmur heard. RRR, nl s1/s2, no m/r/g, distal pulses intact, 2+ b/l pedal edema   Pulmonary/Chest: Effort normal. No respiratory distress. She has decreased breath sounds. She has no wheezes. She has no rhonchi. She has no rales. She exhibits no tenderness, no crepitus, no deformity and no retraction.  Globally slightly diminished lung sounds throughout therefore difficult to appreciate lung sounds, however no appreciable wheezing/rhonchi/rales, no hypoxia or increased WOB, speaking in full sentences, SpO2 98% on RA Chest wall nonTTP without crepitus, deformities, or retractions   Abdominal: Soft. Normal appearance and bowel sounds  are normal. She exhibits no distension. There is no tenderness. There is no rigidity, no rebound, no guarding, no CVA tenderness, no tenderness at McBurney's point and negative Murphy's sign.  Musculoskeletal: Normal range of motion.  MAE x4 Strength and sensation grossly intact in all extremities Distal pulses intact 2+ b/l pedal edema, neg homan's bilaterally   Neurological: She is alert and oriented to person, place, and time. She has normal strength. No sensory deficit.  Skin: Skin  is warm, dry and intact. No rash noted.  Psychiatric: She has a normal mood and affect.  Nursing note and vitals reviewed.  Wt Readings from Last 3 Encounters:  12/10/17 94.3 kg (208 lb)  09/22/17 90.7 kg (200 lb)  07/20/17 91.9 kg (202 lb 9.6 oz)     ED Treatments / Results  Labs (all labs ordered are listed, but only abnormal results are displayed) Labs Reviewed  BASIC METABOLIC PANEL - Abnormal; Notable for the following components:      Result Value   Glucose, Bld 106 (*)    BUN 37 (*)    Creatinine, Ser 2.36 (*)    GFR calc non Af Amer 20 (*)    GFR calc Af Amer 23 (*)    All other components within normal limits  CBC WITH DIFFERENTIAL/PLATELET - Abnormal; Notable for the following components:   RBC 3.37 (*)    Hemoglobin 10.3 (*)    HCT 32.5 (*)    All other components within normal limits  BRAIN NATRIURETIC PEPTIDE - Abnormal; Notable for the following components:   B Natriuretic Peptide 1,143.1 (*)    All other components within normal limits  TROPONIN I - Abnormal; Notable for the following components:   Troponin I 0.03 (*)    All other components within normal limits  TROPONIN I - Abnormal; Notable for the following components:   Troponin I 0.04 (*)    All other components within normal limits    EKG EKG Interpretation  Date/Time:  Sunday December 10 2017 16:08:18 EDT Ventricular Rate:  83 PR Interval:    QRS Duration: 98 QT Interval:  402 QTC Calculation: 473 R  Axis:   -29 Text Interpretation:  Sinus rhythm Borderline left axis deviation Borderline T wave abnormalities Baseline wander in lead(s) I aVL Confirmed by Virgel Manifold [phone]) on 12/10/2017 4:35:14 PM   Radiology Dg Chest 2 View  Result Date: 12/10/2017 CLINICAL DATA:  Shortness of breath EXAM: CHEST - 2 VIEW COMPARISON:  09/22/2017 FINDINGS: Mild cardiomegaly and vascular congestion. Stable mild chronic interstitial prominence. No effusions or acute confluent opacities. No acute bony abnormality. IMPRESSION: Cardiomegaly, vascular congestion. Stable chronic interstitial prominence. Electronically Signed   By: Rolm Baptise M.D.   On: 12/10/2017 17:18     Echo 08/31/15: Study Conclusions - Left ventricle: Diffuse hypokinesis possibly worse in inferior   wall but   poor endocardial definition consider f/u optison or MRI. The   cavity size was moderately dilated. Wall thickness was normal.   The estimated ejection fraction was 45%. Left ventricular   diastolic function parameters were normal. - Atrial septum: No defect or patent foramen ovale was identified.    Heart Cath 10/31/15: Conclusion  1. No angiographic evidence of CAD 2. Elevated filling pressures  Recommendations: Will not need further ischemic evaluation. She has elevated filling pressures following pre-cath hydration. Would continue Lasix. May need to adjust dose based on renal function. I will hydrate today post cath. Restart heparin drip 8 hours post sheath pull. Will continue heparin tonight. If no groin access site bleeding in am, can restart Eliquis tomorrow. Would plan d/c home tomorrow if stable.       Procedures Procedures (including critical care time)  Medications Ordered in ED Medications  furosemide (LASIX) injection 40 mg (40 mg Intravenous Given 12/10/17 1713)     Initial Impression / Assessment and Plan / ED Course  I have reviewed the triage vital signs and the nursing notes.  Pertinent labs &  imaging results that were available during my care of the patient were reviewed by me and considered in my medical decision making (see chart for details).     67 y.o. female here with SOB and weight gain/swelling x2 days. Tried doubling her lasix last night but didn't get a response. Having 4 pillow orthopnea. States it feels like her prior CHF exacerbations, but states in the past she gets IV lasix and usually has good response and goes home. Chart review reveals that in 07/2016 she had a visit for CHF exacerbation and received 40mg  IV lasix, improved, and went home. On exam, globally diminished lung sounds throughout, no definite wheezing/rales appreciated however diminished sounds makes it hard to appreciate this fully; no hypoxia or increased WOB at rest however nursing reported that she had more WOB with walking in from her car. No tachycardia. 2+ b/l pedal edema. Slight JVD noted. Neg homan's bilaterally. Overall, pt's symptoms and exam most c/w CHF exacerbation, doubt DVT/PE particularly since she is compliant on her eliquis. EKG without acute ischemic findings and overall unchanged from prior. Will get labs and CXR, start with 40mg  IV lasix (since she also has CKD4), and then reassess at that time. Discussed case with my attending Dr. Wilson Singer who agrees with plan.   5:33 PM CBC w/diff with mild fairly stable anemia (Hgb 10.3/Hct 32.5, similar to prior trends over the years). BMP with stable kidney function Cr 2.36/BUN 37. BNP 1143.1 confirming suspected CHF. Weight here is 208lbs (up from 202lbs 9.6oz on 07/20/17 which was her last cardiology visit). CXR showing mild cardiomegaly and vascular congestion, with stable mild chronic interstitial prominence. Trop 0.03 which is at the cut off (<0.03 is the cutoff), and chart review reveals that her prior troponin I's have been this same value on multiple occasions, this is likely from troponin leak from CHF/mild demand ischemia from CHF exacerbation; pt  without any c/o CP at this time, doubt need for ASA or acute cardiology consult; will repeat troponin at 3hr mark to ensure no elevation. Discussed case with my attending Dr. Wilson Singer who agrees with plan. Pt just now receiving lasix, will await to see if she has adequate response. Will reassess shortly.    9:55 PM Second trop 0.04 (essentially unchanged from prior) at 3.5hrs after first one; pt continues to deny any CP at this time. Heart cath in 2017 without ischemic cardiac disease. Doubt need for further emergent work up or consultation at this time, this is likely from demand ischemia/leak from CHF. Pt feeling much better and having adequate response to lasix, has had good UOP here. Ambulatory without desats or recurrent symptoms, feels well and wanting to go home. Advised increasing lasix to 40mg  BID x3 days starting tomorrow morning, DASH diet and fluid restriction, elevation of legs and compression stocking use, and f/up with cardiologist in 2-3 days for recheck of symptoms and ongoing management of her CHF. Strict return precautions advised. Discussed case with my attending Dr. Wilson Singer who agrees with plan. I explained the diagnosis and have given explicit precautions to return to the ER including for any other new or worsening symptoms. The patient understands and accepts the medical plan as it's been dictated and I have answered their questions. Discharge instructions concerning home care and prescriptions have been given. The patient is STABLE and is discharged to home in good condition.    Final Clinical Impressions(s) / ED Diagnoses   Final diagnoses:  Acute on chronic combined systolic and diastolic congestive heart  failure (HCC)  SOB (shortness of breath) on exertion  Intermittent left-sided chest pain  Chronic kidney disease, unspecified CKD stage  Chronic anemia  Elevated troponin I level  Peripheral edema    ED Discharge Orders    [address], [place], [place] 12/10/17  2158    Virgel Manifold, MD 12/10/17 2308

## 2017-12-10 NOTE — ED Notes (Signed)
Patient transported to X-ray 

## 2017-12-10 NOTE — ED Notes (Signed)
ED Provider at bedside. 

## 2017-12-10 NOTE — ED Notes (Signed)
Date and time results received: 12/10/17 1725  Test: trp Critical Value: 0.03 Name of Provider Notified: Wilson Singer Orders Received? Or Actions Taken?: no orders given

## 2017-12-18 ENCOUNTER — Other Ambulatory Visit: Payer: Self-pay | Admitting: Cardiovascular Disease

## 2018-01-05 ENCOUNTER — Inpatient Hospital Stay: Payer: Medicare HMO | Attending: Hematology & Oncology

## 2018-01-05 ENCOUNTER — Ambulatory Visit (HOSPITAL_BASED_OUTPATIENT_CLINIC_OR_DEPARTMENT_OTHER)
Admission: RE | Admit: 2018-01-05 | Discharge: 2018-01-05 | Disposition: A | Discharge status: Home / Self Care | Payer: Medicare HMO | Source: Ambulatory Visit | Attending: Family | Admitting: Family

## 2018-01-05 ENCOUNTER — Other Ambulatory Visit: Payer: Self-pay

## 2018-01-05 ENCOUNTER — Inpatient Hospital Stay (HOSPITAL_BASED_OUTPATIENT_CLINIC_OR_DEPARTMENT_OTHER): Payer: Medicare HMO | Admitting: Family

## 2018-01-05 ENCOUNTER — Other Ambulatory Visit: Payer: Self-pay | Admitting: Family

## 2018-01-05 VITALS — BP 123/66 | HR 83 | Temp 98.3°F | Resp 16 | Wt 194.0 lb

## 2018-01-05 DIAGNOSIS — L988 Other specified disorders of the skin and subcutaneous tissue: Secondary | ICD-10-CM | POA: Diagnosis not present

## 2018-01-05 DIAGNOSIS — Z9011 Acquired absence of right breast and nipple: Secondary | ICD-10-CM

## 2018-01-05 DIAGNOSIS — Z7901 Long term (current) use of anticoagulants: Secondary | ICD-10-CM | POA: Diagnosis not present

## 2018-01-05 DIAGNOSIS — R918 Other nonspecific abnormal finding of lung field: Secondary | ICD-10-CM | POA: Insufficient documentation

## 2018-01-05 DIAGNOSIS — Z86718 Personal history of other venous thrombosis and embolism: Secondary | ICD-10-CM | POA: Diagnosis not present

## 2018-01-05 DIAGNOSIS — J40 Bronchitis, not specified as acute or chronic: Secondary | ICD-10-CM

## 2018-01-05 DIAGNOSIS — R05 Cough: Secondary | ICD-10-CM | POA: Insufficient documentation

## 2018-01-05 DIAGNOSIS — Z853 Personal history of malignant neoplasm of breast: Secondary | ICD-10-CM

## 2018-01-05 DIAGNOSIS — D6851 Activated protein C resistance: Secondary | ICD-10-CM | POA: Insufficient documentation

## 2018-01-05 DIAGNOSIS — R058 Other specified cough: Secondary | ICD-10-CM

## 2018-01-05 DIAGNOSIS — Z794 Long term (current) use of insulin: Secondary | ICD-10-CM | POA: Insufficient documentation

## 2018-01-05 DIAGNOSIS — I517 Cardiomegaly: Secondary | ICD-10-CM | POA: Diagnosis not present

## 2018-01-05 DIAGNOSIS — I82401 Acute embolism and thrombosis of unspecified deep veins of right lower extremity: Secondary | ICD-10-CM

## 2018-01-05 DIAGNOSIS — Z79899 Other long term (current) drug therapy: Secondary | ICD-10-CM | POA: Diagnosis not present

## 2018-01-05 DIAGNOSIS — C50911 Malignant neoplasm of unspecified site of right female breast: Secondary | ICD-10-CM | POA: Diagnosis not present

## 2018-01-05 LAB — CBC WITH DIFFERENTIAL (CANCER CENTER ONLY)
Basophils Absolute: 0 10*3/uL (ref 0.0–0.1)
Basophils Relative: 1 %
EOS ABS: 0.1 10*3/uL (ref 0.0–0.5)
Eosinophils Relative: 3 %
HCT: 34.2 % — ABNORMAL LOW (ref 34.8–46.6)
HEMOGLOBIN: 10.5 g/dL — AB (ref 11.6–15.9)
LYMPHS ABS: 1.3 10*3/uL (ref 0.9–3.3)
Lymphocytes Relative: 33 %
MCH: 29.7 pg (ref 26.0–34.0)
MCHC: 30.7 g/dL — AB (ref 32.0–36.0)
MCV: 96.6 fL (ref 81.0–101.0)
MONOS PCT: 9 %
Monocytes Absolute: 0.3 10*3/uL (ref 0.1–0.9)
Neutro Abs: 2.1 10*3/uL (ref 1.5–6.5)
Neutrophils Relative %: 54 %
Platelet Count: 225 10*3/uL (ref 145–400)
RBC: 3.54 MIL/uL — ABNORMAL LOW (ref 3.70–5.32)
RDW: 15.6 % (ref 11.1–15.7)
WBC Count: 3.8 10*3/uL — ABNORMAL LOW (ref 3.9–10.0)

## 2018-01-05 LAB — CMP (CANCER CENTER ONLY)
ALBUMIN: 3.4 g/dL — AB (ref 3.5–5.0)
ALT: 12 U/L (ref 0–44)
AST: 22 U/L (ref 15–41)
Alkaline Phosphatase: 96 U/L (ref 38–126)
Anion gap: 8 (ref 5–15)
BUN: 28 mg/dL — AB (ref 8–23)
CHLORIDE: 107 mmol/L (ref 98–111)
CO2: 28 mmol/L (ref 22–32)
CREATININE: 2.07 mg/dL — AB (ref 0.44–1.00)
Calcium: 10.1 mg/dL (ref 8.9–10.3)
GFR, Est AFR Am: 27 mL/min — ABNORMAL LOW (ref >60)
GFR, Estimated: 23 mL/min — ABNORMAL LOW (ref >60)
Glucose, Bld: 101 mg/dL — ABNORMAL HIGH (ref 70–99)
Potassium: 4.5 mmol/L (ref 3.5–5.1)
Sodium: 143 mmol/L (ref 135–145)
Total Bilirubin: 0.6 mg/dL (ref 0.3–1.2)
Total Protein: 7.2 g/dL (ref 6.5–8.1)

## 2018-01-05 MED ORDER — AZITHROMYCIN 250 MG PO TABS: ORAL_TABLET | ORAL | 0 refills | Status: DC

## 2018-01-05 NOTE — Addendum Note (Signed)
Addended by: Melton Krebs on: 01/05/2018 11:30 AM   Modules accepted: Orders

## 2018-01-05 NOTE — Progress Notes (Signed)
Chest xray showed evidence of bronchitis. Dr. Marin Olp reviewed. Spoke with patient and she will start Z-pack today.

## 2018-01-05 NOTE — Progress Notes (Signed)
Hematology and Oncology Follow Up Visit  Patricia Morgan [id number] 1950/12/06 67 y.o. 01/05/2018   Principle Diagnosis:  Recurrent Thromboembolic disease Factor V Leiden - Heterozygote H/O locally recurrent ductal carcinoma - RIGHT breast  Current Therapy:   Eliquis 5 mg po BID   Interim History:  Patricia Morgan is here today for follow-up. She has had 2 episodes (one in May and one 3 weeks ago) with fever (as high as 103), n/v, diarrhea, cough with clear phlegm and sweats. She did not go to the ED because she was afraid with the diarrhea she would not have a bathroom close by.  She is concerned with two sores that have appeared along her right mastectomy incision line. She states that her mastectomy was in 1994 and her cancer was stage II. She just noticed them yesterday. One has a nickel sized white scab and then a dime sized open red area. No odor or drainage.  Right chest, left breast and bilateral axillary exam today was otherwise negative.  No lymphadenopathy noted on her exam.  She continues to do well on Eliquis 5 mg PO BID and has had no episodes of bleeding, no bruising or petechiae.  No chills, rash, dizziness, SOB, chest pain, palpitations, abdominal pain or changes in bladder habits.  No swelling, tenderness, numbness or tingling in her extremities. No c/o pain.  Her appetite is good now and she is staying hydrated. Her weight is stable.   ECOG Performance Status: 1 - Symptomatic but completely ambulatory  Medications:  Allergies as of 01/05/2018   No Known Allergies     Medication List        Accurate as of 01/05/18  9:58 AM. Always use your most recent med list.          acetaminophen 500 MG tablet Commonly known as:  TYLENOL Take 500 mg by mouth every 6 (six) hours as needed for mild pain.   allopurinol 100 MG tablet Commonly known as:  ZYLOPRIM Take 100 mg by mouth 2 (two) times daily.   azithromycin 250 MG tablet Commonly known as:  ZITHROMAX Take 1  tablet (250 mg total) by mouth daily. Take first 2 tablets together, then 1 every day until finished.   benzonatate 100 MG capsule Commonly known as:  TESSALON Take 1 capsule (100 mg total) by mouth every 8 (eight) hours.   carvedilol 25 MG tablet Commonly known as:  COREG TAKE 1 TABLET (25 MG TOTAL) BY MOUTH 2 (TWO) TIMES DAILY WITH MEALS   colchicine 0.6 MG tablet Take 0.6 mg by mouth daily.   ELIQUIS 5 MG Tabs tablet Generic drug:  apixaban TAKE 1 TABLET TWICE DAILY   furosemide 40 MG tablet Commonly known as:  LASIX Take 1-1.5 tablets (40-60 mg total) by mouth daily as directed.   HUMALOG KWIKPEN 100 UNIT/ML KiwkPen Generic drug:  insulin lispro Inject 10 Units into the skin as needed.   hydrALAZINE 10 MG tablet Commonly known as:  APRESOLINE TAKE 1 TABLET (10 MG) BY MOUTH THREE TIMES DAILY   isosorbide mononitrate 30 MG 24 hr tablet Commonly known as:  IMDUR TAKE 1 TABLET (30 MG) BY MOUTH DAILY   LANTUS SOLOSTAR 100 UNIT/ML Solostar Pen Generic drug:  Insulin Glargine Inject 15 Units into the skin as needed. Per sliding scale.   rosuvastatin 10 MG tablet Commonly known as:  CRESTOR TAKE 1 TABLET EVERY DAY   TRUE METRIX BLOOD GLUCOSE TEST test strip Generic drug:  glucose blood  Allergies: No Known Allergies  Past Medical History, Surgical history, Social history, and Family History were reviewed and updated.  Review of Systems: All other 10 point review of systems is negative.   Physical Exam:  vitals were not taken for this visit.   Wt Readings from Last 3 Encounters:  12/10/17 208 lb (94.3 kg)  09/22/17 200 lb (90.7 kg)  07/20/17 202 lb 9.6 oz (91.9 kg)    Ocular: Sclerae unicteric, pupils equal, round and reactive to light Ear-nose-throat: Oropharynx clear, dentition fair Lymphatic: No cervical, supraclavicular or axillary adenopathy Lungs no rales or rhonchi, good excursion bilaterally Heart regular rate and rhythm, no murmur  appreciated Abd soft, nontender, positive bowel sounds, no liver or spleen tip palpated on exam, no fluid wave  MSK no focal spinal tenderness, no joint edema Neuro: non-focal, well-oriented, appropriate affect Breasts: 2 healing lesions noted along right mastectomy incision line, no mass or rash noted, left breast exam negative.    Lab Results  Component Value Date   WBC 3.8 (L) 01/05/2018   HGB 10.5 (L) 01/05/2018   HCT 34.2 (L) 01/05/2018   MCV 96.6 01/05/2018   PLT 225 01/05/2018   Lab Results  Component Value Date   FERRITIN 163 07/16/2012   IRON 83 07/16/2012   TIBC 379 07/16/2012   UIBC 296 07/16/2012   IRONPCTSAT 22 07/16/2012   Lab Results  Component Value Date   RETICCTPCT 2.1 07/16/2012   RBC 3.54 (L) 01/05/2018   RETICCTABS 83.6 07/16/2012   No results found for: KPAFRELGTCHN, LAMBDASER, KAPLAMBRATIO No results found for: Kandis Cocking, IGMSERUM No results found for: Odetta Pink, SPEI   Chemistry      Component Value Date/Time   NA 140 12/10/2017 1638   NA 140 01/23/2017 0906   NA 141 12/07/2016 1300   NA 143 03/18/2016 0944   K 4.4 12/10/2017 1638   K 4.7 12/07/2016 1300   K 4.9 03/18/2016 0944   CL 109 12/10/2017 1638   CL 103 12/07/2016 1300   CO2 22 12/10/2017 1638   CO2 29 12/07/2016 1300   CO2 27 03/18/2016 0944   BUN 37 (H) 12/10/2017 1638   BUN 33 (H) 01/23/2017 0906   BUN 45 (H) 12/07/2016 1300   BUN 29.0 (H) 03/18/2016 0944   CREATININE 2.36 (H) 12/10/2017 1638   CREATININE 2.6 (H) 12/07/2016 1300   CREATININE 2.1 (H) 03/18/2016 0944      Component Value Date/Time   CALCIUM 8.9 12/10/2017 1638   CALCIUM 9.9 12/07/2016 1300   CALCIUM 9.3 03/18/2016 0944   ALKPHOS 127 (H) 12/07/2016 1300   ALKPHOS 147 03/18/2016 0944   AST 35 12/07/2016 1300   AST 22 03/18/2016 0944   ALT 29 12/07/2016 1300   ALT 19 03/18/2016 0944   BILITOT 0.70 12/07/2016 1300   BILITOT 0.32 03/18/2016 0944       Impression and Plan: Patricia Morgan is a very pleasant 67 yo African American female with history of recurrent thromboembolic disease on lifelong anticoagulation with Eliquis. She has done well with this and has no complaints.  She does have history of recurrent right breast cancer with mastectomy in 1994. She now has two healing sores along the incision line she is concerned with.  We will get an [place] and chest xray today to further assess.  We will go ahead and plan to see her back in another 6 months for follow-up and will adjust this if needed depending  on her scan findings.  She will contact our office with any questions or concerns. We can certainly see her sooner if need be.   Laverna Peace, NP 8/23/20199:58 AM

## 2018-01-12 ENCOUNTER — Other Ambulatory Visit: Payer: Medicare HMO

## 2018-01-17 ENCOUNTER — Ambulatory Visit
Admission: RE | Admit: 2018-01-17 | Discharge: 2018-01-17 | Disposition: A | Discharge status: Home / Self Care | Payer: Medicare HMO | Source: Ambulatory Visit | Attending: Family | Admitting: Family

## 2018-01-17 DIAGNOSIS — I129 Hypertensive chronic kidney disease with stage 1 through stage 4 chronic kidney disease, or unspecified chronic kidney disease: Secondary | ICD-10-CM | POA: Diagnosis not present

## 2018-01-17 DIAGNOSIS — M109 Gout, unspecified: Secondary | ICD-10-CM | POA: Diagnosis not present

## 2018-01-17 DIAGNOSIS — R058 Other specified cough: Secondary | ICD-10-CM

## 2018-01-17 DIAGNOSIS — Z853 Personal history of malignant neoplasm of breast: Secondary | ICD-10-CM

## 2018-01-17 DIAGNOSIS — R05 Cough: Secondary | ICD-10-CM

## 2018-01-17 DIAGNOSIS — N183 Chronic kidney disease, stage 3 (moderate): Secondary | ICD-10-CM | POA: Diagnosis not present

## 2018-01-17 DIAGNOSIS — E1122 Type 2 diabetes mellitus with diabetic chronic kidney disease: Secondary | ICD-10-CM | POA: Diagnosis not present

## 2018-01-17 DIAGNOSIS — Z6838 Body mass index (BMI) 38.0-38.9, adult: Secondary | ICD-10-CM | POA: Diagnosis not present

## 2018-01-17 DIAGNOSIS — N6489 Other specified disorders of breast: Secondary | ICD-10-CM | POA: Diagnosis not present

## 2018-01-22 DIAGNOSIS — R05 Cough: Secondary | ICD-10-CM | POA: Diagnosis not present

## 2018-01-22 DIAGNOSIS — N6489 Other specified disorders of breast: Secondary | ICD-10-CM | POA: Diagnosis not present

## 2018-02-05 ENCOUNTER — Telehealth: Payer: Self-pay | Admitting: Cardiovascular Disease

## 2018-02-05 NOTE — Telephone Encounter (Signed)
Thank you :)

## 2018-02-05 NOTE — Telephone Encounter (Signed)
Spoke with pt pt sts that her weight has been increasing over the last several 3 weeks by 8lbs.  Pt is having increased exertional sob and orthopnea (sleeping on 3 pillows). She is also having increased LE swelling.  Her base line weight is around 194lb. This morning her weight is 202lb She has taken an increased dose of Lasix 60mg  yesterday and the day before. She has not taken any Lasix today Adv pt that I will talk with Dr.C and call her back with his instruction.  Per Dr.C she is to take 80mg  of Lasix this morning and 40mg  this afternoon. She is to keep her appt with Jory Sims, D-NP tomorrow and should have labs drawn at that appt. Pt verbalized understanding to the instruction given.

## 2018-02-06 ENCOUNTER — Ambulatory Visit (INDEPENDENT_AMBULATORY_CARE_PROVIDER_SITE_OTHER): Payer: Medicare HMO | Admitting: Adult Health

## 2018-02-06 ENCOUNTER — Encounter: Payer: Self-pay | Admitting: Adult Health

## 2018-02-06 VITALS — BP 112/70 | HR 68 | Ht 61.0 in | Wt 203.6 lb

## 2018-02-06 DIAGNOSIS — D6851 Activated protein C resistance: Secondary | ICD-10-CM | POA: Diagnosis not present

## 2018-02-06 DIAGNOSIS — M109 Gout, unspecified: Secondary | ICD-10-CM | POA: Diagnosis not present

## 2018-02-06 DIAGNOSIS — R0600 Dyspnea, unspecified: Secondary | ICD-10-CM | POA: Diagnosis not present

## 2018-02-06 DIAGNOSIS — I129 Hypertensive chronic kidney disease with stage 1 through stage 4 chronic kidney disease, or unspecified chronic kidney disease: Secondary | ICD-10-CM | POA: Diagnosis not present

## 2018-02-06 DIAGNOSIS — I519 Heart disease, unspecified: Secondary | ICD-10-CM | POA: Diagnosis not present

## 2018-02-06 DIAGNOSIS — Z23 Encounter for immunization: Secondary | ICD-10-CM | POA: Diagnosis not present

## 2018-02-06 DIAGNOSIS — N184 Chronic kidney disease, stage 4 (severe): Secondary | ICD-10-CM | POA: Diagnosis not present

## 2018-02-06 DIAGNOSIS — E1122 Type 2 diabetes mellitus with diabetic chronic kidney disease: Secondary | ICD-10-CM | POA: Diagnosis not present

## 2018-02-06 DIAGNOSIS — I10 Essential (primary) hypertension: Secondary | ICD-10-CM | POA: Diagnosis not present

## 2018-02-06 DIAGNOSIS — I5042 Chronic combined systolic (congestive) and diastolic (congestive) heart failure: Secondary | ICD-10-CM

## 2018-02-06 DIAGNOSIS — Z79899 Other long term (current) drug therapy: Secondary | ICD-10-CM

## 2018-02-06 DIAGNOSIS — D649 Anemia, unspecified: Secondary | ICD-10-CM

## 2018-02-06 DIAGNOSIS — I428 Other cardiomyopathies: Secondary | ICD-10-CM | POA: Diagnosis not present

## 2018-02-06 DIAGNOSIS — M85851 Other specified disorders of bone density and structure, right thigh: Secondary | ICD-10-CM | POA: Diagnosis not present

## 2018-02-06 DIAGNOSIS — E7800 Pure hypercholesterolemia, unspecified: Secondary | ICD-10-CM | POA: Diagnosis not present

## 2018-02-06 DIAGNOSIS — G4730 Sleep apnea, unspecified: Secondary | ICD-10-CM | POA: Diagnosis not present

## 2018-02-06 NOTE — Progress Notes (Signed)
Cardiology Office Note   Date:  02/06/2018   ID:  Patricia Morgan, DOB 14-Jul-1950, MRN [id number]  PCP:  Antony Contras, MD  Cardiologist: Croitoru   Chief Complaint  Patient presents with  . Shortness of Breath    when active  . Edema    swelling in legs and feet      Patricia Morgan is a 67 y.o. female who presents for ongoing assessment and management of CHF. Hypertension, NICM (suspected Adriamycin-related) Hyperlipidemia, with other hx to include CKD Stage III, recurrent venous thromboembolic events (factor V Leiden), with long standing hypertension.    She has had complaints of fluid retention and worsening dyspnea. She called our office and was instructed to increase lasix to 80 mg and 40 mg daily. She has has some improvement in fluid but breathing status is not better.   She admits to eating out a lot and therefore has not limited her salt. She has not been active due to her breathing status. Weight is up a 3-4 lbs.   Past Medical History:  Diagnosis Date  . ANEMIA-NOS   . BREAST CANCER, HX OF 1994 & 1995   1994>>recurrence in 1995, R breast, s/p lumpectomy>>mastectomy  . Cancer ([place])   . CHF (congestive heart failure) ([place])   . CHRONIC KIDNEY DISEASE STAGE III (MODERATE)   . Clotting disorder ([place])    on eliquis for Factor 5 disorder  . Colon polyps   . DIABETES MELLITUS, TYPE II   . Diverticulosis   . HYPERLIPIDEMIA   . HYPERTENSION   . OBSTRUCTIVE SLEEP APNEA    CPAP  . Personal history of chemotherapy   . Proteinuria   . PULMONARY EMBOLISM 09/24/2008   anticoag thru 03/2010  . Sleep apnea    Wears CPAP nightly    Past Surgical History:  Procedure Laterality Date  . BREAST SURGERY    . CARDIAC CATHETERIZATION N/A 11/02/2015   Procedure: Right/Left Heart Cath and Coronary Angiography;  Surgeon: Burnell Blanks, MD;  Location: [facility];  Service: Cardiovascular;  Laterality: N/A;  . insertion port a cath    . MASTECTOMY Right 1995   Right   . PORT-A-CATH REMOVAL       Current Outpatient Medications  Medication Sig Dispense Refill  . acetaminophen (TYLENOL) 500 MG tablet Take 500 mg by mouth every 6 (six) hours as needed for mild pain.    [name] allopurinol (ZYLOPRIM) 100 MG tablet Take 100 mg by mouth 2 (two) times daily.     . carvedilol (COREG) 25 MG tablet TAKE 1 TABLET (25 MG TOTAL) BY MOUTH 2 (TWO) TIMES DAILY WITH MEALS 180 tablet 3  . colchicine 0.6 MG tablet Take 0.6 mg by mouth daily.   1  . ELIQUIS 5 MG TABS tablet TAKE 1 TABLET TWICE DAILY 180 tablet 6  . furosemide (LASIX) 40 MG tablet Take 1-1.5 tablets (40-60 mg total) by mouth daily as directed. 135 tablet 2  . hydrALAZINE (APRESOLINE) 10 MG tablet TAKE 1 TABLET (10 MG) BY MOUTH THREE TIMES DAILY 270 tablet 3  . Insulin Glargine (LANTUS SOLOSTAR) 100 UNIT/ML Solostar Pen Inject 15 Units into the skin as needed. Per sliding scale.     . insulin lispro (HUMALOG KWIKPEN) 100 UNIT/ML KiwkPen Inject 10 Units into the skin as needed.     . isosorbide mononitrate (IMDUR) 30 MG 24 hr tablet TAKE 1 TABLET (30 MG) BY MOUTH DAILY 90 tablet 3  . rosuvastatin (CRESTOR) 10 MG tablet TAKE  1 TABLET EVERY DAY 90 tablet 3  . TRUE METRIX BLOOD GLUCOSE TEST test strip     . azithromycin (ZITHROMAX Z-PAK) 250 MG tablet Take as directed on package. (Patient not taking: Reported on 02/06/2018) 6 each 0   Current Facility-Administered Medications  Medication Dose Route Frequency Provider Last Rate Last Dose  . 0.9 %  sodium chloride infusion  500 mL Intravenous Continuous Irene Shipper, MD        Allergies:   Patient has no known allergies.    Social History:  The patient  reports that she has never smoked. She has never used smokeless tobacco. She reports that she does not drink alcohol or use drugs.   Family History:  The patient's family history includes Asthma in her father; Diabetes in her sister; Parkinson's disease in her mother.    ROS: All other systems are reviewed and  negative. Unless otherwise mentioned in H&P    PHYSICAL EXAM: VS:  BP 112/70   Pulse 68   Ht 5\' 1"  (1.549 m)   Wt 203 lb 9.6 oz (92.4 kg)   BMI 38.47 kg/m  , BMI Body mass index is 38.47 kg/m. GEN: Well nourished, well developed, in no acute distress  HEENT: normal  Neck: no JVD, carotid bruits, or masses Cardiac: RRR; no murmurs, rubs, or gallops, 1+  Dependent edema in the pre-tibial area and 2+ in ankles.  Respiratory:  clear to auscultation bilaterally, normal work of breathing GI: soft, nontender, nondistended, + BS MS: no deformity or atrophy  Skin: warm and dry, no rash Neuro:  Strength and sensation are intact Psych: euthymic mood, full affect   EKG:  Not competed this office visit  Recent Labs: 12/10/2017: B Natriuretic Peptide 1,143.1 01/05/2018: ALT 12; BUN 28; Creatinine 2.07; Hemoglobin 10.5; Platelet Count 225; Potassium 4.5; Sodium 143    Lipid Panel    Component Value Date/Time   CHOL 184 09/13/2012 1357   TRIG 142 09/13/2012 1357   HDL 45 09/13/2012 1357   CHOLHDL 4.1 09/13/2012 1357   VLDL 28 09/13/2012 1357   LDLCALC 111 (H) 09/13/2012 1357   LDLDIRECT 141.3 10/14/2008 1004      Wt Readings from Last 3 Encounters:  02/06/18 203 lb 9.6 oz (92.4 kg)  01/05/18 194 lb (88 kg)  12/10/17 208 lb (94.3 kg)      Other studies Reviewed:  Echocardiogram 08/31/2015  Left ventricle: Diffuse hypokinesis possibly worse in inferior   wall but   poor endocardial definition consider f/u optison or MRI. The   cavity size was moderately dilated. Wall thickness was normal.   The estimated ejection fraction was 45%. Left ventricular   diastolic function parameters were normal. - Atrial septum: No defect or patent foramen ovale was identified.   ASSESSMENT AND PLAN:  1.  Worsening Dyspnea: I will recheck echo for changes in her LV function, CBC in the setting of anemia with CKD and use of Eliquis. She will also have PFT's competed.   2. Chronic Systolic CHF:  Reduced LV fx in 2017, will recheck echo. She is advised on low sodium diet.   3. Hypertension: BP is well controlled, low normal. Will follow.   4. CKD Stage III; She will have BMET drawn.   Current medicines are reviewed at length with the patient today.    Labs/ tests ordered today include: BMET, CBC,Echo Phill Myron. West Pugh, ANP, AACC   02/06/2018 11:31 AM    [facility]  [address], [place], [place] [address] Phone: [phone]; Fax: [phone]

## 2018-02-06 NOTE — Patient Instructions (Signed)
Medication Instructions:  NO CHANGES- Your physician recommends that you continue on your current medications as directed. Please refer to the Current Medication list given to you today.  If you need a refill on your cardiac medications before your next appointment, please call your pharmacy.  Labwork: CBC AND BMET TODAY HERE IN OUR OFFICE AT [facility]  Take the provided lab slips with you to the lab for your blood draw.   Testing/Procedures: Echocardiogram - Your physician has requested that you have an echocardiogram. Echocardiography is a painless test that uses sound waves to create images of your heart. It provides your doctor with information about the size and shape of your heart and how well your heart's chambers and valves are working. This procedure takes approximately one hour. There are no restrictions for this procedure. This will be performed at our [facility] location - [address].  Your physician has recommended that you have a pulmonary function test. Pulmonary Function Tests are a group of tests that measure how well air moves in and out of your lungs.  Follow-Up: Your physician wants you to follow-up in: 2 Rector DNP,AACC(NURSE PRACTITIONER) IF PRIMARY CARDIOLOGIST (CROITORU) IS  Thank you for choosing [facility] at [employer]!!

## 2018-02-07 LAB — CBC
HEMATOCRIT: 31.9 % — AB (ref 34.0–46.6)
HEMOGLOBIN: 9.8 g/dL — AB (ref 11.1–15.9)
MCH: 29.3 pg (ref 26.6–33.0)
MCHC: 30.7 g/dL — AB (ref 31.5–35.7)
MCV: 96 fL (ref 79–97)
Platelets: 175 10*3/uL (ref 150–450)
RBC: 3.34 x10E6/uL — AB (ref 3.77–5.28)
RDW: 17.9 % — ABNORMAL HIGH (ref 12.3–15.4)
WBC: 3.5 10*3/uL (ref 3.4–10.8)

## 2018-02-07 LAB — BASIC METABOLIC PANEL
BUN / CREAT RATIO: 12 (ref 12–28)
BUN: 23 mg/dL (ref 8–27)
CO2: 25 mmol/L (ref 20–29)
CREATININE: 1.97 mg/dL — AB (ref 0.57–1.00)
Calcium: 9.4 mg/dL (ref 8.7–10.3)
Chloride: 105 mmol/L (ref 96–106)
GFR calc non Af Amer: 26 mL/min/{1.73_m2} — ABNORMAL LOW (ref >59)
GFR, EST AFRICAN AMERICAN: 29 mL/min/{1.73_m2} — AB (ref >59)
GLUCOSE: 142 mg/dL — AB (ref 65–99)
Potassium: 4 mmol/L (ref 3.5–5.2)
Sodium: 146 mmol/L — ABNORMAL HIGH (ref 134–144)

## 2018-02-16 ENCOUNTER — Other Ambulatory Visit: Payer: Self-pay

## 2018-02-16 ENCOUNTER — Encounter (HOSPITAL_COMMUNITY): Payer: Medicare HMO

## 2018-02-16 ENCOUNTER — Ambulatory Visit (HOSPITAL_COMMUNITY): Payer: Medicare HMO | Attending: Cardiology

## 2018-02-16 DIAGNOSIS — E785 Hyperlipidemia, unspecified: Secondary | ICD-10-CM | POA: Diagnosis not present

## 2018-02-16 DIAGNOSIS — I428 Other cardiomyopathies: Secondary | ICD-10-CM | POA: Diagnosis not present

## 2018-02-16 DIAGNOSIS — I110 Hypertensive heart disease with heart failure: Secondary | ICD-10-CM | POA: Diagnosis not present

## 2018-02-16 DIAGNOSIS — Z79899 Other long term (current) drug therapy: Secondary | ICD-10-CM | POA: Diagnosis not present

## 2018-02-16 DIAGNOSIS — R0600 Dyspnea, unspecified: Secondary | ICD-10-CM | POA: Diagnosis not present

## 2018-02-16 DIAGNOSIS — I5042 Chronic combined systolic (congestive) and diastolic (congestive) heart failure: Secondary | ICD-10-CM | POA: Diagnosis not present

## 2018-02-16 DIAGNOSIS — D6851 Activated protein C resistance: Secondary | ICD-10-CM | POA: Diagnosis not present

## 2018-02-16 DIAGNOSIS — G4733 Obstructive sleep apnea (adult) (pediatric): Secondary | ICD-10-CM | POA: Insufficient documentation

## 2018-02-16 DIAGNOSIS — I071 Rheumatic tricuspid insufficiency: Secondary | ICD-10-CM | POA: Insufficient documentation

## 2018-02-16 DIAGNOSIS — I519 Heart disease, unspecified: Secondary | ICD-10-CM

## 2018-02-16 DIAGNOSIS — E119 Type 2 diabetes mellitus without complications: Secondary | ICD-10-CM | POA: Diagnosis not present

## 2018-02-16 MED ORDER — PERFLUTREN LIPID MICROSPHERE
1.0000 mL | INTRAVENOUS | Status: AC | PRN
  Administered 2018-02-16: 3 mL via INTRAVENOUS

## 2018-02-19 ENCOUNTER — Other Ambulatory Visit: Payer: Self-pay

## 2018-02-19 DIAGNOSIS — Z79899 Other long term (current) drug therapy: Secondary | ICD-10-CM

## 2018-02-19 NOTE — Progress Notes (Signed)
Notes recorded by Lendon Colonel, NP on 02/18/2018 at 2:08 PM EDT Echocardiogram has worsened concerning her heart pumping function form 45% to 25%. She will need to be seen by her cardiologist or another provider to make changes in her medications, possibly add Entresto. She will need labs, BMET to assess kidney function, along with BNP first. Not sure when next appoint is scheduled. Will need to be seen sooner than later if appointment is not scheduled yet,

## 2018-02-20 NOTE — Progress Notes (Signed)
Cardiology Office Note   Date:  02/21/2018   ID:  Patricia Morgan, DOB 10-18-1950, MRN [id number]  PCP:  Patricia Contras, MD  Cardiologist:  Dr.Croitoru  Chief Complaint  Patient presents with  . Follow-up  . Cardiomyopathy     History of Present Illness: Patricia Morgan is a 67 y.o. female who presents for ongoing assessment and management of HTN, NICM (suspected Adriamycin-related), CHF, and hyperlipidemia. Other history includes CKD Stage III, recurrent venous thromboembolic events (Factor V Leiden).   On last office visit she had worsening dyspnea she was continued on lasix 80 mg daily. I repeated her echocardiogram. Echo revealed EF of 25% which is significantly reduced from 45% on prior echo.  Follow up BMET and BNP was ordered.   She states she is feeling much better on higher dose of lasix (from 40 mg BID to 80 mg in the am and 40 mg in the pm).  She is breathing better weight is down 10 lbs.    Past Medical History:  Diagnosis Date  . ANEMIA-NOS   . BREAST CANCER, HX OF 1994 & 1995   1994>>recurrence in 1995, R breast, s/p lumpectomy>>mastectomy  . Cancer ([place])   . CHF (congestive heart failure) ([place])   . CHRONIC KIDNEY DISEASE STAGE III (MODERATE)   . Clotting disorder ([place])    on eliquis for Factor 5 disorder  . Colon polyps   . DIABETES MELLITUS, TYPE II   . Diverticulosis   . HYPERLIPIDEMIA   . HYPERTENSION   . OBSTRUCTIVE SLEEP APNEA    CPAP  . Personal history of chemotherapy   . Proteinuria   . PULMONARY EMBOLISM 09/24/2008   anticoag thru 03/2010  . Sleep apnea    Wears CPAP nightly    Past Surgical History:  Procedure Laterality Date  . BREAST SURGERY    . CARDIAC CATHETERIZATION N/A 11/02/2015   Procedure: Right/Left Heart Cath and Coronary Angiography;  Surgeon: Burnell Blanks, MD;  Location: [place] [facility];  Service: Cardiovascular;  Laterality: N/A;  . insertion port a cath    . MASTECTOMY Right 1995   Right  . PORT-A-CATH REMOVAL         Current Outpatient Medications  Medication Sig Dispense Refill  . acetaminophen (TYLENOL) 500 MG tablet Take 500 mg by mouth every 6 (six) hours as needed for mild pain.    [name] allopurinol (ZYLOPRIM) 100 MG tablet Take 100 mg by mouth 2 (two) times daily.     . carvedilol (COREG) 25 MG tablet TAKE 1 TABLET (25 MG TOTAL) BY MOUTH 2 (TWO) TIMES DAILY WITH MEALS 180 tablet 3  . colchicine 0.6 MG tablet Take 0.6 mg by mouth daily.   1  . ELIQUIS 5 MG TABS tablet TAKE 1 TABLET TWICE DAILY 180 tablet 6  . furosemide (LASIX) 40 MG tablet Take 40 mg by mouth 2 (two) times daily. Take 80 mg in the morning and 40 mg in the evening    . hydrALAZINE (APRESOLINE) 10 MG tablet TAKE 1 TABLET (10 MG) BY MOUTH THREE TIMES DAILY 270 tablet 3  . Insulin Glargine (LANTUS SOLOSTAR) 100 UNIT/ML Solostar Pen Inject 15 Units into the skin as needed. Per sliding scale.     . insulin lispro (HUMALOG KWIKPEN) 100 UNIT/ML KiwkPen Inject 10 Units into the skin as needed.     . isosorbide mononitrate (IMDUR) 30 MG 24 hr tablet TAKE 1 TABLET (30 MG) BY MOUTH DAILY 90 tablet 3  . rosuvastatin (CRESTOR) 10  MG tablet TAKE 1 TABLET EVERY DAY 90 tablet 3  . TRUE METRIX BLOOD GLUCOSE TEST test strip      Current Facility-Administered Medications  Medication Dose Route Frequency Provider Last Rate Last Dose  . 0.9 %  sodium chloride infusion  500 mL Intravenous Continuous Irene Shipper, MD        Allergies:   Patient has no known allergies.    Social History:  The patient  reports that she has never smoked. She has never used smokeless tobacco. She reports that she does not drink alcohol or use drugs.   Family History:  The patient's family history includes Asthma in her father; Diabetes in her sister; Parkinson's disease in her mother.    ROS: All other systems are reviewed and negative. Unless otherwise mentioned in H&P    PHYSICAL EXAM: VS:  BP 115/68   Pulse 77   Ht 5\' 1"  (1.549 m)   Wt 192 lb 6.4 oz (87.3  kg)   BMI 36.35 kg/m  , BMI Body mass index is 36.35 kg/m. GEN: Well nourished, well developed, in no acute distress HEENT: normal Neck: no JVD, carotid bruits, or masses Cardiac: RRR; no murmurs, rubs, or gallops,no edema  Respiratory:  Clear to auscultation bilaterally, normal work of breathing GI: soft, nontender, nondistended, + BS MS: no deformity or atrophy Skin: warm and dry, no rash Neuro:  Strength and sensation are intact Psych: euthymic mood, full affect   EKG: Not completed today.    Recent Labs: 12/10/2017: B Natriuretic Peptide 1,143.1 01/05/2018: ALT 12 02/06/2018: BUN 23; Creatinine, Ser 1.97; Hemoglobin 9.8; Platelets 175; Potassium 4.0; Sodium 146    Lipid Panel    Component Value Date/Time   CHOL 184 09/13/2012 1357   TRIG 142 09/13/2012 1357   HDL 45 09/13/2012 1357   CHOLHDL 4.1 09/13/2012 1357   VLDL 28 09/13/2012 1357   LDLCALC 111 (H) 09/13/2012 1357   LDLDIRECT 141.3 10/14/2008 1004      Wt Readings from Last 3 Encounters:  02/21/18 192 lb 6.4 oz (87.3 kg)  02/06/18 203 lb 9.6 oz (92.4 kg)  01/05/18 194 lb (88 kg)      Other studies Reviewed: Echocardiogram 03/08/18  Left ventricle: The cavity size was normal. Systolic function was   severely reduced. The estimated ejection fraction was in the   range of 20% to 25%. Severe diffuse hypokinesis with regional   variations. The study is not technically sufficient to allow   evaluation of LV diastolic function. - Mitral valve: There was trivial regurgitation. - Right ventricle: Systolic function was moderately reduced. - Tricuspid valve: There was mild regurgitation. - Pulmonary arteries: PA peak pressure: 67 mm Hg (S).  Impressions:  - The right ventricular systolic pressure was increased consistent   with moderate pulmonary hypertension.  ASSESSMENT AND PLAN:  1. NICM Echo demonstrates reduced EF to 25% from 45%. I spoke with Dr. Sallyanne Kuster by phone to discuss next steps. Concerned  for ischemia causing reduction in her LV vs worsening cardiomyopathy.  Although her symptoms are significantly improved with increase in lasix, would like further evaluation.   It is is recommendation that we avoid cardiac cath or cardiac CT with CKD, and instead schedule her for [facility]. Also to consider ICD if no ischemia is identified. I have discussed both of these subjects with her. She is willing to proceed with Lexiscan and is willing to entertain device is necessary. Dr. Sallyanne Kuster would like to see her on follow  up.   2. Factor V Leiden: She remains on Eliquis. No bleeding or excessive bruising. She denies chest pain.   3. CKD: I am repeating her BMET today.   Of note, she is traveling to [place] in November for Thanksgiving and will stay until late January or February with her daughter. Daughter's husband is going to [place]. The patient would like to have all testing and or procedures completed by the time she leaves if possible.     Current medicines are reviewed at length with the patient today.    Labs/ tests ordered today include: BMET, Joseph Pierini. West Pugh, ANP, AACC   02/21/2018 1:40 PM    [facility] [place] [address] Office [phone] Fax [phone]

## 2018-02-21 ENCOUNTER — Encounter: Payer: Self-pay | Admitting: Adult Health

## 2018-02-21 ENCOUNTER — Ambulatory Visit (INDEPENDENT_AMBULATORY_CARE_PROVIDER_SITE_OTHER): Payer: Medicare HMO | Admitting: Adult Health

## 2018-02-21 VITALS — BP 115/68 | HR 77 | Ht 61.0 in | Wt 192.4 lb

## 2018-02-21 DIAGNOSIS — R931 Abnormal findings on diagnostic imaging of heart and coronary circulation: Secondary | ICD-10-CM | POA: Diagnosis not present

## 2018-02-21 DIAGNOSIS — I429 Cardiomyopathy, unspecified: Secondary | ICD-10-CM

## 2018-02-21 DIAGNOSIS — Z79899 Other long term (current) drug therapy: Secondary | ICD-10-CM | POA: Diagnosis not present

## 2018-02-21 DIAGNOSIS — D689 Coagulation defect, unspecified: Secondary | ICD-10-CM

## 2018-02-21 DIAGNOSIS — E7800 Pure hypercholesterolemia, unspecified: Secondary | ICD-10-CM

## 2018-02-21 MED ORDER — FUROSEMIDE 40 MG PO TABS: 80.0000 mg | ORAL_TABLET | Freq: Two times a day (BID) | ORAL | 1 refills | Status: DC

## 2018-02-21 NOTE — Patient Instructions (Addendum)
Medication Instructions:  NO CHANGES- Your physician recommends that you continue on your current medications as directed. Please refer to the Current Medication list given to you today.  If you need a refill on your cardiac medications before your next appointment, please call your pharmacy.  TESTING: Your physician has requested that you have a lexiscan myoview. A cardiac stress test is a cardiological test that measures the heart's ability to respond to external stress in a controlled clinical environment. The stress response is induced by intravenous pharmacological stimulation.   Labwork: BMET TODAY HERE IN OUR OFFICE AT [facility]  Take the provided lab slips with you to the lab for your blood draw.  If you have labs (blood work) drawn today and your tests are completely normal, you will receive your results only by: [name] MyChart Message (if you have MyChart) OR . A paper copy in the mail If you have any lab test that is abnormal or we need to change your treatment, we will call you to review the results.  Follow-Up: At [facility], you and your health needs are our priority.  As part of our continuing mission to provide you with exceptional heart care, we have created designated Provider Care Teams.  These Care Teams include your primary Cardiologist (physician) and Advanced Practice Providers (APPs -  Physician Assistants and Nurse Practitioners) who all work together to provide you with the care you need, when you need it. You will need a follow up appointment in [place] ([facility]).    Thank you for choosing [facility] at [facility]!!

## 2018-02-22 LAB — BASIC METABOLIC PANEL
BUN/Creatinine Ratio: 15 (ref 12–28)
BUN: 34 mg/dL — ABNORMAL HIGH (ref 8–27)
CALCIUM: 9.6 mg/dL (ref 8.7–10.3)
CO2: 27 mmol/L (ref 20–29)
CREATININE: 2.25 mg/dL — AB (ref 0.57–1.00)
Chloride: 104 mmol/L (ref 96–106)
GFR calc Af Amer: 25 mL/min/{1.73_m2} — ABNORMAL LOW (ref >59)
GFR, EST NON AFRICAN AMERICAN: 22 mL/min/{1.73_m2} — AB (ref >59)
Glucose: 110 mg/dL — ABNORMAL HIGH (ref 65–99)
POTASSIUM: 4.3 mmol/L (ref 3.5–5.2)
Sodium: 146 mmol/L — ABNORMAL HIGH (ref 134–144)

## 2018-02-23 ENCOUNTER — Ambulatory Visit (HOSPITAL_COMMUNITY)
Admission: RE | Admit: 2018-02-23 | Discharge: 2018-02-23 | Disposition: A | Discharge status: Home / Self Care | Payer: Medicare HMO | Source: Ambulatory Visit | Attending: Adult Health | Admitting: Adult Health

## 2018-02-23 DIAGNOSIS — R0600 Dyspnea, unspecified: Secondary | ICD-10-CM | POA: Insufficient documentation

## 2018-02-23 LAB — PULMONARY FUNCTION TEST
DL/VA % pred: 76 %
DL/VA: 3.46 ml/min/mmHg/L
DLCO UNC: 8.48 ml/min/mmHg
DLCO cor % pred: 45 %
DLCO cor: 9.76 ml/min/mmHg
DLCO unc % pred: 39 %
FEF 25-75 Post: 1.92 L/sec
FEF 25-75 Pre: 2.12 L/sec
FEF2575-%Change-Post: -9 %
FEF2575-%Pred-Post: 119 %
FEF2575-%Pred-Pre: 132 %
FEV1-%CHANGE-POST: -3 %
FEV1-%PRED-POST: 80 %
FEV1-%Pred-Pre: 82 %
FEV1-POST: 1.36 L
FEV1-PRE: 1.4 L
FEV1FVC-%Change-Post: 0 %
FEV1FVC-%Pred-Pre: 113 %
FEV6-%Change-Post: -3 %
FEV6-%PRED-PRE: 75 %
FEV6-%Pred-Post: 73 %
FEV6-POST: 1.53 L
FEV6-Pre: 1.59 L
FEV6FVC-%PRED-POST: 104 %
FEV6FVC-%PRED-PRE: 104 %
FVC-%CHANGE-POST: -3 %
FVC-%PRED-PRE: 72 %
FVC-%Pred-Post: 70 %
FVC-POST: 1.53 L
FVC-PRE: 1.59 L
POST FEV6/FVC RATIO: 100 %
PRE FEV6/FVC RATIO: 100 %
Post FEV1/FVC ratio: 89 %
Pre FEV1/FVC ratio: 88 %
RV % PRED: 63 %
RV: 1.32 L
TLC % PRED: 64 %
TLC: 3.05 L

## 2018-02-23 MED ORDER — ALBUTEROL SULFATE (2.5 MG/3ML) 0.083% IN NEBU
2.5000 mg | INHALATION_SOLUTION | Freq: Once | RESPIRATORY_TRACT | Status: AC
  Administered 2018-02-23: 2.5 mg via RESPIRATORY_TRACT

## 2018-02-26 ENCOUNTER — Other Ambulatory Visit: Payer: Self-pay

## 2018-02-26 ENCOUNTER — Ambulatory Visit: Payer: Medicare HMO | Admitting: Cardiovascular Disease

## 2018-02-26 DIAGNOSIS — R942 Abnormal results of pulmonary function studies: Secondary | ICD-10-CM

## 2018-02-26 MED ORDER — FUROSEMIDE 40 MG PO TABS: 80.0000 mg | ORAL_TABLET | Freq: Two times a day (BID) | ORAL | 1 refills | Status: DC

## 2018-02-26 NOTE — Progress Notes (Signed)
Notes recorded by Lendon Colonel, NP on 02/25/2018 at 9:49 AM EDT The PFTs are abnormal with some airway restriction. She will need to be referred to pulmonology for further recommendations.

## 2018-02-28 ENCOUNTER — Telehealth (HOSPITAL_COMMUNITY): Payer: Self-pay

## 2018-02-28 NOTE — Telephone Encounter (Signed)
Encounter complete. 

## 2018-03-02 ENCOUNTER — Other Ambulatory Visit: Payer: Self-pay | Admitting: *Deleted

## 2018-03-02 ENCOUNTER — Ambulatory Visit (HOSPITAL_COMMUNITY)
Admission: RE | Admit: 2018-03-02 | Discharge: 2018-03-02 | Disposition: A | Discharge status: Home / Self Care | Payer: Medicare HMO | Source: Ambulatory Visit | Attending: Cardiovascular Disease | Admitting: Cardiovascular Disease

## 2018-03-02 DIAGNOSIS — I429 Cardiomyopathy, unspecified: Secondary | ICD-10-CM | POA: Diagnosis not present

## 2018-03-02 DIAGNOSIS — Z79899 Other long term (current) drug therapy: Secondary | ICD-10-CM

## 2018-03-02 DIAGNOSIS — R931 Abnormal findings on diagnostic imaging of heart and coronary circulation: Secondary | ICD-10-CM

## 2018-03-02 LAB — BASIC METABOLIC PANEL
BUN/Creatinine Ratio: 14 (ref 12–28)
BUN: 30 mg/dL — ABNORMAL HIGH (ref 8–27)
CO2: 25 mmol/L (ref 20–29)
Calcium: 9.5 mg/dL (ref 8.7–10.3)
Chloride: 101 mmol/L (ref 96–106)
Creatinine, Ser: 2.19 mg/dL — ABNORMAL HIGH (ref 0.57–1.00)
GFR, EST AFRICAN AMERICAN: 26 mL/min/{1.73_m2} — AB (ref >59)
GFR, EST NON AFRICAN AMERICAN: 22 mL/min/{1.73_m2} — AB (ref >59)
Glucose: 136 mg/dL — ABNORMAL HIGH (ref 65–99)
POTASSIUM: 3.9 mmol/L (ref 3.5–5.2)
SODIUM: 139 mmol/L (ref 134–144)

## 2018-03-02 LAB — MYOCARDIAL PERFUSION IMAGING
CHL CUP NUCLEAR SDS: 0
CHL CUP NUCLEAR SRS: 10
CHL CUP NUCLEAR SSS: 10
LVDIAVOL: 175 mL (ref 46–106)
LVSYSVOL: 137 mL
NUC STRESS TID: 1.15
Peak HR: 87 {beats}/min
Rest HR: 75 {beats}/min

## 2018-03-02 MED ORDER — REGADENOSON 0.4 MG/5ML IV SOLN
0.4000 mg | Freq: Once | INTRAVENOUS | Status: AC
  Administered 2018-03-02: 0.4 mg via INTRAVENOUS

## 2018-03-02 MED ORDER — TECHNETIUM TC 99M TETROFOSMIN IV KIT
31.4000 | PACK | Freq: Once | INTRAVENOUS | Status: AC | PRN
  Administered 2018-03-02: 31.4 via INTRAVENOUS
  Filled 2018-03-02: qty 32

## 2018-03-02 MED ORDER — TECHNETIUM TC 99M TETROFOSMIN IV KIT
10.7000 | PACK | Freq: Once | INTRAVENOUS | Status: AC | PRN
  Administered 2018-03-02: 10.7 via INTRAVENOUS
  Filled 2018-03-02: qty 11

## 2018-03-08 ENCOUNTER — Ambulatory Visit: Payer: Medicare HMO | Admitting: Cardiovascular Disease

## 2018-03-08 ENCOUNTER — Encounter: Payer: Self-pay | Admitting: Cardiovascular Disease

## 2018-03-08 ENCOUNTER — Ambulatory Visit (INDEPENDENT_AMBULATORY_CARE_PROVIDER_SITE_OTHER): Payer: Medicare HMO | Admitting: Cardiovascular Disease

## 2018-03-08 VITALS — BP 116/70 | HR 81 | Ht 61.0 in | Wt 197.0 lb

## 2018-03-08 DIAGNOSIS — I428 Other cardiomyopathies: Secondary | ICD-10-CM | POA: Diagnosis not present

## 2018-03-08 DIAGNOSIS — I10 Essential (primary) hypertension: Secondary | ICD-10-CM | POA: Diagnosis not present

## 2018-03-08 DIAGNOSIS — R9431 Abnormal electrocardiogram [ECG] [EKG]: Secondary | ICD-10-CM

## 2018-03-08 DIAGNOSIS — Z9189 Other specified personal risk factors, not elsewhere classified: Secondary | ICD-10-CM

## 2018-03-08 DIAGNOSIS — D6851 Activated protein C resistance: Secondary | ICD-10-CM | POA: Diagnosis not present

## 2018-03-08 DIAGNOSIS — I5042 Chronic combined systolic (congestive) and diastolic (congestive) heart failure: Secondary | ICD-10-CM | POA: Diagnosis not present

## 2018-03-08 DIAGNOSIS — N184 Chronic kidney disease, stage 4 (severe): Secondary | ICD-10-CM

## 2018-03-08 DIAGNOSIS — G4733 Obstructive sleep apnea (adult) (pediatric): Secondary | ICD-10-CM | POA: Diagnosis not present

## 2018-03-08 DIAGNOSIS — E669 Obesity, unspecified: Secondary | ICD-10-CM

## 2018-03-08 DIAGNOSIS — Z7901 Long term (current) use of anticoagulants: Secondary | ICD-10-CM | POA: Diagnosis not present

## 2018-03-08 DIAGNOSIS — Z6835 Body mass index (BMI) 35.0-35.9, adult: Secondary | ICD-10-CM

## 2018-03-08 DIAGNOSIS — E1169 Type 2 diabetes mellitus with other specified complication: Secondary | ICD-10-CM

## 2018-03-08 NOTE — H&P (View-Only) (Signed)
Cardiology Office Note    Date:  03/08/2018   ID:  Patricia Morgan, DOB Jun 13, 1950, MRN [id number]  PCP:  Antony Contras, MD  Cardiologist:   Sanda Klein, MD   Chief Complaint  Patient presents with  . Congestive Heart Failure    History of Present Illness:  Patricia Morgan is a 67 y.o. 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  She had some problems with fluid retention and heart failure exacerbation earlier this month.  After an increase in her dose of diuretics she feels much better.  She no longer has any problems with exertional dyspnea and has no edema.  An echocardiogram was ordered and shows worsening of left ventricular systolic function with an ejection fraction that is now down to about 25%.  A nuclear stress test was performed and shows a fixed inferior wall defect that I suspect is diaphragmatic attenuation artifact.  There was no reversible ischemia.  A similar EF of 22% was reported.  She is compliant with carvedilol in the maximum usual dose and also takes hydralazine/nitrates at the highest dose tolerated by her blood pressure.  She is not receiving RAAS inhibitors due to chronic kidney disease.  Coronary angiography performed June 2017 showed no evidence of coronary artery disease but confirmed elevated filling pressure. PA pressure was 57/18 in the setting of a mean pulmonary wedge pressure of 33 mmHg. Echo showed ejection fraction of 45%. Nuclear scintigraphy calculated ejection fraction of 38%. It also suggested the presence of perfusion abnormalities, false positive by coronary angiography. She sees Dr. Moshe Cipro for chronic kidney disease and her baseline creatinine is around 2.0-2.5. Her  primary care physician is Dr. Antony Contras. Dr. Elsworth Soho manages her sleep apnea treatment.  Her sister Patricia Morgan has a history of coronary artery disease and long QT syndrome.  Past Medical History:  Diagnosis Date  . ANEMIA-NOS   . BREAST CANCER, HX OF 1994 & 1995   1994>>recurrence in 1995, R breast, s/p lumpectomy>>mastectomy  . Cancer ([place])   . CHF (congestive heart failure) ([place])   . CHRONIC KIDNEY DISEASE STAGE III (MODERATE)   . Clotting disorder ([place])    on eliquis for Factor 5 disorder  . Colon polyps   . DIABETES MELLITUS, TYPE II   . Diverticulosis   . HYPERLIPIDEMIA   . HYPERTENSION   . OBSTRUCTIVE SLEEP APNEA    CPAP  . Personal history of chemotherapy   . Proteinuria   . PULMONARY EMBOLISM 09/24/2008   anticoag thru 03/2010  . Sleep apnea    Wears CPAP nightly    Past Surgical History:  Procedure Laterality Date  . BREAST SURGERY    . CARDIAC CATHETERIZATION N/A 11/02/2015   Procedure: Right/Left Heart Cath and Coronary Angiography;  Surgeon: Burnell Blanks, MD;  Location: [facility];  Service: Cardiovascular;  Laterality: N/A;  . insertion port a cath    . MASTECTOMY Right 1995   Right  . PORT-A-CATH REMOVAL      Current Medications: Outpatient Medications Prior to Visit  Medication Sig Dispense Refill  . acetaminophen (TYLENOL) 500 MG tablet Take 500 mg by mouth every 6 (six) hours as needed for mild pain.    [name] allopurinol (ZYLOPRIM) 100 MG tablet Take 100 mg by mouth 2 (two) times daily.     . carvedilol (COREG)  25 MG tablet TAKE 1 TABLET (25 MG TOTAL) BY MOUTH 2 (TWO) TIMES DAILY WITH MEALS 180 tablet 3  . colchicine 0.6 MG tablet Take 0.6 mg by mouth daily.   1  . ELIQUIS 5 MG TABS tablet TAKE 1 TABLET TWICE DAILY 180 tablet 6  . furosemide (LASIX) 40 MG tablet Take 2 tablets (80 mg total) by mouth 2 (two) times daily. Take 80 mg in the morning and 40 mg in the evening 270 tablet 1  . hydrALAZINE (APRESOLINE) 10 MG tablet TAKE 1 TABLET (10 MG)  BY MOUTH THREE TIMES DAILY 270 tablet 3  . Insulin Glargine (LANTUS SOLOSTAR) 100 UNIT/ML Solostar Pen Inject 15 Units into the skin as needed. Per sliding scale.     . insulin lispro (HUMALOG KWIKPEN) 100 UNIT/ML KiwkPen Inject 10 Units into the skin as needed.     . isosorbide mononitrate (IMDUR) 30 MG 24 hr tablet TAKE 1 TABLET (30 MG) BY MOUTH DAILY 90 tablet 3  . rosuvastatin (CRESTOR) 10 MG tablet TAKE 1 TABLET EVERY DAY 90 tablet 3  . TRUE METRIX BLOOD GLUCOSE TEST test strip      Facility-Administered Medications Prior to Visit  Medication Dose Route Frequency Provider Last Rate Last Dose  . 0.9 %  sodium chloride infusion  500 mL Intravenous Continuous Irene Shipper, MD         Allergies:   Patient has no known allergies.   Social History   Socioeconomic History  . Marital status: Divorced    Spouse name: Not on file  . Number of children: 2  . Years of education: 64  . Highest education level: Not on file  Occupational History  . Occupation: retired  Scientific laboratory technician  . Financial resource strain: Not on file  . Food insecurity:    Worry: Not on file    Inability: Not on file  . Transportation needs:    Medical: Not on file    Non-medical: Not on file  Tobacco Use  . Smoking status: Never Smoker  . Smokeless tobacco: Never Used  . Tobacco comment: Lives alone-divorced  Substance and Sexual Activity  . Alcohol use: No    Alcohol/week: 0.0 standard drinks  . Drug use: No  . Sexual activity: Not on file  Lifestyle  . Physical activity:    Days per week: Not on file    Minutes per session: Not on file  . Stress: Not on file  Relationships  . Social connections:    Talks on phone: Not on file    Gets together: Not on file    Attends religious service: Not on file    Active member of club or organization: Not on file    Attends meetings of clubs or organizations: Not on file    Relationship status: Not on file  Other Topics Concern  . Not on file  Social History  Narrative  . Not on file     Family History:  The patient's family history includes Asthma in her father; Diabetes in her sister; Parkinson's disease in her mother. Her sister had long QT syndrome, but no documented ventricular arrhythmia  ROS:   Please see the history of present illness.    ROS All other systems reviewed and are negative.   PHYSICAL EXAM:   VS:  BP 116/70 (BP Location: Right Arm, Patient Position: Sitting, Cuff Size: Normal)   Pulse 81   Ht 5\' 1"  (1.549 m)   Wt 197 lb (89.4  kg)   SpO2 97%   BMI 37.22 kg/m      General: Alert, oriented x3, no distress, moderately obese. Head: no evidence of trauma, PERRL, EOMI, no exophtalmos or lid lag, no myxedema, no xanthelasma; normal ears, nose and oropharynx Neck: 5-6 cm elevation in jugular venous pulsations and no hepatojugular reflux; brisk carotid pulses without delay and no carotid bruits Chest: clear to auscultation, no signs of consolidation by percussion or palpation, normal fremitus, symmetrical and full respiratory excursions Cardiovascular: normal position and quality of the apical impulse, regular rhythm, normal first and second heart sounds, no murmurs, rubs or gallops Abdomen: no tenderness or distention, no masses by palpation, no abnormal pulsatility or arterial bruits, normal bowel sounds, no hepatosplenomegaly Extremities: no clubbing, cyanosis; 1-2 + symmetrical ankle edema; 2+ radial, ulnar and brachial pulses bilaterally; 2+ right femoral, posterior tibial and dorsalis pedis pulses; 2+ left femoral, posterior tibial and dorsalis pedis pulses; no subclavian or femoral bruits Neurological: grossly nonfocal Psych: Normal mood and affect    Wt Readings from Last 3 Encounters:  03/08/18 197 lb (89.4 kg)  03/02/18 192 lb (87.1 kg)  02/21/18 192 lb 6.4 oz (87.3 kg)      Studies/Labs Reviewed:   EKG:  EKG is ordered today.  It shows sinus rhythm with occasional PACs, first-degree AV block (218 ms), narrow  QRS 98 ms, nonspecific T wave changes, prolonged QTC 492 ms  Recent Labs: 12/10/2017: B Natriuretic Peptide 1,143.1 01/05/2018: ALT 12 02/06/2018: Hemoglobin 9.8; Platelets 175 03/02/2018: BUN 30; Creatinine, Ser 2.19; Potassium 3.9; Sodium 139   Lipid Panel    Component Value Date/Time   CHOL 184 09/13/2012 1357   TRIG 142 09/13/2012 1357   HDL 45 09/13/2012 1357   CHOLHDL 4.1 09/13/2012 1357   VLDL 28 09/13/2012 1357   LDLCALC 111 (H) 09/13/2012 1357   LDLDIRECT 141.3 10/14/2008 1004      ASSESSMENT:    1. Chronic combined systolic and diastolic heart failure ([place])   2. Non-ischemic cardiomyopathy ([place])   3. Long QT interval   4. At risk for sudden cardiac death   5. Factor V Leiden ([place])   6. Long term current use of anticoagulant   7. Essential hypertension   8. OSA (obstructive sleep apnea)   9. CKD (chronic kidney disease) stage 4, GFR 15-29 ml/min (HCC)   10. Severe obesity (BMI 35.0-35.9 with comorbidity) ([place])   11. Diabetes mellitus type 2 in obese [facility])      PLAN:  In order of problems listed above:  1. CHF: She feels better but still remains slightly hypervolemic by exam, NYHA functional class I-II.  She's lost some true weight and we need to constantly reassess her "dry weight".  Need to keep in mind that excessive diuresis can jeopardize renal function.  Carvedilol, max dose.  Unable to prescribe RAAS inhibitors due to renal dysfunction, on hydralazine/nitrates vasodilator therapy. 2. CMP: Coronary arteries a couple of years ago, by angiography.  Possibly received Adriamycin for chemotherapy for breast cancer in 1994. 3. Long QT: Her sister has long QT syndrome.  Teresa's QT interval has always been mildly prolonged generally around 460-470 ms.  Today's ECG shows the longest QTC that I think we have recorded.  It may be related to electrolyte abnormalities with the increased dose of diuretic.  Will reassess labs just before her defibrillator implantation.  To  date, Karlena has not had detected ventricular tachycardia. 4. Risk for SCD: She meets criteria for primary prevention ICD  implantation for non ischemic cardiomyopathy (left ventricular ejection fraction under 35%, heart failure NYHA class II, on comprehensive medical therapy >90 days).  I have had a thorough discussion with the patient reviewing options.  The patient had opportunities to ask questions and have them answered. The patient and I have decided together through a shared decision making process to go ahead with single-chamber ICD at this time.  Risks, benefits, alternatives to ICD implantation were discussed in detail with the patient today. The patient  understands that the risks include but are not limited to bleeding, infection, pneumothorax, perforation, tamponade, vascular damage, renal failure, MI, stroke, death, inappropriate shocks, and lead dislodgement and she wishes to proceed.  We will therefore schedule device implantation at the next available time. 5. Recurrent DVT/PE:  hypercoagulable state due to heterozygous factor V Leiden. Should remain on lifelong anticoagulation.  We will plan to continue anticoagulation at the time of device implantation. 6. Eliquis: Tolerated without bleeding problems.  Despite decreased renal function she still qualifies for full dose Eliquis in view of her age and body habitus. 7. HTN: Excellent control 8. OSA: Reports 100% compliance with CPAP.  Also reports benefit from this treatment.  She has a follow-up appointment with Dr. Elsworth Soho on November 15. 9. CKD: most recent creatinine 2.19.  Sees Dr. Moshe Cipro.  Baseline creatinine around 2.0. GFR approximately 25 10. Obesity: Still making progress with weight loss, she is determined to continue slowly losing weight.  We will have to constantly reassess her dry weight.  Her weight does slightly increase the risk of complications with defibrillator implantation. 11. DM: Reports good control.   Medication  Adjustments/Labs and Tests Ordered: Current medicines are reviewed at length with the patient today.  Concerns regarding medicines are outlined above.  Medication changes, Labs and Tests ordered today are listed in the Patient Instructions below. Patient Instructions   [facility] [place], [address]  [place], [place] [address]  Phone: [phone] Fax: [phone]  You are scheduled for an Implantable Cardioverter Defibrillator implant on Wednesday, October 30th, 2019 with Dr Sallyanne Kuster.  Please arrive at the [facility] at 3:30 pm on the day of your procedure.  1. You may have a light, early breakfast the morning of your procedure. NOTHING TO EAT AFTER 8:00 AM. 2. Complete labwork the week before the procedure. There is a lab located within our office if we are convenient for you. You do not have to be fasting. 3. You may continue your current medications except: if you use insulin at night please take 1/2 dose the night before your procedure 4. Plan for an overnight stay. 5. Bring your insurance cards and a list of your current medications. 6. Wash your chest and neck with the surgical scrub provided the evening before and the morning of your procedure. Rinse well. Instructions have been provided.  * Special note:  Every effort is made to have your procedure done on time.  Occasionally there are emergencies that present themselves at the hospital that may cause delays.  Please be patient if a delay does occur.  If you have ANY questions after you get home, please call the office [phone].  Enid Cutter, CMA Dr Sallyanne Kuster      Signed, Sanda Klein, MD  03/08/2018 5:53 PM    [address], [place], [place]  [address] Phone: [phone]; Fax: [phone]    

## 2018-03-08 NOTE — Patient Instructions (Signed)
  [facility] at [address], [place]  [place], [place] [address]  Phone: [phone] Fax: [phone]  You are scheduled for an Implantable Cardioverter Defibrillator implant on Wednesday, October 30th, 2019 with Dr Sallyanne Kuster.  Please arrive at the [facility] at 3:30 pm on the day of your procedure.  1. You may have a light, early breakfast the morning of your procedure. NOTHING TO EAT AFTER 8:00 AM. 2. Complete labwork the week before the procedure. There is a lab located within our office if we are convenient for you. You do not have to be fasting. 3. You may continue your current medications except: if you use insulin at night please take 1/2 dose the night before your procedure 4. Plan for an overnight stay. 5. Bring your insurance cards and a list of your current medications. 6. Wash your chest and neck with the surgical scrub provided the evening before and the morning of your procedure. Rinse well. Instructions have been provided.  * Special note:  Every effort is made to have your procedure done on time.  Occasionally there are emergencies that present themselves at the hospital that may cause delays.  Please be patient if a delay does occur.  If you have ANY questions after you get home, please call the office [phone].  [place]helley, CMA Dr Sallyanne Kuster

## 2018-03-08 NOTE — Progress Notes (Signed)
Cardiology Office Note    Date:  03/08/2018   ID:  Patricia Morgan, DOB 1950/06/11, MRN [id number]  PCP:  Antony Contras, MD  Cardiologist:   Sanda Klein, MD   Chief Complaint  Patient presents with  . Congestive Heart Failure    History of Present Illness:  Patricia Morgan is a 67 y.o. 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  She had some problems with fluid retention and heart failure exacerbation earlier this month.  After an increase in her dose of diuretics she feels much better.  She no longer has any problems with exertional dyspnea and has no edema.  An echocardiogram was ordered and shows worsening of left ventricular systolic function with an ejection fraction that is now down to about 25%.  A nuclear stress test was performed and shows a fixed inferior wall defect that I suspect is diaphragmatic attenuation artifact.  There was no reversible ischemia.  A similar EF of 22% was reported.  She is compliant with carvedilol in the maximum usual dose and also takes hydralazine/nitrates at the highest dose tolerated by her blood pressure.  She is not receiving RAAS inhibitors due to chronic kidney disease.  Coronary angiography performed June 2017 showed no evidence of coronary artery disease but confirmed elevated filling pressure. PA pressure was 57/18 in the setting of a mean pulmonary wedge pressure of 33 mmHg. Echo showed ejection fraction of 45%. Nuclear scintigraphy calculated ejection fraction of 38%. It also suggested the presence of perfusion abnormalities, false positive by coronary angiography. She sees Dr. Moshe Cipro for chronic kidney disease and her baseline creatinine is around 2.0-2.5. Her  primary care physician is Dr. Antony Contras. Dr. Elsworth Soho manages her sleep apnea treatment.  Her sister Phineas Semen has a history of coronary artery disease and long QT syndrome.  Past Medical History:  Diagnosis Date  . ANEMIA-NOS   . BREAST CANCER, HX OF 1994 & 1995   1994>>recurrence in 1995, R breast, s/p lumpectomy>>mastectomy  . Cancer ([place])   . CHF (congestive heart failure) ([place])   . CHRONIC KIDNEY DISEASE STAGE III (MODERATE)   . Clotting disorder ([place])    on eliquis for Factor 5 disorder  . Colon polyps   . DIABETES MELLITUS, TYPE II   . Diverticulosis   . HYPERLIPIDEMIA   . HYPERTENSION   . OBSTRUCTIVE SLEEP APNEA    CPAP  . Personal history of chemotherapy   . Proteinuria   . PULMONARY EMBOLISM 09/24/2008   anticoag thru 03/2010  . Sleep apnea    Wears CPAP nightly    Past Surgical History:  Procedure Laterality Date  . BREAST SURGERY    . CARDIAC CATHETERIZATION N/A 11/02/2015   Procedure: Right/Left Heart Cath and Coronary Angiography;  Surgeon: Burnell Blanks, MD;  Location: [facility];  Service: Cardiovascular;  Laterality: N/A;  . insertion port a cath    . MASTECTOMY Right 1995   Right  . PORT-A-CATH REMOVAL      Current Medications: Outpatient Medications Prior to Visit  Medication Sig Dispense Refill  . acetaminophen (TYLENOL) 500 MG tablet Take 500 mg by mouth every 6 (six) hours as needed for mild pain.    [name] allopurinol (ZYLOPRIM) 100 MG tablet Take 100 mg by mouth 2 (two) times daily.     . carvedilol (COREG)  25 MG tablet TAKE 1 TABLET (25 MG TOTAL) BY MOUTH 2 (TWO) TIMES DAILY WITH MEALS 180 tablet 3  . colchicine 0.6 MG tablet Take 0.6 mg by mouth daily.   1  . ELIQUIS 5 MG TABS tablet TAKE 1 TABLET TWICE DAILY 180 tablet 6  . furosemide (LASIX) 40 MG tablet Take 2 tablets (80 mg total) by mouth 2 (two) times daily. Take 80 mg in the morning and 40 mg in the evening 270 tablet 1  . hydrALAZINE (APRESOLINE) 10 MG tablet TAKE 1 TABLET (10 MG)  BY MOUTH THREE TIMES DAILY 270 tablet 3  . Insulin Glargine (LANTUS SOLOSTAR) 100 UNIT/ML Solostar Pen Inject 15 Units into the skin as needed. Per sliding scale.     . insulin lispro (HUMALOG KWIKPEN) 100 UNIT/ML KiwkPen Inject 10 Units into the skin as needed.     . isosorbide mononitrate (IMDUR) 30 MG 24 hr tablet TAKE 1 TABLET (30 MG) BY MOUTH DAILY 90 tablet 3  . rosuvastatin (CRESTOR) 10 MG tablet TAKE 1 TABLET EVERY DAY 90 tablet 3  . TRUE METRIX BLOOD GLUCOSE TEST test strip      Facility-Administered Medications Prior to Visit  Medication Dose Route Frequency Provider Last Rate Last Dose  . 0.9 %  sodium chloride infusion  500 mL Intravenous Continuous Irene Shipper, MD         Allergies:   Patient has no known allergies.   Social History   Socioeconomic History  . Marital status: Divorced    Spouse name: Not on file  . Number of children: 2  . Years of education: 29  . Highest education level: Not on file  Occupational History  . Occupation: retired  Scientific laboratory technician  . Financial resource strain: Not on file  . Food insecurity:    Worry: Not on file    Inability: Not on file  . Transportation needs:    Medical: Not on file    Non-medical: Not on file  Tobacco Use  . Smoking status: Never Smoker  . Smokeless tobacco: Never Used  . Tobacco comment: Lives alone-divorced  Substance and Sexual Activity  . Alcohol use: No    Alcohol/week: 0.0 standard drinks  . Drug use: No  . Sexual activity: Not on file  Lifestyle  . Physical activity:    Days per week: Not on file    Minutes per session: Not on file  . Stress: Not on file  Relationships  . Social connections:    Talks on phone: Not on file    Gets together: Not on file    Attends religious service: Not on file    Active member of club or organization: Not on file    Attends meetings of clubs or organizations: Not on file    Relationship status: Not on file  Other Topics Concern  . Not on file  Social History  Narrative  . Not on file     Family History:  The patient's family history includes Asthma in her father; Diabetes in her sister; Parkinson's disease in her mother. Her sister had long QT syndrome, but no documented ventricular arrhythmia  ROS:   Please see the history of present illness.    ROS All other systems reviewed and are negative.   PHYSICAL EXAM:   VS:  BP 116/70 (BP Location: Right Arm, Patient Position: Sitting, Cuff Size: Normal)   Pulse 81   Ht 5\' 1"  (1.549 m)   Wt 197 lb (89.4  kg)   SpO2 97%   BMI 37.22 kg/m      General: Alert, oriented x3, no distress, moderately obese. Head: no evidence of trauma, PERRL, EOMI, no exophtalmos or lid lag, no myxedema, no xanthelasma; normal ears, nose and oropharynx Neck: 5-6 cm elevation in jugular venous pulsations and no hepatojugular reflux; brisk carotid pulses without delay and no carotid bruits Chest: clear to auscultation, no signs of consolidation by percussion or palpation, normal fremitus, symmetrical and full respiratory excursions Cardiovascular: normal position and quality of the apical impulse, regular rhythm, normal first and second heart sounds, no murmurs, rubs or gallops Abdomen: no tenderness or distention, no masses by palpation, no abnormal pulsatility or arterial bruits, normal bowel sounds, no hepatosplenomegaly Extremities: no clubbing, cyanosis; 1-2 + symmetrical ankle edema; 2+ radial, ulnar and brachial pulses bilaterally; 2+ right femoral, posterior tibial and dorsalis pedis pulses; 2+ left femoral, posterior tibial and dorsalis pedis pulses; no subclavian or femoral bruits Neurological: grossly nonfocal Psych: Normal mood and affect    Wt Readings from Last 3 Encounters:  03/08/18 197 lb (89.4 kg)  03/02/18 192 lb (87.1 kg)  02/21/18 192 lb 6.4 oz (87.3 kg)      Studies/Labs Reviewed:   EKG:  EKG is ordered today.  It shows sinus rhythm with occasional PACs, first-degree AV block (218 ms), narrow  QRS 98 ms, nonspecific T wave changes, prolonged QTC 492 ms  Recent Labs: 12/10/2017: B Natriuretic Peptide 1,143.1 01/05/2018: ALT 12 02/06/2018: Hemoglobin 9.8; Platelets 175 03/02/2018: BUN 30; Creatinine, Ser 2.19; Potassium 3.9; Sodium 139   Lipid Panel    Component Value Date/Time   CHOL 184 09/13/2012 1357   TRIG 142 09/13/2012 1357   HDL 45 09/13/2012 1357   CHOLHDL 4.1 09/13/2012 1357   VLDL 28 09/13/2012 1357   LDLCALC 111 (H) 09/13/2012 1357   LDLDIRECT 141.3 10/14/2008 1004      ASSESSMENT:    1. Chronic combined systolic and diastolic heart failure ([place])   2. Non-ischemic cardiomyopathy ([place])   3. Long QT interval   4. At risk for sudden cardiac death   5. Factor V Leiden ([place])   6. Long term current use of anticoagulant   7. Essential hypertension   8. OSA (obstructive sleep apnea)   9. CKD (chronic kidney disease) stage 4, GFR 15-29 ml/min (HCC)   10. Severe obesity (BMI 35.0-35.9 with comorbidity) ([place])   11. Diabetes mellitus type 2 in obese [facility])      PLAN:  In order of problems listed above:  1. CHF: She feels better but still remains slightly hypervolemic by exam, NYHA functional class I-II.  She's lost some true weight and we need to constantly reassess her "dry weight".  Need to keep in mind that excessive diuresis can jeopardize renal function.  Carvedilol, max dose.  Unable to prescribe RAAS inhibitors due to renal dysfunction, on hydralazine/nitrates vasodilator therapy. 2. CMP: Coronary arteries a couple of years ago, by angiography.  Possibly received Adriamycin for chemotherapy for breast cancer in 1994. 3. Long QT: Her sister has long QT syndrome.  Teresa's QT interval has always been mildly prolonged generally around 460-470 ms.  Today's ECG shows the longest QTC that I think we have recorded.  It may be related to electrolyte abnormalities with the increased dose of diuretic.  Will reassess labs just before her defibrillator implantation.  To  date, Evadean has not had detected ventricular tachycardia. 4. Risk for SCD: She meets criteria for primary prevention ICD  implantation for non ischemic cardiomyopathy (left ventricular ejection fraction under 35%, heart failure NYHA class II, on comprehensive medical therapy >90 days).  I have had a thorough discussion with the patient reviewing options.  The patient had opportunities to ask questions and have them answered. The patient and I have decided together through a shared decision making process to go ahead with single-chamber ICD at this time.  Risks, benefits, alternatives to ICD implantation were discussed in detail with the patient today. The patient  understands that the risks include but are not limited to bleeding, infection, pneumothorax, perforation, tamponade, vascular damage, renal failure, MI, stroke, death, inappropriate shocks, and lead dislodgement and she wishes to proceed.  We will therefore schedule device implantation at the next available time. 5. Recurrent DVT/PE:  hypercoagulable state due to heterozygous factor V Leiden. Should remain on lifelong anticoagulation.  We will plan to continue anticoagulation at the time of device implantation. 6. Eliquis: Tolerated without bleeding problems.  Despite decreased renal function she still qualifies for full dose Eliquis in view of her age and body habitus. 7. HTN: Excellent control 8. OSA: Reports 100% compliance with CPAP.  Also reports benefit from this treatment.  She has a follow-up appointment with Dr. Elsworth Soho on November 15. 9. CKD: most recent creatinine 2.19.  Sees Dr. Moshe Cipro.  Baseline creatinine around 2.0. GFR approximately 25 10. Obesity: Still making progress with weight loss, she is determined to continue slowly losing weight.  We will have to constantly reassess her dry weight.  Her weight does slightly increase the risk of complications with defibrillator implantation. 11. DM: Reports good control.   Medication  Adjustments/Labs and Tests Ordered: Current medicines are reviewed at length with the patient today.  Concerns regarding medicines are outlined above.  Medication changes, Labs and Tests ordered today are listed in the Patient Instructions below. Patient Instructions   [facility] [place], [place]  [place], [place] [address]  Phone: [phone] Fax: [phone]  You are scheduled for an Implantable Cardioverter Defibrillator implant on Wednesday, October 30th, 2019 with Dr Sallyanne Kuster.  Please arrive at the [facility] ([place]) at 3:30 pm on the day of your procedure.  1. You may have a light, early breakfast the morning of your procedure. NOTHING TO EAT AFTER 8:00 AM. 2. Complete labwork the week before the procedure. There is a lab located within our office if we are convenient for you. You do not have to be fasting. 3. You may continue your current medications except: if you use insulin at night please take 1/2 dose the night before your procedure 4. Plan for an overnight stay. 5. Bring your insurance cards and a list of your current medications. 6. Wash your chest and neck with the surgical scrub provided the evening before and the morning of your procedure. Rinse well. Instructions have been provided.  * Special note:  Every effort is made to have your procedure done on time.  Occasionally there are emergencies that present themselves at the hospital that may cause delays.  Please be patient if a delay does occur.  If you have ANY questions after you get home, please call the office [phone].  Enid Cutter, CMA Dr Sallyanne Kuster      Signed, Sanda Klein, MD  03/08/2018 5:53 PM    [address], [place], [place]  [address] Phone: [phone]; Fax: [phone]    

## 2018-03-09 LAB — CBC
HEMATOCRIT: 30.3 % — AB (ref 34.0–46.6)
Hemoglobin: 9.9 g/dL — ABNORMAL LOW (ref 11.1–15.9)
MCH: 29.8 pg (ref 26.6–33.0)
MCHC: 32.7 g/dL (ref 31.5–35.7)
MCV: 91 fL (ref 79–97)
PLATELETS: 169 10*3/uL (ref 150–450)
RBC: 3.32 x10E6/uL — ABNORMAL LOW (ref 3.77–5.28)
RDW: 15.9 % — AB (ref 12.3–15.4)
WBC: 4.7 10*3/uL (ref 3.4–10.8)

## 2018-03-09 LAB — BASIC METABOLIC PANEL
BUN / CREAT RATIO: 16 (ref 12–28)
BUN: 42 mg/dL — ABNORMAL HIGH (ref 8–27)
CALCIUM: 9.6 mg/dL (ref 8.7–10.3)
CHLORIDE: 104 mmol/L (ref 96–106)
CO2: 21 mmol/L (ref 20–29)
Creatinine, Ser: 2.62 mg/dL — ABNORMAL HIGH (ref 0.57–1.00)
GFR calc non Af Amer: 18 mL/min/{1.73_m2} — ABNORMAL LOW (ref >59)
GFR, EST AFRICAN AMERICAN: 21 mL/min/{1.73_m2} — AB (ref >59)
Glucose: 92 mg/dL (ref 65–99)
POTASSIUM: 4 mmol/L (ref 3.5–5.2)
Sodium: 142 mmol/L (ref 134–144)

## 2018-03-09 LAB — PRO B NATRIURETIC PEPTIDE: NT-Pro BNP: 6869 pg/mL — ABNORMAL HIGH (ref 0–301)

## 2018-03-14 ENCOUNTER — Ambulatory Visit (HOSPITAL_COMMUNITY)
Admission: RE | Admit: 2018-03-14 | Discharge: 2018-03-15 | Disposition: A | Discharge status: Home / Self Care | Payer: Medicare HMO | Source: Ambulatory Visit | Attending: Cardiovascular Disease | Admitting: Cardiovascular Disease

## 2018-03-14 ENCOUNTER — Encounter (HOSPITAL_COMMUNITY)
Admission: RE | Disposition: A | Discharge status: Home / Self Care | Payer: Self-pay | Source: Ambulatory Visit | Attending: Cardiovascular Disease

## 2018-03-14 ENCOUNTER — Other Ambulatory Visit: Payer: Self-pay

## 2018-03-14 DIAGNOSIS — I130 Hypertensive heart and chronic kidney disease with heart failure and stage 1 through stage 4 chronic kidney disease, or unspecified chronic kidney disease: Secondary | ICD-10-CM | POA: Diagnosis not present

## 2018-03-14 DIAGNOSIS — Z86711 Personal history of pulmonary embolism: Secondary | ICD-10-CM | POA: Insufficient documentation

## 2018-03-14 DIAGNOSIS — Z794 Long term (current) use of insulin: Secondary | ICD-10-CM | POA: Insufficient documentation

## 2018-03-14 DIAGNOSIS — Z9889 Other specified postprocedural states: Secondary | ICD-10-CM | POA: Diagnosis not present

## 2018-03-14 DIAGNOSIS — Z853 Personal history of malignant neoplasm of breast: Secondary | ICD-10-CM | POA: Diagnosis not present

## 2018-03-14 DIAGNOSIS — I5042 Chronic combined systolic (congestive) and diastolic (congestive) heart failure: Secondary | ICD-10-CM | POA: Insufficient documentation

## 2018-03-14 DIAGNOSIS — E1122 Type 2 diabetes mellitus with diabetic chronic kidney disease: Secondary | ICD-10-CM | POA: Insufficient documentation

## 2018-03-14 DIAGNOSIS — R9431 Abnormal electrocardiogram [ECG] [EKG]: Secondary | ICD-10-CM | POA: Insufficient documentation

## 2018-03-14 DIAGNOSIS — Z9221 Personal history of antineoplastic chemotherapy: Secondary | ICD-10-CM | POA: Insufficient documentation

## 2018-03-14 DIAGNOSIS — E785 Hyperlipidemia, unspecified: Secondary | ICD-10-CM | POA: Insufficient documentation

## 2018-03-14 DIAGNOSIS — Z6837 Body mass index (BMI) 37.0-37.9, adult: Secondary | ICD-10-CM | POA: Insufficient documentation

## 2018-03-14 DIAGNOSIS — Z955 Presence of coronary angioplasty implant and graft: Secondary | ICD-10-CM | POA: Diagnosis not present

## 2018-03-14 DIAGNOSIS — Z006 Encounter for examination for normal comparison and control in clinical research program: Secondary | ICD-10-CM | POA: Diagnosis not present

## 2018-03-14 DIAGNOSIS — Z79899 Other long term (current) drug therapy: Secondary | ICD-10-CM | POA: Diagnosis not present

## 2018-03-14 DIAGNOSIS — Z86010 Personal history of colonic polyps: Secondary | ICD-10-CM | POA: Diagnosis not present

## 2018-03-14 DIAGNOSIS — D6851 Activated protein C resistance: Secondary | ICD-10-CM | POA: Diagnosis not present

## 2018-03-14 DIAGNOSIS — N184 Chronic kidney disease, stage 4 (severe): Secondary | ICD-10-CM | POA: Insufficient documentation

## 2018-03-14 DIAGNOSIS — Z9011 Acquired absence of right breast and nipple: Secondary | ICD-10-CM | POA: Insufficient documentation

## 2018-03-14 DIAGNOSIS — G4733 Obstructive sleep apnea (adult) (pediatric): Secondary | ICD-10-CM | POA: Insufficient documentation

## 2018-03-14 DIAGNOSIS — Z833 Family history of diabetes mellitus: Secondary | ICD-10-CM | POA: Diagnosis not present

## 2018-03-14 DIAGNOSIS — I428 Other cardiomyopathies: Secondary | ICD-10-CM | POA: Insufficient documentation

## 2018-03-14 DIAGNOSIS — Z8249 Family history of ischemic heart disease and other diseases of the circulatory system: Secondary | ICD-10-CM | POA: Diagnosis not present

## 2018-03-14 DIAGNOSIS — Z95810 Presence of automatic (implantable) cardiac defibrillator: Secondary | ICD-10-CM | POA: Diagnosis present

## 2018-03-14 DIAGNOSIS — Z7901 Long term (current) use of anticoagulants: Secondary | ICD-10-CM | POA: Insufficient documentation

## 2018-03-14 DIAGNOSIS — Z9189 Other specified personal risk factors, not elsewhere classified: Secondary | ICD-10-CM

## 2018-03-14 HISTORY — PX: ICD IMPLANT: EP1208

## 2018-03-14 LAB — GLUCOSE, CAPILLARY
GLUCOSE-CAPILLARY: 104 mg/dL — AB (ref 70–99)
GLUCOSE-CAPILLARY: 93 mg/dL (ref 70–99)
Glucose-Capillary: 145 mg/dL — ABNORMAL HIGH (ref 70–99)

## 2018-03-14 LAB — BASIC METABOLIC PANEL
Anion gap: 9 (ref 5–15)
BUN: 41 mg/dL — ABNORMAL HIGH (ref 8–23)
CHLORIDE: 105 mmol/L (ref 98–111)
CO2: 26 mmol/L (ref 22–32)
Calcium: 10.1 mg/dL (ref 8.9–10.3)
Creatinine, Ser: 2.49 mg/dL — ABNORMAL HIGH (ref 0.44–1.00)
GFR calc non Af Amer: 19 mL/min — ABNORMAL LOW (ref >60)
GFR, EST AFRICAN AMERICAN: 22 mL/min — AB (ref >60)
Glucose, Bld: 98 mg/dL (ref 70–99)
POTASSIUM: 4.1 mmol/L (ref 3.5–5.1)
SODIUM: 140 mmol/L (ref 135–145)

## 2018-03-14 LAB — SURGICAL PCR SCREEN
MRSA, PCR: NEGATIVE
Staphylococcus aureus: NEGATIVE

## 2018-03-14 SURGERY — ICD IMPLANT

## 2018-03-14 MED ORDER — SODIUM CHLORIDE 0.9% FLUSH: 3.0000 mL | INTRAVENOUS | Status: DC | PRN

## 2018-03-14 MED ORDER — INSULIN ASPART 100 UNIT/ML ~~LOC~~ SOLN
0.0000 [IU] | Freq: Three times a day (TID) | SUBCUTANEOUS | Status: DC
  Administered 2018-03-14: 1 [IU] via SUBCUTANEOUS

## 2018-03-14 MED ORDER — MIDAZOLAM HCL 5 MG/5ML IJ SOLN
INTRAMUSCULAR | Status: AC
  Filled 2018-03-14: qty 5

## 2018-03-14 MED ORDER — HYDRALAZINE HCL 10 MG PO TABS
10.0000 mg | ORAL_TABLET | Freq: Three times a day (TID) | ORAL | Status: DC
  Administered 2018-03-14 – 2018-03-15 (×3): 10 mg via ORAL
  Filled 2018-03-14 (×3): qty 1

## 2018-03-14 MED ORDER — ALLOPURINOL 100 MG PO TABS
100.0000 mg | ORAL_TABLET | Freq: Two times a day (BID) | ORAL | Status: DC
  Administered 2018-03-14 – 2018-03-15 (×2): 100 mg via ORAL
  Filled 2018-03-14 (×2): qty 1

## 2018-03-14 MED ORDER — INSULIN GLARGINE 100 UNIT/ML SOLOSTAR PEN: 15.0000 [IU] | PEN_INJECTOR | Freq: Every day | SUBCUTANEOUS | Status: DC | PRN

## 2018-03-14 MED ORDER — HEPARIN (PORCINE) IN NACL 1000-0.9 UT/500ML-% IV SOLN
INTRAVENOUS | Status: AC
  Filled 2018-03-14: qty 500

## 2018-03-14 MED ORDER — CEFAZOLIN SODIUM-DEXTROSE 2-4 GM/100ML-% IV SOLN
2.0000 g | INTRAVENOUS | Status: AC
  Administered 2018-03-14: 2 g via INTRAVENOUS
  Filled 2018-03-14: qty 1200

## 2018-03-14 MED ORDER — INSULIN GLARGINE 100 UNIT/ML ~~LOC~~ SOLN
15.0000 [IU] | Freq: Every day | SUBCUTANEOUS | Status: DC | PRN
  Filled 2018-03-14: qty 0.15

## 2018-03-14 MED ORDER — CEFAZOLIN SODIUM-DEXTROSE 2-4 GM/100ML-% IV SOLN
INTRAVENOUS | Status: AC
  Filled 2018-03-14: qty 100

## 2018-03-14 MED ORDER — LIDOCAINE HCL (PF) 1 % IJ SOLN
INTRAMUSCULAR | Status: DC | PRN
  Administered 2018-03-14: 55 mL

## 2018-03-14 MED ORDER — SODIUM CHLORIDE 0.9 % IV SOLN
250.0000 mL | INTRAVENOUS | Status: DC | PRN
  Administered 2018-03-14: 10 mL via INTRAVENOUS
  Administered 2018-03-15: 250 mL via INTRAVENOUS

## 2018-03-14 MED ORDER — HEPARIN (PORCINE) IN NACL 2-0.9 UNITS/ML
INTRAMUSCULAR | Status: AC | PRN
  Administered 2018-03-14: 500 mL

## 2018-03-14 MED ORDER — MUPIROCIN 2 % EX OINT
TOPICAL_OINTMENT | Freq: Once | CUTANEOUS | Status: AC
  Administered 2018-03-14: 1 via NASAL

## 2018-03-14 MED ORDER — COLCHICINE 0.6 MG PO TABS
0.6000 mg | ORAL_TABLET | Freq: Every day | ORAL | Status: DC
  Administered 2018-03-15: 0.6 mg via ORAL
  Filled 2018-03-14 (×2): qty 1

## 2018-03-14 MED ORDER — INSULIN LISPRO 100 UNIT/ML (KWIKPEN): 10.0000 [IU] | PEN_INJECTOR | Freq: Three times a day (TID) | SUBCUTANEOUS | Status: DC | PRN

## 2018-03-14 MED ORDER — FUROSEMIDE 40 MG PO TABS
40.0000 mg | ORAL_TABLET | Freq: Two times a day (BID) | ORAL | Status: DC
  Administered 2018-03-15: 40 mg via ORAL
  Filled 2018-03-14 (×2): qty 1

## 2018-03-14 MED ORDER — FENTANYL CITRATE (PF) 100 MCG/2ML IJ SOLN
INTRAMUSCULAR | Status: DC | PRN
  Administered 2018-03-14 (×2): 25 ug via INTRAVENOUS

## 2018-03-14 MED ORDER — ISOSORBIDE MONONITRATE ER 30 MG PO TB24
30.0000 mg | ORAL_TABLET | Freq: Every day | ORAL | Status: DC
  Administered 2018-03-15: 10:00:00 30 mg via ORAL
  Filled 2018-03-14 (×2): qty 1

## 2018-03-14 MED ORDER — SODIUM CHLORIDE 0.9 % IV SOLN
80.0000 mg | INTRAVENOUS | Status: AC
  Administered 2018-03-14: 80 mg
  Filled 2018-03-14: qty 2

## 2018-03-14 MED ORDER — LIDOCAINE HCL (PF) 1 % IJ SOLN
INTRAMUSCULAR | Status: AC
  Filled 2018-03-14: qty 60

## 2018-03-14 MED ORDER — CARVEDILOL 12.5 MG PO TABS
25.0000 mg | ORAL_TABLET | Freq: Two times a day (BID) | ORAL | Status: DC
  Administered 2018-03-14 – 2018-03-15 (×2): 25 mg via ORAL
  Filled 2018-03-14 (×2): qty 2

## 2018-03-14 MED ORDER — CHLORHEXIDINE GLUCONATE 4 % EX LIQD
60.0000 mL | Freq: Once | CUTANEOUS | Status: DC
  Filled 2018-03-14: qty 750

## 2018-03-14 MED ORDER — SODIUM CHLORIDE 0.9% FLUSH
3.0000 mL | Freq: Two times a day (BID) | INTRAVENOUS | Status: DC
  Administered 2018-03-14 – 2018-03-15 (×2): 3 mL via INTRAVENOUS

## 2018-03-14 MED ORDER — ACETAMINOPHEN 500 MG PO TABS
500.0000 mg | ORAL_TABLET | Freq: Four times a day (QID) | ORAL | Status: DC | PRN
  Administered 2018-03-14 – 2018-03-15 (×2): 500 mg via ORAL
  Filled 2018-03-14 (×2): qty 1

## 2018-03-14 MED ORDER — YOU HAVE A PACEMAKER BOOK
Freq: Once | Status: AC
  Administered 2018-03-14: 22:00:00
  Filled 2018-03-14: qty 1

## 2018-03-14 MED ORDER — FENTANYL CITRATE (PF) 100 MCG/2ML IJ SOLN
INTRAMUSCULAR | Status: AC
  Filled 2018-03-14: qty 2

## 2018-03-14 MED ORDER — SODIUM CHLORIDE 0.9 % IV SOLN
INTRAVENOUS | Status: DC
  Administered 2018-03-14: 14:00:00 via INTRAVENOUS

## 2018-03-14 MED ORDER — ROSUVASTATIN CALCIUM 10 MG PO TABS
10.0000 mg | ORAL_TABLET | Freq: Every day | ORAL | Status: DC
  Administered 2018-03-14: 10 mg via ORAL
  Filled 2018-03-14: qty 1

## 2018-03-14 MED ORDER — SODIUM CHLORIDE 0.9 % IV SOLN: INTRAVENOUS | Status: DC

## 2018-03-14 MED ORDER — SODIUM CHLORIDE 0.9 % IV SOLN
INTRAVENOUS | Status: AC
  Filled 2018-03-14: qty 2

## 2018-03-14 MED ORDER — CEFAZOLIN SODIUM-DEXTROSE 1-4 GM/50ML-% IV SOLN
1.0000 g | Freq: Four times a day (QID) | INTRAVENOUS | Status: AC
  Administered 2018-03-14 – 2018-03-15 (×3): 1 g via INTRAVENOUS
  Filled 2018-03-14 (×4): qty 50

## 2018-03-14 MED ORDER — MUPIROCIN 2 % EX OINT
TOPICAL_OINTMENT | CUTANEOUS | Status: AC
  Administered 2018-03-14: 1 via NASAL
  Filled 2018-03-14: qty 22

## 2018-03-14 MED ORDER — MIDAZOLAM HCL 5 MG/5ML IJ SOLN
INTRAMUSCULAR | Status: DC | PRN
  Administered 2018-03-14 (×2): 2 mg via INTRAVENOUS

## 2018-03-14 SURGICAL SUPPLY — 7 items
CABLE SURGICAL S-101-97-12 (CABLE) ×3 IMPLANT
ICD VISIA MRI VR DVFB1D4 (ICD Generator) IMPLANT
LEAD SPRINT QUAT SEC 6935M-55 (Lead) ×2 IMPLANT
PAD PRO RADIOLUCENT 2001M-C (PAD) ×3 IMPLANT
SHEATH CLASSIC 9F (SHEATH) ×2 IMPLANT
TRAY PACEMAKER INSERTION (PACKS) ×3 IMPLANT
VISIA MRI VR DVFB1D4 (ICD Generator) ×3 IMPLANT

## 2018-03-14 NOTE — Op Note (Signed)
Procedure report  Procedure performed: 1. Implantation of new single chamber cardioverter defibrillator 2. Fluoroscopy 3. Moderate sedation  Reason for procedure: Primary prevention of sudden cardiac death: Nonischemic cardiomyopathy,  left ventricular ejection fraction less than 35%, Heart failure NYHA class 2, on comprehensive medical therapy for over 90 days (SCD-HeFT)  Procedure performed by: Sanda Klein, MD  Complications: None  Estimated blood loss: <10 mL  Medications administered during procedure: Ancef 2 g intravenously Lidocaine 1% 30 mL locally,  Fentanyl 50 mcg intravenously Versed 4 mg intravenously  During this procedure the patient is administered a total of Versed 4 mg and Fentanyl 50 mcg to achieve and maintain moderate conscious sedation.  The patient's heart rate, blood pressure, and oxygen saturation are monitored continuously during the procedure. The period of conscious sedation is 43 minutes, of which I was present face-to-face 100% of this time.   Device details:  Generator Medtronic Visia AF MRI model [id number] serial number [id number] Right ventricular lead Medtronic [id number] serial number [id number]  Procedure details:  After the risks and benefits of the procedure were discussed the patient provided informed consent and was brought to the cardiac cath lab in the fasting state. The patient was prepped and draped in usual sterile fashion. Local anesthesia with 1% lidocaine was administered to to the left infraclavicular area. A 5-6 cm horizontal incision was made parallel with and 2-3 cm caudal to the left clavicle. Using electrocautery and blunt dissection a prepectoral pocket was created down to the level of the pectoralis major muscle fascia. The pocket was carefully inspected for hemostasis.   Under fluoroscopic guidance and using the modified Seldinger technique a single venipuncture was performed to access the left subclavian vein. No difficulty  was encountered accessing the vein.  A J-tip guidewire was subsequently exchanged for a 9.5 [place] safe sheaths.  Under fluoroscopic guidance the ventricular lead was advanced to level of the mid to apical right ventricular septum and thet active-fixation helix was deployed. Prominent current of injury was seen. Satisfactory pacing and sensing parameters were recorded. There was no evidence of diaphragmatic stimulation at maximum device output. The safe sheath was peeled away and the lead was secured in place with 2-0 silk.  The pocket was flushed with copious amounts of antibiotic solution. Reinspection showed excellent hemostasis.  The ventricular lead was connected to the generator and appropriate ventricular pacing was seen.  Repeat testing of the lead parameters via telemetry showed excellent values.  The entire system was then carefully inserted in the pocket with care been taking that the lead and device assumed a comfortable position without pressure on the incision. Great care was taken that the lead was located deep to the generator. The pocket was then closed in layers using 2 layers of 2-0 Vicryl and cutaneous staples, after which a sterile dressing was applied.  At the end of the procedure the following lead parameters were encountered:  sensed R waves >20 mV, impedance 418 ohms, threshold 0.5 V at 0.4 ms pulse width.  High voltage impedance 58 ohm.  Sanda Klein, MD, [facility] [phone] office [phone] pager

## 2018-03-14 NOTE — Interval H&P Note (Signed)
History and Physical Interval Note:  03/14/2018 5:43 PM  Patricia Morgan  has presented today for surgery, with the diagnosis of primary prevention - cm  The various methods of treatment have been discussed with the patient and family. After consideration of risks, benefits and other options for treatment, the patient has consented to  Procedure(s): ICD IMPLANT (N/A) as a surgical intervention .  The patient's history has been reviewed, patient examined, no change in status, stable for surgery.  I have reviewed the patient's chart and labs.  Questions were answered to the patient's satisfaction.     Kai Railsback

## 2018-03-14 NOTE — Progress Notes (Signed)
ICD Criteria  Current LVEF: 22%. Within 12 months prior to implant: Yes   Heart failure history: Yes, Class II  Cardiomyopathy history: Yes, Non-Ischemic Cardiomyopathy.  Atrial Fibrillation/Atrial Flutter: No.  Ventricular tachycardia history: No.  Cardiac arrest history: No.  History of syndromes with risk of sudden death: No.  Previous ICD: No.  Current ICD indication: Primary  PPM indication: No.  Class I or II Bradycardia indication present: No  Beta Blocker therapy for 3 or more months: Yes, prescribed.   Ace Inhibitor/ARB therapy for 3 or more months: No, medical reason. CKD 4. On hydralazine/nitrates.   I have seen Patricia Morgan is a 67 y.o. femalepre-procedural and has been referred for consideration of ICD implant for primary prevention of sudden death.  The patient's chart has been reviewed and they meet criteria for ICD implant.  I have had a thorough discussion with the patient reviewing options.  The patient and their family (if available) have had opportunities to ask questions and have them answered. The patient and I have decided together through the [place] Support Tool to implant single chamber ICD at this time.  Risks, benefits, alternatives to ICD implantation were discussed in detail with the patient today. The patient  understands that the risks include but are not limited to bleeding, infection, pneumothorax, perforation, tamponade, vascular damage, renal failure, MI, stroke, death, inappropriate shocks, and lead dislodgement and she wishes to proceed.

## 2018-03-15 ENCOUNTER — Other Ambulatory Visit: Payer: Medicare HMO

## 2018-03-15 ENCOUNTER — Encounter (HOSPITAL_COMMUNITY): Payer: Self-pay | Admitting: Cardiovascular Disease

## 2018-03-15 ENCOUNTER — Ambulatory Visit: Payer: Medicare HMO | Admitting: Hematology & Oncology

## 2018-03-15 ENCOUNTER — Ambulatory Visit (HOSPITAL_COMMUNITY): Payer: Medicare HMO

## 2018-03-15 DIAGNOSIS — I428 Other cardiomyopathies: Secondary | ICD-10-CM | POA: Diagnosis not present

## 2018-03-15 DIAGNOSIS — D6851 Activated protein C resistance: Secondary | ICD-10-CM | POA: Diagnosis not present

## 2018-03-15 DIAGNOSIS — E785 Hyperlipidemia, unspecified: Secondary | ICD-10-CM | POA: Diagnosis not present

## 2018-03-15 DIAGNOSIS — E1122 Type 2 diabetes mellitus with diabetic chronic kidney disease: Secondary | ICD-10-CM | POA: Diagnosis not present

## 2018-03-15 DIAGNOSIS — N184 Chronic kidney disease, stage 4 (severe): Secondary | ICD-10-CM | POA: Diagnosis not present

## 2018-03-15 DIAGNOSIS — Z006 Encounter for examination for normal comparison and control in clinical research program: Secondary | ICD-10-CM | POA: Diagnosis not present

## 2018-03-15 DIAGNOSIS — Z4682 Encounter for fitting and adjustment of non-vascular catheter: Secondary | ICD-10-CM | POA: Diagnosis not present

## 2018-03-15 DIAGNOSIS — I5042 Chronic combined systolic (congestive) and diastolic (congestive) heart failure: Secondary | ICD-10-CM | POA: Diagnosis not present

## 2018-03-15 DIAGNOSIS — I130 Hypertensive heart and chronic kidney disease with heart failure and stage 1 through stage 4 chronic kidney disease, or unspecified chronic kidney disease: Secondary | ICD-10-CM | POA: Diagnosis not present

## 2018-03-15 LAB — URINALYSIS, COMPLETE (UACMP) WITH MICROSCOPIC
Bilirubin Urine: NEGATIVE
Glucose, UA: NEGATIVE mg/dL
HGB URINE DIPSTICK: NEGATIVE
Ketones, ur: NEGATIVE mg/dL
LEUKOCYTES UA: NEGATIVE
NITRITE: NEGATIVE
Protein, ur: NEGATIVE mg/dL
SPECIFIC GRAVITY, URINE: 1.014 (ref 1.005–1.030)
pH: 5 (ref 5.0–8.0)

## 2018-03-15 LAB — GLUCOSE, CAPILLARY
GLUCOSE-CAPILLARY: 110 mg/dL — AB (ref 70–99)
Glucose-Capillary: 116 mg/dL — ABNORMAL HIGH (ref 70–99)

## 2018-03-15 NOTE — Progress Notes (Signed)
Progress Note  Patient Name: Lakie Mclouth Date of Encounter: 03/15/2018  Primary Cardiologist: Evelina Lore  Subjective   A little sore, resolved after taking off the pressure dressing. Some dysuria (she reports vaginal yeast infections whenever she takes antibiotics).  Inpatient Medications    Scheduled Meds: . allopurinol  100 mg Oral BID  . carvedilol  25 mg Oral BID  . colchicine  0.6 mg Oral Daily  . furosemide  40 mg Oral BID  . hydrALAZINE  10 mg Oral TID  . insulin aspart  0-9 Units Subcutaneous TID WC  . isosorbide mononitrate  30 mg Oral Daily  . rosuvastatin  10 mg Oral Q2200  . sodium chloride flush  3 mL Intravenous Q12H   Continuous Infusions: . sodium chloride Stopped (03/15/18 0431)  .  ceFAZolin (ANCEF) IV 1 g (03/15/18 0831)   PRN Meds: sodium chloride, acetaminophen, insulin glargine, sodium chloride flush   Vital Signs    Vitals:   03/14/18 2209 03/15/18 0237 03/15/18 0509 03/15/18 0756  BP: (!) 130/59 (!) 150/77  (!) 144/63  Pulse:  77  73  Resp:  (!) 21 (!) 25 (!) 24  Temp:  97.7 F (36.5 C)  98.3 F (36.8 C)  TempSrc:  Oral  Oral  SpO2:  96%  96%  Weight:  88.5 kg    Height:        Intake/Output Summary (Last 24 hours) at 03/15/2018 0849 Last data filed at 03/15/2018 0511 Gross per 24 hour  Intake 475.94 ml  Output 100 ml  Net 375.94 ml   Filed Weights   03/14/18 1321 03/15/18 0237  Weight: 89.4 kg 88.5 kg    Telemetry    SR with frequent PACs - Personally Reviewed  ECG    SR, PACs, LVH - Personally Reviewed  Physical Exam  Healthy surgical site with minima oozing, no hematoma, no sign of infection. GEN: No acute distress.   Neck: No JVD Cardiac: RRR, no murmurs, rubs, or gallops.  Respiratory: Clear to auscultation bilaterally. GI: Soft, nontender, non-distended  MS: No edema; No deformity. Neuro:  Nonfocal  Psych: Normal affect   Labs    Chemistry Recent Labs  Lab 03/08/18 1640 03/14/18 1346  NA 142 140    K 4.0 4.1  CL 104 105  CO2 21 26  GLUCOSE 92 98  BUN 42* 41*  CREATININE 2.62* 2.49*  CALCIUM 9.6 10.1  GFRNONAA 18* 19*  GFRAA 21* 22*  ANIONGAP  --  9     Hematology Recent Labs  Lab 03/08/18 1640  WBC 4.7  RBC 3.32*  HGB 9.9*  HCT 30.3*  MCV 91  MCH 29.8  MCHC 32.7  RDW 15.9*  PLT 169    Cardiac EnzymesNo results for input(s): TROPONINI in the last 168 hours. No results for input(s): TROPIPOC in the last 168 hours.   BNP Recent Labs  Lab 03/08/18 1640  PROBNP 6,869*     DDimer No results for input(s): DDIMER in the last 168 hours.   Radiology    Dg Chest 2 View  Result Date: 03/15/2018 CLINICAL DATA:  Status post ICD placement. EXAM: CHEST - 2 VIEW COMPARISON:  01/05/2018 FINDINGS: Left anterior chest wall are ICD has its single lead projecting in the right ventricle. Cardiac silhouette is mildly enlarged. No mediastinal or hilar masses. There are stable prominent bronchovascular markings. No evidence of pneumonia or pulmonary edema. No pleural effusion. No pneumothorax. Stable changes from prior right breast surgery. IMPRESSION: 1. Well-positioned  single lead left anterior chest wall ICD. 2. No pneumothorax.  No acute cardiopulmonary disease. Electronically Signed   By: Lajean Manes M.D.   On: 03/15/2018 07:52    Cardiac Studies   Device check R waves 20 mV, impedance 418 ohm, threshold 0.5@0 .4 ms  Patient Profile     67 y.o. female s/p uncomplicated ICD implantation for primary prevention for non ischemic cardiomyopathy (left ventricular ejection fraction 22%, heart failure NYHA class II, on comprehensive medical therapy), also has DM and severe CKD (stage 4).   Assessment & Plan    DC home. Reviewed wound care, activity restrictions, wound check appt 10-14 days, "shock plan". Keep appt w me January 23.  For questions or updates, please contact [place] Please consult [email or web address] for contact info under        Signed, Sanda Klein, MD   03/15/2018, 8:49 AM

## 2018-03-15 NOTE — Progress Notes (Signed)
Patient states that she has a history of UTI with antibiotic use, and feels that she currently may have a UTI, reporting burning with urination.

## 2018-03-15 NOTE — Discharge Summary (Signed)
Discharge Summary    Patient ID: Patricia Morgan,  MRN: [id number], DOB/AGE: Oct 04, 1950 67 y.o.  Admit date: 03/14/2018 Discharge date: 03/15/2018  Primary Care Provider: Antony Contras Primary Cardiologist: Dr. Sallyanne Kuster  Discharge Diagnoses    Principal Problem:   At risk for sudden cardiac death Active Problems:   Non-ischemic cardiomyopathy (HCC)   Chronic combined systolic and diastolic CHF (congestive heart failure) (HCC)   Long QT interval   ICD (implantable cardioverter-defibrillator) in place  Allergies No Known Allergies  Diagnostic Studies/Procedures    ICD:03/14/18  Procedure performed: 1. Implantation of new single chamber cardioverter defibrillator 2. Fluoroscopy 3. Moderate sedation  Reason for procedure: Primary prevention of sudden cardiac death: Nonischemic cardiomyopathy,  left ventricular ejection fraction less than 35%, Heart failure NYHA class 2, on comprehensive medical therapy for over 90 days (SCD-HeFT).  Device details:  Generator Medtronic Visia AF MRI model [id number] serial number [id number] Right ventricular lead Medtronic [id number] serial number [id number] _____________   History of Present Illness     67 y.o. 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  She had some problems with fluid retention and heart failure exacerbation earlier in the month.  After an increase in her dose of diuretics she felt much better.  She no longer had any problems with exertional dyspnea and had no edema.  An echocardiogram was ordered and shows worsening of left ventricular systolic function with an ejection fraction that is now down to about 25%.  A nuclear stress test was performed and  showed a fixed inferior wall defect that was felt to be diaphragmatic attenuation artifact.  There was no reversible ischemia.  A similar EF of 22% was reported.  Reported being compliant with carvedilol in the maximum usual dose and also takes hydralazine/nitrates at the highest dose tolerated by her blood pressure. She was not receiving RAAS inhibitors due to chronic kidney disease.  Coronary angiography performed June 2017 showed no evidence of coronary artery disease but confirmed elevated filling pressure. PA pressure was 57/18 in the setting of a mean pulmonary wedge pressure of 33 mmHg. Echo showed ejection fraction of 45%. Nuclear scintigraphy calculated ejection fraction of 38%. It also suggested the presence of perfusion abnormalities, false positive by coronary angiography. She sees Dr. Moshe Cipro for chronic kidney disease and her baseline creatinine is around 2.0-2.5. Her primary care physician is Dr. Antony Contras. Dr. Elsworth Soho manages her sleep apnea treatment.  Her sister Phineas Semen has a history of coronary artery disease and long QT syndrome. Given her risk for SCD she was referred for ICD placement.   Hospital Course   Underwent ICD placement with Medtronic right ventricular lead device without complication. Did have some bleeding at the site which resolved with pressure dressing that was later removed. Device check the following day noted R waves 20 mV, impedance 418 ohm, threshold 0.5@0 .4 ms. Instructions/precautions regarding site care given prior to discharge. CXR post procedure stable without hematoma. Planned to resume Eliquis on the day of discharge.   Patricia Morgan was seen by Dr. Sallyanne Kuster and determined stable for discharge home. Follow up in the office has been arranged. Medications are listed below.   _____________  Discharge Vitals Blood pressure (!) 144/63, pulse 73, temperature 98.3 F (36.8 C), temperature source Oral, resp. rate (!) 24, height 5\' 1"  (1.549 m), weight  88.5 kg, SpO2 96 %.  Filed Weights   03/14/18 1321 03/15/18 0237  Weight: 89.4 kg 88.5 kg    Labs & Radiologic Studies    CBC No results for input(s): WBC, NEUTROABS, HGB, HCT, MCV, PLT in the last 72 hours. Basic Metabolic Panel Recent Labs    03/14/18 1346  NA 140  K 4.1  CL 105  CO2 26  GLUCOSE 98  BUN 41*  CREATININE 2.49*  CALCIUM 10.1   Liver Function Tests No results for input(s): AST, ALT, ALKPHOS, BILITOT, PROT, ALBUMIN in the last 72 hours. No results for input(s): LIPASE, AMYLASE in the last 72 hours. Cardiac Enzymes No results for input(s): CKTOTAL, CKMB, CKMBINDEX, TROPONINI in the last 72 hours. BNP Invalid input(s): POCBNP D-Dimer No results for input(s): DDIMER in the last 72 hours. Hemoglobin A1C No results for input(s): HGBA1C in the last 72 hours. Fasting Lipid Panel No results for input(s): CHOL, HDL, LDLCALC, TRIG, CHOLHDL, LDLDIRECT in the last 72 hours. Thyroid Function Tests No results for input(s): TSH, T4TOTAL, T3FREE, THYROIDAB in the last 72 hours.  Invalid input(s): FREET3 _____________  Dg Chest 2 View  Result Date: 03/15/2018 CLINICAL DATA:  Status post ICD placement. EXAM: CHEST - 2 VIEW COMPARISON:  01/05/2018 FINDINGS: Left anterior chest wall are ICD has its single lead projecting in the right ventricle. Cardiac silhouette is mildly enlarged. No mediastinal or hilar masses. There are stable prominent bronchovascular markings. No evidence of pneumonia or pulmonary edema. No pleural effusion. No pneumothorax. Stable changes from prior right breast surgery. IMPRESSION: 1. Well-positioned single lead left anterior chest wall ICD. 2. No pneumothorax.  No acute cardiopulmonary disease. Electronically Signed   By: Lajean Manes M.D.   On: 03/15/2018 07:52   Disposition   Pt is being discharged home today in good condition.  Follow-up Plans & Appointments    Follow-up Information    Croitoru, Mihai, MD Follow up on 06/07/2018.     Specialty:  Cardiology Why:  at 9:40am for your follow up appt.  Contact information: [address] [place] [address] [phone]        [facility] Follow up on 03/27/2018.   Why:  at 2pm for your follow up appt.  Contact information: [place] [phone]         Discharge Instructions    Call MD for:  redness, tenderness, or signs of infection (pain, swelling, redness, odor or green/yellow discharge around incision site)   Complete by:  As directed    Diet - low sodium heart healthy   Complete by:  As directed    Increase activity slowly   Complete by:  As directed      Discharge Medications     Medication List    TAKE these medications   acetaminophen 500 MG tablet Commonly known as:  TYLENOL Take 500 mg by mouth every 6 (six) hours as needed for mild pain.   allopurinol 100 MG tablet Commonly known as:  ZYLOPRIM Take 100 mg by mouth 2 (two) times daily.   carvedilol 25 MG tablet Commonly known as:  COREG TAKE 1 TABLET (25 MG TOTAL) BY MOUTH 2 (TWO) TIMES DAILY WITH MEALS What changed:  See the new instructions.   colchicine 0.6 MG tablet Take 0.6 mg by mouth daily.   ELIQUIS 5 MG Tabs tablet Generic drug:  apixaban TAKE 1 TABLET TWICE DAILY What changed:  how much to take  additional instructions   furosemide 40 MG tablet Commonly known as:  LASIX Take 2 tablets (80 mg total) by mouth 2 (two) times daily. Take 80 mg in the morning and 40 mg in the evening What changed:    how much to take  when to take this  additional instructions   HUMALOG KWIKPEN 100 UNIT/ML KiwkPen Generic drug:  insulin lispro Inject 10 Units into the skin 3 (three) times daily as needed (for blood sugar greater than 200.).   hydrALAZINE 10 MG tablet Commonly known as:  APRESOLINE TAKE 1 TABLET (10 MG) BY MOUTH THREE TIMES DAILY What  changed:    how much to take  how to take this  when to take this  additional instructions   isosorbide mononitrate 30 MG 24 hr tablet Commonly known as:  IMDUR TAKE 1 TABLET (30 MG) BY MOUTH DAILY What changed:    how much to take  how to take this  when to take this  additional instructions   LANTUS SOLOSTAR 100 UNIT/ML Solostar Pen Generic drug:  Insulin Glargine Inject 15 Units into the skin daily as needed (for blood sugar greater than 200.). Per sliding scale.   PROBIOTIC PO Take 1 capsule by mouth daily.   rosuvastatin 10 MG tablet Commonly known as:  CRESTOR TAKE 1 TABLET EVERY DAY What changed:    when to take this  additional instructions   TRUE METRIX BLOOD GLUCOSE TEST test strip Generic drug:  glucose blood   vitamin B-12 1000 MCG tablet Commonly known as:  CYANOCOBALAMIN Take 1,000 mcg by mouth daily.   Vitamin D-3 5000 units Tabs Take 5,000 Units by mouth every Sunday.       Acute coronary syndrome (MI, NSTEMI, STEMI, etc) this admission?: No.     Outstanding Labs/Studies   N/a   Duration of Discharge Encounter   Greater than 30 minutes including physician time.  Signed, Reino Bellis NP-C 03/15/2018, 9:43 AM

## 2018-03-15 NOTE — Discharge Instructions (Signed)

## 2018-03-16 DIAGNOSIS — D649 Anemia, unspecified: Secondary | ICD-10-CM | POA: Diagnosis not present

## 2018-03-27 ENCOUNTER — Ambulatory Visit: Payer: Medicare HMO

## 2018-03-28 DIAGNOSIS — D649 Anemia, unspecified: Secondary | ICD-10-CM | POA: Diagnosis not present

## 2018-03-29 ENCOUNTER — Ambulatory Visit (INDEPENDENT_AMBULATORY_CARE_PROVIDER_SITE_OTHER): Payer: Medicare HMO | Admitting: *Deleted

## 2018-03-29 DIAGNOSIS — I428 Other cardiomyopathies: Secondary | ICD-10-CM | POA: Diagnosis not present

## 2018-03-29 DIAGNOSIS — R9431 Abnormal electrocardiogram [ECG] [EKG]: Secondary | ICD-10-CM

## 2018-03-29 LAB — CUP PACEART INCLINIC DEVICE CHECK
Battery Remaining Longevity: 137 mo
Date Time Interrogation Session: 20191114130106
HighPow Impedance: 57 Ohm
Implantable Lead Implant Date: 20191030
Implantable Lead Location: 753860
Implantable Pulse Generator Implant Date: 20191030
Lead Channel Impedance Value: 456 Ohm
Lead Channel Pacing Threshold Amplitude: 0.75 V
Lead Channel Pacing Threshold Pulse Width: 0.4 ms
Lead Channel Setting Sensing Sensitivity: 0.3 mV
MDC IDC MSMT BATTERY VOLTAGE: 3.18 V
MDC IDC MSMT LEADCHNL RV IMPEDANCE VALUE: 361 Ohm
MDC IDC MSMT LEADCHNL RV SENSING INTR AMPL: 21.875 mV
MDC IDC SET LEADCHNL RV PACING AMPLITUDE: 3.5 V
MDC IDC SET LEADCHNL RV PACING PULSEWIDTH: 0.4 ms
MDC IDC STAT BRADY RV PERCENT PACED: 0.01 %

## 2018-03-29 NOTE — Progress Notes (Signed)
Wound check appointment. Staples removed. Wound without redness or edema. Incision edges approximated, wound well healed. Normal device function. Threshold, sensing, and impedance consistent with implant measurements. Device programmed at 3.5V for extra safety margin until 3 month visit. Histogram distribution appropriate for patient and level of activity. No ventricular arrhythmias noted. Patient educated about wound care, arm mobility, lifting restrictions, shock plan. ROV with [facility] 06/20/2018

## 2018-03-30 ENCOUNTER — Other Ambulatory Visit: Payer: Medicare HMO

## 2018-03-30 ENCOUNTER — Ambulatory Visit (INDEPENDENT_AMBULATORY_CARE_PROVIDER_SITE_OTHER): Payer: Medicare HMO | Admitting: Pulmonary Disease

## 2018-03-30 ENCOUNTER — Encounter: Payer: Self-pay | Admitting: Pulmonary Disease

## 2018-03-30 DIAGNOSIS — G4733 Obstructive sleep apnea (adult) (pediatric): Secondary | ICD-10-CM

## 2018-03-30 DIAGNOSIS — J849 Interstitial pulmonary disease, unspecified: Secondary | ICD-10-CM

## 2018-03-30 NOTE — Progress Notes (Signed)
Subjective:    Patient ID: Patricia Morgan, female    DOB: 1951/05/09, 67 y.o.   MRN: [id number]  HPI  67 year old woman with nonischemic cardiomyopathy whom I see for OSA is now referred for evaluation of abnormal PFTs.  She developed shortness of breath and worsening heart failure in October and underwent repeat echo that showed that her EF had dropped from 40% to 35%. She underwent myocardial stress testing which showed an inferior wall defect which was related to diaphragmatic shadow.  She subsequently underwent placement of an AICD  PFTs were obtained which I reviewed from 02/23/2018-this showed no evidence of airway obstruction with ratio of 88, FEV1 of 82%.  There was moderate restriction with FVC of 72% and DLCO 64%.  This appears to be intraparenchymal with DLCO 45% that corrected to 76% for alveolar volume.  Surprisingly she does not report much dyspnea or wheezing.  She does admit to a sedentary lifestyle and is limited in her walking due to pain in both knees.  She does not climb stairs. She denies cough.  Pedal edema has resolved with Lasix.  She reports 2 pillow orthopnea  Regarding her sleep apnea, CPAP is certainly helped improve her daytime somnolence and fatigue.  She is using a nasal mask but wonders if she should try to pillows. Download was reviewed which shows good control of events on CPAP 11 cm with large leak and average compliance of 5 hours on days used   PMH - breast CA 1995  nonischemic cardiomyopathy ( ? Adriamycin induced) and recurrent VTe with her last episode of DVT in 08/2015, CKD -3   Review of labs shows positive rheumatoid arthritis factor in 08/2015. Serial imaging was reviewed which all show prominent interstitial infiltrates Environmental history was obtained, she does not have any occupational exposure, she has lived in the senior living facility for the past 6 years  Significant tests/ events reviewed  CT chest 08/2015 shows centrilobular  groundglass infiltrates diffusely  NPSG 2006:  AHI 16/hr Auto titration 2012:  Optimal pressure 12cm  MSLT 11/2011 >> SOREM x 1     Past Medical History:  Diagnosis Date  . ANEMIA-NOS   . BREAST CANCER, HX OF 1994 & 1995   1994>>recurrence in 1995, R breast, s/p lumpectomy>>mastectomy  . Cancer ([place])   . CHF (congestive heart failure) ([place])   . CHRONIC KIDNEY DISEASE STAGE III (MODERATE)   . Clotting disorder ([place])    on eliquis for Factor 5 disorder  . Colon polyps   . DIABETES MELLITUS, TYPE II   . Diverticulosis   . HYPERLIPIDEMIA   . HYPERTENSION   . OBSTRUCTIVE SLEEP APNEA    CPAP  . Personal history of chemotherapy   . Proteinuria   . PULMONARY EMBOLISM 09/24/2008   anticoag thru 03/2010  . Sleep apnea    Wears CPAP nightly    Past Surgical History:  Procedure Laterality Date  . BREAST SURGERY    . CARDIAC CATHETERIZATION N/A 11/02/2015   Procedure: Right/Left Heart Cath and Coronary Angiography;  Surgeon: Burnell Blanks, MD;  Location: [place] [facility];  Service: Cardiovascular;  Laterality: N/A;  . ICD IMPLANT N/A 03/14/2018   Procedure: ICD IMPLANT;  Surgeon: Sanda Klein, MD;  Location: [place] [facility];  Service: Cardiovascular;  Laterality: N/A;  . insertion port a cath    . MASTECTOMY Right 1995   Right  . PORT-A-CATH REMOVAL      No Known Allergies  Social History  Socioeconomic History  . Marital status: Divorced    Spouse name: Not on file  . Number of children: 2  . Years of education: 45  . Highest education level: Not on file  Occupational History  . Occupation: retired  Scientific laboratory technician  . Financial resource strain: Not on file  . Food insecurity:    Worry: Not on file    Inability: Not on file  . Transportation needs:    Medical: Not on file    Non-medical: Not on file  Tobacco Use  . Smoking status: Never Smoker  . Smokeless tobacco: Never Used  . Tobacco comment: Lives alone-divorced  Substance and Sexual  Activity  . Alcohol use: No    Alcohol/week: 0.0 standard drinks  . Drug use: No  . Sexual activity: Not on file  Lifestyle  . Physical activity:    Days per week: Not on file    Minutes per session: Not on file  . Stress: Not on file  Relationships  . Social connections:    Talks on phone: Not on file    Gets together: Not on file    Attends religious service: Not on file    Active member of club or organization: Not on file    Attends meetings of clubs or organizations: Not on file    Relationship status: Not on file  . Intimate partner violence:    Fear of current or ex partner: Not on file    Emotionally abused: Not on file    Physically abused: Not on file    Forced sexual activity: Not on file  Other Topics Concern  . Not on file  Social History Narrative  . Not on file     Family History  Problem Relation Age of Onset  . Parkinson's disease Mother   . Asthma Father   . Diabetes Sister   . Colon cancer Neg Hx   . Stomach cancer Neg Hx   . Esophageal cancer Neg Hx   . Rectal cancer Neg Hx   . Liver cancer Neg Hx      Review of Systems Pedal edema has resolved, rest as above  Constitutional: negative for anorexia, fevers and sweats  Eyes: negative for irritation, redness and visual disturbance  Ears, nose, mouth, throat, and face: negative for earaches, epistaxis, nasal congestion and sore throat  Respiratory: negative for cough, dyspnea on exertion, sputum and wheezing  Cardiovascular: negative for chest pain, dyspnea, lower extremity edema, orthopnea, palpitations and syncope  Gastrointestinal: negative for abdominal pain, constipation, diarrhea, melena, nausea and vomiting  Genitourinary:negative for dysuria, frequency and hematuria  Hematologic/lymphatic: negative for bleeding, easy bruising and lymphadenopathy  Musculoskeletal:negative for arthralgias, muscle weakness and stiff joints  Neurological: negative for coordination problems, gait problems,  headaches and weakness  Endocrine: negative for diabetic symptoms including polydipsia, polyuria and weight loss     Objective:   Physical Exam  Gen. Pleasant, obese, in no distress ENT - no lesions, no post nasal drip Neck: No JVD, no thyromegaly, no carotid bruits Lungs: no use of accessory muscles, no dullness to percussion, fine rales rt base, no rhonchi  Cardiovascular: Rhythm regular, heart sounds  normal, no murmurs or gallops, no peripheral edema Musculoskeletal: No deformities, no cyanosis or clubbing , no tremors       Assessment & Plan:

## 2018-03-30 NOTE — Assessment & Plan Note (Signed)
CPAP appears to be working well on current settings of 11 cm. We will provide her with nasal pillows and hopefully this will improve her compliance.  If leak persists then it was probably a mild leak and she will need a full facemask  Weight loss encouraged, compliance with goal of at least 4-6 hrs every night is the expectation. Advised against medications with sedative side effects Cautioned against driving when sleepy - understanding that sleepiness will vary on a day to day basis

## 2018-03-30 NOTE — Assessment & Plan Note (Signed)
There is diffuse centrilobular groundglass infiltrates noted on prior CT and persistent interstitial markings noted on serial chest x-ray.  Differential diagnosis here includes hypersensitivity pneumonitis versus collagen vascular disease. We will obtain high-resolution CT chest without contrast. We will also obtain basic serology including ENA, CCP and ACE level

## 2018-03-30 NOTE — Patient Instructions (Signed)
High-resolution CT scan of the chest.  Blood work today.  Trial of nasal pillows

## 2018-04-02 ENCOUNTER — Inpatient Hospital Stay: Payer: Medicare HMO | Attending: Hematology & Oncology

## 2018-04-02 ENCOUNTER — Inpatient Hospital Stay: Payer: Medicare HMO | Admitting: Hematology & Oncology

## 2018-04-02 ENCOUNTER — Ambulatory Visit: Payer: Medicare HMO

## 2018-04-02 LAB — ANGIOTENSIN CONVERTING ENZYME: ANGIOTENSIN-CONVERTING ENZYME: 27 U/L (ref 9–67)

## 2018-04-03 LAB — ANA+ENA+DNA/DS+SCL 70+SJOSSA/B
ANA Titer 1: NEGATIVE
ENA SM Ab Ser-aCnc: <0.2 AI (ref 0.0–0.9)
ENA SSB (LA) Ab: <0.2 AI (ref 0.0–0.9)
Scleroderma SCL-70: 0.3 AI (ref 0.0–0.9)

## 2018-04-04 ENCOUNTER — Ambulatory Visit (HOSPITAL_BASED_OUTPATIENT_CLINIC_OR_DEPARTMENT_OTHER)
Admission: RE | Admit: 2018-04-04 | Discharge: 2018-04-04 | Disposition: A | Discharge status: Home / Self Care | Payer: Medicare HMO | Source: Ambulatory Visit | Attending: Pulmonary Disease | Admitting: Pulmonary Disease

## 2018-04-04 DIAGNOSIS — J849 Interstitial pulmonary disease, unspecified: Secondary | ICD-10-CM | POA: Insufficient documentation

## 2018-04-04 DIAGNOSIS — K769 Liver disease, unspecified: Secondary | ICD-10-CM | POA: Insufficient documentation

## 2018-04-04 DIAGNOSIS — I700 Atherosclerosis of aorta: Secondary | ICD-10-CM | POA: Insufficient documentation

## 2018-04-04 DIAGNOSIS — R590 Localized enlarged lymph nodes: Secondary | ICD-10-CM | POA: Diagnosis not present

## 2018-04-04 DIAGNOSIS — R918 Other nonspecific abnormal finding of lung field: Secondary | ICD-10-CM | POA: Diagnosis not present

## 2018-04-04 DIAGNOSIS — I2510 Atherosclerotic heart disease of native coronary artery without angina pectoris: Secondary | ICD-10-CM | POA: Diagnosis not present

## 2018-04-04 DIAGNOSIS — D649 Anemia, unspecified: Secondary | ICD-10-CM | POA: Diagnosis not present

## 2018-04-05 ENCOUNTER — Telehealth: Payer: Self-pay

## 2018-04-05 NOTE — Telephone Encounter (Signed)
Left message for patient to call back to get scheduled.  

## 2018-04-05 NOTE — Telephone Encounter (Signed)
-----   Message from Rigoberto Noel, MD sent at 04/05/2018  9:08 AM EST ----- Can we get her earlier appt with me please to discuss?  RA ----- Message ----- From: Interface, Rad Results In Sent: 04/04/2018   2:50 PM EST To: Rigoberto Noel, MD

## 2018-04-06 NOTE — Telephone Encounter (Signed)
Pt is calling back [phone]

## 2018-04-06 NOTE — Telephone Encounter (Signed)
Spoke with patient. Advised her that RA would like for her to be seen earlier than her 12/16 appt. She has been scheduled for 12/9 at 2pm. I will cancel the later appt. Nothing further needed at time of call.

## 2018-04-19 ENCOUNTER — Other Ambulatory Visit: Payer: Self-pay | Admitting: *Deleted

## 2018-04-19 DIAGNOSIS — I2699 Other pulmonary embolism without acute cor pulmonale: Secondary | ICD-10-CM

## 2018-04-19 MED ORDER — APIXABAN 5 MG PO TABS: 5.0000 mg | ORAL_TABLET | Freq: Two times a day (BID) | ORAL | 6 refills | Status: DC

## 2018-04-23 ENCOUNTER — Encounter: Payer: Self-pay | Admitting: Pulmonary Disease

## 2018-04-23 ENCOUNTER — Ambulatory Visit (INDEPENDENT_AMBULATORY_CARE_PROVIDER_SITE_OTHER): Payer: Medicare HMO | Admitting: Pulmonary Disease

## 2018-04-23 DIAGNOSIS — J849 Interstitial pulmonary disease, unspecified: Secondary | ICD-10-CM | POA: Diagnosis not present

## 2018-04-23 DIAGNOSIS — G4733 Obstructive sleep apnea (adult) (pediatric): Secondary | ICD-10-CM | POA: Diagnosis not present

## 2018-04-23 NOTE — Assessment & Plan Note (Signed)
Weight loss encouraged, compliance with goal of at least 4-6 hrs every night is the expectation. Advised against medications with sedative side effects Cautioned against driving when sleepy - understanding that sleepiness will vary on a day to day basis  

## 2018-04-23 NOTE — Patient Instructions (Signed)
We discussed possibility of sarcoidosis causing some degree of inflammation and fibrosis in the lungs.  Lymph nodes are mildly enlarged but have calcium in them indicating that they have been there for a long time  Repeat high-resolution CT chest without contrast in 6 months. Repeat PFTs at next visit in 6 months

## 2018-04-23 NOTE — Assessment & Plan Note (Signed)
We discussed possibility of sarcoidosis causing some degree of inflammation and fibrosis in the lungs.  Lymph nodes are mildly enlarged but have calcium in them indicating that they have been there for a long time  Repeat high-resolution CT chest without contrast in 6 months. Repeat PFTs at next visit in 6 months  We discussed possible bronchoscopy with transtracheal biopsy aggressive benefits but decided to hold off  Greater than 50% time was spent in counseling and coordination of care with the patient

## 2018-04-23 NOTE — Progress Notes (Signed)
   Subjective:    Patient ID: Patricia Morgan, female    DOB: 1950-07-13, 67 y.o.   MRN: [id number]  HPI  67 year old woman with nonischemic cardiomyopathy for FU of  OSA & ILD  She developed shortness of breath and worsening heart failure in 02/2018  and underwent repeat echo that showed that her EF had dropped from 40% to 35%. She underwent myocardial stress testing which showed an inferior wall defect which was related to diaphragmatic shadow.  She  underwent placement of an AICD.  She is now on Eliquis  Review of labs shows positive rheumatoid arthritis factor in 08/2015.  We reviewed HRCT findings today in great detail   Significant tests/ events reviewed HRCT 03/2018 >>Interstitial thickening and nodularity appear slightly more organized when compared with 08/30/2015. Findings are upper and midlung zone predominant , borderline enlarged/calcified mediastinal/hilar lymph nodes>> favor sarcoid   PFTs 02/23/2018-this showed no evidence of airway obstruction with ratio of 88, FEV1 of 82%.  There was moderate restriction with FVC of 72% and DLCO 64%.  This appears to be intraparenchymal with DLCO 45% that corrected to 76% for alveolar volume.  CT chest 08/2015 shows centrilobular groundglass infiltrates diffusely  NPSG 2006: AHI 16/hr Auto titration 2012: Optimal pressure 12cm  MSLT 11/2011 >> SOREM x 1  Serology 03/2018 ANA neg, ACE nml,    Review of Systems neg for any significant sore throat, dysphagia, itching, sneezing, nasal congestion or excess/ purulent secretions, fever, chills, sweats, unintended wt loss, pleuritic or exertional cp, hempoptysis, orthopnea pnd or change in chronic leg swelling. Also denies presyncope, palpitations, heartburn, abdominal pain, nausea, vomiting, diarrhea or change in bowel or urinary habits, dysuria,hematuria, rash, arthralgias, visual complaints, headache, numbness weakness or ataxia.     Objective:   Physical Exam   Gen.  Pleasant, obese, in no distress ENT - no lesions, no post nasal drip Neck: No JVD, no thyromegaly, no carotid bruits Lungs: no use of accessory muscles, no dullness to percussion, decreased without rales or rhonchi  Cardiovascular: Rhythm regular, heart sounds  normal, no murmurs or gallops, no peripheral edema Musculoskeletal: No deformities, no cyanosis or clubbing , no tremors        Assessment & Plan:

## 2018-04-26 ENCOUNTER — Ambulatory Visit: Payer: Medicare HMO | Admitting: Hematology & Oncology

## 2018-04-26 ENCOUNTER — Other Ambulatory Visit: Payer: Medicare HMO

## 2018-04-30 ENCOUNTER — Ambulatory Visit: Payer: Medicare HMO | Admitting: Pulmonary Disease

## 2018-05-07 ENCOUNTER — Other Ambulatory Visit: Payer: Medicare HMO

## 2018-05-07 ENCOUNTER — Ambulatory Visit: Payer: Medicare HMO | Admitting: Hematology & Oncology

## 2018-05-14 ENCOUNTER — Other Ambulatory Visit: Payer: Self-pay | Admitting: Cardiovascular Disease

## 2018-06-07 ENCOUNTER — Ambulatory Visit: Payer: Medicare HMO | Admitting: Cardiovascular Disease

## 2018-06-20 ENCOUNTER — Ambulatory Visit (INDEPENDENT_AMBULATORY_CARE_PROVIDER_SITE_OTHER): Payer: Medicare HMO | Admitting: Cardiovascular Disease

## 2018-06-20 ENCOUNTER — Encounter: Payer: Self-pay | Admitting: Cardiovascular Disease

## 2018-06-20 VITALS — BP 100/62 | HR 74 | Ht 62.0 in | Wt 204.4 lb

## 2018-06-20 DIAGNOSIS — Z6837 Body mass index (BMI) 37.0-37.9, adult: Secondary | ICD-10-CM

## 2018-06-20 DIAGNOSIS — I10 Essential (primary) hypertension: Secondary | ICD-10-CM

## 2018-06-20 DIAGNOSIS — Z86718 Personal history of other venous thrombosis and embolism: Secondary | ICD-10-CM | POA: Diagnosis not present

## 2018-06-20 DIAGNOSIS — Z7901 Long term (current) use of anticoagulants: Secondary | ICD-10-CM

## 2018-06-20 DIAGNOSIS — Z95810 Presence of automatic (implantable) cardiac defibrillator: Secondary | ICD-10-CM | POA: Diagnosis not present

## 2018-06-20 DIAGNOSIS — I4581 Long QT syndrome: Secondary | ICD-10-CM | POA: Diagnosis not present

## 2018-06-20 DIAGNOSIS — G4733 Obstructive sleep apnea (adult) (pediatric): Secondary | ICD-10-CM | POA: Diagnosis not present

## 2018-06-20 DIAGNOSIS — I428 Other cardiomyopathies: Secondary | ICD-10-CM

## 2018-06-20 DIAGNOSIS — I5042 Chronic combined systolic (congestive) and diastolic (congestive) heart failure: Secondary | ICD-10-CM

## 2018-06-20 DIAGNOSIS — E669 Obesity, unspecified: Secondary | ICD-10-CM

## 2018-06-20 DIAGNOSIS — N184 Chronic kidney disease, stage 4 (severe): Secondary | ICD-10-CM

## 2018-06-20 DIAGNOSIS — E1169 Type 2 diabetes mellitus with other specified complication: Secondary | ICD-10-CM

## 2018-06-20 NOTE — Patient Instructions (Signed)
Medication Instructions:   Continue same medications  If you need a refill on your cardiac medications before your next appointment, please call your pharmacy.   Lab work: None ordered   Testing/Procedures:  None ordered  Follow-Up: At [facility], you and your health needs are our priority.  As part of our continuing mission to provide you with exceptional heart care, we have created designated Provider Care Teams.  These Care Teams include your primary Cardiologist (physician) and Advanced Practice Providers (APPs -  Physician Assistants and Nurse Practitioners) who all work together to provide you with the care you need, when you need it.  . Follow Up with in 6 months  Call 3 months before to schedule  Sharman Cheek RN will call to schedule appointment in Heart Failure Program

## 2018-06-20 NOTE — Progress Notes (Signed)
Cardiology Office Note    Date:  06/20/2018   ID:  Patricia Morgan, DOB 09-Apr-1951, MRN [id number]  PCP:  Antony Contras, MD  Cardiologist:   Sanda Klein, MD   Chief Complaint  Patient presents with  . Pacemaker Check  . Congestive Heart Failure    History of Present Illness:  Patricia Morgan is a 68 y.o. 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  She had uncomplicated elective primary prevention implantation of a defibrillator about 3 months ago (Medtronic VISIA AF).  Device interrogation today shows normal functioning.  Lead parameters are excellent.  Estimated generator years.  She has not had any episodes of atrial fibrillation or ventricular tachycardia.  Frequent PACs are seen on the presenting electrogram and she is aware of these.  Her OptiVol appears stable but has only been for the last couple of months.  She has mild symmetrical ankle edema.  She does have NYHA functional class II exertional dyspnea.  We are struggling to maintain the balance between her renal function and heart problems.  Her nephrologist is Dr. Vanetta Mulders.  Her most recent echo shows severely decreased left ventricular systolic function with an ejection fraction of 25%.  A nuclear stress test was performed and shows a fixed inferior wall defect that I suspect is diaphragmatic attenuation artifact.  There was no reversible ischemia.  A similar EF of 22% was reported.  She is compliant with carvedilol in the maximum usual dose and also takes hydralazine/nitrates at the highest dose tolerated by her blood pressure.  She is not receiving RAAS inhibitors due to chronic kidney disease.  Coronary angiography performed June 2017 showed  no evidence of coronary artery disease but confirmed elevated filling pressure. PA pressure was 57/18 in the setting of a mean pulmonary wedge pressure of 33 mmHg. Echo showed ejection fraction of 45%. Nuclear scintigraphy calculated ejection fraction of 38%. It also suggested the presence of perfusion abnormalities, false positive by coronary angiography. She sees Dr. Moshe Cipro for chronic kidney disease and her baseline creatinine is around 2.0-2.5. Her primary care physician is Dr. Antony Contras. Dr. Elsworth Soho manages her sleep apnea treatment.  Her sister [name] had coronary artery disease and long QT syndrome and passed away in 01-Sep-2017.  Past Medical History:  Diagnosis Date  . ANEMIA-NOS   . BREAST CANCER, HX OF 1994 & 1995   1994>>recurrence in 09/01/1993, R breast, s/p lumpectomy>>mastectomy  . Cancer ([place])   . CHF (congestive heart failure) ([place])   . CHRONIC KIDNEY DISEASE STAGE III (MODERATE)   . Clotting disorder ([place])    on eliquis for Factor 5 disorder  . Colon polyps   . DIABETES MELLITUS, TYPE II   . Diverticulosis   . HYPERLIPIDEMIA   . HYPERTENSION   . OBSTRUCTIVE SLEEP APNEA    CPAP  . Personal history of chemotherapy   . Proteinuria   . PULMONARY EMBOLISM 09/24/2008   anticoag thru 03/2010  . Sleep apnea    Wears CPAP nightly    Past Surgical History:  Procedure Laterality Date  . BREAST SURGERY    . CARDIAC CATHETERIZATION N/A 11/02/2015   Procedure: Right/Left Heart Cath and Coronary Angiography;  Surgeon: Burnell Blanks, MD;  Location: [facility];  Service: Cardiovascular;  Laterality: N/A;  . ICD IMPLANT N/A 03/14/2018  Procedure: ICD IMPLANT;  Surgeon: Sanda Klein, MD;  Location: [facility];  Service: Cardiovascular;  Laterality: N/A;  . insertion port a cath    . MASTECTOMY Right 1995   Right  . PORT-A-CATH REMOVAL      Current Medications: Outpatient Medications Prior to Visit  Medication Sig Dispense Refill  . acetaminophen  (TYLENOL) 500 MG tablet Take 500 mg by mouth every 6 (six) hours as needed for mild pain.    [name] allopurinol (ZYLOPRIM) 100 MG tablet Take 100 mg by mouth 2 (two) times daily.     [name] apixaban (ELIQUIS) 5 MG TABS tablet Take 1 tablet (5 mg total) by mouth 2 (two) times daily. 180 tablet 6  . carvedilol (COREG) 25 MG tablet TAKE 1 TABLET 2 (TWO) TIMES DAILY WITH MEALS 180 tablet 3  . Cholecalciferol (VITAMIN D-3) 5000 units TABS Take 5,000 Units by mouth every Sunday.    . colchicine 0.6 MG tablet Take 0.6 mg by mouth daily.   1  . furosemide (LASIX) 40 MG tablet Take 2 tablets (80 mg total) by mouth 2 (two) times daily. Take 80 mg in the morning and 40 mg in the evening (Patient taking differently: Take 40 mg by mouth See admin instructions. Take 1 tablet (40 mg) by mouth scheduled in the morning & take 1 tablet (40 mg) by mouth scheduled in the afternoon, may take 1 tablet (40 mg) by mouth in the evening as needed for weight gain or breathing difficulties.) 270 tablet 1  . hydrALAZINE (APRESOLINE) 10 MG tablet TAKE 1 TABLET (10 MG) BY MOUTH THREE TIMES DAILY (Patient taking differently: Take 10 mg by mouth 3 (three) times daily. (1000, 1600, & 2200)) 270 tablet 3  . Insulin Glargine (LANTUS SOLOSTAR) 100 UNIT/ML Solostar Pen Inject 15 Units into the skin daily as needed (for blood sugar greater than 200.). Per sliding scale.     . insulin lispro (HUMALOG KWIKPEN) 100 UNIT/ML KiwkPen Inject 10 Units into the skin 3 (three) times daily as needed (for blood sugar greater than 200.).     [name] isosorbide mononitrate (IMDUR) 30 MG 24 hr tablet TAKE 1 TABLET (30 MG) BY MOUTH DAILY (Patient taking differently: Take 30 mg by mouth daily. (1000)) 90 tablet 3  . Probiotic Product (PROBIOTIC PO) Take 1 capsule by mouth daily.    . rosuvastatin (CRESTOR) 10 MG tablet TAKE 1 TABLET EVERY DAY (Patient taking differently: Take 10 mg by mouth daily at 10 pm. (2200)) 90 tablet 3  . TRUE METRIX BLOOD GLUCOSE TEST test strip       . vitamin B-12 (CYANOCOBALAMIN) 1000 MCG tablet Take 1,000 mcg by mouth daily.     No facility-administered medications prior to visit.      Allergies:   Patient has no known allergies.   Social History   Socioeconomic History  . Marital status: Divorced    Spouse name: Not on file  . Number of children: 2  . Years of education: 75  . Highest education level: Not on file  Occupational History  . Occupation: retired  Scientific laboratory technician  . Financial resource strain: Not on file  . Food insecurity:    Worry: Not on file    Inability: Not on file  . Transportation needs:    Medical: Not on file    Non-medical: Not on file  Tobacco Use  . Smoking status: Never Smoker  . Smokeless tobacco: Never Used  . Tobacco comment: Lives alone-divorced  Substance  and Sexual Activity  . Alcohol use: No    Alcohol/week: 0.0 standard drinks  . Drug use: No  . Sexual activity: Not on file  Lifestyle  . Physical activity:    Days per week: Not on file    Minutes per session: Not on file  . Stress: Not on file  Relationships  . Social connections:    Talks on phone: Not on file    Gets together: Not on file    Attends religious service: Not on file    Active member of club or organization: Not on file    Attends meetings of clubs or organizations: Not on file    Relationship status: Not on file  Other Topics Concern  . Not on file  Social History Narrative  . Not on file     Family History:  The patient's family history includes Asthma in her father; Diabetes in her sister; Parkinson's disease in her mother. Her sister had long QT syndrome, but no documented ventricular arrhythmia  ROS:   Please see the history of present illness.    ROS all other systems reviewed and are negative  PHYSICAL EXAM:   VS:  BP 100/62   Pulse 74   Ht 5\' 2"  (1.575 m)   Wt 204 lb 6.4 oz (92.7 kg)   BMI 37.39 kg/m      General: Alert, oriented x3, no distress, the defibrillator site is well-healed  albeit with a little bit of keloid scar formation, obese Head: no evidence of trauma, PERRL, EOMI, no exophtalmos or lid lag, no myxedema, no xanthelasma; normal ears, nose and oropharynx Neck: normal jugular venous pulsations and no hepatojugular reflux; brisk carotid pulses without delay and no carotid bruits Chest: clear to auscultation, no signs of consolidation by percussion or palpation, normal fremitus, symmetrical and full respiratory excursions Cardiovascular: normal position and quality of the apical impulse, regular rhythm, normal first and second heart sounds, no murmurs, rubs or gallops Abdomen: no tenderness or distention, no masses by palpation, no abnormal pulsatility or arterial bruits, normal bowel sounds, no hepatosplenomegaly Extremities: no clubbing, cyanosis; there is symmetrical 1+ ankle edema; 2+ radial, ulnar and brachial pulses bilaterally; 2+ right femoral, posterior tibial and dorsalis pedis pulses; 2+ left femoral, posterior tibial and dorsalis pedis pulses; no subclavian or femoral bruits Neurological: grossly nonfocal Psych: Normal mood and affect   Wt Readings from Last 3 Encounters:  06/20/18 204 lb 6.4 oz (92.7 kg)  04/23/18 197 lb (89.4 kg)  03/30/18 196 lb 3.2 oz (89 kg)      Studies/Labs Reviewed:   EKG:  EKG is not ordered today.  Electrogram shows regular rhythm with occasional PVCs (identical EGM morphology)  Recent Labs: 12/10/2017: B Natriuretic Peptide 1,143.1 01/05/2018: ALT 12 03/08/2018: Hemoglobin 9.9; NT-Pro BNP 6,869; Platelets 169 03/14/2018: BUN 41; Creatinine, Ser 2.49; Potassium 4.1; Sodium 140   Lipid Panel    Component Value Date/Time   CHOL 184 09/13/2012 1357   TRIG 142 09/13/2012 1357   HDL 45 09/13/2012 1357   CHOLHDL 4.1 09/13/2012 1357   VLDL 28 09/13/2012 1357   LDLCALC 111 (H) 09/13/2012 1357   LDLDIRECT 141.3 10/14/2008 1004      ASSESSMENT:    1. Chronic combined systolic and diastolic CHF (congestive heart  failure) ([place])   2. Nonischemic cardiomyopathy ([place])   3. Long Q-T syndrome   4. ICD (implantable cardioverter-defibrillator) in place   5. History of venous thromboembolism   6. Long term (current) use  of anticoagulants   7. Essential hypertension   8. OSA (obstructive sleep apnea)   9. CKD (chronic kidney disease) stage 4, GFR 15-29 ml/min (H[facility])   10. Class 2 severe obesity due to excess calories with serious comorbidity and body mass index (BMI) of 37.0 to 37.9 in adult ([place])   11. Diabetes mellitus type 2 in obese [facility])      PLAN:  In order of problems listed above:  1. CHF: Remains mildly edematous functional class III dyspnea, and edema, excessive diuresis due to her renal dysfunction.  She is on maximum dose carvedilol.  Unable to prescribe RAAS inhibitors due to renal dysfunction, on hydralazine/nitrates vasodilator therapy and maximum tolerated dose (blood pressure is only 100/60). 2. CMP: Normal coronary arteries a couple of years ago, by angiography.  Possibly received Adriamycin for chemotherapy for breast cancer in 1994.  Might also have some familial cardiomyopathy. 3. Long QT: Her sister had long QT syndrome.  Teresa's QT interval has always been mildly prolonged generally around 460-470 ms, on one occasion up to 490.   4. ICD: Normal device function.  Comprehensive downloads every 3 months, OptiVol downloads monthly (enroll in heart failure device clinic with Sharman Cheek), office visit in 6 months. 5. Recurrent DVT/PE:  hypercoagulable state due to heterozygous factor V Leiden. Should remain on lifelong anticoagulation.   6. Eliquis: No bleeding complications. 7. HTN: Blood pressure is low. 8. OSA: Reports 100% compliance with CPAP.  Also reports benefit from this treatment.  She has a follow-up appointment with Dr. Elsworth Soho on November 15. 9. CKD: most recent creatinine 2.49.  Sees Dr. Moshe Cipro.  Baseline creatinine around 2.0. GFR 20-25 10. Obesity: Still trying to lose  weight, but she has lost around the last few months.  Her "dry weight" is a moving target. 11. DM: Reports good control.   Medication Adjustments/Labs and Tests Ordered: Current medicines are reviewed at length with the patient today.  Concerns regarding medicines are outlined above.  Medication changes, Labs and Tests ordered today are listed in the Patient Instructions below. Patient Instructions  Medication Instructions:   Continue same medications  If you need a refill on your cardiac medications before your next appointment, please call your pharmacy.   Lab work: None ordered   Testing/Procedures:  None ordered  Follow-Up: At [facility], you and your health needs are our priority.  As part of our continuing mission to provide you with exceptional heart care, we have created designated Provider Care Teams.  These Care Teams include your primary Cardiologist (physician) and Advanced Practice Providers (APPs -  Physician Assistants and Nurse Practitioners) who all work together to provide you with the care you need, when you need it.  . Follow Up with in 6 months  Call 3 months before to schedule  Sharman Cheek RN will call to schedule appointment in Heart Failure Program      Signed, Sanda Klein, MD  06/20/2018 4:32 PM    [facility] [address], [place], [place]  [address] Phone: [phone]; Fax: [phone]

## 2018-06-22 ENCOUNTER — Telehealth: Payer: Self-pay

## 2018-06-22 NOTE — Telephone Encounter (Signed)
Referred to [facility] clinic by Dr Sallyanne Kuster.  Spoke with patient and agreeable to monthly follow up.  Advised monitor should be by bedside in order for it to automatically transmit a report during sleep hours of 12 midnight and 6 AM.  Patient confirmed monitor is at bedside. Advised will receive a call after the transmission is reviewed to provide results.  Explained a Remote Home Transmission will be seen as daytime appointment on office summary visit sheet but the time is not relevant since all transmissions are sent at night time so there is no obligation to stay by the monitor at that time appointed time during the day.  Have not received remote transmission since new device was implanted 02/2018 scheduled remote transmission for 07/03/2018.  If it is not received automatically then will call to assist her to send a manual transmission. She stated that will be fine.

## 2018-06-26 LAB — CUP PACEART INCLINIC DEVICE CHECK
Implantable Lead Implant Date: 20191030
Implantable Lead Location: 753860
Implantable Pulse Generator Implant Date: 20191030
MDC IDC SESS DTM: 20200211111159

## 2018-07-03 ENCOUNTER — Ambulatory Visit (INDEPENDENT_AMBULATORY_CARE_PROVIDER_SITE_OTHER): Payer: Medicare HMO

## 2018-07-03 DIAGNOSIS — I5042 Chronic combined systolic (congestive) and diastolic (congestive) heart failure: Secondary | ICD-10-CM | POA: Diagnosis not present

## 2018-07-03 DIAGNOSIS — Z95810 Presence of automatic (implantable) cardiac defibrillator: Secondary | ICD-10-CM

## 2018-07-04 ENCOUNTER — Ambulatory Visit (INDEPENDENT_AMBULATORY_CARE_PROVIDER_SITE_OTHER): Payer: Medicare HMO

## 2018-07-04 DIAGNOSIS — Z9189 Other specified personal risk factors, not elsewhere classified: Secondary | ICD-10-CM

## 2018-07-04 DIAGNOSIS — I428 Other cardiomyopathies: Secondary | ICD-10-CM | POA: Diagnosis not present

## 2018-07-04 NOTE — Progress Notes (Signed)
EPIC Encounter for ICM Monitoring  Patient Name: Patricia Morgan is a 68 y.o. female Date: 07/04/2018 Primary Care Physican: Antony Contras, MD Primary Cardiologist: Croitoru Electrophysiologist: Croitoru Last Weight: 204 lbs (last office visit 06/20/2018) Today's Weight: 196 lbs (weighs daily at home)       Heart Failure questions reviewed.  Pt asymptomatic and feeling fine at this time.  She tries to keep baseline weight under 200 lbs.   Report: Thoracic impedance normal.  She said Dr Sallyanne Kuster said she had some fluid when she had office visit 06/19/2018.  Prescribed: Furosemide 40 mg Take 1 tablet (40 mg) by mouth scheduled in the morning & take 1 tablet (40 mg) by mouth scheduled in the afternoon, may take 1 tablet (40 mg) by mouth in the evening as needed for weight gain or breathing difficulties.  Recommendations: No changes.  Reinforced limiting salt intake to < 2000 mg daily and fluid intake to 64 oz daily.  Encouraged to call for fluid symptoms.  Follow-up plan: ICM clinic phone appointment on 08/06/2018.    Copy of ICM check sent to Dr. Sallyanne Kuster.   3 month ICM trend: 07/04/2018    1 Year ICM trend:       Rosalene Billings, RN 07/04/2018 10:54 AM

## 2018-07-05 ENCOUNTER — Inpatient Hospital Stay: Payer: Medicare HMO | Attending: Hematology & Oncology | Admitting: Family

## 2018-07-05 ENCOUNTER — Inpatient Hospital Stay: Payer: Medicare HMO

## 2018-07-05 VITALS — BP 104/62 | HR 72 | Temp 97.6°F | Ht 62.0 in | Wt 201.8 lb

## 2018-07-05 DIAGNOSIS — Z7901 Long term (current) use of anticoagulants: Secondary | ICD-10-CM | POA: Insufficient documentation

## 2018-07-05 DIAGNOSIS — Z9011 Acquired absence of right breast and nipple: Secondary | ICD-10-CM | POA: Insufficient documentation

## 2018-07-05 DIAGNOSIS — I82401 Acute embolism and thrombosis of unspecified deep veins of right lower extremity: Secondary | ICD-10-CM

## 2018-07-05 DIAGNOSIS — D6851 Activated protein C resistance: Secondary | ICD-10-CM | POA: Insufficient documentation

## 2018-07-05 DIAGNOSIS — I509 Heart failure, unspecified: Secondary | ICD-10-CM | POA: Diagnosis not present

## 2018-07-05 DIAGNOSIS — Z86718 Personal history of other venous thrombosis and embolism: Secondary | ICD-10-CM | POA: Insufficient documentation

## 2018-07-05 DIAGNOSIS — Z853 Personal history of malignant neoplasm of breast: Secondary | ICD-10-CM | POA: Diagnosis not present

## 2018-07-05 DIAGNOSIS — Z79899 Other long term (current) drug therapy: Secondary | ICD-10-CM | POA: Insufficient documentation

## 2018-07-05 DIAGNOSIS — Z95810 Presence of automatic (implantable) cardiac defibrillator: Secondary | ICD-10-CM | POA: Insufficient documentation

## 2018-07-05 LAB — CMP (CANCER CENTER ONLY)
ALT: 13 U/L (ref 0–44)
AST: 21 U/L (ref 15–41)
Albumin: 4.1 g/dL (ref 3.5–5.0)
Alkaline Phosphatase: 117 U/L (ref 38–126)
Anion gap: 8 (ref 5–15)
BILIRUBIN TOTAL: 0.7 mg/dL (ref 0.3–1.2)
BUN: 54 mg/dL — AB (ref 8–23)
CHLORIDE: 105 mmol/L (ref 98–111)
CO2: 27 mmol/L (ref 22–32)
Calcium: 9.8 mg/dL (ref 8.9–10.3)
Creatinine: 2.83 mg/dL — ABNORMAL HIGH (ref 0.44–1.00)
GFR, EST AFRICAN AMERICAN: 19 mL/min — AB (ref >60)
GFR, Estimated: 16 mL/min — ABNORMAL LOW (ref >60)
Glucose, Bld: 141 mg/dL — ABNORMAL HIGH (ref 70–99)
POTASSIUM: 4.3 mmol/L (ref 3.5–5.1)
Sodium: 140 mmol/L (ref 135–145)
TOTAL PROTEIN: 6.8 g/dL (ref 6.5–8.1)

## 2018-07-05 LAB — CBC WITH DIFFERENTIAL (CANCER CENTER ONLY)
Abs Immature Granulocytes: 0.02 10*3/uL (ref 0.00–0.07)
BASOS PCT: 1 %
Basophils Absolute: 0 10*3/uL (ref 0.0–0.1)
EOS ABS: 0.1 10*3/uL (ref 0.0–0.5)
EOS PCT: 2 %
HEMATOCRIT: 32.1 % — AB (ref 36.0–46.0)
Hemoglobin: 10.1 g/dL — ABNORMAL LOW (ref 12.0–15.0)
Immature Granulocytes: 1 %
LYMPHS ABS: 1.1 10*3/uL (ref 0.7–4.0)
Lymphocytes Relative: 29 %
MCH: 31.2 pg (ref 26.0–34.0)
MCHC: 31.5 g/dL (ref 30.0–36.0)
MCV: 99.1 fL (ref 80.0–100.0)
MONO ABS: 0.3 10*3/uL (ref 0.1–1.0)
MONOS PCT: 9 %
Neutro Abs: 2.1 10*3/uL (ref 1.7–7.7)
Neutrophils Relative %: 58 %
PLATELETS: 146 10*3/uL — AB (ref 150–400)
RBC: 3.24 MIL/uL — ABNORMAL LOW (ref 3.87–5.11)
RDW: 15.8 % — AB (ref 11.5–15.5)
WBC Count: 3.6 10*3/uL — ABNORMAL LOW (ref 4.0–10.5)
nRBC: 0 % (ref 0.0–0.2)

## 2018-07-05 LAB — LACTATE DEHYDROGENASE: LDH: 213 U/L — ABNORMAL HIGH (ref 98–192)

## 2018-07-05 NOTE — Progress Notes (Signed)
Hematology and Oncology Follow Up Visit  Patricia Morgan [id number] 1950/11/22 68 y.o. 07/05/2018   Principle Diagnosis:  Recurrent Thromboembolic disease Factor V Leiden - Heterozygote H/O locally recurrent ductal carcinoma - RIGHT breast  Current Therapy:   Eliquis 5 mg po BID   Interim History:  Patricia Morgan is here today for follow-up. She is doing well and has no complaints at this time. She had an ICD placed on October 30th last year. So far she has done well. She states that it fired once due to her church usher badge being placed over top of it so she no longer wears the badge. She states that she had a follow-up with her cardiologist Dr. Sallyanne Kuster and got a good report.  She has had no episodes of bleeding, no bruising or petechiae.  No fever, chills, n/v, cough, rash, dizziness, chest pain, palpitations, abdominal pain or changes in bowel or bladder habits.  No tenderness, numbness or tingling in her extremities.  She takes lasix 80 mg PO BID to help reduce any fluid retention.  She has history of CHF and has occasional episodes of SOB with over exertion. She will take breaks to rest if needed.  No lymphadenopathy noted on exam.  She has maintained a good appetite and is staying well hydrated. Her weight is stable.   ECOG Performance Status: 1 - Symptomatic but completely ambulatory  Medications:  Allergies as of 07/05/2018   No Known Allergies     Medication List       Accurate as of July 05, 2018 10:49 AM. Always use your most recent med list.        acetaminophen 500 MG tablet Commonly known as:  TYLENOL Take 500 mg by mouth every 6 (six) hours as needed for mild pain.   allopurinol 100 MG tablet Commonly known as:  ZYLOPRIM Take 100 mg by mouth 2 (two) times daily.   apixaban 5 MG Tabs tablet Commonly known as:  ELIQUIS Take 1 tablet (5 mg total) by mouth 2 (two) times daily.   carvedilol 25 MG tablet Commonly known as:  COREG TAKE 1 TABLET 2 (TWO)  TIMES DAILY WITH MEALS   colchicine 0.6 MG tablet Take 0.6 mg by mouth daily.   CORTIZONE-10 DIABETICS SKIN 1 % lotion Generic drug:  hydrocortisone   furosemide 40 MG tablet Commonly known as:  LASIX Take 2 tablets (80 mg total) by mouth 2 (two) times daily. Take 80 mg in the morning and 40 mg in the evening   HUMALOG KWIKPEN 100 UNIT/ML KiwkPen Generic drug:  insulin lispro Inject 10 Units into the skin 3 (three) times daily as needed (for blood sugar greater than 200.).   hydrALAZINE 10 MG tablet Commonly known as:  APRESOLINE TAKE 1 TABLET (10 MG) BY MOUTH THREE TIMES DAILY   isosorbide mononitrate 30 MG 24 hr tablet Commonly known as:  IMDUR TAKE 1 TABLET (30 MG) BY MOUTH DAILY   LANTUS SOLOSTAR 100 UNIT/ML Solostar Pen Generic drug:  Insulin Glargine Inject 15 Units into the skin daily as needed (for blood sugar greater than 200.). Per sliding scale.   PROBIOTIC PO Take 1 capsule by mouth daily.   rosuvastatin 10 MG tablet Commonly known as:  CRESTOR TAKE 1 TABLET EVERY DAY   TRUE METRIX BLOOD GLUCOSE TEST test strip Generic drug:  glucose blood   UNDERGARMENT EXTRA ABSORBENT Misc   vitamin B-12 1000 MCG tablet Commonly known as:  CYANOCOBALAMIN Take 1,000 mcg by mouth daily.  Vitamin D-3 125 MCG (5000 UT) Tabs Take 5,000 Units by mouth every Sunday.       Allergies: No Known Allergies  Past Medical History, Surgical history, Social history, and Family History were reviewed and updated.  Review of Systems: All other 10 point review of systems is negative.   Physical Exam:  height is 5\' 2"  (1.575 m) and weight is 201 lb 12 oz (91.5 kg). Her oral temperature is 97.6 F (36.4 C). Her blood pressure is 104/62 and her pulse is 72. Her oxygen saturation is 98%.   Wt Readings from Last 3 Encounters:  07/05/18 201 lb 12 oz (91.5 kg)  06/20/18 204 lb 6.4 oz (92.7 kg)  04/23/18 197 lb (89.4 kg)    Ocular: Sclerae unicteric, pupils equal, round and  reactive to light Ear-nose-throat: Oropharynx clear, dentition fair Lymphatic: No cervical, supraclavicular or axillary adenopathy Lungs no rales or rhonchi, good excursion bilaterally Heart regular rate and rhythm, no murmur appreciated Abd soft, nontender, positive bowel sounds, no liver or spleen tip palpated on exam, no fluid wave  MSK no focal spinal tenderness, no joint edema Neuro: non-focal, well-oriented, appropriate affect Breasts: Deferred   Lab Results  Component Value Date   WBC 3.6 (L) 07/05/2018   HGB 10.1 (L) 07/05/2018   HCT 32.1 (L) 07/05/2018   MCV 99.1 07/05/2018   PLT 146 (L) 07/05/2018   Lab Results  Component Value Date   FERRITIN 163 07/16/2012   IRON 83 07/16/2012   TIBC 379 07/16/2012   UIBC 296 07/16/2012   IRONPCTSAT 22 07/16/2012   Lab Results  Component Value Date   RETICCTPCT 2.1 07/16/2012   RBC 3.24 (L) 07/05/2018   RETICCTABS 83.6 07/16/2012   No results found for: KPAFRELGTCHN, LAMBDASER, KAPLAMBRATIO No results found for: IGGSERUM, IGA, IGMSERUM No results found for: Kathrynn Ducking, MSPIKE, SPEI   Chemistry      Component Value Date/Time   NA 140 03/14/2018 1346   NA 142 03/08/2018 1640   NA 141 12/07/2016 1300   NA 143 03/18/2016 0944   K 4.1 03/14/2018 1346   K 4.7 12/07/2016 1300   K 4.9 03/18/2016 0944   CL 105 03/14/2018 1346   CL 103 12/07/2016 1300   CO2 26 03/14/2018 1346   CO2 29 12/07/2016 1300   CO2 27 03/18/2016 0944   BUN 41 (H) 03/14/2018 1346   BUN 42 (H) 03/08/2018 1640   BUN 45 (H) 12/07/2016 1300   BUN 29.0 (H) 03/18/2016 0944   CREATININE 2.49 (H) 03/14/2018 1346   CREATININE 2.07 (H) 01/05/2018 0940   CREATININE 2.6 (H) 12/07/2016 1300   CREATININE 2.1 (H) 03/18/2016 0944      Component Value Date/Time   CALCIUM 10.1 03/14/2018 1346   CALCIUM 9.9 12/07/2016 1300   CALCIUM 9.3 03/18/2016 0944   ALKPHOS 96 01/05/2018 0940   ALKPHOS 127 (H) 12/07/2016 1300     ALKPHOS 147 03/18/2016 0944   AST 22 01/05/2018 0940   AST 22 03/18/2016 0944   ALT 12 01/05/2018 0940   ALT 29 12/07/2016 1300   ALT 19 03/18/2016 0944   BILITOT 0.6 01/05/2018 0940   BILITOT 0.32 03/18/2016 0944       Impression and Plan: Patricia Morgan is a very pleasant 68 yo African American female with history of recurrent thromboembolic disease on lifelong anticoagulation with Eliquis. She has done well with this and has no complaints.  She does have history of recurrent right  breast cancer with mastectomy in 1994.  She is doing well and has no complaints at this time.  We will plan to see her back in another 6 months for follow-up.  She will contact our office with any questions or concerns. We can certainly see her sooner if need be.   Laverna Peace, NP 2/20/202010:49 AM

## 2018-07-06 LAB — CUP PACEART REMOTE DEVICE CHECK
Battery Remaining Longevity: 136 mo
Brady Statistic RV Percent Paced: 0 %
HighPow Impedance: 58 Ohm
Implantable Lead Location: 753860
Implantable Pulse Generator Implant Date: 20191030
Lead Channel Impedance Value: 342 Ohm
Lead Channel Impedance Value: 418 Ohm
Lead Channel Pacing Threshold Amplitude: 0.625 V
Lead Channel Setting Pacing Pulse Width: 0.4 ms
Lead Channel Setting Sensing Sensitivity: 0.3 mV
MDC IDC LEAD IMPLANT DT: 20191030
MDC IDC MSMT BATTERY VOLTAGE: 3.16 V
MDC IDC MSMT LEADCHNL RV PACING THRESHOLD PULSEWIDTH: 0.4 ms
MDC IDC MSMT LEADCHNL RV SENSING INTR AMPL: 22 mV
MDC IDC MSMT LEADCHNL RV SENSING INTR AMPL: 22 mV
MDC IDC SESS DTM: 20200218051703
MDC IDC SET LEADCHNL RV PACING AMPLITUDE: 2 V

## 2018-07-12 NOTE — Progress Notes (Signed)
Remote ICD transmission.   

## 2018-07-13 ENCOUNTER — Encounter: Payer: Self-pay | Admitting: Cardiology

## 2018-07-26 ENCOUNTER — Other Ambulatory Visit: Payer: Self-pay | Admitting: Cardiovascular Disease

## 2018-08-06 ENCOUNTER — Ambulatory Visit (INDEPENDENT_AMBULATORY_CARE_PROVIDER_SITE_OTHER): Payer: Medicare HMO

## 2018-08-06 ENCOUNTER — Other Ambulatory Visit: Payer: Self-pay

## 2018-08-06 DIAGNOSIS — Z95810 Presence of automatic (implantable) cardiac defibrillator: Secondary | ICD-10-CM

## 2018-08-06 DIAGNOSIS — I5042 Chronic combined systolic (congestive) and diastolic (congestive) heart failure: Secondary | ICD-10-CM

## 2018-08-07 DIAGNOSIS — N183 Chronic kidney disease, stage 3 (moderate): Secondary | ICD-10-CM | POA: Diagnosis not present

## 2018-08-07 DIAGNOSIS — M109 Gout, unspecified: Secondary | ICD-10-CM | POA: Diagnosis not present

## 2018-08-07 DIAGNOSIS — M85851 Other specified disorders of bone density and structure, right thigh: Secondary | ICD-10-CM | POA: Diagnosis not present

## 2018-08-07 DIAGNOSIS — I10 Essential (primary) hypertension: Secondary | ICD-10-CM | POA: Diagnosis not present

## 2018-08-07 DIAGNOSIS — I129 Hypertensive chronic kidney disease with stage 1 through stage 4 chronic kidney disease, or unspecified chronic kidney disease: Secondary | ICD-10-CM | POA: Diagnosis not present

## 2018-08-07 DIAGNOSIS — D6851 Activated protein C resistance: Secondary | ICD-10-CM | POA: Diagnosis not present

## 2018-08-07 DIAGNOSIS — N184 Chronic kidney disease, stage 4 (severe): Secondary | ICD-10-CM | POA: Diagnosis not present

## 2018-08-07 DIAGNOSIS — M7552 Bursitis of left shoulder: Secondary | ICD-10-CM | POA: Diagnosis not present

## 2018-08-07 DIAGNOSIS — I5020 Unspecified systolic (congestive) heart failure: Secondary | ICD-10-CM | POA: Diagnosis not present

## 2018-08-07 DIAGNOSIS — Z853 Personal history of malignant neoplasm of breast: Secondary | ICD-10-CM | POA: Diagnosis not present

## 2018-08-07 DIAGNOSIS — I428 Other cardiomyopathies: Secondary | ICD-10-CM | POA: Diagnosis not present

## 2018-08-07 DIAGNOSIS — C50911 Malignant neoplasm of unspecified site of right female breast: Secondary | ICD-10-CM | POA: Diagnosis not present

## 2018-08-07 DIAGNOSIS — E1122 Type 2 diabetes mellitus with diabetic chronic kidney disease: Secondary | ICD-10-CM | POA: Diagnosis not present

## 2018-08-07 DIAGNOSIS — E7800 Pure hypercholesterolemia, unspecified: Secondary | ICD-10-CM | POA: Diagnosis not present

## 2018-08-07 NOTE — Progress Notes (Signed)
EPIC Encounter for ICM Monitoring  Patient Name: Patricia Morgan is a 68 y.o. female Date: 08/07/2018 Primary Care Physican: Antony Contras, MD Primary Cardiologist: Croitoru Electrophysiologist: Croitoru Last Weight: 204 lbs (last office visit 06/20/2018) 07/03/2018 Weight: 196 lbs (weighs daily at home) 08/07/2018 Weight: unknown                                                           Attempted call to patient and unable to reach.  Left message to return call. Transmission reviewed.    Report: Thoracic impedance normal.  Prescribed: Furosemide 40 mg Take 1 tablet (40 mg) by mouth scheduled in the morning & take 1 tablet (40 mg) by mouth scheduled in the afternoon, may take 1 tablet (40 mg) by mouth in the evening as needed for weight gain or breathing difficulties.  Recommendations: Unable to reach.  Follow-up plan: ICM clinic phone appointment on 09/10/2018  3 month ICM trend: 08/06/2018    1 Year ICM trend:       Rosalene Billings, RN 08/07/2018 1:49 PM

## 2018-08-08 ENCOUNTER — Telehealth: Payer: Self-pay

## 2018-08-08 NOTE — Telephone Encounter (Signed)
Remote ICM transmission received.  Attempted call to patient regarding ICM remote transmission and left message to return call   

## 2018-08-09 ENCOUNTER — Telehealth: Payer: Self-pay | Admitting: Cardiovascular Disease

## 2018-08-09 NOTE — Progress Notes (Signed)
Ty! MCr 

## 2018-08-09 NOTE — Telephone Encounter (Signed)
New Message:      Pt had a Defibrillator put in [place]. Her left arm is real sore and she wants to know if this is normal.

## 2018-08-09 NOTE — Telephone Encounter (Signed)
That would a very unusual consequence of ICD. It suggests a rotator cuff tear (but of course I am not an orthopedic specialist!).  I agree with getting Dr. Stann Mainland advice.  If Dr. Stann Mainland deems an MRI necessary, she does have an MRI-conditional device (with appropriate precautions and device reprogramming, an MRI of the shoulder can be safely performed. Hopefully can be improved with physical therapy.  If he recommends surgery, this can also be done with low risk, but the device would require reprogramming if electrocautery is to be used during surgery. MCr

## 2018-08-09 NOTE — Telephone Encounter (Addendum)
Advised patient, verbalized understanding  

## 2018-08-09 NOTE — Telephone Encounter (Signed)
Spoke with patient and she stated that she has not been able to raise her arm above her shoulder since she had defibrillator place in October. She has pain in her shoulder. Denies any swelling. She forgot to mention to Dr Sallyanne Kuster at follow up visit. She did have routine virtual visit with PCP Monday who suggested she see he orthopedist Dr Stann Mainland. She has appointment tomorrow. Advised patient to keep appointment with Dr Stann Mainland and would forward to Dr Sallyanne Kuster for review. If he has any further recommendations will call back.

## 2018-08-10 ENCOUNTER — Other Ambulatory Visit: Payer: Self-pay | Admitting: Cardiovascular Disease

## 2018-08-10 DIAGNOSIS — M7502 Adhesive capsulitis of left shoulder: Secondary | ICD-10-CM | POA: Diagnosis not present

## 2018-08-10 DIAGNOSIS — M25512 Pain in left shoulder: Secondary | ICD-10-CM | POA: Diagnosis not present

## 2018-08-10 NOTE — Progress Notes (Signed)
Returned patient call as requested by voice mail message. She reports feeling great.  She is staying at home due to [place] 19.  Advised to call back for any fluid symptoms and she has the number.

## 2018-08-13 DIAGNOSIS — G4733 Obstructive sleep apnea (adult) (pediatric): Secondary | ICD-10-CM | POA: Diagnosis not present

## 2018-08-13 NOTE — Telephone Encounter (Signed)
RX refill

## 2018-08-14 DIAGNOSIS — M7502 Adhesive capsulitis of left shoulder: Secondary | ICD-10-CM | POA: Diagnosis not present

## 2018-08-15 DIAGNOSIS — H60333 Swimmer's ear, bilateral: Secondary | ICD-10-CM | POA: Diagnosis not present

## 2018-08-16 DIAGNOSIS — E1122 Type 2 diabetes mellitus with diabetic chronic kidney disease: Secondary | ICD-10-CM | POA: Diagnosis not present

## 2018-08-16 DIAGNOSIS — Z6838 Body mass index (BMI) 38.0-38.9, adult: Secondary | ICD-10-CM | POA: Diagnosis not present

## 2018-08-16 DIAGNOSIS — I129 Hypertensive chronic kidney disease with stage 1 through stage 4 chronic kidney disease, or unspecified chronic kidney disease: Secondary | ICD-10-CM | POA: Diagnosis not present

## 2018-08-16 DIAGNOSIS — N183 Chronic kidney disease, stage 3 (moderate): Secondary | ICD-10-CM | POA: Diagnosis not present

## 2018-08-16 DIAGNOSIS — M109 Gout, unspecified: Secondary | ICD-10-CM | POA: Diagnosis not present

## 2018-08-18 ENCOUNTER — Other Ambulatory Visit: Payer: Self-pay | Admitting: Cardiovascular Disease

## 2018-08-20 NOTE — Telephone Encounter (Signed)
Carvedilol refilled.

## 2018-09-10 ENCOUNTER — Other Ambulatory Visit: Payer: Self-pay | Admitting: Cardiovascular Disease

## 2018-09-10 ENCOUNTER — Other Ambulatory Visit: Payer: Self-pay

## 2018-09-10 ENCOUNTER — Ambulatory Visit (INDEPENDENT_AMBULATORY_CARE_PROVIDER_SITE_OTHER): Payer: Medicare HMO

## 2018-09-10 DIAGNOSIS — Z95810 Presence of automatic (implantable) cardiac defibrillator: Secondary | ICD-10-CM

## 2018-09-10 DIAGNOSIS — I5042 Chronic combined systolic (congestive) and diastolic (congestive) heart failure: Secondary | ICD-10-CM

## 2018-09-11 NOTE — Telephone Encounter (Signed)
Hydralazine 10 mg refilled.

## 2018-09-11 NOTE — Progress Notes (Signed)
EPIC Encounter for ICM Monitoring  Patient Name: Patricia Morgan is a 68 y.o. female Date: 09/11/2018 Primary Care Physican: Antony Contras, MD Primary Cardiologist:Croitoru Electrophysiologist:Croitoru 09/11/2018 Weight:195lbs   Heart failure questions reviewed.   She reports the nephrologist decreased Furosemide due to Creatinine level had increased in April  Optivol Thoracic impedance normal.  Prescribed: by nephrologist 08/2018 to take Furosemide40 mg1 tablet (40 mg) every other morning alternating with 40 mg twice a day.  Recommendations: No changes and encouraged to call for any fluid symptoms.   Follow-up plan: ICM clinic phone appointment on 10/15/2018.    Copy of ICM check sent to Dr. Sallyanne Kuster.   3 month ICM trend: 09/11/2018    1 Year ICM trend:       Rosalene Billings, RN 09/11/2018 12:00 PM

## 2018-09-17 DIAGNOSIS — H60333 Swimmer's ear, bilateral: Secondary | ICD-10-CM | POA: Diagnosis not present

## 2018-10-01 ENCOUNTER — Other Ambulatory Visit: Payer: Self-pay | Admitting: Cardiovascular Disease

## 2018-10-03 ENCOUNTER — Ambulatory Visit (INDEPENDENT_AMBULATORY_CARE_PROVIDER_SITE_OTHER): Payer: Medicare HMO | Admitting: *Deleted

## 2018-10-03 DIAGNOSIS — I428 Other cardiomyopathies: Secondary | ICD-10-CM | POA: Diagnosis not present

## 2018-10-03 LAB — CUP PACEART REMOTE DEVICE CHECK
Battery Remaining Longevity: 135 mo
Battery Voltage: 3.12 V
Brady Statistic RV Percent Paced: 0.01 %
Date Time Interrogation Session: 20200520073323
HighPow Impedance: 69 Ohm
Implantable Lead Implant Date: 20191030
Implantable Lead Location: 753860
Implantable Pulse Generator Implant Date: 20191030
Lead Channel Impedance Value: 342 Ohm
Lead Channel Impedance Value: 399 Ohm
Lead Channel Pacing Threshold Amplitude: 0.625 V
Lead Channel Pacing Threshold Pulse Width: 0.4 ms
Lead Channel Sensing Intrinsic Amplitude: 20.25 mV
Lead Channel Sensing Intrinsic Amplitude: 20.25 mV
Lead Channel Setting Pacing Amplitude: 2 V
Lead Channel Setting Pacing Pulse Width: 0.4 ms
Lead Channel Setting Sensing Sensitivity: 0.3 mV

## 2018-10-04 ENCOUNTER — Other Ambulatory Visit: Payer: Self-pay

## 2018-10-12 ENCOUNTER — Encounter: Payer: Self-pay | Admitting: Cardiology

## 2018-10-12 NOTE — Progress Notes (Signed)
Remote ICD transmission.   

## 2018-10-15 ENCOUNTER — Ambulatory Visit (INDEPENDENT_AMBULATORY_CARE_PROVIDER_SITE_OTHER): Payer: Medicare HMO

## 2018-10-15 ENCOUNTER — Ambulatory Visit (HOSPITAL_BASED_OUTPATIENT_CLINIC_OR_DEPARTMENT_OTHER)
Admission: RE | Admit: 2018-10-15 | Discharge: 2018-10-15 | Disposition: A | Discharge status: Home / Self Care | Payer: Medicare HMO | Source: Ambulatory Visit | Attending: Pulmonary Disease | Admitting: Pulmonary Disease

## 2018-10-15 ENCOUNTER — Other Ambulatory Visit: Payer: Self-pay

## 2018-10-15 DIAGNOSIS — Z95810 Presence of automatic (implantable) cardiac defibrillator: Secondary | ICD-10-CM | POA: Diagnosis not present

## 2018-10-15 DIAGNOSIS — J849 Interstitial pulmonary disease, unspecified: Secondary | ICD-10-CM | POA: Diagnosis not present

## 2018-10-15 DIAGNOSIS — I5042 Chronic combined systolic (congestive) and diastolic (congestive) heart failure: Secondary | ICD-10-CM | POA: Diagnosis not present

## 2018-10-17 ENCOUNTER — Telehealth: Payer: Self-pay

## 2018-10-17 NOTE — Progress Notes (Signed)
EPIC Encounter for ICM Monitoring  Patient Name: Patricia Morgan is a 68 y.o. female Date: 10/17/2018 Primary Care Physican: Antony Contras, MD Primary Cardiologist:Croitoru Electrophysiologist:Croitoru 4/28/2020Weight:195lbs 10/17/2018 Weight: 190-195 lbs   Heart failure questions reviewed. She reports she is feeling fine and denies any fluid symptoms.  She has my chart but forgotten how to log in.  Provided mychart help number.      Optivol Thoracic impedance normal.  Prescribed by nephrologist to take Furosemide40 mg1 tablet (40 mg) every other morning.  May take an extra 40 mg for fluid symptoms.   Recommendations: No changes and encouraged to call for any fluid symptoms.   Follow-up plan: ICM clinic phone appointment on 11/19/2018.    Copy of ICM check sent to Dr. Sallyanne Kuster.    3 month ICM trend: 10/15/2018   1 Year ICM trend:       Rosalene Billings, RN 10/17/2018 11:38 AM

## 2018-10-17 NOTE — Progress Notes (Signed)
Super, thanks!

## 2018-10-17 NOTE — Telephone Encounter (Signed)
Remote ICM transmission received.  Attempted call to patient regarding ICM remote transmission and left message, per DPR, to return call.    

## 2018-10-29 DIAGNOSIS — M7502 Adhesive capsulitis of left shoulder: Secondary | ICD-10-CM | POA: Diagnosis not present

## 2018-10-30 DIAGNOSIS — M7502 Adhesive capsulitis of left shoulder: Secondary | ICD-10-CM | POA: Diagnosis not present

## 2018-11-01 DIAGNOSIS — M7502 Adhesive capsulitis of left shoulder: Secondary | ICD-10-CM | POA: Diagnosis not present

## 2018-11-05 ENCOUNTER — Other Ambulatory Visit: Payer: Self-pay | Admitting: Orthopedic Surgery

## 2018-11-05 DIAGNOSIS — M7502 Adhesive capsulitis of left shoulder: Secondary | ICD-10-CM

## 2018-11-06 ENCOUNTER — Telehealth: Payer: Self-pay | Admitting: Pulmonary Disease

## 2018-11-06 NOTE — Telephone Encounter (Signed)
Attempted to call pt but unable to reach.left message for pt to return call. 

## 2018-11-07 NOTE — Telephone Encounter (Signed)
ATC Patient.  LMTCB.

## 2018-11-08 NOTE — Telephone Encounter (Signed)
Returned call to patient, she states she just wanted to set up a f/u visit via video for a check up. Patient states she does not know how to use the [employer]. Walked patient through use of Community education officer. Appt made. Nothing further is needed at this time.

## 2018-11-09 DIAGNOSIS — M7502 Adhesive capsulitis of left shoulder: Secondary | ICD-10-CM | POA: Diagnosis not present

## 2018-11-12 ENCOUNTER — Other Ambulatory Visit: Payer: Self-pay

## 2018-11-12 ENCOUNTER — Ambulatory Visit (INDEPENDENT_AMBULATORY_CARE_PROVIDER_SITE_OTHER): Payer: Medicare HMO | Admitting: Nurse Practitioner

## 2018-11-12 ENCOUNTER — Encounter: Payer: Self-pay | Admitting: Nurse Practitioner

## 2018-11-12 DIAGNOSIS — D869 Sarcoidosis, unspecified: Secondary | ICD-10-CM | POA: Diagnosis not present

## 2018-11-12 NOTE — Assessment & Plan Note (Signed)
Patient recently had a follow-up CT scan earlier this month.  CT was stable from last scan favoring sarcoidosis.  States that this is been a stable interval for her.  She last saw Dr. Elsworth Soho on 04/23/2018.  She states that she is not having any significant shortness of breath or any significant cough.  Denies any significant peripheral edema.  Is active and performs activities of daily living independently.  She states that overall she is doing well and feels that she is stable.   Patient Instructions  Sarcoidosis: Will order follow up PFT in 6 months  OSA: Patient continues to benefit from CPAP with good compliance and control documented Continue CPAP at current settings Continue current medications Goal of 4 hours or more usage per night Maintain healthy weight Do not drive if drowsy  Follow up: Follow up with Dr. Elsworth Soho in 6 months with PFT same day as appointment or sooner if needed

## 2018-11-12 NOTE — Progress Notes (Signed)
Virtual Visit via Telephone Note  I connected with Patricia Morgan on 11/12/18 at  2:30 PM EDT by telephone and verified that I am speaking with the correct person using two identifiers.  Location: Patient: home Provider: office   I discussed the limitations, risks, security and privacy concerns of performing an evaluation and management service by telephone and the availability of in person appointments. I also discussed with the patient that there may be a patient responsible charge related to this service. The patient expressed understanding and agreed to proceed.   History of Present Illness: 68 year old female with nonischemic cardiomyopathy, OSA, sarcoid who is followed by Dr. Elsworth Soho.  PMH: CHF worsened 10/19 with stress test showing inferior wall defect -status post AICD.  Patient is now on Eliquis.  Positive rheumatoid arthritis factor and for 2017  Patient has a tele-visit today for follow-up.  She recently had a follow-up CT scan earlier this month.  CT was stable from last scan favoring sarcoidosis.  States that this is been a stable interval for her.  She last saw Dr. Elsworth Soho on 04/23/2018.  She states that she is not having any significant shortness of breath or any significant cough.  Denies any significant peripheral edema.  Is active and performs activities of daily living independently.  She states that overall she is doing well and feels that she is stable. Denies f/c/s, n/v/d, hemoptysis, PND, leg swelling.  Patient also has sleep apnea and uses CPAP at night.  Recent CPAP compliance report shows 100% usage.  Patient states that she wears CPAP nightly and benefits from use.  She feels much less drowsy throughout the day.  No new concerns or issues related to her CPAP, pressure, or mask.  Observations/Objective:  CPAP compliance report 10/12/18 - 11/10/18: usage days 30/30 (100%), average usage 6 hours 13 minutes, CPAP set pressure 11 cmH20, AHI: 6.1  HRCT 10/15/18 - No significant  interval change in irregular nodular ground-glass and interstitial pulmonary opacity seen in a slightly apically predominant distribution. Although not significantly changed compared to immediate prior examination, there is a somewhat more coarse and chronic appearance in comparison to more remote CT dated 08/30/2015. There is mild lobular air trapping and diffuse bilateral bronchial wall thickening. No significant bronchiectasis or bronchiolectasis. Findings are generally consistent with chronic hypersensitivity pneumonitis, other inhalational inflammatory interstitial lung disease, or pulmonary sarcoidosis. Biopsy may be helpful to establish definitive diagnosis.   HRCT 03/2018 >>Interstitial thickening and nodularity appear slightly more organized when compared with 08/30/2015. Findings are upper and midlung zone predominant , borderline enlarged/calcified mediastinal/hilar lymph nodes>> favor sarcoid   PFTs 02/23/2018-this showed no evidence of airway obstruction with ratio of 88, FEV1 of 82%. There was moderate restriction with FVC of 72% and DLCO 64%. This appears to be intraparenchymal with DLCO 45% that corrected to 76% for alveolar volume.  CT chest 08/2015 shows centrilobular groundglass infiltrates diffusely  NPSG 2006: AHI 16/hr Auto titration 2012: Optimal pressure 12cm  MSLT 11/2011 >> SOREM x 1  Serology 03/2018 ANA neg, ACE nml,   Assessment and Plan: Patient recently had a follow-up CT scan earlier this month.  CT was stable from last scan favoring sarcoidosis.  States that this is been a stable interval for her.  She last saw Dr. Elsworth Soho on 04/23/2018.  She states that she is not having any significant shortness of breath or any significant cough.  Denies any significant peripheral edema.  Is active and performs activities of daily living independently.  She states  that overall she is doing well and feels that she is stable.   Patient Instructions  Sarcoidosis: Will  order follow up PFT in 6 months  OSA: Patient continues to benefit from CPAP with good compliance and control documented Continue CPAP at current settings Continue current medications Goal of 4 hours or more usage per night Maintain healthy weight Do not drive if drowsy    Follow Up Instructions: Follow up with Dr. Elsworth Soho in 6 months with PFT same day as appointment or sooner if needed    I discussed the assessment and treatment plan with the patient. The patient was provided an opportunity to ask questions and all were answered. The patient agreed with the plan and demonstrated an understanding of the instructions.   The patient was advised to call back or seek an in-person evaluation if the symptoms worsen or if the condition fails to improve as anticipated.  I provided 22 minutes of non-face-to-face time during this encounter.   Fenton Foy, NP

## 2018-11-12 NOTE — Patient Instructions (Signed)
Sarcoidosis: Will order follow up PFT in 6 months  OSA: Patient continues to benefit from CPAP with good compliance and control documented Continue CPAP at current settings Continue current medications Goal of 4 hours or more usage per night Maintain healthy weight Do not drive if drowsy  Follow up: Follow up with Dr. Elsworth Soho in 6 months with PFT same day as appointment or sooner if needed

## 2018-11-13 DIAGNOSIS — G4733 Obstructive sleep apnea (adult) (pediatric): Secondary | ICD-10-CM | POA: Diagnosis not present

## 2018-11-19 ENCOUNTER — Ambulatory Visit (INDEPENDENT_AMBULATORY_CARE_PROVIDER_SITE_OTHER): Payer: Medicare HMO

## 2018-11-19 DIAGNOSIS — I5042 Chronic combined systolic (congestive) and diastolic (congestive) heart failure: Secondary | ICD-10-CM

## 2018-11-19 DIAGNOSIS — Z95810 Presence of automatic (implantable) cardiac defibrillator: Secondary | ICD-10-CM

## 2018-11-19 NOTE — Progress Notes (Signed)
EPIC Encounter for ICM Monitoring  Patient Name: Patricia Morgan is a 68 y.o. female Date: 11/19/2018 Primary Care Physican: Antony Contras, MD Primary Cardiologist:Croitoru Electrophysiologist:Croitoru Nephrologist: Moshe Cipro 10/17/2018 Weight: 190-195 lbs 11/19/2018 Weight: 195 lbs   Heart failure questions reviewed. She reports weight increased a few pounds and highest weight in the last 10 days was  197 lbs with shortness of breath.  Today she is 195 lb which is still above her baseline and prefers to be closer to 190 lbs.  She is not having any breathing difficulty today.  She is asking if her device is compatible with MRI and CT scan and will call her back with the information.  OptivolThoracic impedance abnormal suggesting possible fluid accumulation since 11/07/2018 and is ongoing.   Prescribedby nephrologist to takeFurosemide40 mg1 tablet (40 mg)every day and may take extra 40 mg bid every other day if she has fluid symptoms.  Recommendations: Advised to continue to follow nephrologist instructions on taking Furosemide and to call that office if she feels the fluid pill is not resolving symptoms.  Advised her to limit salt intake to 2000 mg daily and fluid intake to 64 oz a day.  She said the kidney doctor has told her to increase fluid intake and advised her to ask for a specific amount.   Follow-up plan: ICM clinic phone appointment on7/14/2020 to recheck fluid levels.   Copy of ICM check sent to Dr.Croitoru.    3 month ICM trend: 11/19/2018     1 Year ICM trend:       Rosalene Billings, RN 11/19/2018 2:04 PM

## 2018-11-19 NOTE — Progress Notes (Signed)
Call back to patient and advised Dr C said she can have CT scan anytime but with MRI a rep would need to to come in at the time of the procedure to change device settings.  She said that was fine and thanks for the information.

## 2018-11-20 NOTE — Progress Notes (Signed)
She can have CT without precautions related to her device (but needs to be aware of potential kidney issues if contrast is used). She can have an MRI, but we need to adjust the settings on her device before each time an MRI is done. If an MRI is needed, please give [place] several days notice to arrange the device reprogramming, please

## 2018-11-21 DIAGNOSIS — C50911 Malignant neoplasm of unspecified site of right female breast: Secondary | ICD-10-CM | POA: Diagnosis not present

## 2018-11-28 DIAGNOSIS — C50911 Malignant neoplasm of unspecified site of right female breast: Secondary | ICD-10-CM | POA: Diagnosis not present

## 2018-12-04 DIAGNOSIS — C50911 Malignant neoplasm of unspecified site of right female breast: Secondary | ICD-10-CM | POA: Diagnosis not present

## 2018-12-05 ENCOUNTER — Ambulatory Visit
Admission: RE | Admit: 2018-12-05 | Discharge: 2018-12-05 | Disposition: A | Discharge status: Home / Self Care | Payer: Medicare HMO | Source: Ambulatory Visit | Attending: Orthopedic Surgery | Admitting: Orthopedic Surgery

## 2018-12-05 DIAGNOSIS — M7502 Adhesive capsulitis of left shoulder: Secondary | ICD-10-CM

## 2018-12-05 DIAGNOSIS — M75122 Complete rotator cuff tear or rupture of left shoulder, not specified as traumatic: Secondary | ICD-10-CM | POA: Diagnosis not present

## 2018-12-05 MED ORDER — IOPAMIDOL (ISOVUE-M 200) INJECTION 41%
15.0000 mL | Freq: Once | INTRAMUSCULAR | Status: AC
  Administered 2018-12-05: 16:00:00 20 mL via INTRA_ARTICULAR

## 2018-12-11 NOTE — Progress Notes (Signed)
No ICM remote transmission received for 11/27/2018 and next ICM transmission scheduled for 12/24/2018.

## 2018-12-14 ENCOUNTER — Telehealth: Payer: Self-pay | Admitting: Cardiovascular Disease

## 2018-12-14 NOTE — Telephone Encounter (Signed)
New message:    Patient calling concerning a surgry soon and states some one needs to be with her because she a device. Pease call patient.

## 2018-12-14 NOTE — Telephone Encounter (Signed)
Spoke to patient she stated she needed rotor cuff surgery by Dr.Rodgers at [facility].Stated she was told when she had ICD put in to call if she ever needed surgery someone from heart care would need to be present during surgery to monitor ICD.Stated Dr.Croitoru needs to call Dr.Rodgers to coordinate.Advised I will send message to Dr.Croitoru.

## 2018-12-16 NOTE — Telephone Encounter (Signed)
Which shoulder? If it's the left shoulder (same side as the ICD), we need to reprogram the device before the procedure. If it is the opposite side, can just tape a magnet over the ICD during the surgery. She is at low risk for CV complications with rotator cuff surgery, just need to clarify the above. MCr

## 2018-12-17 ENCOUNTER — Encounter: Payer: Self-pay | Admitting: Cardiovascular Disease

## 2018-12-18 ENCOUNTER — Telehealth: Payer: Self-pay

## 2018-12-18 ENCOUNTER — Telehealth: Payer: Self-pay | Admitting: Cardiovascular Disease

## 2018-12-18 NOTE — Telephone Encounter (Signed)
Follow up   Patient states that it is her left shoulder and is following up on the previous message. Please call.

## 2018-12-18 NOTE — Telephone Encounter (Signed)
Spoke to patient she stated she is having left rotor cuff surgery.Advised I will call Dr.Rodger's office.Left message at Dr Dr.Rodger's office Dr.Croitoru advised patient is low risk for rotor cuff surgery.Advised to call office when surgery is scheduled so we can schedule a rep to be present during her surgery.

## 2018-12-18 NOTE — Telephone Encounter (Signed)
New message:     Patient returning call back from a few weeks ago concering her device. Patient would like for some one to call her concerning this matter

## 2018-12-18 NOTE — Telephone Encounter (Signed)
Received a call from [facility] at [place] office.She stated she was unaware patient needs left rotor cuff surgery.Stated she will ask Dr.Rodgers and if patient needs rotor cuff surgery she will send over a surgical clearance.

## 2018-12-18 NOTE — Telephone Encounter (Signed)
Dr. Stann Mainland sent me an epic message regarding left rotator cuff surgery.

## 2018-12-18 NOTE — Telephone Encounter (Signed)
Received message from Dr.CroitoruMalachy Mood, LPN will call back with those recommendations.

## 2018-12-19 ENCOUNTER — Telehealth: Payer: Self-pay

## 2018-12-19 NOTE — Telephone Encounter (Signed)
Returned patient call as requested by voice mail message.  She said she received a call in the last couple of days saying that the device was not working because no transmission was received. She tried returning the call to the phone number that was left but unable to reach anyone.  Advised there is not a note regarding any transmissions problems in the last 2 days and no documented call from the device clinic.  Assisted her in sending a remote transmission to alleviate the anxiety that her device is not working.  Transmission received and reviewed with patient.  Advised Dr Croitoru's office is addressing surgical clearance.  No further questions.

## 2018-12-21 ENCOUNTER — Telehealth: Payer: Self-pay

## 2018-12-21 NOTE — Telephone Encounter (Signed)
   [facility] Pre-operative Risk Assessment    Request for surgical clearance:  1. What type of surgery is being performed? LEFT SHOULDER SCOPE RCR SAD   2. When is this surgery scheduled? TBD   3. What type of clearance is required (medical clearance vs. Pharmacy clearance to hold med vs. Both)? BOTH  4. Are there any medications that need to be held prior to surgery and how long? NOT LISTED-PT TAKES ELIQUIS   5. Practice name and name of physician performing surgery? [facility]   6. What is your office phone number [phone]    7.   What is your office fax number  [phone]  8.   Anesthesia type (None, local, MAC, general) ? NOT LISTED   Waylan Rocher 12/21/2018, 8:05 AM  _________________________________________________________________   (provider comments below)

## 2018-12-24 ENCOUNTER — Ambulatory Visit (INDEPENDENT_AMBULATORY_CARE_PROVIDER_SITE_OTHER): Payer: Medicare HMO

## 2018-12-24 ENCOUNTER — Other Ambulatory Visit: Payer: Self-pay | Admitting: Family Medicine

## 2018-12-24 DIAGNOSIS — Z1231 Encounter for screening mammogram for malignant neoplasm of breast: Secondary | ICD-10-CM

## 2018-12-24 DIAGNOSIS — Z95810 Presence of automatic (implantable) cardiac defibrillator: Secondary | ICD-10-CM

## 2018-12-24 DIAGNOSIS — I5042 Chronic combined systolic (congestive) and diastolic (congestive) heart failure: Secondary | ICD-10-CM | POA: Diagnosis not present

## 2018-12-24 NOTE — Telephone Encounter (Signed)
Please comment on eliquis. 

## 2018-12-24 NOTE — Telephone Encounter (Signed)
Pt takes Eliquis for DVT and PE, being prescribed by Dr Marin Olp - would recommend forwarding clearance to managing provider.

## 2018-12-25 NOTE — Telephone Encounter (Signed)
   Primary Cardiologist: Sanda Klein, MD  Chart reviewed as part of pre-operative protocol coverage. Patient was contacted 12/25/2018 in reference to pre-operative risk assessment for pending surgery as outlined below.  Patricia Morgan was last seen on 06/20/18 by Dr. Sallyanne Kuster.   Per Dr. Sallyanne Kuster:  She is at low risk for CV complications with rotator cuff surgery. For left shoulder surgery, surgeon's office will need to coordinate rep for her device.   Per our clinical pharmacist regarding anticoagulation: Pt takes Eliquis for DVT and PE, being prescribed by Dr Marin Olp - would recommend forwarding clearance to managing provider.  Therefore, based on ACC/AHA guidelines, the patient would be at acceptable risk for the planned procedure without further cardiovascular testing.   I will route this recommendation to the requesting party via Epic fax function and remove from pre-op pool.  Please call with questions.  [place], PA 12/25/2018, 8:52 AM

## 2018-12-26 ENCOUNTER — Ambulatory Visit
Admission: RE | Admit: 2018-12-26 | Discharge: 2018-12-26 | Disposition: A | Discharge status: Home / Self Care | Payer: Medicare HMO | Source: Ambulatory Visit | Attending: Family Medicine | Admitting: Family Medicine

## 2018-12-26 ENCOUNTER — Other Ambulatory Visit: Payer: Self-pay

## 2018-12-26 DIAGNOSIS — Z1231 Encounter for screening mammogram for malignant neoplasm of breast: Secondary | ICD-10-CM | POA: Diagnosis not present

## 2018-12-26 NOTE — Progress Notes (Signed)
Looks great!

## 2018-12-26 NOTE — Progress Notes (Signed)
EPIC Encounter for ICM Monitoring  Patient Name: Patricia Morgan is a 68 y.o. female Date: 12/26/2018 Primary Care Physican: Antony Contras, MD Primary Cardiologist:Croitoru Electrophysiologist:Croitoru Nephrologist: Moshe Cipro 10/17/2018 Weight: 190-195 lbs 11/19/2018 Weight: 195 lbs   Heart failure questions reviewed.  OptivolThoracic impedance normal.  Prescribedby nephrologist to takeFurosemide40 mg1 tablet (40 mg)every day and may take extra 40 mg bid every other day if she has fluid symptoms.  Recommendations: No changes and encouraged to call if experiencing any fluid symptoms.  Follow-up plan: ICM clinic phone appointment on10/19/2020 since she has a defib check in the office with Dr Sallyanne Kuster 02/04/2019.  Copy of ICM check sent to Dr.Croitoru.   3 month ICM trend: 12/24/2018    1 Year ICM trend:       Rosalene Billings, RN 12/26/2018 12:16 PM

## 2018-12-28 ENCOUNTER — Other Ambulatory Visit: Payer: Self-pay | Admitting: Cardiovascular Disease

## 2019-01-02 ENCOUNTER — Ambulatory Visit (INDEPENDENT_AMBULATORY_CARE_PROVIDER_SITE_OTHER): Payer: Medicare HMO | Admitting: *Deleted

## 2019-01-02 DIAGNOSIS — I428 Other cardiomyopathies: Secondary | ICD-10-CM

## 2019-01-02 LAB — CUP PACEART REMOTE DEVICE CHECK
Battery Remaining Longevity: 134 mo
Battery Voltage: 3.08 V
Brady Statistic RV Percent Paced: 0.01 %
Date Time Interrogation Session: 20200819103527
HighPow Impedance: 69 Ohm
Implantable Lead Implant Date: 20191030
Implantable Lead Location: 753860
Implantable Pulse Generator Implant Date: 20191030
Lead Channel Impedance Value: 304 Ohm
Lead Channel Impedance Value: 399 Ohm
Lead Channel Pacing Threshold Amplitude: 0.625 V
Lead Channel Pacing Threshold Pulse Width: 0.4 ms
Lead Channel Sensing Intrinsic Amplitude: 21.875 mV
Lead Channel Sensing Intrinsic Amplitude: 21.875 mV
Lead Channel Setting Pacing Amplitude: 2 V
Lead Channel Setting Pacing Pulse Width: 0.4 ms
Lead Channel Setting Sensing Sensitivity: 0.3 mV

## 2019-01-03 ENCOUNTER — Inpatient Hospital Stay: Payer: Medicare HMO

## 2019-01-03 ENCOUNTER — Inpatient Hospital Stay: Payer: Medicare HMO | Admitting: Hematology & Oncology

## 2019-01-09 ENCOUNTER — Other Ambulatory Visit: Payer: Self-pay

## 2019-01-09 ENCOUNTER — Encounter: Payer: Self-pay | Admitting: Hematology & Oncology

## 2019-01-09 ENCOUNTER — Inpatient Hospital Stay: Payer: Medicare HMO | Attending: Hematology & Oncology

## 2019-01-09 ENCOUNTER — Telehealth: Payer: Self-pay | Admitting: Hematology & Oncology

## 2019-01-09 ENCOUNTER — Inpatient Hospital Stay (HOSPITAL_BASED_OUTPATIENT_CLINIC_OR_DEPARTMENT_OTHER): Payer: Medicare HMO | Admitting: Hematology & Oncology

## 2019-01-09 VITALS — BP 107/82 | HR 77 | Temp 98.0°F | Resp 20 | Wt 207.0 lb

## 2019-01-09 DIAGNOSIS — Z9011 Acquired absence of right breast and nipple: Secondary | ICD-10-CM | POA: Diagnosis not present

## 2019-01-09 DIAGNOSIS — Z853 Personal history of malignant neoplasm of breast: Secondary | ICD-10-CM

## 2019-01-09 DIAGNOSIS — Z79899 Other long term (current) drug therapy: Secondary | ICD-10-CM | POA: Diagnosis not present

## 2019-01-09 DIAGNOSIS — D6851 Activated protein C resistance: Secondary | ICD-10-CM | POA: Diagnosis not present

## 2019-01-09 DIAGNOSIS — Z794 Long term (current) use of insulin: Secondary | ICD-10-CM | POA: Insufficient documentation

## 2019-01-09 DIAGNOSIS — N289 Disorder of kidney and ureter, unspecified: Secondary | ICD-10-CM | POA: Diagnosis not present

## 2019-01-09 DIAGNOSIS — Z7901 Long term (current) use of anticoagulants: Secondary | ICD-10-CM | POA: Insufficient documentation

## 2019-01-09 DIAGNOSIS — Z86718 Personal history of other venous thrombosis and embolism: Secondary | ICD-10-CM | POA: Insufficient documentation

## 2019-01-09 DIAGNOSIS — I82401 Acute embolism and thrombosis of unspecified deep veins of right lower extremity: Secondary | ICD-10-CM

## 2019-01-09 LAB — CBC WITH DIFFERENTIAL (CANCER CENTER ONLY)
Abs Immature Granulocytes: 0.02 10*3/uL (ref 0.00–0.07)
Basophils Absolute: 0 10*3/uL (ref 0.0–0.1)
Basophils Relative: 1 %
Eosinophils Absolute: 0.1 10*3/uL (ref 0.0–0.5)
Eosinophils Relative: 2 %
HCT: 33.9 % — ABNORMAL LOW (ref 36.0–46.0)
Hemoglobin: 10.6 g/dL — ABNORMAL LOW (ref 12.0–15.0)
Immature Granulocytes: 1 %
Lymphocytes Relative: 34 %
Lymphs Abs: 1.4 10*3/uL (ref 0.7–4.0)
MCH: 31.3 pg (ref 26.0–34.0)
MCHC: 31.3 g/dL (ref 30.0–36.0)
MCV: 100 fL (ref 80.0–100.0)
Monocytes Absolute: 0.3 10*3/uL (ref 0.1–1.0)
Monocytes Relative: 8 %
Neutro Abs: 2.3 10*3/uL (ref 1.7–7.7)
Neutrophils Relative %: 54 %
Platelet Count: 129 10*3/uL — ABNORMAL LOW (ref 150–400)
RBC: 3.39 MIL/uL — ABNORMAL LOW (ref 3.87–5.11)
RDW: 16 % — ABNORMAL HIGH (ref 11.5–15.5)
WBC Count: 4.1 10*3/uL (ref 4.0–10.5)
nRBC: 0 % (ref 0.0–0.2)

## 2019-01-09 LAB — CMP (CANCER CENTER ONLY)
ALT: 19 U/L (ref 0–44)
AST: 27 U/L (ref 15–41)
Albumin: 3.5 g/dL (ref 3.5–5.0)
Alkaline Phosphatase: 109 U/L (ref 38–126)
Anion gap: 7 (ref 5–15)
BUN: 34 mg/dL — ABNORMAL HIGH (ref 8–23)
CO2: 24 mmol/L (ref 22–32)
Calcium: 9.5 mg/dL (ref 8.9–10.3)
Chloride: 110 mmol/L (ref 98–111)
Creatinine: 2.47 mg/dL — ABNORMAL HIGH (ref 0.44–1.00)
GFR, Est AFR Am: 22 mL/min — ABNORMAL LOW (ref >60)
GFR, Estimated: 19 mL/min — ABNORMAL LOW (ref >60)
Glucose, Bld: 146 mg/dL — ABNORMAL HIGH (ref 70–99)
Potassium: 3.8 mmol/L (ref 3.5–5.1)
Sodium: 141 mmol/L (ref 135–145)
Total Bilirubin: 1 mg/dL (ref 0.3–1.2)
Total Protein: 6.8 g/dL (ref 6.5–8.1)

## 2019-01-09 LAB — LACTATE DEHYDROGENASE: LDH: 203 U/L — ABNORMAL HIGH (ref 98–192)

## 2019-01-09 NOTE — Progress Notes (Signed)
Hematology and Oncology Follow Up Visit  Patricia Morgan [id number] [birth year] 68 y.o. 01/09/2019   Principle Diagnosis:  Recurrent Thromboembolic disease Factor V Leiden - Heterozygote H/O locally recurrent ductal carcinoma - RIGHT breast  Current Therapy:   Eliquis 5 mg po BID   Interim History:  Patricia Morgan is here today for follow-up.unfortunately, she tore her rotator cuff on the left shoulder.  This was actually back in February.  She is going to have surgery for this.  She is not sure when surgery will be.  We did send the clearance form to her surgeon.  She is on Eliquis.  We said stop the Eliquis 2 days before surgery and then restart the day after surgery.  I do not see any problems with her being off Eliquis or with her having bleeding.  I think a bigger issue for her might be anemia.  She does have renal insufficiency.  We will see what her erythropoietin level is.  She is followed by nephrology.  I am sure that she has a low erythropoietin level.  There is been no bleeding.  She has had no nausea or vomiting.  She has had no cough.  There has been no headache.  She has had no rashes.  There is been no cough or shortness of breath.  Overall, her performance status is ECOG 1.    Medications:  Allergies as of 01/09/2019   No Known Allergies     Medication List       Accurate as of January 09, 2019  8:11 AM. If you have any questions, ask your nurse or doctor.        acetaminophen 500 MG tablet Commonly known as: TYLENOL Take 500 mg by mouth every 6 (six) hours as needed for mild pain.   allopurinol 100 MG tablet Commonly known as: ZYLOPRIM Take 100 mg by mouth 2 (two) times daily.   apixaban 5 MG Tabs tablet Commonly known as: Eliquis Take 1 tablet (5 mg total) by mouth 2 (two) times daily.   carvedilol 25 MG tablet Commonly known as: COREG TAKE 1 TABLET TWICE DAILY WITH MEALS   colchicine 0.6 MG tablet Take 0.6 mg by mouth daily.   Cortizone-10  Diabetics Skin 1 % lotion Generic drug: hydrocortisone   furosemide 40 MG tablet Commonly known as: LASIX Take 2 tablets (80 mg total) by mouth 2 (two) times daily. Take 80 mg in the morning and 40 mg in the evening What changed:   how much to take  when to take this  additional instructions   HumaLOG KwikPen 100 UNIT/ML KiwkPen Generic drug: insulin lispro Inject 10 Units into the skin 3 (three) times daily as needed (for blood sugar greater than 200.).   hydrALAZINE 10 MG tablet Commonly known as: APRESOLINE TAKE 1 TABLET (10 MG) BY MOUTH THREE TIMES DAILY   isosorbide mononitrate 30 MG 24 hr tablet Commonly known as: IMDUR TAKE 1 TABLET (30 MG) DAILY   Lantus SoloStar 100 UNIT/ML Solostar Pen Generic drug: Insulin Glargine Inject 15 Units into the skin daily as needed (for blood sugar greater than 200.). Per sliding scale.   PROBIOTIC PO Take 1 capsule by mouth daily.   rosuvastatin 10 MG tablet Commonly known as: CRESTOR TAKE 1 TABLET EVERY DAY   True Metrix Air Glucose Meter w/Device Kit   True Metrix Blood Glucose Test test strip Generic drug: glucose blood   TRUEdraw Lancing Device Misc TRUEdraw Environmental consultant  vitamin B-12 1000 MCG tablet Commonly known as: CYANOCOBALAMIN Take 1,000 mcg by mouth daily.   Vitamin D-3 125 MCG (5000 UT) Tabs Take 5,000 Units by mouth daily.       Allergies: No Known Allergies  Past Medical History, Surgical history, Social history, and Family History were reviewed and updated.  Review of Systems: Review of Systems  Constitutional: Negative.   HENT: Negative.   Eyes: Negative.   Respiratory: Negative.   Cardiovascular: Negative.   Gastrointestinal: Negative.   Genitourinary: Negative.   Musculoskeletal: Positive for joint pain.  Skin: Negative.   Neurological: Negative.   Endo/Heme/Allergies: Negative.   Psychiatric/Behavioral: Negative.      Physical Exam:  weight  is 207 lb (93.9 kg). Her oral temperature is 98 F (36.7 C). Her blood pressure is 107/82 and her pulse is 77. Her respiration is 20 and oxygen saturation is 97%.   Wt Readings from Last 3 Encounters:  01/09/19 207 lb (93.9 kg)  07/05/18 201 lb 12 oz (91.5 kg)  06/20/18 204 lb 6.4 oz (92.7 kg)    Physical Exam Vitals signs reviewed.  HENT:     Head: Normocephalic and atraumatic.  Eyes:     Pupils: Pupils are equal, round, and reactive to light.  Neck:     Musculoskeletal: Normal range of motion.  Cardiovascular:     Rate and Rhythm: Normal rate and regular rhythm.     Heart sounds: Normal heart sounds.  Pulmonary:     Effort: Pulmonary effort is normal.     Breath sounds: Normal breath sounds.  Abdominal:     General: Bowel sounds are normal.     Palpations: Abdomen is soft.  Musculoskeletal: Normal range of motion.        General: No tenderness or deformity.  Lymphadenopathy:     Cervical: No cervical adenopathy.  Skin:    General: Skin is warm and dry.     Findings: No erythema or rash.  Neurological:     Mental Status: She is alert and oriented to person, place, and time.  Psychiatric:        Behavior: Behavior normal.        Thought Content: Thought content normal.        Judgment: Judgment normal.      Lab Results  Component Value Date   WBC 4.1 01/09/2019   HGB 10.6 (L) 01/09/2019   HCT 33.9 (L) 01/09/2019   MCV 100.0 01/09/2019   PLT 129 (L) 01/09/2019   Lab Results  Component Value Date   FERRITIN 163 07/16/2012   IRON 83 07/16/2012   TIBC 379 07/16/2012   UIBC 296 07/16/2012   IRONPCTSAT 22 07/16/2012   Lab Results  Component Value Date   RETICCTPCT 2.1 07/16/2012   RBC 3.39 (L) 01/09/2019   RETICCTABS 83.6 07/16/2012   No results found for: KPAFRELGTCHN, LAMBDASER, KAPLAMBRATIO No results found for: IGGSERUM, IGA, IGMSERUM No results found for: Odetta Pink, SPEI   Chemistry       Component Value Date/Time   NA 140 07/05/2018 1013   NA 142 03/08/2018 1640   NA 141 12/07/2016 1300   NA 143 03/18/2016 0944   K 4.3 07/05/2018 1013   K 4.7 12/07/2016 1300   K 4.9 03/18/2016 0944   CL 105 07/05/2018 1013   CL 103 12/07/2016 1300   CO2 27 07/05/2018 1013   CO2 29 12/07/2016 1300   CO2 27 03/18/2016 0944  BUN 54 (H) 07/05/2018 1013   BUN 42 (H) 03/08/2018 1640   BUN 45 (H) 12/07/2016 1300   BUN 29.0 (H) 03/18/2016 0944   CREATININE 2.83 (H) 07/05/2018 1013   CREATININE 2.6 (H) 12/07/2016 1300   CREATININE 2.1 (H) 03/18/2016 0944      Component Value Date/Time   CALCIUM 9.8 07/05/2018 1013   CALCIUM 9.9 12/07/2016 1300   CALCIUM 9.3 03/18/2016 0944   ALKPHOS 117 07/05/2018 1013   ALKPHOS 127 (H) 12/07/2016 1300   ALKPHOS 147 03/18/2016 0944   AST 21 07/05/2018 1013   AST 22 03/18/2016 0944   ALT 13 07/05/2018 1013   ALT 29 12/07/2016 1300   ALT 19 03/18/2016 0944   BILITOT 0.7 07/05/2018 1013   BILITOT 0.32 03/18/2016 0944       Impression and Plan: Ms. Posey is a very pleasant 68 yo African American female with history of recurrent thromboembolic disease on lifelong anticoagulation with Eliquis. She has done well with this and has no complaints.   Again, the big issue is the rotator cuff for her left shoulder.  Hopefully she will have surgery in a few weeks.  I want to see her back in 3 months.  I want to follow-up with her after surgery.  We also again have to begin to address the anemia.  It is not that bad right yet.  However, I suspect that she probably is going to have some issues if her erythropoietin level is on the low side.  We will have to be very cautious if we had to give her back ESA given her risk of thromboembolism.  Volanda Napoleon, MD 8/26/20208:11 AM

## 2019-01-09 NOTE — Telephone Encounter (Signed)
Spoke with patient to confirm Nov appts pre 8/26 los

## 2019-01-10 ENCOUNTER — Encounter: Payer: Self-pay | Admitting: Cardiology

## 2019-01-10 NOTE — Progress Notes (Signed)
Remote ICD transmission.   

## 2019-01-14 DIAGNOSIS — E1122 Type 2 diabetes mellitus with diabetic chronic kidney disease: Secondary | ICD-10-CM | POA: Diagnosis not present

## 2019-01-14 DIAGNOSIS — I129 Hypertensive chronic kidney disease with stage 1 through stage 4 chronic kidney disease, or unspecified chronic kidney disease: Secondary | ICD-10-CM | POA: Diagnosis not present

## 2019-01-14 DIAGNOSIS — Z6838 Body mass index (BMI) 38.0-38.9, adult: Secondary | ICD-10-CM | POA: Diagnosis not present

## 2019-01-14 DIAGNOSIS — M109 Gout, unspecified: Secondary | ICD-10-CM | POA: Diagnosis not present

## 2019-01-14 DIAGNOSIS — N183 Chronic kidney disease, stage 3 (moderate): Secondary | ICD-10-CM | POA: Diagnosis not present

## 2019-01-16 NOTE — Pre-Procedure Instructions (Addendum)
[id number] - [place] - [address] [place] [address] Phone: [phone] Fax: [phone]  [facility] - [place], [place] [address] Phone: [phone] Fax: [phone]      Your procedure is scheduled on Thursday, September 10th.  Report to [facility] Main Entrance "A" at 5:30 A.M., and check in at the Admitting office.  Call this number if you have problems the morning of surgery:  [phone]  Call [phone] if you have any questions prior to your surgery date Monday-Friday 8am-4pm    Remember:  Do not eat after midnight the night before your surgery  You may drink clear liquids until 4:30 the morning of your surgery.   Clear liquids allowed are: Water, Non-Citrus Juices (without pulp), Carbonated Beverages, Clear Tea, Black Coffee Only, and Gatorade    Take these medicines the morning of surgery with A SIP OF WATER  allopurinol (ZYLOPRIM) carvedilol (COREG)  colchicine  hydrALAZINE (APRESOLINE) isosorbide mononitrate (IMDUR)   If needed: acetaminophen (TYLENOL)  Follow your surgeon's instructions on when to stop/resume Eliquis.  If no instructions were given by your surgeon then you will need to call the office to get those instructions.     As of today, STOP taking any Aspirin (unless otherwise instructed by your surgeon), Aleve, Naproxen, Ibuprofen, Motrin, Advil, Goody's, BC's, all herbal medications, fish oil, and all vitamins.   Enhanced Recovery after Surgery for Orthopedics Enhanced Recovery after Surgery is a protocol used to improve the stress on your body and your recovery after surgery.  Patient Instructions  . The night before surgery:  o No food after midnight. ONLY clear liquids after midnight  . The day of surgery (if you have diabetes): o  o Drink ONE (1) Gatorade 2 (G2) as directed.  o This drink was given to you during your hospital  pre-op appointment visit.  o The pre-op nurse will instruct you on the time to drink the   Gatorade 2 (G2) depending on your surgery time. o Color of the Gatorade may vary. Red is not allowed. o Nothing else to drink after completing the  Gatorade 2 (G2).         If you have questions, please contact your surgeon's office.   WHAT DO I DO ABOUT MY DIABETES MEDICATION?   [name] Do not take oral diabetes medicines (pills) the morning of surgery.  . THE NIGHT BEFORE SURGERY, do NOT take insulin lispro (HUMALOG) insulin.       . THE MORNING OF SURGERY, take 5 units of insulin lispro (HUMALOG) insulin if CBG is greater than 220.   [name] The night before surgery and the morning of surgery, if your CBG is greater than 220 mg/dL, you may take  of your sliding scale (correction) dose of insulin. That would be 15 units of Insulin Glargine (LANTUS SOLOSTAR).     How to Manage Your Diabetes Before and After Surgery  Why is it important to control my blood sugar before and after surgery? . Improving blood sugar levels before and after surgery helps healing and can limit problems. . A way of improving blood sugar control is eating a healthy diet by: o  Eating less sugar and carbohydrates o  Increasing activity/exercise o  Talking with your doctor about reaching your blood sugar goals . High blood sugars (greater than 180  mg/dL) can raise your risk of infections and slow your recovery, so you will need to focus on controlling your diabetes during the weeks before surgery. . Make sure that the doctor who takes care of your diabetes knows about your planned surgery including the date and location.  How do I manage my blood sugar before surgery? . Check your blood sugar at least 4 times a day, starting 2 days before surgery, to make sure that the level is not too high or low. o Check your blood sugar the morning of your surgery when you wake up and every  2 hours until you get to the Short Stay unit. . If your blood sugar is less than 70 mg/dL, you will need to treat for low blood sugar: o Do not take insulin. o Treat a low blood sugar (less than 70 mg/dL) with  cup of clear juice (cranberry or apple), 4 glucose tablets, OR glucose gel. o Recheck blood sugar in 15 minutes after treatment (to make sure it is greater than 70 mg/dL). If your blood sugar is not greater than 70 mg/dL on recheck, call [phone] for further instructions. . Report your blood sugar to the short stay nurse when you get to Short Stay.  . If you are admitted to the hospital after surgery: o Your blood sugar will be checked by the staff and you will probably be given insulin after surgery (instead of oral diabetes medicines) to make sure you have good blood sugar levels. o The goal for blood sugar control after surgery is 80-180 mg/dL.    The Morning of Surgery  Do not wear jewelry, make-up or nail polish.  Do not wear lotions, powders, or perfumes, or deodorant  Do not shave 48 hours prior to surgery.    Do not bring valuables to the hospital.  [facility] is not responsible for any belongings or valuables.  If you are a smoker, DO NOT Smoke 24 hours prior to surgery IF you wear a CPAP at night please bring your mask, tubing, and machine the morning of surgery   Remember that you must have someone to transport you home after your surgery, and remain with you for 24 hours if you are discharged the same day.   Contacts, glasses, hearing aids, dentures or bridgework may not be worn into surgery.    Leave your suitcase in the car.  After surgery it may be brought to your room.  For patients admitted to the hospital, discharge time will be determined by your treatment team.  Patients discharged the day of surgery will not be allowed to drive home.    Special instructions:   [facility]- Preparing For Surgery  Before surgery, you can play an important role.  Because skin is not sterile, your skin needs to be as free of germs as possible. You can reduce the number of germs on your skin by washing with CHG (chlorahexidine gluconate) Soap before surgery.  CHG is an antiseptic cleaner which kills germs and bonds with the skin to continue killing germs even after washing.    Oral Hygiene is also important to reduce your risk of infection.  Remember - BRUSH YOUR TEETH THE MORNING OF SURGERY WITH YOUR REGULAR TOOTHPASTE  Please do not use if you have an allergy to CHG or antibacterial soaps. If your skin becomes reddened/irritated stop using the CHG.  Do not shave (including legs and underarms) for at least 48 hours prior to first CHG shower. It is  OK to shave your face.  Please follow these instructions carefully.   1. Shower the NIGHT BEFORE SURGERY and the MORNING OF SURGERY with CHG Soap.   2. If you chose to wash your hair, wash your hair first as usual with your normal shampoo.  3. After you shampoo, rinse your hair and body thoroughly to remove the shampoo.  4. Use CHG as you would any other liquid soap. You can apply CHG directly to the skin and wash gently with a scrungie or a clean washcloth.   5. Apply the CHG Soap to your body ONLY FROM THE NECK DOWN.  Do not use on open wounds or open sores. Avoid contact with your eyes, ears, mouth and genitals (private parts). Wash Face and genitals (private parts)  with your normal soap.   6. Wash thoroughly, paying special attention to the area where your surgery will be performed.  7. Thoroughly rinse your body with warm water from the neck down.  8. DO NOT shower/wash with your normal soap after using and rinsing off the CHG Soap.  9. Pat yourself dry with a CLEAN TOWEL.  10. Wear CLEAN PAJAMAS to bed the night before surgery, wear comfortable clothes the morning of surgery  11. Place CLEAN SHEETS on your bed the night of your first shower and DO NOT SLEEP WITH PETS.    Day of Surgery:  Do  not apply any deodorants/lotions. Please shower the morning of surgery with the CHG soap  Please wear clean clothes to the hospital/surgery center.   Remember to brush your teeth WITH YOUR REGULAR TOOTHPASTE.   Please read over the following fact sheets that you were given.

## 2019-01-17 ENCOUNTER — Other Ambulatory Visit: Payer: Self-pay

## 2019-01-17 ENCOUNTER — Encounter (HOSPITAL_COMMUNITY): Payer: Self-pay

## 2019-01-17 ENCOUNTER — Encounter (HOSPITAL_COMMUNITY)
Admission: RE | Admit: 2019-01-17 | Discharge: 2019-01-17 | Disposition: A | Discharge status: Home / Self Care | Payer: Medicare HMO | Source: Ambulatory Visit | Attending: Orthopedic Surgery | Admitting: Orthopedic Surgery

## 2019-01-17 DIAGNOSIS — Z01812 Encounter for preprocedural laboratory examination: Secondary | ICD-10-CM | POA: Diagnosis not present

## 2019-01-17 HISTORY — DX: Interstitial pulmonary disease, unspecified: J84.9

## 2019-01-17 HISTORY — DX: Other cardiomyopathies: I42.8

## 2019-01-17 LAB — CBC
HCT: 35 % — ABNORMAL LOW (ref 36.0–46.0)
Hemoglobin: 10.6 g/dL — ABNORMAL LOW (ref 12.0–15.0)
MCH: 31 pg (ref 26.0–34.0)
MCHC: 30.3 g/dL (ref 30.0–36.0)
MCV: 102.3 fL — ABNORMAL HIGH (ref 80.0–100.0)
Platelets: 150 10*3/uL (ref 150–400)
RBC: 3.42 MIL/uL — ABNORMAL LOW (ref 3.87–5.11)
RDW: 16.3 % — ABNORMAL HIGH (ref 11.5–15.5)
WBC: 4.4 10*3/uL (ref 4.0–10.5)
nRBC: 0 % (ref 0.0–0.2)

## 2019-01-17 LAB — BASIC METABOLIC PANEL
Anion gap: 9 (ref 5–15)
BUN: 32 mg/dL — ABNORMAL HIGH (ref 8–23)
CO2: 25 mmol/L (ref 22–32)
Calcium: 9.6 mg/dL (ref 8.9–10.3)
Chloride: 106 mmol/L (ref 98–111)
Creatinine, Ser: 2.44 mg/dL — ABNORMAL HIGH (ref 0.44–1.00)
GFR calc Af Amer: 23 mL/min — ABNORMAL LOW (ref >60)
GFR calc non Af Amer: 20 mL/min — ABNORMAL LOW (ref >60)
Glucose, Bld: 113 mg/dL — ABNORMAL HIGH (ref 70–99)
Potassium: 4.2 mmol/L (ref 3.5–5.1)
Sodium: 140 mmol/L (ref 135–145)

## 2019-01-17 LAB — HEMOGLOBIN A1C
Hgb A1c MFr Bld: 5.9 % — ABNORMAL HIGH (ref 4.8–5.6)
Mean Plasma Glucose: 122.63 mg/dL

## 2019-01-17 LAB — GLUCOSE, CAPILLARY: Glucose-Capillary: 110 mg/dL — ABNORMAL HIGH (ref 70–99)

## 2019-01-17 NOTE — Progress Notes (Addendum)
PCP - Dr. [name]  Cardiologist - Dr. Sallyanne Kuster  Chest x-ray - 03/15/18 (E)   EKG - 03/15/18 (E)  Stress Test - 03/02/18 (E)  ECHO - 02/16/18 (E)  Cardiac Cath - 11/02/15 (E)  AICD- Medtronic- faxed PM-na LOOP-na  Sleep Study - Yes- Positive CPAP - Yes- pt will bring mask dos  LABS- 01/17/2019: CBC, BMP, A1C 01/22/2019: COVID 01/24/2019: PT  ASA- Denies Eliquis- LD- 9/7  ERAS- Yes- G2 given  HA1C- 01/17/2019 Fasting Blood Sugar - 78-120, today 110 Checks Blood Sugar __1___ times a day  Anesthesia- Yes- cardiac history  Pt denies having chest pain or fever at this time Pt sts she gets sob often due to her chf with exertion. All instructions explained to the pt, with a verbal understanding of the material. Pt agrees to go over the instructions while at home for a better understanding. Pt also instructed to self quarantine after being tested for COVID-19. The opportunity to ask questions was provided.   Coronavirus Screening  Have you experienced the following symptoms:  Cough yes/no: No Fever (>100.2F)  yes/no: No Runny nose yes/no: No Sore throat yes/no: No Difficulty breathing/shortness of breath  yes/no: No  Have you or a family member traveled in the last 14 days and where? yes/no: No   If the patient indicates "YES" to the above questions, their PAT will be rescheduled to limit the exposure to others and, the surgeon will be notified. THE PATIENT WILL NEED TO BE ASYMPTOMATIC FOR 14 DAYS.   If the patient is not experiencing any of these symptoms, the PAT nurse will instruct them to NOT bring anyone with them to their appointment since they may have these symptoms or traveled as well.   Please remind your patients and families that hospital visitation restrictions are in effect and the importance of the restrictions.

## 2019-01-18 ENCOUNTER — Encounter (HOSPITAL_COMMUNITY): Payer: Self-pay

## 2019-01-18 NOTE — Anesthesia Preprocedure Evaluation (Addendum)
Anesthesia Evaluation  Patient identified by MRN, date of birth, ID band Patient awake    Reviewed: Allergy & Precautions, NPO status , Patient's Chart, lab work & pertinent test results  Airway Mallampati: III  TM Distance: >3 FB Neck ROM: Full    Dental  (+) Teeth Intact   Pulmonary sleep apnea and Continuous Positive Airway Pressure Ventilation , PE ILD Sarcoidosis Hx/o PE 2011   Pulmonary exam normal breath sounds clear to auscultation       Cardiovascular hypertension, Pt. on medications and Pt. on home beta blockers + angina +CHF and + DVT  Normal cardiovascular exam+ Cardiac Defibrillator  Rhythm:Regular Rate:Normal  Echo 02/16/2018 - Left ventricle: The cavity size was normal. Systolic function was severely reduced. The estimated ejection fraction was in the range of 20% to 25%. Severe diffuse hypokinesis with regional variations. The study is not technically sufficient to allow evaluation of LV diastolic function. - Mitral valve: There was trivial regurgitation. - Right ventricle: Systolic function was moderately reduced. - Tricuspid valve: There was mild regurgitation. - Pulmonary arteries: PA peak pressure: 67 mm Hg (S).  EKG 03/15/2018 NSR, 1st degree AV block, LVH  Myocardial Perfusion scan 03/02/2018  The left ventricular ejection fraction is severely decreased (<30%).  Nuclear stress EF: 22%.  This is a high risk study.  Findings consistent with prior myocardial infarction with peri-infarct ischemia.  Defect 1: There is a large defect of moderate severity present in the basal inferior, basal inferolateral, mid inferior, mid inferolateral and apical inferior location  Cardiac Cath 10/2015 1. No angiographic evidence of CAD 2. Elevated filling pressures   Neuro/Psych negative neurological ROS  negative psych ROS   GI/Hepatic negative GI ROS, Neg liver ROS,   Endo/Other  diabetes, Well Controlled, Type  2Obesity HLD Hx/o left breast Ca S/P left mastectomy  Renal/GU CRFRenal disease  negative genitourinary   Musculoskeletal Torn rotator cuff left shoulder Impingement syndrome left shoulderr   Abdominal (+) + obese,   Peds  Hematology  (+) anemia , Eliquis therapy- last dose 01/21/2019   Anesthesia Other Findings   Reproductive/Obstetrics                         Anesthesia Physical Anesthesia Plan  ASA: III  Anesthesia Plan: General   Post-op Pain Management:  Regional for Post-op pain   Induction: Intravenous  PONV Risk Score and Plan: Ondansetron, Dexamethasone and Treatment may vary due to age or medical condition  Airway Management Planned: Oral ETT  Additional Equipment: Arterial line  Intra-op Plan:   Post-operative Plan: Extubation in OR  Informed Consent: I have reviewed the patients History and Physical, chart, labs and discussed the procedure including the risks, benefits and alternatives for the proposed anesthesia with the patient or authorized representative who has indicated his/her understanding and acceptance.     Dental advisory given  Plan Discussed with: CRNA and Surgeon  Anesthesia Plan Comments: (PAT note written 01/18/2019 by Myra Gianotti, PA-C. Medtronic ICD Needs AICD reprogrammed prior to orthopedic procedure Etomidate for induction   )      Anesthesia Quick Evaluation

## 2019-01-18 NOTE — Progress Notes (Signed)
Anesthesia Chart Review:  Case: [id number] Date/Time: 01/24/19 0715   Procedure: SHOULDER ARTHROSCOPY WITH ROTATOR CUFF REPAIR AND SUBACROMIAL DECOMPRESSION (Left ) - 2.5 hrs   Anesthesia type: Choice   Pre-op diagnosis: Left shoulder rotator cuff tear with impingement   Location: [facility] OR ROOM 05 / [facility]   Surgeon: Nicholes Stairs, MD      DISCUSSION: Patient is a 68 year old female scheduled for the above procedure.  History includes never smoker, OSA (CPAP), Factor V Leiden (heterozygote), PE (09/24/08; chronic anticoagulation for reccurrent thromboembolic event), anemia, HLD, CKD stage III, OSA (CPAP), DM2, chronic combined systolic and diastolic CHF, non-ischemic cardiomyopathy (suspected Adriamycin-related), ICD (single chamber Medtronic [id number] Visia AF MRI VR ICD 03/14/18), breast cancer (1994, recurrence 1995; s/p right breast lumpectomy-->mastectomy, chemotherapy), ILD (CT findings suggestive of sarcoidosis).  BMI is consistent with obesity.    - Preoperative cardiology input as outlined by Fabian Sharp, [place] on 12/25/18, "Per Dr. Sallyanne Kuster:  She is at low risk for CV complications with rotator cuff surgery. For left shoulder surgery, surgeon's office will need to coordinate rep for her device.   Per our clinical pharmacist regarding anticoagulation: Pt takes Eliquis for DVT and PE, being prescribed by Dr Marin Olp - would recommend forwarding clearance to managing provider." Per Dr. Sallyanne Kuster, "If it's the left shoulder (same side as the ICD), we need to reprogram the device before the procedure." Optivol Thoracic impedance reading normal on 12/24/18.  - Dr. Marin Olp advised holding Eliquis for 2 days prior to surgery and resuming the day after. Per patient. last Eliquis will be 01/21/19.  Preoperative labs show stable H/H 10.6/35.0 and stable Cr 2.44. She is followed by Dr. Moshe Cipro. A1c 5.9%. She is scheduled for presurgical COVID-19 test on 01/22/19.  If results negative and otherwise no  acute changes then I would anticipate that she could proceed as planned. For PT/INR on the day of surgery.   VS: BP 114/68   Pulse 76   Temp (!) 36.4 C   Resp 20   Ht 5' 1"  (1.549 m)   Wt 93.7 kg   SpO2 99%   BMI 39.04 kg/m     PROVIDERS: Antony Contras, MD is PCP - Corliss Parish, MD is nephrologist. 08/16/18 office note is scanned under Media tab. By notes, patient's Cr has been ~ 2.1-2.5 since 2015.  - Croitoru, Mihai, MD is cardiologist - Burney Gauze, MD is HEM-ONC. Last visit 01/09/19. Kara Mead, MD is pulmonologist. Last visit 11/12/18 with Lazaro Arms, NP. 10/15/18 chest CT felt consistent with sarcoidosis.   LABS: Labs reviewed: Acceptable for surgery. Results consistent with prior. (all labs ordered are listed, but only abnormal results are displayed)  Labs Reviewed  GLUCOSE, CAPILLARY - Abnormal; Notable for the following components:      Result Value   Glucose-Capillary 110 (*)    All other components within normal limits  HEMOGLOBIN A1C - Abnormal; Notable for the following components:   Hgb A1c MFr Bld 5.9 (*)    All other components within normal limits  BASIC METABOLIC PANEL - Abnormal; Notable for the following components:   Glucose, Bld 113 (*)    BUN 32 (*)    Creatinine, Ser 2.44 (*)    GFR calc non Af Amer 20 (*)    GFR calc Af Amer 23 (*)    All other components within normal limits  CBC - Abnormal; Notable for the following components:   RBC 3.42 (*)    Hemoglobin 10.6 (*)  HCT 35.0 (*)    MCV 102.3 (*)    RDW 16.3 (*)    All other components within normal limits    PFTs 02/23/18: FVC 1.59 (72%), FEV1 1.40 (82%), DLCO unc 8.48 (39%), cor 9.76 (45%). Per Dr. Elsworth Soho, "no evidence of airway obstruction with ratio of 88, FEV1 of 82%.  There was moderate restriction with FVC of 72% and DLCO 64%.  This appears to be intraparenchymal with DLCO 45% that corrected to 76% for alveolar volume. Surprisingly she does not report much dyspnea or  wheezing."   IMAGES: CT left shoulder 12/05/18: IMPRESSION: 1.5 cm from front to back partial width, full-thickness tear of the anterior and far lateral supraspinatus with 1-2 cm of retraction. No atrophy. Moderate acromioclavicular osteoarthritis.  CT chest 10/15/18: IMPRESSION: 1. No significant interval change in irregular nodular ground-glass and interstitial pulmonary opacity seen in a slightly apically predominant distribution. Although not significantly changed compared to immediate prior examination, there is a somewhat more coarse and chronic appearance in comparison to more remote CT dated 08/30/2015. There is mild lobular air trapping and diffuse bilateral bronchial wall thickening. No significant bronchiectasis or bronchiolectasis. Findings are generally consistent with chronic hypersensitivity pneumonitis, other inhalational inflammatory interstitial lung disease, or pulmonary sarcoidosis. Biopsy may be helpful to establish definitive diagnosis. 2.  Cardiomegaly.   EKG: 03/15/18: Sinus rhythm with 1st degree A-V block with Premature supraventricular complexes Minimal voltage criteria for LVH, may be normal variant Cannot rule out Anterior infarct , age undetermined Abnormal ECG Confirmed by Sherren Mocha [phone]) on 03/15/2018 11:59:52 AM   CV: Nuclear stress test 10/18//19:  The left ventricular ejection fraction is severely decreased (<30%).  Nuclear stress EF: 22%.  This is a high risk study.  Findings consistent with prior myocardial infarction with peri-infarct ischemia.  Defect 1: There is a large defect of moderate severity present in the basal inferior, basal inferolateral, mid inferior, mid inferolateral and apical inferior location. High risk stress nuclear study due to severely depressed systolic function, markedly reduced since the 2017 study (and corroborated by echo). There is a large area of reduced perfusion in the inferior and inferolateral  walls, without reversibility, which may represent diaphragmatic attenuation artifact. - Dr. Sallyanne Kuster reviewed. He suspected defect likely diaphragmatic attenuation artifact. She had normal coronaries in 2017. ICD recommended for nonischemic CM.   Echo 02/16/18: Study Conclusions - Left ventricle: The cavity size was normal. Systolic function was   severely reduced. The estimated ejection fraction was in the   range of 20% to 25%. Severe diffuse hypokinesis with regional   variations. The study is not technically sufficient to allow   evaluation of LV diastolic function. - Mitral valve: There was trivial regurgitation. - Right ventricle: Systolic function was moderately reduced. - Tricuspid valve: There was mild regurgitation. - Pulmonary arteries: PA peak pressure: 67 mm Hg (S). Impressions: - The right ventricular systolic pressure was increased consistent   with moderate pulmonary hypertension.   RHC/LHC 11/02/15: Conclusion 1. No angiographic evidence of CAD 2. Elevated filling pressures  Recommendations: Will not need further ischemic evaluation. She has elevated filling pressures following pre-cath hydration. Would continue Lasix. May need to adjust dose based on renal function.  - EF 45% by 08/31/15 echo   Past Medical History:  Diagnosis Date  . ANEMIA-NOS   . BREAST CANCER, HX OF 1994 & 1995   1994>>recurrence in 1995, R breast, s/p lumpectomy>>mastectomy  . Cancer ([place])   . Cardiomyopathy, nonischemic ([place])   . CHF (  congestive heart failure) (HCC)    chronic systolic and diastolic CHF  . CHRONIC KIDNEY DISEASE STAGE III (MODERATE)   . Clotting disorder ([place])    on eliquis for Factor 5 disorder  . Colon polyps   . DIABETES MELLITUS, TYPE II   . Diverticulosis   . HYPERLIPIDEMIA   . HYPERTENSION   . ILD (interstitial lung disease) ([place])    10/2018 CT findings suggestive of sarcoidosis; followed by Dr. Elsworth Soho  . OBSTRUCTIVE SLEEP APNEA    CPAP  . Personal history  of chemotherapy   . Proteinuria   . PULMONARY EMBOLISM 09/24/2008   anticoag thru 03/2010  . Sleep apnea    Wears CPAP nightly    Past Surgical History:  Procedure Laterality Date  . BREAST SURGERY    . CARDIAC CATHETERIZATION N/A 11/02/2015   Procedure: Right/Left Heart Cath and Coronary Angiography;  Surgeon: Burnell Blanks, MD;  Location: [place] [facility];  Service: Cardiovascular;  Laterality: N/A;  . DILATION AND CURETTAGE OF UTERUS    . ICD IMPLANT N/A 03/14/2018   Procedure: ICD IMPLANT;  Surgeon: Sanda Klein, MD;  Location: [place] [facility];  Service: Cardiovascular;  Laterality: N/A;  . insertion port a cath    . MASTECTOMY Right 1995   Right  . PORT-A-CATH REMOVAL      MEDICATIONS: . acetaminophen (TYLENOL) 500 MG tablet  . allopurinol (ZYLOPRIM) 100 MG tablet  . apixaban (ELIQUIS) 5 MG TABS tablet  . Blood Glucose Monitoring Suppl (TRUE METRIX AIR GLUCOSE METER) w/Device KIT  . carvedilol (COREG) 25 MG tablet  . Cholecalciferol (VITAMIN D-3) 5000 units TABS  . colchicine 0.6 MG tablet  . diphenhydrAMINE (BENADRYL) 25 MG tablet  . ferrous sulfate 325 (65 FE) MG tablet  . furosemide (LASIX) 40 MG tablet  . hydrALAZINE (APRESOLINE) 10 MG tablet  . Incontinence Supply Disposable (UNDERGARMENT EXTRA ABSORBENT) MISC  . Insulin Glargine (LANTUS SOLOSTAR) 100 UNIT/ML Solostar Pen  . insulin lispro (HUMALOG KWIKPEN) 100 UNIT/ML KiwkPen  . isosorbide mononitrate (IMDUR) 30 MG 24 hr tablet  . Lactobacillus (CULTURELLE DIGESTIVE WOMENS PO)  . Lancet Devices (TRUEDRAW LANCING DEVICE) MISC  . rosuvastatin (CRESTOR) 10 MG tablet  . TRUE METRIX BLOOD GLUCOSE TEST test strip  . TURMERIC PO  . vitamin B-12 (CYANOCOBALAMIN) 1000 MCG tablet  . vitamin E 400 UNIT capsule   No current facility-administered medications for this encounter.     Myra Gianotti, PA-C Surgical Short Stay/Anesthesiology [facility] Phone [phone] [facility] Phone [phone] 01/18/2019 12:06  PM

## 2019-01-22 ENCOUNTER — Other Ambulatory Visit (HOSPITAL_COMMUNITY)
Admission: RE | Admit: 2019-01-22 | Discharge: 2019-01-22 | Disposition: A | Discharge status: Home / Self Care | Payer: Medicare HMO | Source: Ambulatory Visit | Attending: Orthopedic Surgery | Admitting: Orthopedic Surgery

## 2019-01-22 DIAGNOSIS — Z01812 Encounter for preprocedural laboratory examination: Secondary | ICD-10-CM | POA: Diagnosis not present

## 2019-01-22 DIAGNOSIS — Z20828 Contact with and (suspected) exposure to other viral communicable diseases: Secondary | ICD-10-CM | POA: Diagnosis not present

## 2019-01-22 LAB — SARS CORONAVIRUS 2 (TAT 6-24 HRS): SARS Coronavirus 2: NEGATIVE

## 2019-01-23 ENCOUNTER — Encounter (HOSPITAL_COMMUNITY): Payer: Self-pay | Admitting: Anesthesiology

## 2019-01-24 ENCOUNTER — Encounter (HOSPITAL_COMMUNITY): Payer: Self-pay | Admitting: Anesthesiology

## 2019-01-24 ENCOUNTER — Encounter (HOSPITAL_COMMUNITY)
Admission: RE | Disposition: A | Discharge status: Home / Self Care | Payer: Self-pay | Source: Home / Self Care | Attending: Orthopedic Surgery

## 2019-01-24 ENCOUNTER — Other Ambulatory Visit: Payer: Self-pay

## 2019-01-24 ENCOUNTER — Ambulatory Visit (HOSPITAL_COMMUNITY): Payer: Medicare HMO | Admitting: Certified Registered Nurse Anesthetist

## 2019-01-24 ENCOUNTER — Ambulatory Visit (HOSPITAL_COMMUNITY): Payer: Medicare HMO | Admitting: Vascular Surgery

## 2019-01-24 ENCOUNTER — Ambulatory Visit (HOSPITAL_COMMUNITY)
Admission: RE | Admit: 2019-01-24 | Discharge: 2019-01-24 | Disposition: A | Discharge status: Home / Self Care | Payer: Medicare HMO | Attending: Orthopedic Surgery | Admitting: Orthopedic Surgery

## 2019-01-24 DIAGNOSIS — Z7901 Long term (current) use of anticoagulants: Secondary | ICD-10-CM | POA: Diagnosis not present

## 2019-01-24 DIAGNOSIS — N183 Chronic kidney disease, stage 3 (moderate): Secondary | ICD-10-CM | POA: Diagnosis not present

## 2019-01-24 DIAGNOSIS — M75102 Unspecified rotator cuff tear or rupture of left shoulder, not specified as traumatic: Secondary | ICD-10-CM | POA: Insufficient documentation

## 2019-01-24 DIAGNOSIS — Z86718 Personal history of other venous thrombosis and embolism: Secondary | ICD-10-CM | POA: Insufficient documentation

## 2019-01-24 DIAGNOSIS — M25812 Other specified joint disorders, left shoulder: Secondary | ICD-10-CM | POA: Insufficient documentation

## 2019-01-24 DIAGNOSIS — E1122 Type 2 diabetes mellitus with diabetic chronic kidney disease: Secondary | ICD-10-CM | POA: Insufficient documentation

## 2019-01-24 DIAGNOSIS — Z853 Personal history of malignant neoplasm of breast: Secondary | ICD-10-CM | POA: Diagnosis not present

## 2019-01-24 DIAGNOSIS — Z79899 Other long term (current) drug therapy: Secondary | ICD-10-CM | POA: Diagnosis not present

## 2019-01-24 DIAGNOSIS — M75122 Complete rotator cuff tear or rupture of left shoulder, not specified as traumatic: Secondary | ICD-10-CM | POA: Diagnosis not present

## 2019-01-24 DIAGNOSIS — G8918 Other acute postprocedural pain: Secondary | ICD-10-CM | POA: Diagnosis not present

## 2019-01-24 DIAGNOSIS — G4733 Obstructive sleep apnea (adult) (pediatric): Secondary | ICD-10-CM | POA: Diagnosis not present

## 2019-01-24 DIAGNOSIS — Z95810 Presence of automatic (implantable) cardiac defibrillator: Secondary | ICD-10-CM | POA: Insufficient documentation

## 2019-01-24 DIAGNOSIS — I5042 Chronic combined systolic (congestive) and diastolic (congestive) heart failure: Secondary | ICD-10-CM | POA: Diagnosis not present

## 2019-01-24 DIAGNOSIS — M24112 Other articular cartilage disorders, left shoulder: Secondary | ICD-10-CM | POA: Diagnosis not present

## 2019-01-24 DIAGNOSIS — M75101 Unspecified rotator cuff tear or rupture of right shoulder, not specified as traumatic: Secondary | ICD-10-CM | POA: Diagnosis not present

## 2019-01-24 DIAGNOSIS — Z86711 Personal history of pulmonary embolism: Secondary | ICD-10-CM | POA: Insufficient documentation

## 2019-01-24 DIAGNOSIS — S43432A Superior glenoid labrum lesion of left shoulder, initial encounter: Secondary | ICD-10-CM | POA: Diagnosis not present

## 2019-01-24 DIAGNOSIS — I130 Hypertensive heart and chronic kidney disease with heart failure and stage 1 through stage 4 chronic kidney disease, or unspecified chronic kidney disease: Secondary | ICD-10-CM | POA: Insufficient documentation

## 2019-01-24 DIAGNOSIS — M7522 Bicipital tendinitis, left shoulder: Secondary | ICD-10-CM | POA: Insufficient documentation

## 2019-01-24 DIAGNOSIS — Z794 Long term (current) use of insulin: Secondary | ICD-10-CM | POA: Insufficient documentation

## 2019-01-24 DIAGNOSIS — E785 Hyperlipidemia, unspecified: Secondary | ICD-10-CM | POA: Diagnosis not present

## 2019-01-24 DIAGNOSIS — M7552 Bursitis of left shoulder: Secondary | ICD-10-CM | POA: Diagnosis not present

## 2019-01-24 DIAGNOSIS — M7541 Impingement syndrome of right shoulder: Secondary | ICD-10-CM | POA: Diagnosis not present

## 2019-01-24 DIAGNOSIS — D869 Sarcoidosis, unspecified: Secondary | ICD-10-CM | POA: Diagnosis not present

## 2019-01-24 DIAGNOSIS — M7521 Bicipital tendinitis, right shoulder: Secondary | ICD-10-CM | POA: Diagnosis not present

## 2019-01-24 DIAGNOSIS — Z9221 Personal history of antineoplastic chemotherapy: Secondary | ICD-10-CM | POA: Diagnosis not present

## 2019-01-24 DIAGNOSIS — M7542 Impingement syndrome of left shoulder: Secondary | ICD-10-CM | POA: Diagnosis not present

## 2019-01-24 DIAGNOSIS — D649 Anemia, unspecified: Secondary | ICD-10-CM | POA: Insufficient documentation

## 2019-01-24 HISTORY — DX: Impingement syndrome of left shoulder: M75.42

## 2019-01-24 HISTORY — DX: Other specified joint disorders, left shoulder: M25.812

## 2019-01-24 HISTORY — PX: SHOULDER ARTHROSCOPY WITH ROTATOR CUFF REPAIR AND SUBACROMIAL DECOMPRESSION: SHX5686

## 2019-01-24 HISTORY — DX: Unspecified rotator cuff tear or rupture of unspecified shoulder, not specified as traumatic: M75.100

## 2019-01-24 LAB — PROTIME-INR
INR: 1.3 — ABNORMAL HIGH (ref 0.8–1.2)
Prothrombin Time: 16.3 seconds — ABNORMAL HIGH (ref 11.4–15.2)

## 2019-01-24 LAB — GLUCOSE, CAPILLARY
Glucose-Capillary: 99 mg/dL (ref 70–99)
Glucose-Capillary: 132 mg/dL — ABNORMAL HIGH (ref 70–99)

## 2019-01-24 SURGERY — SHOULDER ARTHROSCOPY WITH ROTATOR CUFF REPAIR AND SUBACROMIAL DECOMPRESSION
Anesthesia: General | Laterality: Left

## 2019-01-24 MED ORDER — PROPOFOL 10 MG/ML IV BOLUS
INTRAVENOUS | Status: AC
  Filled 2019-01-24: qty 20

## 2019-01-24 MED ORDER — MEPERIDINE HCL 25 MG/ML IJ SOLN: 6.2500 mg | INTRAMUSCULAR | Status: DC | PRN

## 2019-01-24 MED ORDER — ETOMIDATE 2 MG/ML IV SOLN
INTRAVENOUS | Status: DC | PRN
  Administered 2019-01-24: 20 mg via INTRAVENOUS

## 2019-01-24 MED ORDER — FENTANYL CITRATE (PF) 100 MCG/2ML IJ SOLN
INTRAMUSCULAR | Status: DC | PRN
  Administered 2019-01-24: 50 ug via INTRAVENOUS
  Administered 2019-01-24 (×2): 25 ug via INTRAVENOUS

## 2019-01-24 MED ORDER — SODIUM CHLORIDE 0.9 % IR SOLN
Status: DC | PRN
  Administered 2019-01-24: 3000 mL

## 2019-01-24 MED ORDER — CHLORHEXIDINE GLUCONATE 4 % EX LIQD: 60.0000 mL | Freq: Once | CUTANEOUS | Status: DC

## 2019-01-24 MED ORDER — MIDAZOLAM HCL 5 MG/5ML IJ SOLN
INTRAMUSCULAR | Status: DC | PRN
  Administered 2019-01-24 (×2): 1 mg via INTRAVENOUS

## 2019-01-24 MED ORDER — LIDOCAINE 2% (20 MG/ML) 5 ML SYRINGE
INTRAMUSCULAR | Status: DC | PRN
  Administered 2019-01-24: 20 mg via INTRAVENOUS

## 2019-01-24 MED ORDER — CEFAZOLIN SODIUM-DEXTROSE 2-4 GM/100ML-% IV SOLN
2.0000 g | INTRAVENOUS | Status: AC
  Administered 2019-01-24: 08:00:00 2 g via INTRAVENOUS
  Filled 2019-01-24: qty 100

## 2019-01-24 MED ORDER — FENTANYL CITRATE (PF) 100 MCG/2ML IJ SOLN: 25.0000 ug | INTRAMUSCULAR | Status: DC | PRN

## 2019-01-24 MED ORDER — MIDAZOLAM HCL 2 MG/2ML IJ SOLN
INTRAMUSCULAR | Status: AC
  Filled 2019-01-24: qty 2

## 2019-01-24 MED ORDER — ONDANSETRON HCL 4 MG/2ML IJ SOLN
INTRAMUSCULAR | Status: DC | PRN
  Administered 2019-01-24: 4 mg via INTRAVENOUS

## 2019-01-24 MED ORDER — SODIUM CHLORIDE (PF) 0.9 % IJ SOLN
INTRAMUSCULAR | Status: DC | PRN
  Administered 2019-01-24: 10 mL

## 2019-01-24 MED ORDER — DEXAMETHASONE SODIUM PHOSPHATE 10 MG/ML IJ SOLN
INTRAMUSCULAR | Status: DC | PRN
  Administered 2019-01-24: 5 mg via INTRAVENOUS

## 2019-01-24 MED ORDER — ROCURONIUM BROMIDE 10 MG/ML (PF) SYRINGE
PREFILLED_SYRINGE | INTRAVENOUS | Status: DC | PRN
  Administered 2019-01-24: 10 mg via INTRAVENOUS
  Administered 2019-01-24: 40 mg via INTRAVENOUS

## 2019-01-24 MED ORDER — BUPIVACAINE LIPOSOME 1.3 % IJ SUSP
INTRAMUSCULAR | Status: DC | PRN
  Administered 2019-01-24: 10 mL via PERINEURAL

## 2019-01-24 MED ORDER — BUPIVACAINE-EPINEPHRINE (PF) 0.5% -1:200000 IJ SOLN
INTRAMUSCULAR | Status: DC | PRN
  Administered 2019-01-24: 20 mL via PERINEURAL

## 2019-01-24 MED ORDER — METOCLOPRAMIDE HCL 5 MG/ML IJ SOLN: 10.0000 mg | Freq: Once | INTRAMUSCULAR | Status: DC | PRN

## 2019-01-24 MED ORDER — FENTANYL CITRATE (PF) 250 MCG/5ML IJ SOLN
INTRAMUSCULAR | Status: AC
  Filled 2019-01-24: qty 5

## 2019-01-24 MED ORDER — OXYCODONE HCL 5 MG PO TABS: 5.0000 mg | ORAL_TABLET | Freq: Four times a day (QID) | ORAL | 0 refills | Status: DC | PRN

## 2019-01-24 MED ORDER — SUGAMMADEX SODIUM 200 MG/2ML IV SOLN
INTRAVENOUS | Status: DC | PRN
  Administered 2019-01-24: 200 mg via INTRAVENOUS

## 2019-01-24 MED ORDER — SUCCINYLCHOLINE CHLORIDE 200 MG/10ML IV SOSY
PREFILLED_SYRINGE | INTRAVENOUS | Status: DC | PRN
  Administered 2019-01-24: 100 mg via INTRAVENOUS

## 2019-01-24 MED ORDER — BUPIVACAINE-EPINEPHRINE (PF) 0.25% -1:200000 IJ SOLN
INTRAMUSCULAR | Status: AC
  Filled 2019-01-24: qty 30

## 2019-01-24 MED ORDER — EPHEDRINE SULFATE-NACL 50-0.9 MG/10ML-% IV SOSY
PREFILLED_SYRINGE | INTRAVENOUS | Status: DC | PRN
  Administered 2019-01-24: 5 mg via INTRAVENOUS

## 2019-01-24 MED ORDER — LACTATED RINGERS IV SOLN
INTRAVENOUS | Status: DC | PRN
  Administered 2019-01-24: 07:00:00 via INTRAVENOUS

## 2019-01-24 MED ORDER — BUPIVACAINE-EPINEPHRINE (PF) 0.25% -1:200000 IJ SOLN
INTRAMUSCULAR | Status: DC | PRN
  Administered 2019-01-24: 30 mL via PERINEURAL

## 2019-01-24 SURGICAL SUPPLY — 47 items
ALCOHOL 70% 16 OZ (MISCELLANEOUS) ×3 IMPLANT
ANCH SUT SWLK 19.1X4.75 (Anchor) ×3 IMPLANT
ANCHOR SUT BIO SW 4.75X19.1 (Anchor) ×6 IMPLANT
BLADE GREAT WHITE 4.2 (BLADE) ×1 IMPLANT
BLADE GREAT WHITE 4.2MM (BLADE)
BLADE SURG 11 STRL SS (BLADE) ×3 IMPLANT
BUR OVAL 4.0 (BURR) ×3 IMPLANT
CANNULA 5.75X7 CRYSTAL CLEAR (CANNULA) ×3 IMPLANT
CANNULA TWIST IN 8.25X7CM (CANNULA) ×5 IMPLANT
COVER WAND RF STERILE (DRAPES) ×1 IMPLANT
DRAPE INCISE IOBAN 66X45 STRL (DRAPES) ×3 IMPLANT
DRAPE STERI 35X30 U-POUCH (DRAPES) ×3 IMPLANT
DRAPE U-SHAPE 47X51 STRL (DRAPES) ×3 IMPLANT
DRSG PAD ABDOMINAL 8X10 ST (GAUZE/BANDAGES/DRESSINGS) ×9 IMPLANT
DURAPREP 26ML APPLICATOR (WOUND CARE) ×2 IMPLANT
GAUZE SPONGE 4X4 12PLY STRL (GAUZE/BANDAGES/DRESSINGS) ×3 IMPLANT
GAUZE SPONGE 4X4 16PLY XRAY LF (GAUZE/BANDAGES/DRESSINGS) ×2 IMPLANT
GLOVE BIO SURGEON STRL SZ7.5 (GLOVE) ×3 IMPLANT
GLOVE BIOGEL PI IND STRL 8 (GLOVE) ×1 IMPLANT
GLOVE BIOGEL PI INDICATOR 8 (GLOVE) ×2
GOWN STRL REUS W/ TWL LRG LVL3 (GOWN DISPOSABLE) ×2 IMPLANT
GOWN STRL REUS W/TWL LRG LVL3 (GOWN DISPOSABLE) ×6
KIT BASIN OR (CUSTOM PROCEDURE TRAY) ×3 IMPLANT
KIT SHOULDER TRACTION (DRAPES) ×3 IMPLANT
KIT TURNOVER KIT B (KITS) ×3 IMPLANT
MANIFOLD NEPTUNE II (INSTRUMENTS) ×3 IMPLANT
NDL SCORPION MULTI FIRE (NEEDLE) ×1 IMPLANT
NDL SPNL 18GX3.5 QUINCKE PK (NEEDLE) ×1 IMPLANT
NEEDLE SCORPION MULTI FIRE (NEEDLE) ×6 IMPLANT
NEEDLE SPNL 18GX3.5 QUINCKE PK (NEEDLE) ×3 IMPLANT
NS IRRIG 1000ML POUR BTL (IV SOLUTION) ×3 IMPLANT
PACK SHOULDER (CUSTOM PROCEDURE TRAY) ×3 IMPLANT
PAD ABD 8X10 STRL (GAUZE/BANDAGES/DRESSINGS) ×2 IMPLANT
PAD ARMBOARD 7.5X6 YLW CONV (MISCELLANEOUS) ×6 IMPLANT
PORT APPOLLO RF 90DEGREE MULTI (SURGICAL WAND) ×2 IMPLANT
PROBE BIPOLAR ATHRO 135MM 90D (MISCELLANEOUS) ×3 IMPLANT
SLING S3 LATERAL DISP (MISCELLANEOUS) ×3 IMPLANT
SPONGE LAP 4X18 RFD (DISPOSABLE) ×3 IMPLANT
SUT ETHILON 3 0 PS 1 (SUTURE) ×3 IMPLANT
SUT TIGER TAPE 7 IN WHITE (SUTURE) IMPLANT
TAPE FIBER 2MM 7IN #2 BLUE (SUTURE) ×3 IMPLANT
TAPE PAPER 3X10 WHT MICROPORE (GAUZE/BANDAGES/DRESSINGS) ×3 IMPLANT
TOWEL GREEN STERILE (TOWEL DISPOSABLE) ×3 IMPLANT
TOWEL GREEN STERILE FF (TOWEL DISPOSABLE) ×3 IMPLANT
TUBING ARTHROSCOPY IRRIG 16FT (MISCELLANEOUS) ×3 IMPLANT
WAND STAR VAC 90 (SURGICAL WAND) ×3 IMPLANT
WATER STERILE IRR 1000ML POUR (IV SOLUTION) ×1 IMPLANT

## 2019-01-24 NOTE — Transfer of Care (Signed)
Immediate Anesthesia Transfer of Care Note  Patient: Patricia Morgan  Procedure(s) Performed: SHOULDER ARTHROSCOPY WITH ROTATOR CUFF REPAIR AND SUBACROMIAL DECOMPRESSION (Left )  Patient Location: PACU  Anesthesia Type:GA combined with regional for post-op pain  Level of Consciousness: awake, alert , oriented and patient cooperative  Airway & Oxygen Therapy: Patient Spontanous Breathing and Patient connected to face mask oxygen  Post-op Assessment: Report given to RN and Post -op Vital signs reviewed and stable  Post vital signs: Reviewed and stable  Last Vitals:  Vitals Value Taken Time  BP 106/73 01/24/19 0953  Temp    Pulse 63 01/24/19 0957  Resp 19 01/24/19 0957  SpO2 91 % 01/24/19 0957  Vitals shown include unvalidated device data.  Last Pain:  Vitals:   01/24/19 0645  TempSrc: Oral  PainSc: 0-No pain         Complications: No apparent anesthesia complications

## 2019-01-24 NOTE — Brief Op Note (Signed)
01/24/2019  9:32 AM  PATIENT:  Patricia Morgan  68 y.o. female  PRE-OPERATIVE DIAGNOSIS:  Right shoulder rotator cuff tear with impingement  POST-OPERATIVE DIAGNOSIS:  Right shoulder rotator cuff tear with impingement, degenerative labrum tearing, biceps tendinitis  PROCEDURE:  Procedure(s) with comments: SHOULDER ARTHROSCOPY WITH ROTATOR CUFF REPAIR AND SUBACROMIAL DECOMPRESSION, and extensive debridement (Left) - 2.5 hrs  SURGEON:  Surgeon(s) and Role:    * Stann Mainland, Elly Modena, MD - Primary  PHYSICIAN ASSISTANT: none  ASSISTANTS: none   ANESTHESIA:   regional and general  EBL:  10 cc  BLOOD ADMINISTERED:none  DRAINS: none   LOCAL MEDICATIONS USED:  MARCAINE     SPECIMEN:  No Specimen  DISPOSITION OF SPECIMEN:  N/A  COUNTS:  YES  TOURNIQUET:  * No tourniquets in log *  DICTATION: .Note written in EPIC  PLAN OF CARE: Discharge to home after PACU  PATIENT DISPOSITION:  PACU - hemodynamically stable.   Delay start of Pharmacological VTE agent (>24hrs) due to surgical blood loss or risk of bleeding: not applicable

## 2019-01-24 NOTE — Anesthesia Procedure Notes (Signed)
Anesthesia Regional Block: Interscalene brachial plexus block   Pre-Anesthetic Checklist: ,, timeout performed, Correct Patient, Correct Site, Correct Laterality, Correct Procedure, Correct Position, site marked, Risks and benefits discussed,  Surgical consent,  Pre-op evaluation,  At surgeon's request and post-op pain management  Laterality: Left  Prep: chloraprep       Needles:  Injection technique: Single-shot  Needle Type: Echogenic Stimulator Needle     Needle Length: 9cm  Needle Gauge: 21   Needle insertion depth: 6 cm   Additional Needles:   Procedures:,,,, ultrasound used (permanent image in chart),,,,  Narrative:  Start time: 01/24/2019 7:17 AM End time: 01/24/2019 7:22 AM Injection made incrementally with aspirations every 5 mL.  Performed by: Personally  Anesthesiologist: Josephine Igo, MD  Additional Notes: Timeout performed. Patient sedated. Relevant anatomy ID'd using [place]. Incremental 2-12ml injection of LA with frequent aspiration. Patient tolerated procedure well. .       Left Interscalene Block

## 2019-01-24 NOTE — Progress Notes (Signed)
Medtronic rep paged, per Dr. Victorino December request.

## 2019-01-24 NOTE — Anesthesia Postprocedure Evaluation (Signed)
Anesthesia Post Note  Patient: Patricia Morgan  Procedure(s) Performed: SHOULDER ARTHROSCOPY WITH ROTATOR CUFF REPAIR AND SUBACROMIAL DECOMPRESSION (Left )     Patient location during evaluation: PACU Anesthesia Type: General Level of consciousness: awake and alert and oriented Pain management: pain level controlled Vital Signs Assessment: post-procedure vital signs reviewed and stable Respiratory status: spontaneous breathing, nonlabored ventilation, respiratory function stable and patient connected to nasal cannula oxygen Cardiovascular status: blood pressure returned to baseline and stable Postop Assessment: no apparent nausea or vomiting Anesthetic complications: no    Last Vitals:  Vitals:   01/24/19 0645 01/24/19 0953  BP: 127/69 106/73  Pulse: 71 64  Resp: 16 14  Temp: 36.9 C (!) 35.6 C  SpO2: 100% 95%    Last Pain:  Vitals:   01/24/19 1114  TempSrc:   PainSc: Asleep                 [name] A.

## 2019-01-24 NOTE — H&P (Signed)
ORTHOPAEDIC H and P  REQUESTING PHYSICIAN: Nicholes Stairs, MD  PCP:  Antony Contras, MD  Chief Complaint: Left shoulder pain  HPI: Patricia Morgan is a 68 y.o. female who complains of chronic right shoulder pain and weakness.  She has had failure to respond to exhaustive conservative management.  She does have chronic history of internal cardiac defibrillator with sarcoidosis and long-term anticoagulation.  She has had CT arthrogram demonstrating a rotator cuff tear that explains her left shoulder pain and weakness.  She is here today for operative intervention after being optimized and cleared by her cardiologist.  No new complaints.  Past Medical History:  Diagnosis Date  . ANEMIA-NOS   . BREAST CANCER, HX OF 1994 & 1995   1994>>recurrence in 1995, R breast, s/p lumpectomy>>mastectomy  . Cancer ([place])   . Cardiomyopathy, nonischemic ([place])   . CHF (congestive heart failure) (HCC)    chronic systolic and diastolic CHF  . CHRONIC KIDNEY DISEASE STAGE III (MODERATE)   . Clotting disorder ([place])    on eliquis for Factor 5 disorder  . Colon polyps   . DIABETES MELLITUS, TYPE II   . Diverticulosis   . HYPERLIPIDEMIA   . HYPERTENSION   . ILD (interstitial lung disease) ([place])    10/2018 CT findings suggestive of sarcoidosis; followed by Dr. Elsworth Soho  . OBSTRUCTIVE SLEEP APNEA    CPAP  . Personal history of chemotherapy   . Proteinuria   . PULMONARY EMBOLISM 09/24/2008   anticoag thru 03/2010  . Rotator cuff tear    Left  . Shoulder impingement, left   . Sleep apnea    Wears CPAP nightly   Past Surgical History:  Procedure Laterality Date  . BREAST SURGERY    . CARDIAC CATHETERIZATION N/A 11/02/2015   Procedure: Right/Left Heart Cath and Coronary Angiography;  Surgeon: Burnell Blanks, MD;  Location: [place] [facility];  Service: Cardiovascular;  Laterality: N/A;  . DILATION AND CURETTAGE OF UTERUS    . ICD IMPLANT N/A 03/14/2018   Procedure: ICD IMPLANT;  Surgeon:  Sanda Klein, MD;  Location: [place] [facility];  Service: Cardiovascular;  Laterality: N/A;  . insertion port a cath    . MASTECTOMY Right 1995   Right  . PORT-A-CATH REMOVAL     Social History   Socioeconomic History  . Marital status: Divorced    Spouse name: Not on file  . Number of children: 2  . Years of education: 95  . Highest education level: Not on file  Occupational History  . Occupation: retired  Scientific laboratory technician  . Financial resource strain: Not on file  . Food insecurity    Worry: Not on file    Inability: Not on file  . Transportation needs    Medical: Not on file    Non-medical: Not on file  Tobacco Use  . Smoking status: Never Smoker  . Smokeless tobacco: Never Used  . Tobacco comment: Lives alone-divorced  Substance and Sexual Activity  . Alcohol use: Yes    Alcohol/week: 0.0 standard drinks    Comment: occ  . Drug use: No  . Sexual activity: Not on file  Lifestyle  . Physical activity    Days per week: Not on file    Minutes per session: Not on file  . Stress: Not on file  Relationships  . Social Herbalist on phone: Not on file    Gets together: Not on file    Attends religious  service: Not on file    Active member of club or organization: Not on file    Attends meetings of clubs or organizations: Not on file    Relationship status: Not on file  Other Topics Concern  . Not on file  Social History Narrative  . Not on file   Family History  Problem Relation Age of Onset  . Parkinson's disease Mother   . Asthma Father   . Diabetes Sister   . Colon cancer Neg Hx   . Stomach cancer Neg Hx   . Esophageal cancer Neg Hx   . Rectal cancer Neg Hx   . Liver cancer Neg Hx    No Known Allergies Prior to Admission medications   Medication Sig Start Date End Date Taking? Authorizing Provider  acetaminophen (TYLENOL) 500 MG tablet Take 500 mg by mouth every 6 (six) hours as needed for mild pain.   Yes [provider]   allopurinol (ZYLOPRIM) 100 MG tablet Take 100 mg by mouth 2 (two) times daily.    Yes [provider]  apixaban (ELIQUIS) 5 MG TABS tablet Take 1 tablet (5 mg total) by mouth 2 (two) times daily. 04/19/18  Yes Ennever, Rudell Cobb, MD  Blood Glucose Monitoring Suppl (TRUE METRIX AIR GLUCOSE METER) w/Device KIT  08/22/18  Yes [provider]  carvedilol (COREG) 25 MG tablet TAKE 1 TABLET TWICE DAILY WITH MEALS Patient taking differently: Take 25 mg by mouth 2 (two) times daily with a meal.  12/31/18  Yes Croitoru, Mihai, MD  Cholecalciferol (VITAMIN D-3) 5000 units TABS Take 5,000 Units by mouth daily.    Yes [provider]  colchicine 0.6 MG tablet Take 0.6 mg by mouth daily.  07/13/14  Yes [provider]  diphenhydrAMINE (BENADRYL) 25 MG tablet Take 25 mg by mouth daily.   Yes [provider]  ferrous sulfate 325 (65 FE) MG tablet Take 325 mg by mouth daily with breakfast.   Yes [provider]  furosemide (LASIX) 40 MG tablet Take 2 tablets (80 mg total) by mouth 2 (two) times daily. Take 80 mg in the morning and 40 mg in the evening Patient taking differently: Take 40 mg by mouth 2 (two) times daily as needed for fluid or edema.  02/26/18  Yes Lendon Colonel, NP  hydrALAZINE (APRESOLINE) 10 MG tablet TAKE 1 TABLET (10 MG) BY MOUTH THREE TIMES DAILY Patient taking differently: Take 10 mg by mouth 3 (three) times daily.  09/11/18  Yes Croitoru, Mihai, MD  Incontinence Supply Disposable (UNDERGARMENT EXTRA ABSORBENT) MISC  03/24/18  Yes [provider]  isosorbide mononitrate (IMDUR) 30 MG 24 hr tablet TAKE 1 TABLET (30 MG) DAILY Patient taking differently: Take 30 mg by mouth daily.  08/13/18  Yes Croitoru, Mihai, MD  Lactobacillus (CULTURELLE DIGESTIVE WOMENS PO) Take 1 capsule by mouth daily.   Yes [provider]  Lancet Devices (TRUEDRAW LANCING DEVICE) [place] TRUEdraw Lancing Device   Yes [provider]  rosuvastatin  (CRESTOR) 10 MG tablet TAKE 1 TABLET EVERY DAY Patient taking differently: Take 10 mg by mouth daily.  10/02/18  Yes Croitoru, Mihai, MD  TRUE METRIX BLOOD GLUCOSE TEST test strip  02/29/16  Yes [provider]  TURMERIC PO Take 1,000 mg by mouth daily.   Yes [provider]  vitamin B-12 (CYANOCOBALAMIN) 1000 MCG tablet Take 1,000 mcg by mouth daily.   Yes [provider]  vitamin E 400 UNIT capsule Take 400 Units by  mouth daily.   Yes [provider]  Insulin Glargine (LANTUS SOLOSTAR) 100 UNIT/ML Solostar Pen Inject 30 Units into the skin 2 (two) times daily as needed (for blood sugar greater than 200.).     [provider]  insulin lispro (HUMALOG KWIKPEN) 100 UNIT/ML KiwkPen Inject 10 Units into the skin 3 (three) times daily as needed (for blood sugar greater than 200.).     [provider]  fluticasone (FLONASE) 50 MCG/ACT nasal spray Place 2 sprays into the nose daily. 12/16/10 08/03/11  Rowe Clack, MD   No results found.  Positive ROS: All other systems have been reviewed and were otherwise negative with the exception of those mentioned in the HPI and as above.  Physical Exam: General: Alert, no acute distress Cardiovascular: No pedal edema Respiratory: No cyanosis, no use of accessory musculature GI: No organomegaly, abdomen is soft and non-tender Skin: No lesions in the area of chief complaint Neurologic: Sensation intact distally Psychiatric: Patient is competent for consent with normal mood and affect Lymphatic: No axillary or cervical lymphadenopathy  MUSCULOSKELETAL:  Left upper extremity:  No open wounds.  Skin is warm and well-perfused.  Neurovascularly intact.  Assessment: Left rotator cuff tear Left shoulder subacromial impingement  Plan: -Plan for arthroscopic surgery on the left shoulder today to include subacromial decompression with debridement and rotator cuff repair.  We again reviewed the risk,  benefits, and indications of this procedure at length.  She has provided informed consent to proceed.  -Her postoperative plan will be for discharge home assuming she tolerates the anesthesia well.    Nicholes Stairs, MD Cell [phone]    01/24/2019 7:28 AM

## 2019-01-24 NOTE — Discharge Instructions (Signed)
Maintain sling to left arm at all times unless participating in physical therapy or to remove throughout the day to perform, elbow, hand, wrist range of motion as tolerated. - no active motion through the left shoulder  - apply ice to the left shoulder for 30 minutes per hour that you are awake, around the clock  - maintain post op dressings for 3 days.  You may remove the dressing at that point and begin showering, but do not submerge the left shoulder under water until your follow up appointment  - you may resume your eliquis starting tomorrow.  - return to see Dr. Stann Mainland in 2 weeks for routine post operative care

## 2019-01-24 NOTE — Op Note (Signed)
01/24/2019   PATIENT:  Patricia Morgan    PRE-OPERATIVE DIAGNOSIS:  1. Left shoulder Rotator cuff tear, supraspinatus 2. Left shoulder subacromial impingement  POST-OPERATIVE DIAGNOSIS:   1. Left shoulder Rotator cuff tear, supraspinatus 2. Left shoulder subacromial impingement 3. Left shoulder biceps tendinitis 4. Left shoulder degenerative labral tearing, anterior, superior, and posterior  PROCEDURE:   1. Left SHOULDER ARTHROSCOPY WITH ROTATOR CUFF REPAIR  2. Left shoulder arthroscopic subacromial decompression without CA ligament release 3. Left shoulder extensive debridement of rotator interval, anterior labrum, superior labrum, and posterior labrum 4. Left shoulder arthroscopic biceps tenotomy   SURGEON:  Nicholes Stairs, MD  PHYSICIAN ASSISTANT: None  ANESTHESIA:   General  ESTIMATED BLOOD LOSS: 10 cc  PREOPERATIVE INDICATIONS:  Patricia Morgan is a  68 y.o. female with a diagnosis of Right shoulder rotator cuff tear with impingement who failed conservative measures and elected for surgical management.    The risks benefits and alternatives were discussed with the patient preoperatively including but not limited to the risks of infection, bleeding, nerve injury, cardiopulmonary complications, the need for revision surgery, among others, and the patient was willing to proceed.  OPERATIVE IMPLANTS: Arthrex 4.75 mm swivel lock anchors 2.  Arthrex fiber tape 2  OPERATIVE FINDINGS:  On the intra-articular portion of the arthroscopy she was noted to have abundant inflammation and proximal biceps tendinitis with flap tears of the anterior superior and posterior labrum.  She had thickening of the anterior capsule.  Intact rotator cuff muscle of the subscapularis, teres minor, and infraspinatus.  Anterior tearing noted of the supraspinatus.  No chondromalacia of the humeral head or glenoid fossa.  No loose bodies.  In the subacromial space she had abundant bursitis with  scuffing of the CA ligament as well as type II acromion.  She had a crescent-shaped tear of the supraspinatus measuring 2 cm from anterior to posterior and 1.5 cm from medial to lateral.  This tissue quality was fair.  UNIQUE ASPECTS OF THE CASE:   Bone quality was quite poor, likely related to her long-standing history of sarcoidosis.  This resulted and medial row anchor pulling out.  We then converted to a single row repair utilizing a speed fix technique with lateral row anchors only.  OPERATIVE PROCEDURE: The patient was brought to the operating room and placed in the supine position. General anesthesia was administered. IV antibiotics were given. General anesthesia was administered.    The upper extremity was prepped and draped in the usual sterile fashion. The patient was in a semilateral decubitus position.  Time out was performed. Diagnostic arthroscopy was carried out the above-named findings.   We began the procedure with the intra-articular portions.  This was a, spine establishing a posterior viewing portal 2 cm distant and 1 cm medial to the posterior lateral border of the acromion.  Next we established a anterior working portal in the mid glenoid space utilizing direct spinal needle localization.  While viewing posteriorly and working anteriorly reperformed an extensive debridement with motorized shaver and radiofrequency wand.  The shaver was utilized to debride any loose flaps of labrum tissue along the anterior, superior, and posterior labrum.  We did also identify abundant synovitis of the biceps tendon and thus this was tenotomized with the radio frequency wand.  Furthermore we performed a wide debridement of the rotator interval given its thickened capsule.  This debridement we carried anterior and inferiorly along the deep surface of the sub-scapularis tendon.  Next we moved to the  subacromial space.  With the camera in the subacromial space while viewing from the posterior portal  established a lateral working portal.  This was done via spinal needle localization.  Rocs only 4 cm lateral to the edge of the acromium and in line with the posterior aspect of the clavicle this was accomplished.  Next we performed the rotator cuff repair.  We initially performed mobilizations identified the supraspinatus crescent tear.  This measured 2 cm from front to back and 1.5 cm medial to lateral.  This was quite mobile in nature.  We initially placed a medial row anchor that was double loaded with fiber tape sutures.  After passing the 2 anterior sutures the anchor dislodged from her very soft and poor bone quality.  At that juncture we elected to place 2 horizontal mattress stitches with the Fiber tape sutures.  These were then pulled into 2 Separate Lateral Row anchors.  The greater tuberosity was gently decorticated to allow good healing biology.  These anchors were then placed one anterior and one posterior with reasonable bone bite but again we encountered poor bone quality.  The rotator cuff was brought nicely to the lateral row.  Lastly, we performed the subacromial decompression.  Utilizing the radiofrequency wand soft tissue was dissected from the undersurface of the acromion.  This identified a type II anterior hook to the acromion.  We then used the motorized bur to flatten the anterior hook be a cutting block technique.  This.  A nice type I acromion.  The coracoacromial ligament was left intact.  It was gently reflected off of the anterior acromion to identify the undersurface spur.  Abundant, and wide subacromial bursectomy was also carried out with motorized shaver.  Pictures were taken throughout the procedure.  Arthroscopic intstruments and fluid was removed from the shoulder.  Incisions were closed with 3-0 nylon.  Standard sterile bandage and immobilization sling were applied.  The patient was awakened and returned to the PACU in stable and satisfactory condition. There were no  complications and the patient tolerated the procedure well.  All counts were correct.  Disposition:  The patient will be nonweightbearing with an abduction sling to the operative extremity.  SHe may begin scapular retractions and elbow hand and wrist range motion as tolerated.  She will begin physical therapy in 1 week, on the less than 3 cm tear protocol.  I will see them back in the office in 2 weeks for a wound check.

## 2019-01-24 NOTE — Anesthesia Procedure Notes (Signed)
Arterial Line Insertion Start/End9/02/2019 7:27 AM, 01/24/2019 7:37 AM Performed by: Cleda Daub, CRNA, CRNA  Patient location: Pre-op. Preanesthetic checklist: patient identified, IV checked, site marked, risks and benefits discussed, surgical consent, monitors and equipment checked, pre-op evaluation and anesthesia consent Right, radial was placed Catheter size: 20 G Hand hygiene performed , maximum sterile barriers used  and Seldinger technique used Allen's test indicative of satisfactory collateral circulation Attempts: 1 Procedure performed without using ultrasound guided technique. Ultrasound Notes:anatomy identified, needle tip was noted to be adjacent to the nerve/plexus identified and no ultrasound evidence of intravascular and/or intraneural injection Following insertion, Biopatch and dressing applied. Post procedure assessment: normal  Patient tolerated the procedure well with no immediate complications.

## 2019-01-24 NOTE — Progress Notes (Signed)
Orthopedic Tech Progress Note Patient Details:  Patricia Morgan 02/06/1951 [id number]  Ortho Devices Type of Ortho Device: Abduction pillow       [place] Pink 01/24/2019, 10:16 AM

## 2019-01-24 NOTE — Anesthesia Procedure Notes (Signed)
Procedure Name: Intubation Date/Time: 01/24/2019 8:03 AM Performed by: Cleda Daub, CRNA Pre-anesthesia Checklist: Patient identified, Emergency Drugs available, Suction available and Patient being monitored Patient Re-evaluated:Patient Re-evaluated prior to induction Oxygen Delivery Method: Circle system utilized Preoxygenation: Pre-oxygenation with 100% oxygen Induction Type: IV induction and Rapid sequence Laryngoscope Size: Glidescope and 4 Grade View: Grade I Tube size: 7.0 mm Number of attempts: 1 Airway Equipment and Method: Stylet and Video-laryngoscopy Placement Confirmation: positive ETCO2 and breath sounds checked- equal and bilateral Secured at: 21 cm Tube secured with: Tape Dental Injury: Teeth and Oropharynx as per pre-operative assessment

## 2019-01-25 ENCOUNTER — Encounter (HOSPITAL_COMMUNITY): Payer: Self-pay | Admitting: Orthopedic Surgery

## 2019-02-04 ENCOUNTER — Encounter: Payer: Medicare HMO | Admitting: Cardiovascular Disease

## 2019-02-08 DIAGNOSIS — I5020 Unspecified systolic (congestive) heart failure: Secondary | ICD-10-CM | POA: Diagnosis not present

## 2019-02-08 DIAGNOSIS — N184 Chronic kidney disease, stage 4 (severe): Secondary | ICD-10-CM | POA: Diagnosis not present

## 2019-02-08 DIAGNOSIS — C50911 Malignant neoplasm of unspecified site of right female breast: Secondary | ICD-10-CM | POA: Diagnosis not present

## 2019-02-08 DIAGNOSIS — I10 Essential (primary) hypertension: Secondary | ICD-10-CM | POA: Diagnosis not present

## 2019-02-08 DIAGNOSIS — N183 Chronic kidney disease, stage 3 (moderate): Secondary | ICD-10-CM | POA: Diagnosis not present

## 2019-02-08 DIAGNOSIS — I129 Hypertensive chronic kidney disease with stage 1 through stage 4 chronic kidney disease, or unspecified chronic kidney disease: Secondary | ICD-10-CM | POA: Diagnosis not present

## 2019-02-08 DIAGNOSIS — E7800 Pure hypercholesterolemia, unspecified: Secondary | ICD-10-CM | POA: Diagnosis not present

## 2019-02-08 DIAGNOSIS — E1122 Type 2 diabetes mellitus with diabetic chronic kidney disease: Secondary | ICD-10-CM | POA: Diagnosis not present

## 2019-02-08 DIAGNOSIS — Z853 Personal history of malignant neoplasm of breast: Secondary | ICD-10-CM | POA: Diagnosis not present

## 2019-02-11 DIAGNOSIS — G4733 Obstructive sleep apnea (adult) (pediatric): Secondary | ICD-10-CM | POA: Diagnosis not present

## 2019-02-18 DIAGNOSIS — E7800 Pure hypercholesterolemia, unspecified: Secondary | ICD-10-CM | POA: Diagnosis not present

## 2019-02-18 DIAGNOSIS — I10 Essential (primary) hypertension: Secondary | ICD-10-CM | POA: Diagnosis not present

## 2019-02-18 DIAGNOSIS — Z4789 Encounter for other orthopedic aftercare: Secondary | ICD-10-CM | POA: Diagnosis not present

## 2019-02-18 DIAGNOSIS — J989 Respiratory disorder, unspecified: Secondary | ICD-10-CM | POA: Diagnosis not present

## 2019-02-18 DIAGNOSIS — Z859 Personal history of malignant neoplasm, unspecified: Secondary | ICD-10-CM | POA: Diagnosis not present

## 2019-02-18 DIAGNOSIS — Z794 Long term (current) use of insulin: Secondary | ICD-10-CM | POA: Diagnosis not present

## 2019-02-18 DIAGNOSIS — E119 Type 2 diabetes mellitus without complications: Secondary | ICD-10-CM | POA: Diagnosis not present

## 2019-02-22 DIAGNOSIS — Z859 Personal history of malignant neoplasm, unspecified: Secondary | ICD-10-CM | POA: Diagnosis not present

## 2019-02-22 DIAGNOSIS — Z794 Long term (current) use of insulin: Secondary | ICD-10-CM | POA: Diagnosis not present

## 2019-02-22 DIAGNOSIS — E119 Type 2 diabetes mellitus without complications: Secondary | ICD-10-CM | POA: Diagnosis not present

## 2019-02-22 DIAGNOSIS — J989 Respiratory disorder, unspecified: Secondary | ICD-10-CM | POA: Diagnosis not present

## 2019-02-22 DIAGNOSIS — I10 Essential (primary) hypertension: Secondary | ICD-10-CM | POA: Diagnosis not present

## 2019-02-22 DIAGNOSIS — E7800 Pure hypercholesterolemia, unspecified: Secondary | ICD-10-CM | POA: Diagnosis not present

## 2019-02-22 DIAGNOSIS — Z4789 Encounter for other orthopedic aftercare: Secondary | ICD-10-CM | POA: Diagnosis not present

## 2019-02-26 DIAGNOSIS — Z794 Long term (current) use of insulin: Secondary | ICD-10-CM | POA: Diagnosis not present

## 2019-02-26 DIAGNOSIS — I10 Essential (primary) hypertension: Secondary | ICD-10-CM | POA: Diagnosis not present

## 2019-02-26 DIAGNOSIS — E119 Type 2 diabetes mellitus without complications: Secondary | ICD-10-CM | POA: Diagnosis not present

## 2019-02-26 DIAGNOSIS — Z859 Personal history of malignant neoplasm, unspecified: Secondary | ICD-10-CM | POA: Diagnosis not present

## 2019-02-26 DIAGNOSIS — Z4789 Encounter for other orthopedic aftercare: Secondary | ICD-10-CM | POA: Diagnosis not present

## 2019-02-26 DIAGNOSIS — E7800 Pure hypercholesterolemia, unspecified: Secondary | ICD-10-CM | POA: Diagnosis not present

## 2019-02-26 DIAGNOSIS — J989 Respiratory disorder, unspecified: Secondary | ICD-10-CM | POA: Diagnosis not present

## 2019-02-27 ENCOUNTER — Telehealth: Payer: Self-pay

## 2019-02-27 NOTE — Telephone Encounter (Signed)
Received voice mail message from patient stating she has swelling in her legs and seems to have a lot of fluid since her surgery in September.  She requested a call back.

## 2019-02-27 NOTE — Telephone Encounter (Signed)
Spoke with patient. Advised Dr Sallyanne Kuster recommended to take Furosemide 40 mg bid x 3 consecutive days and then return to prescribed dosage of every other day.  She verbalized understanding.  ICM remote transmission scheduled for 03/04/2019.  Advised to use ER if condition worsens or becomes urgent.

## 2019-02-27 NOTE — Telephone Encounter (Signed)
ICM return call to patient.  Symptoms:  SOB: At baseline  Weight:  4 pound weight gain since 9/10 surgery 9/10 Weight 200 lbs.  10/13 Weight 204 lbs   Sleeping: No changes   Swelling: Increase in swelling of feet and her legs. Unable to wear compression stockings since surgery because she does not have anyone to help her get them on  and off daily.  Nausea: intermittent  How long experiencing symptoms: Since 9/10 shoulder surgery  Medication: Taking differently than shown in Epic.   Nephrologist prescribed Furosemide 40 mg every other day and if she has fluid then she can take 40 mg bid every other day.     Tried: increasing Furosemide 40 mg bid every other day x 2 weeks which does increase urination.  Labs:  01/17/2019 Creatinine 2.44, BUN 32, Potassium 4.2, Sodium 140, GFR 20-23 01/09/2019 Creatinine 2.47, BUN 34, Potassium 3.8, Sodium 141, GFR 19-22 07/05/2018 Creatinine 2.83, BUN 54, Potassium 4.3, Sodium 140, GFR 16-19  Device Remote Transmission Results 02/27/2019:  Thoracic impedance decrease starting prior to surgery suggesting ongoing possible fluid accumulation.  Fluid index remains > threshold since 01/13/2019.  Will send to Dr. Sallyanne Kuster for review/recommendations.   Advised to use ER if condition worsens.

## 2019-02-27 NOTE — Telephone Encounter (Signed)
Please take furosemide 40 mg twice daily for 3 consecutive days, then go back to her previous every other day prescription

## 2019-02-28 DIAGNOSIS — Z4789 Encounter for other orthopedic aftercare: Secondary | ICD-10-CM | POA: Diagnosis not present

## 2019-02-28 DIAGNOSIS — I10 Essential (primary) hypertension: Secondary | ICD-10-CM | POA: Diagnosis not present

## 2019-02-28 DIAGNOSIS — E119 Type 2 diabetes mellitus without complications: Secondary | ICD-10-CM | POA: Diagnosis not present

## 2019-02-28 DIAGNOSIS — Z794 Long term (current) use of insulin: Secondary | ICD-10-CM | POA: Diagnosis not present

## 2019-02-28 DIAGNOSIS — J989 Respiratory disorder, unspecified: Secondary | ICD-10-CM | POA: Diagnosis not present

## 2019-02-28 DIAGNOSIS — E7800 Pure hypercholesterolemia, unspecified: Secondary | ICD-10-CM | POA: Diagnosis not present

## 2019-02-28 DIAGNOSIS — Z859 Personal history of malignant neoplasm, unspecified: Secondary | ICD-10-CM | POA: Diagnosis not present

## 2019-03-01 DIAGNOSIS — E7800 Pure hypercholesterolemia, unspecified: Secondary | ICD-10-CM | POA: Diagnosis not present

## 2019-03-01 DIAGNOSIS — J989 Respiratory disorder, unspecified: Secondary | ICD-10-CM | POA: Diagnosis not present

## 2019-03-01 DIAGNOSIS — E119 Type 2 diabetes mellitus without complications: Secondary | ICD-10-CM | POA: Diagnosis not present

## 2019-03-01 DIAGNOSIS — Z859 Personal history of malignant neoplasm, unspecified: Secondary | ICD-10-CM | POA: Diagnosis not present

## 2019-03-01 DIAGNOSIS — Z794 Long term (current) use of insulin: Secondary | ICD-10-CM | POA: Diagnosis not present

## 2019-03-01 DIAGNOSIS — I10 Essential (primary) hypertension: Secondary | ICD-10-CM | POA: Diagnosis not present

## 2019-03-01 DIAGNOSIS — Z4789 Encounter for other orthopedic aftercare: Secondary | ICD-10-CM | POA: Diagnosis not present

## 2019-03-04 ENCOUNTER — Ambulatory Visit (INDEPENDENT_AMBULATORY_CARE_PROVIDER_SITE_OTHER): Payer: Medicare HMO

## 2019-03-04 DIAGNOSIS — E119 Type 2 diabetes mellitus without complications: Secondary | ICD-10-CM | POA: Diagnosis not present

## 2019-03-04 DIAGNOSIS — E7800 Pure hypercholesterolemia, unspecified: Secondary | ICD-10-CM | POA: Diagnosis not present

## 2019-03-04 DIAGNOSIS — I5042 Chronic combined systolic (congestive) and diastolic (congestive) heart failure: Secondary | ICD-10-CM | POA: Diagnosis not present

## 2019-03-04 DIAGNOSIS — J989 Respiratory disorder, unspecified: Secondary | ICD-10-CM | POA: Diagnosis not present

## 2019-03-04 DIAGNOSIS — Z95810 Presence of automatic (implantable) cardiac defibrillator: Secondary | ICD-10-CM | POA: Diagnosis not present

## 2019-03-04 DIAGNOSIS — I10 Essential (primary) hypertension: Secondary | ICD-10-CM | POA: Diagnosis not present

## 2019-03-04 DIAGNOSIS — Z4789 Encounter for other orthopedic aftercare: Secondary | ICD-10-CM | POA: Diagnosis not present

## 2019-03-04 DIAGNOSIS — Z794 Long term (current) use of insulin: Secondary | ICD-10-CM | POA: Diagnosis not present

## 2019-03-04 DIAGNOSIS — Z859 Personal history of malignant neoplasm, unspecified: Secondary | ICD-10-CM | POA: Diagnosis not present

## 2019-03-05 DIAGNOSIS — Z794 Long term (current) use of insulin: Secondary | ICD-10-CM | POA: Diagnosis not present

## 2019-03-05 DIAGNOSIS — E7800 Pure hypercholesterolemia, unspecified: Secondary | ICD-10-CM | POA: Diagnosis not present

## 2019-03-05 DIAGNOSIS — Z859 Personal history of malignant neoplasm, unspecified: Secondary | ICD-10-CM | POA: Diagnosis not present

## 2019-03-05 DIAGNOSIS — Z4789 Encounter for other orthopedic aftercare: Secondary | ICD-10-CM | POA: Diagnosis not present

## 2019-03-05 DIAGNOSIS — I10 Essential (primary) hypertension: Secondary | ICD-10-CM | POA: Diagnosis not present

## 2019-03-05 DIAGNOSIS — E119 Type 2 diabetes mellitus without complications: Secondary | ICD-10-CM | POA: Diagnosis not present

## 2019-03-05 DIAGNOSIS — J989 Respiratory disorder, unspecified: Secondary | ICD-10-CM | POA: Diagnosis not present

## 2019-03-06 NOTE — Progress Notes (Signed)
EPIC Encounter for ICM Monitoring  Patient Name: Patricia Morgan is a 68 y.o. female Date: 03/06/2019 Primary Care Physican: Antony Contras, MD Primary Cardiologist:Cro[place] Electrophysiologist:[place]u Nephrologist:[place] 02/26/2019 Weight: 204 lbs   Attempted call to patient and unable to reach.  Left message to return call. Transmission reviewed.    OptivolThoracic impedancetrending just below baseline normal suggesting fluid accumulation.  Dr Sallyanne Kuster prescribed Furosemide 40 mg bid x 3 days due to patient's call regarding fluid symptoms on 02/27/2019.  Prescribedby nephrologist to takeFurosemide40 mg1 tablet (40 mg)everyday and maytake extra 40 mg bid every other day if she has fluid symptoms.       Labs:       01/17/2019 Creatinine 2.44, BUN 32, Potassium 4.2, Sodium 140, GFR 20-23      01/09/2019 Creatinine 2.47, BUN 34, Potassium 3.8, Sodium 141, GFR 19-22      07/05/2018 Creatinine 2.83, BUN 54, Potassium 4.3, Sodium 140, GFR 16-19  Recommendations: Unable to reach.    Follow-up plan: ICM clinic phone appointment on 03/12/2019 to recheck fluid levels.   91 day device clinic remote transmission 04/03/2019.  Office appt 04/01/2019 with Dr. Sallyanne Kuster.    Copy of ICM check sent to Dr. Sallyanne Kuster.   3 month ICM trend: 03/04/2019    1 Year ICM trend:       Rosalene Billings, RN 03/06/2019 5:00 PM

## 2019-03-08 DIAGNOSIS — Z794 Long term (current) use of insulin: Secondary | ICD-10-CM | POA: Diagnosis not present

## 2019-03-08 DIAGNOSIS — E119 Type 2 diabetes mellitus without complications: Secondary | ICD-10-CM | POA: Diagnosis not present

## 2019-03-08 DIAGNOSIS — Z4789 Encounter for other orthopedic aftercare: Secondary | ICD-10-CM | POA: Diagnosis not present

## 2019-03-08 DIAGNOSIS — J989 Respiratory disorder, unspecified: Secondary | ICD-10-CM | POA: Diagnosis not present

## 2019-03-08 DIAGNOSIS — I10 Essential (primary) hypertension: Secondary | ICD-10-CM | POA: Diagnosis not present

## 2019-03-08 DIAGNOSIS — Z859 Personal history of malignant neoplasm, unspecified: Secondary | ICD-10-CM | POA: Diagnosis not present

## 2019-03-08 DIAGNOSIS — E7800 Pure hypercholesterolemia, unspecified: Secondary | ICD-10-CM | POA: Diagnosis not present

## 2019-03-11 ENCOUNTER — Other Ambulatory Visit: Payer: Self-pay | Admitting: Cardiovascular Disease

## 2019-03-11 DIAGNOSIS — Z859 Personal history of malignant neoplasm, unspecified: Secondary | ICD-10-CM | POA: Diagnosis not present

## 2019-03-11 DIAGNOSIS — J989 Respiratory disorder, unspecified: Secondary | ICD-10-CM | POA: Diagnosis not present

## 2019-03-11 DIAGNOSIS — I10 Essential (primary) hypertension: Secondary | ICD-10-CM | POA: Diagnosis not present

## 2019-03-11 DIAGNOSIS — E119 Type 2 diabetes mellitus without complications: Secondary | ICD-10-CM | POA: Diagnosis not present

## 2019-03-11 DIAGNOSIS — Z794 Long term (current) use of insulin: Secondary | ICD-10-CM | POA: Diagnosis not present

## 2019-03-11 DIAGNOSIS — Z4789 Encounter for other orthopedic aftercare: Secondary | ICD-10-CM | POA: Diagnosis not present

## 2019-03-11 DIAGNOSIS — E7800 Pure hypercholesterolemia, unspecified: Secondary | ICD-10-CM | POA: Diagnosis not present

## 2019-03-12 ENCOUNTER — Ambulatory Visit (INDEPENDENT_AMBULATORY_CARE_PROVIDER_SITE_OTHER): Payer: Medicare HMO

## 2019-03-12 DIAGNOSIS — I5042 Chronic combined systolic (congestive) and diastolic (congestive) heart failure: Secondary | ICD-10-CM

## 2019-03-12 DIAGNOSIS — Z95810 Presence of automatic (implantable) cardiac defibrillator: Secondary | ICD-10-CM

## 2019-03-13 DIAGNOSIS — Z859 Personal history of malignant neoplasm, unspecified: Secondary | ICD-10-CM | POA: Diagnosis not present

## 2019-03-13 DIAGNOSIS — E119 Type 2 diabetes mellitus without complications: Secondary | ICD-10-CM | POA: Diagnosis not present

## 2019-03-13 DIAGNOSIS — I10 Essential (primary) hypertension: Secondary | ICD-10-CM | POA: Diagnosis not present

## 2019-03-13 DIAGNOSIS — Z4789 Encounter for other orthopedic aftercare: Secondary | ICD-10-CM | POA: Diagnosis not present

## 2019-03-13 DIAGNOSIS — J989 Respiratory disorder, unspecified: Secondary | ICD-10-CM | POA: Diagnosis not present

## 2019-03-13 DIAGNOSIS — Z794 Long term (current) use of insulin: Secondary | ICD-10-CM | POA: Diagnosis not present

## 2019-03-13 DIAGNOSIS — E7800 Pure hypercholesterolemia, unspecified: Secondary | ICD-10-CM | POA: Diagnosis not present

## 2019-03-13 NOTE — Progress Notes (Signed)
TY

## 2019-03-13 NOTE — Progress Notes (Signed)
EPIC Encounter for ICM Monitoring  Patient Name: Patricia Morgan is a 68 y.o. female Date: 03/13/2019 Primary Care Physican: Antony Contras, MD Primary Cardiologist:[place]u Electrophysiologist:Croitoru Nephrologist:[place] 02/26/2019 Weight: 204 lbs   Transmission reviewed.    OptivolThoracic impedancetrending close to baseline normal.  Prescribedby nephrologist to takeFurosemide40 mg1 tablet (40 mg)everyday and maytake extra 40 mg bid every other day if she has fluid symptoms.       Labs:      01/17/2019 Creatinine 2.44, BUN 32, Potassium 4.2, Sodium 140, GFR 20-23      01/09/2019 Creatinine 2.47, BUN 34, Potassium 3.8, Sodium 141, GFR 19-22      07/05/2018 Creatinine 2.83, BUN 54, Potassium 4.3, Sodium 140, GFR 16-19  Recommendations: No changes  Follow-up plan: ICM clinic phone appointment on 04/09/2019.   91 day device clinic remote transmission 04/08/2019.  Office appt 04/01/2019 with Dr. Sallyanne Kuster.    Copy of ICM check sent to Dr. Sallyanne Kuster.   3 month ICM trend: 03/12/2019    1 Year ICM trend:       Rosalene Billings, RN 03/13/2019 5:10 PM

## 2019-03-14 DIAGNOSIS — E119 Type 2 diabetes mellitus without complications: Secondary | ICD-10-CM | POA: Diagnosis not present

## 2019-03-14 DIAGNOSIS — E7800 Pure hypercholesterolemia, unspecified: Secondary | ICD-10-CM | POA: Diagnosis not present

## 2019-03-14 DIAGNOSIS — Z794 Long term (current) use of insulin: Secondary | ICD-10-CM | POA: Diagnosis not present

## 2019-03-14 DIAGNOSIS — J989 Respiratory disorder, unspecified: Secondary | ICD-10-CM | POA: Diagnosis not present

## 2019-03-14 DIAGNOSIS — Z4789 Encounter for other orthopedic aftercare: Secondary | ICD-10-CM | POA: Diagnosis not present

## 2019-03-14 DIAGNOSIS — I10 Essential (primary) hypertension: Secondary | ICD-10-CM | POA: Diagnosis not present

## 2019-03-14 DIAGNOSIS — Z859 Personal history of malignant neoplasm, unspecified: Secondary | ICD-10-CM | POA: Diagnosis not present

## 2019-03-19 DIAGNOSIS — M25512 Pain in left shoulder: Secondary | ICD-10-CM | POA: Diagnosis not present

## 2019-03-26 DIAGNOSIS — M25512 Pain in left shoulder: Secondary | ICD-10-CM | POA: Diagnosis not present

## 2019-03-29 DIAGNOSIS — M25512 Pain in left shoulder: Secondary | ICD-10-CM | POA: Diagnosis not present

## 2019-04-01 ENCOUNTER — Encounter: Payer: Self-pay | Admitting: Cardiovascular Disease

## 2019-04-01 ENCOUNTER — Ambulatory Visit (INDEPENDENT_AMBULATORY_CARE_PROVIDER_SITE_OTHER): Payer: Medicare HMO | Admitting: Cardiovascular Disease

## 2019-04-01 ENCOUNTER — Other Ambulatory Visit: Payer: Self-pay

## 2019-04-01 VITALS — BP 115/75 | HR 73 | Temp 97.0°F | Ht 61.0 in | Wt 204.0 lb

## 2019-04-01 DIAGNOSIS — I428 Other cardiomyopathies: Secondary | ICD-10-CM

## 2019-04-01 DIAGNOSIS — E1169 Type 2 diabetes mellitus with other specified complication: Secondary | ICD-10-CM

## 2019-04-01 DIAGNOSIS — I4581 Long QT syndrome: Secondary | ICD-10-CM | POA: Diagnosis not present

## 2019-04-01 DIAGNOSIS — Z95810 Presence of automatic (implantable) cardiac defibrillator: Secondary | ICD-10-CM

## 2019-04-01 DIAGNOSIS — Z6838 Body mass index (BMI) 38.0-38.9, adult: Secondary | ICD-10-CM

## 2019-04-01 DIAGNOSIS — E669 Obesity, unspecified: Secondary | ICD-10-CM

## 2019-04-01 DIAGNOSIS — I5042 Chronic combined systolic (congestive) and diastolic (congestive) heart failure: Secondary | ICD-10-CM

## 2019-04-01 DIAGNOSIS — G4733 Obstructive sleep apnea (adult) (pediatric): Secondary | ICD-10-CM

## 2019-04-01 DIAGNOSIS — I10 Essential (primary) hypertension: Secondary | ICD-10-CM | POA: Diagnosis not present

## 2019-04-01 DIAGNOSIS — N184 Chronic kidney disease, stage 4 (severe): Secondary | ICD-10-CM

## 2019-04-01 DIAGNOSIS — Z86718 Personal history of other venous thrombosis and embolism: Secondary | ICD-10-CM | POA: Diagnosis not present

## 2019-04-01 DIAGNOSIS — Z7901 Long term (current) use of anticoagulants: Secondary | ICD-10-CM

## 2019-04-01 LAB — BASIC METABOLIC PANEL
BUN/Creatinine Ratio: 15 (ref 12–28)
BUN: 35 mg/dL — ABNORMAL HIGH (ref 8–27)
CO2: 23 mmol/L (ref 20–29)
Calcium: 9.9 mg/dL (ref 8.7–10.3)
Chloride: 103 mmol/L (ref 96–106)
Creatinine, Ser: 2.37 mg/dL — ABNORMAL HIGH (ref 0.57–1.00)
GFR calc Af Amer: 23 mL/min/{1.73_m2} — ABNORMAL LOW (ref >59)
GFR calc non Af Amer: 20 mL/min/{1.73_m2} — ABNORMAL LOW (ref >59)
Glucose: 107 mg/dL — ABNORMAL HIGH (ref 65–99)
Potassium: 3.7 mmol/L (ref 3.5–5.2)
Sodium: 142 mmol/L (ref 134–144)

## 2019-04-01 NOTE — Patient Instructions (Signed)
Medication Instructions:  No changes *If you need a refill on your cardiac medications before your next appointment, please call your pharmacy*  Lab Work: Your provider would like for you to have the following labs today: BMET  If you have labs (blood work) drawn today and your tests are completely normal, you will receive your results only by: [name] MyChart Message (if you have MyChart) OR . A paper copy in the mail If you have any lab test that is abnormal or we need to change your treatment, we will call you to review the results.  Testing/Procedures: None ordered  Follow-Up: At [facility], you and your health needs are our priority.  As part of our continuing mission to provide you with exceptional heart care, we have created designated Provider Care Teams.  These Care Teams include your primary Cardiologist (physician) and Advanced Practice Providers (APPs -  Physician Assistants and Nurse Practitioners) who all work together to provide you with the care you need, when you need it.  Your next appointment:   6 months  The format for your next appointment:   In Person  Provider:   Sanda Klein, MD

## 2019-04-01 NOTE — Progress Notes (Signed)
Cardiology Office Note    Date:  04/03/2019   ID:  Patricia Morgan, DOB [birth year], MRN [id number]  PCP:  Antony Contras, MD  Cardiologist:   Sanda Klein, MD   Chief Complaint  Patient presents with   Shortness of Breath   Edema    History of Present Illness:  Patricia Morgan is a 68 y.o. 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  She gained some fluid after she underwent left shoulder surgery.  She has been having problems with edema and shortness of breath.  She has increased her furosemide to 80 mg daily for 1 week and the edema has improved a little bit and she no longer has orthopnea.  She still has NYHA functional class II-3 exertional dyspnea she remains short of breath with light activity.  She still has 1-2+ edema in the evenings but it resolves by the next morning.  Interrogation of her device shows a very sharp drop in the thoracic impedance at the time of surgery in early September and it is hard to say whether this was impacted by the vicinity of the surgical site to her device, but there was also rapid improvement never returning to her previous baseline.  Just recently, thoracic impedance seems to be trending back to normal.  At home, without her breast prosthesis she weighs 197 pounds.  In our office she weighs 204 pounds.  This is actually similar to her preoperative weight.  It is been just over a year since elective primary prevention implantation of her defibrillator ([employer]tr[employer] VISIA AF).  Device interrogation today shows normal functioning.  Lead parameters are excellent.  Estimated generator is 11 years.  She has not had any need for ventricular pacing or any episodes of ventricular tachycardia  or atrial fibrillation.   We are struggling to maintain the balance between her renal function and heart problems.  Her nephrologist is Dr. Vanetta Mulders.  We decided to recheck her renal parameters and electrolytes today, before making any adjustments in her furosemide dose.  Her most recent echo performed in October 2019 showed severely decreased left ventricular systolic function with an ejection fraction of 25%.  A nuclear stress test was performed and shows a fixed inferior wall defect that I suspect is diaphragmatic attenuation artifact.  There was no reversible ischemia.  A similar EF of 22% was reported.  She is compliant with carvedilol in the maximum usual dose and also takes hydralazine/nitrates at the highest dose tolerated by her blood pressure.  She is not receiving RAAS inhibitors due to chronic kidney disease.  Coronary angiography performed June 2017 showed no evidence of coronary artery disease but confirmed elevated filling pressure. PA pressure was 57/18 in the setting of a mean pulmonary wedge pressure of 33 mmHg. Echo showed ejection fraction of 45%. Nuclear scintigraphy calculated ejection fraction of 38%. It also suggested the presence of perfusion abnormalities, false positive by coronary angiography. She sees Dr. Moshe Cipro for chronic kidney disease and her baseline creatinine is around 2.0-2.5. Her primary care physician is Dr. Antony Contras. Dr. Elsworth Soho manages her sleep apnea treatment.  Her sister [name] had coronary artery disease and long QT syndrome and passed away in 08/06/17.  Past Medical History:  Diagnosis Date   ANEMIA-NOS    BREAST CANCER, HX OF 1994 & 1995   1994>>recurrence  in 1995, R breast, s/p lumpectomy>>mastectomy   Cancer (HCC)    Cardiomyopathy, nonischemic (HCC)    CHF (congestive heart failure) (HCC)    chronic systolic and diastolic CHF   CHRONIC KIDNEY DISEASE STAGE III (MODERATE)    Clotting disorder ([place])    on eliquis for  Factor 5 disorder   Colon polyps    DIABETES MELLITUS, TYPE II    Diverticulosis    HYPERLIPIDEMIA    HYPERTENSION    ILD (interstitial lung disease) ([place])    10/2018 CT findings suggestive of sarcoidosis; followed by Dr. Elsworth Soho   OBSTRUCTIVE SLEEP APNEA    CPAP   Personal history of chemotherapy    Proteinuria    PULMONARY EMBOLISM 09/24/2008   anticoag thru 03/2010   Rotator cuff tear    Left   Shoulder impingement, left    Sleep apnea    Wears CPAP nightly    Past Surgical History:  Procedure Laterality Date   BREAST SURGERY     CARDIAC CATHETERIZATION N/A 11/02/2015   Procedure: Right/Left Heart Cath and Coronary Angiography;  Surgeon: Burnell Blanks, MD;  Location: [facility];  Service: Cardiovascular;  Laterality: N/A;   DILATION AND CURETTAGE OF UTERUS     ICD IMPLANT N/A 03/14/2018   Procedure: ICD IMPLANT;  Surgeon: Sanda Klein, MD;  Location: [facility];  Service: Cardiovascular;  Laterality: N/A;   insertion port a cath     MASTECTOMY Right 1995   Right   PORT-A-CATH REMOVAL     SHOULDER ARTHROSCOPY WITH ROTATOR CUFF REPAIR AND SUBACROMIAL DECOMPRESSION Left 01/24/2019   Procedure: SHOULDER ARTHROSCOPY WITH ROTATOR CUFF REPAIR AND SUBACROMIAL DECOMPRESSION;  Surgeon: Nicholes Stairs, MD;  Location: [facility];  Service: Orthopedics;  Laterality: Left;  2.5 hrs    Current Medications: Outpatient Medications Prior to Visit  Medication Sig Dispense Refill   acetaminophen (TYLENOL) 500 MG tablet Take 500 mg by mouth every 6 (six) hours as needed for mild pain.     allopurinol (ZYLOPRIM) 100 MG tablet Take 100 mg by mouth 2 (two) times daily.      apixaban (ELIQUIS) 5 MG TABS tablet Take 1 tablet (5 mg total) by mouth 2 (two) times daily. 180 tablet 6   Blood Glucose Monitoring Suppl (TRUE METRIX AIR GLUCOSE METER) w/Device KIT      carvedilol (COREG) 25 MG tablet TAKE 1 TABLET TWICE DAILY WITH MEALS 180 tablet 0    Cholecalciferol (VITAMIN D-3) 5000 units TABS Take 5,000 Units by mouth daily.      colchicine 0.6 MG tablet Take 0.6 mg by mouth daily.   1   diphenhydrAMINE (BENADRYL) 25 MG tablet Take 25 mg by mouth daily.     ferrous sulfate 325 (65 FE) MG tablet Take 325 mg by mouth daily with breakfast.     hydrALAZINE (APRESOLINE) 10 MG tablet TAKE 1 TABLET (10 MG) BY MOUTH THREE TIMES DAILY 270 tablet 3   Incontinence Supply Disposable (UNDERGARMENT EXTRA ABSORBENT) MISC      isosorbide mononitrate (IMDUR) 30 MG 24 hr tablet TAKE 1 TABLET (30 MG) DAILY 90 tablet 3   Lactobacillus (CULTURELLE DIGESTIVE WOMENS PO) Take 1 capsule by mouth daily.     Lancet Devices (TRUEDRAW LANCING DEVICE) MISC TRUEdraw Lancing Device     rosuvastatin (CRESTOR) 10 MG tablet TAKE 1 TABLET EVERY DAY 90 tablet 3   TRUE METRIX BLOOD GLUCOSE TEST test strip      TURMERIC PO Take 1,000 mg  by mouth daily.     vitamin B-12 (CYANOCOBALAMIN) 1000 MCG tablet Take 1,000 mcg by mouth daily.     vitamin E 400 UNIT capsule Take 400 Units by mouth daily.     furosemide (LASIX) 40 MG tablet Take 2 tablets (80 mg total) by mouth 2 (two) times daily. Take 80 mg in the morning and 40 mg in the evening (Patient taking differently: Take 40 mg by mouth 2 (two) times daily as needed for fluid or edema. ) 270 tablet 1   Insulin Glargine (LANTUS SOLOSTAR) 100 UNIT/ML Solostar Pen Inject 30 Units into the skin 2 (two) times daily as needed (for blood sugar greater than 200.).      insulin lispro (HUMALOG KWIKPEN) 100 UNIT/ML KiwkPen Inject 10 Units into the skin 3 (three) times daily as needed (for blood sugar greater than 200.).      oxyCODONE (ROXICODONE) 5 MG immediate release tablet Take 1 tablet (5 mg total) by mouth every 6 (six) hours as needed for severe pain. (Patient not taking: Reported on 04/01/2019) 40 tablet 0   No facility-administered medications prior to visit.      Allergies:   Patient has no known allergies.    Social History   Socioeconomic History   Marital status: Divorced    Spouse name: Not on file   Number of children: 2   Years of education: 12   Highest education level: Not on file  Occupational History   Occupation: retired  Scientist, product/process development strain: Not on file   Food insecurity    Worry: Not on file    Inability: Not on Lexicographer needs    Medical: Not on file    Non-medical: Not on file  Tobacco Use   Smoking status: Never Smoker   Smokeless tobacco: Never Used   Tobacco comment: Lives alone-divorced  Substance and Sexual Activity   Alcohol use: Yes    Alcohol/week: 0.0 standard drinks    Comment: occ   Drug use: No   Sexual activity: Not on file  Lifestyle   Physical activity    Days per week: Not on file    Minutes per session: Not on file   Stress: Not on file  Relationships   Social connections    Talks on phone: Not on file    Gets together: Not on file    Attends religious service: Not on file    Active member of club or organization: Not on file    Attends meetings of clubs or organizations: Not on file    Relationship status: Not on file  Other Topics Concern   Not on file  Social History Narrative   Not on file     Family History:  The patient's family history includes Asthma in her father; Diabetes in her sister; Parkinson's disease in her mother. Her sister had long QT syndrome, but no documented ventricular arrhythmia  ROS:   Please see the history of present illness.    ROS  all other systems reviewed and are negative  PHYSICAL EXAM:   VS:  BP 115/75    Pulse 73    Temp (!) 97 F (36.1 C)    Ht 5' 1"  (1.549 m)    Wt 204 lb (92.5 kg)    BMI 38.55 kg/m       General: Alert, oriented x3, no distress, well-healed left subclavian defibrillator site Head: no evidence of trauma, PERRL, EOMI, no exophtalmos or  lid lag, no myxedema, no xanthelasma; normal ears, nose and oropharynx Neck: 6-8 cm  elevation in jugular venous pulsations and prompt hepatojugular reflux; brisk carotid pulses without delay and no carotid bruits Chest: clear to auscultation, no signs of consolidation by percussion or palpation, normal fremitus, symmetrical and full respiratory excursions Cardiovascular: normal position and quality of the apical impulse, regular rhythm, normal first and second heart sounds, no murmurs, rubs or gallops Abdomen: no tenderness or distention, no masses by palpation, no abnormal pulsatility or arterial bruits, normal bowel sounds, no hepatosplenomegaly Extremities: no clubbing, cyanosis; there is symmetrical 1-2+ ankle swelling, mild pedal edema; 2+ radial, ulnar and brachial pulses bilaterally; 2+ right femoral, posterior tibial and dorsalis pedis pulses; 2+ left femoral, posterior tibial and dorsalis pedis pulses; no subclavian or femoral bruits Neurological: grossly nonfocal Psych: Normal mood and affect    Wt Readings from Last 3 Encounters:  04/01/19 204 lb (92.5 kg)  01/24/19 206 lb 9.1 oz (93.7 kg)  01/17/19 206 lb 9.6 oz (93.7 kg)      Studies/Labs Reviewed:   EKG:  EKG is ordered today.  It shows sinus rhythm with first-degree AV block, mildly elevated QTC 482 ms  Recent Labs: 01/09/2019: ALT 19 01/17/2019: Hemoglobin 10.6; Platelets 150 04/01/2019: BUN 35; Creatinine, Ser 2.37; Potassium 3.7; Sodium 142   Lipid Panel    Component Value Date/Time   CHOL 184 09/13/2012 1357   TRIG 142 09/13/2012 1357   HDL 45 09/13/2012 1357   CHOLHDL 4.1 09/13/2012 1357   VLDL 28 09/13/2012 1357   LDLCALC 111 (H) 09/13/2012 1357   LDLDIRECT 141.3 10/14/2008 1004      ASSESSMENT:    1. Chronic combined systolic and diastolic heart failure ([place])   2. Nonischemic cardiomyopathy ([place])   3. Long Q-T syndrome   4. ICD (implantable cardioverter-defibrillator) in place   5. History of venous thromboembolism   6. Essential hypertension   7. Long term current use of  anticoagulant   8. OSA (obstructive sleep apnea)   9. CKD (chronic kidney disease) stage 4, GFR 15-29 ml/min (HCC)   10. Class 2 severe obesity due to excess calories with serious comorbidity and body mass index (BMI) of 38.0 to 38.9 in adult ([place])   11. Diabetes mellitus type 2 in obese [facility])      PLAN:  In order of problems listed above:  1. CHF: HAs clinical evidence of hypervolemia (confirmed by OptiVol), class II-III dyspnea.  She is on maximum dose carvedilol.  Unable to prescribe RAAS inhibitors due to renal dysfunction, on hydralazine/nitrates vasodilator therapy and maximum dose of carvedilol.  Plan to temporarily increase her furosemide with repeat basic metabolic panel when she returns to see Dr. Moshe Cipro in a few weeks. 2. CMP: Nonischemic mechanism.  Normal coronary arteries a couple of years ago, by angiography.  Possibly received Adriamycin for chemotherapy for breast cancer in 1994.  Might also have some familial cardiomyopathy. 3. Long QT: Her sister had long QT syndrome.  Teresa's QT interval has always been mildly prolonged generally around 460-470 ms, on one occasion up to 490.  Similar range today.  Potassium was okay. 4. ICD: Normal device function.  Comprehensive downloads every 3 months.  5. Recurrent DVT/PE:  hypercoagulable state due to heterozygous factor V Leiden. Should remain on lifelong anticoagulation.   6. Eliquis: No bleeding complications. 7. HTN: Blood pressure is in target range. 8. OSA: Reports 100% compliance with CPAP.  Also reports benefit from this treatment.  Followed  by Dr. Elsworth Soho. 9. CKD: most recent creatinine 2.37 at baseline or slightly better than baseline.  Sees Dr. Moshe Cipro.   GFR approximately 20. 10. Obesity: Still trying to lose weight, but she has lost around the last few months.  Her "dry weight" is a moving target. 11. DM: Most recent hemoglobin A1c was excellent at 5.9%.   Medication Adjustments/Labs and Tests Ordered: Current  medicines are reviewed at length with the patient today.  Concerns regarding medicines are outlined above.  Medication changes, Labs and Tests ordered today are listed in the Patient Instructions below. Patient Instructions  Medication Instructions:  No changes *If you need a refill on your cardiac medications before your next appointment, please call your pharmacy*  Lab Work: Your provider would like for you to have the following labs today: BMET  If you have labs (blood work) drawn today and your tests are completely normal, you will receive your results only by:  [facility] (if you have MyChart) OR  A paper copy in the mail If you have any lab test that is abnormal or we need to change your treatment, we will call you to review the results.  Testing/Procedures: None ordered  Follow-Up: At [facility], you and your health needs are our priority.  As part of our continuing mission to provide you with exceptional heart care, we have created designated Provider Care Teams.  These Care Teams include your primary Cardiologist (physician) and Advanced Practice Providers (APPs -  Physician Assistants and Nurse Practitioners) who all work together to provide you with the care you need, when you need it.  Your next appointment:   6 months  The format for your next appointment:   In Person  Provider:   Sanda Klein, MD      Signed, Sanda Klein, MD  04/03/2019 4:02 PM    [facility] [address], [place], [place]  [address] Phone: [phone]; Fax: [phone]

## 2019-04-02 ENCOUNTER — Telehealth: Payer: Self-pay | Admitting: *Deleted

## 2019-04-02 DIAGNOSIS — M25512 Pain in left shoulder: Secondary | ICD-10-CM | POA: Diagnosis not present

## 2019-04-02 MED ORDER — FUROSEMIDE 40 MG PO TABS: 80.0000 mg | ORAL_TABLET | Freq: Every day | ORAL | 11 refills | Status: DC

## 2019-04-02 MED ORDER — POTASSIUM CHLORIDE ER 20 MEQ PO TBCR: EXTENDED_RELEASE_TABLET | ORAL | 0 refills | Status: DC

## 2019-04-02 NOTE — Telephone Encounter (Signed)
Patient made aware of results and verbalized understanding.  The patient will take Furosemide 80 mg bid for two days along with Potassium 20 mEq once daily for two days only.  She will then take Furosemide 80 mg daily and stop the potassium. She has an appointment with Nephrology on 04/29/2019

## 2019-04-02 NOTE — Telephone Encounter (Signed)
-----   Message from Sanda Klein, MD sent at 04/01/2019  4:40 PM EST ----- Kidney function at baseline. Potassium low normal range. Please take furosemide 80 mg TWICE daily and KCl 20 mEq ONCE daily for just 2 days (then back to the 80 mg furosemide once daily dose, without daily potassium). She will have repeat labs at her Nephrology appt, which I believe she said was next month with Dr. Moshe Cipro.

## 2019-04-03 ENCOUNTER — Encounter: Payer: Self-pay | Admitting: Cardiovascular Disease

## 2019-04-08 ENCOUNTER — Ambulatory Visit (INDEPENDENT_AMBULATORY_CARE_PROVIDER_SITE_OTHER): Payer: Medicare HMO | Admitting: *Deleted

## 2019-04-08 ENCOUNTER — Ambulatory Visit: Payer: Medicare HMO | Admitting: Hematology & Oncology

## 2019-04-08 ENCOUNTER — Other Ambulatory Visit: Payer: Medicare HMO

## 2019-04-08 DIAGNOSIS — I5042 Chronic combined systolic (congestive) and diastolic (congestive) heart failure: Secondary | ICD-10-CM

## 2019-04-08 LAB — CUP PACEART REMOTE DEVICE CHECK
Battery Remaining Longevity: 133 mo
Battery Voltage: 3.05 V
Brady Statistic RV Percent Paced: 0.01 %
Date Time Interrogation Session: 20201123022824
HighPow Impedance: 64 Ohm
Implantable Lead Implant Date: 20191030
Implantable Lead Location: 753860
Implantable Pulse Generator Implant Date: 20191030
Lead Channel Impedance Value: 342 Ohm
Lead Channel Impedance Value: 399 Ohm
Lead Channel Pacing Threshold Amplitude: 0.75 V
Lead Channel Pacing Threshold Pulse Width: 0.4 ms
Lead Channel Sensing Intrinsic Amplitude: 20.625 mV
Lead Channel Sensing Intrinsic Amplitude: 20.625 mV
Lead Channel Setting Pacing Amplitude: 2 V
Lead Channel Setting Pacing Pulse Width: 0.4 ms
Lead Channel Setting Sensing Sensitivity: 0.3 mV

## 2019-04-09 ENCOUNTER — Ambulatory Visit (INDEPENDENT_AMBULATORY_CARE_PROVIDER_SITE_OTHER): Payer: Medicare HMO

## 2019-04-09 DIAGNOSIS — I5042 Chronic combined systolic (congestive) and diastolic (congestive) heart failure: Secondary | ICD-10-CM

## 2019-04-09 DIAGNOSIS — M25512 Pain in left shoulder: Secondary | ICD-10-CM | POA: Diagnosis not present

## 2019-04-09 DIAGNOSIS — Z95810 Presence of automatic (implantable) cardiac defibrillator: Secondary | ICD-10-CM

## 2019-04-10 NOTE — Progress Notes (Signed)
Thanks. Looks good. Prepare for a lot of Optivol elevation next week.Marland KitchenMarland Kitchen

## 2019-04-10 NOTE — Progress Notes (Signed)
EPIC Encounter for ICM Monitoring  Patient Name: Patricia Morgan is a 68 y.o. female Date: 04/10/2019 Primary Care Physican: Antony Contras, MD Primary Cardiologist:[place] Electrophysiologist:[place] Nephrologist:[place] 04/10/2019 Weight: 202 lbs   Spoke with patient and she said she is doing well.  Dr Sallyanne Kuster increased maintenance Furosemide dosage to 80 mg.   OptivolThoracic impedancetrending close to baseline normal.  Prescribed:Furosemide40 mgtake 2 tablets (80 mg)by mouth daily.  Labs: 04/01/2019 Creatinine 2.37, BUN 35, Potassium 3.7, Sodium 142, GFR 20-23      01/17/2019 Creatinine 2.44, BUN 32, Potassium 4.2, Sodium 140, GFR 20-23 01/09/2019 Creatinine 2.47, BUN 34, Potassium 3.8, Sodium 141, GFR 19-22 07/05/2018 Creatinine 2.83, BUN 54, Potassium 4.3, Sodium 140, GFR 16-19  Recommendations: No changes and encouraged to call if experiencing any fluid symptoms.  Follow-up plan: ICM clinic phone appointment on 05/20/2019.   91 day device clinic remote transmission 07/07/2018.   Copy of ICM check sent to Dr. Sallyanne Kuster.   3 month ICM trend: 04/08/2019    1 Year ICM trend:       Rosalene Billings, RN 04/10/2019 2:00 PM

## 2019-04-15 ENCOUNTER — Other Ambulatory Visit: Payer: Self-pay

## 2019-04-15 ENCOUNTER — Inpatient Hospital Stay: Payer: Medicare HMO | Attending: Hematology & Oncology

## 2019-04-15 ENCOUNTER — Inpatient Hospital Stay (HOSPITAL_BASED_OUTPATIENT_CLINIC_OR_DEPARTMENT_OTHER): Payer: Medicare HMO | Admitting: Hematology & Oncology

## 2019-04-15 ENCOUNTER — Encounter: Payer: Self-pay | Admitting: Hematology & Oncology

## 2019-04-15 VITALS — BP 115/50 | HR 76 | Temp 97.3°F | Resp 18 | Wt 199.0 lb

## 2019-04-15 DIAGNOSIS — N184 Chronic kidney disease, stage 4 (severe): Secondary | ICD-10-CM | POA: Diagnosis not present

## 2019-04-15 DIAGNOSIS — Z7901 Long term (current) use of anticoagulants: Secondary | ICD-10-CM | POA: Insufficient documentation

## 2019-04-15 DIAGNOSIS — Z853 Personal history of malignant neoplasm of breast: Secondary | ICD-10-CM | POA: Insufficient documentation

## 2019-04-15 DIAGNOSIS — Z79899 Other long term (current) drug therapy: Secondary | ICD-10-CM | POA: Insufficient documentation

## 2019-04-15 DIAGNOSIS — I82401 Acute embolism and thrombosis of unspecified deep veins of right lower extremity: Secondary | ICD-10-CM | POA: Diagnosis not present

## 2019-04-15 DIAGNOSIS — Z86718 Personal history of other venous thrombosis and embolism: Secondary | ICD-10-CM | POA: Diagnosis not present

## 2019-04-15 DIAGNOSIS — E0800 Diabetes mellitus due to underlying condition with hyperosmolarity without nonketotic hyperglycemic-hyperosmolar coma (NKHHC): Secondary | ICD-10-CM | POA: Diagnosis not present

## 2019-04-15 DIAGNOSIS — D6851 Activated protein C resistance: Secondary | ICD-10-CM | POA: Diagnosis not present

## 2019-04-15 DIAGNOSIS — Z95810 Presence of automatic (implantable) cardiac defibrillator: Secondary | ICD-10-CM | POA: Diagnosis not present

## 2019-04-15 DIAGNOSIS — Z95811 Presence of heart assist device: Secondary | ICD-10-CM | POA: Insufficient documentation

## 2019-04-15 DIAGNOSIS — D631 Anemia in chronic kidney disease: Secondary | ICD-10-CM | POA: Diagnosis not present

## 2019-04-15 DIAGNOSIS — N189 Chronic kidney disease, unspecified: Secondary | ICD-10-CM | POA: Insufficient documentation

## 2019-04-15 LAB — CMP (CANCER CENTER ONLY)
ALT: 11 U/L (ref 0–44)
AST: 22 U/L (ref 15–41)
Albumin: 4.4 g/dL (ref 3.5–5.0)
Alkaline Phosphatase: 145 U/L — ABNORMAL HIGH (ref 38–126)
Anion gap: 7 (ref 5–15)
BUN: 35 mg/dL — ABNORMAL HIGH (ref 8–23)
CO2: 29 mmol/L (ref 22–32)
Calcium: 10.2 mg/dL (ref 8.9–10.3)
Chloride: 104 mmol/L (ref 98–111)
Creatinine: 2.23 mg/dL — ABNORMAL HIGH (ref 0.44–1.00)
GFR, Est AFR Am: 25 mL/min — ABNORMAL LOW (ref >60)
GFR, Estimated: 22 mL/min — ABNORMAL LOW (ref >60)
Glucose, Bld: 128 mg/dL — ABNORMAL HIGH (ref 70–99)
Potassium: 4.1 mmol/L (ref 3.5–5.1)
Sodium: 140 mmol/L (ref 135–145)
Total Bilirubin: 1 mg/dL (ref 0.3–1.2)
Total Protein: 7.7 g/dL (ref 6.5–8.1)

## 2019-04-15 LAB — CBC WITH DIFFERENTIAL (CANCER CENTER ONLY)
Abs Immature Granulocytes: 0.01 10*3/uL (ref 0.00–0.07)
Basophils Absolute: 0 10*3/uL (ref 0.0–0.1)
Basophils Relative: 1 %
Eosinophils Absolute: 0.1 10*3/uL (ref 0.0–0.5)
Eosinophils Relative: 2 %
HCT: 34.4 % — ABNORMAL LOW (ref 36.0–46.0)
Hemoglobin: 10.8 g/dL — ABNORMAL LOW (ref 12.0–15.0)
Immature Granulocytes: 0 %
Lymphocytes Relative: 30 %
Lymphs Abs: 1.2 10*3/uL (ref 0.7–4.0)
MCH: 31.2 pg (ref 26.0–34.0)
MCHC: 31.4 g/dL (ref 30.0–36.0)
MCV: 99.4 fL (ref 80.0–100.0)
Monocytes Absolute: 0.4 10*3/uL (ref 0.1–1.0)
Monocytes Relative: 9 %
Neutro Abs: 2.4 10*3/uL (ref 1.7–7.7)
Neutrophils Relative %: 58 %
Platelet Count: 138 10*3/uL — ABNORMAL LOW (ref 150–400)
RBC: 3.46 MIL/uL — ABNORMAL LOW (ref 3.87–5.11)
RDW: 15.9 % — ABNORMAL HIGH (ref 11.5–15.5)
WBC Count: 4.1 10*3/uL (ref 4.0–10.5)
nRBC: 0 % (ref 0.0–0.2)

## 2019-04-15 NOTE — Progress Notes (Signed)
Hematology and Oncology Follow Up Visit  Patricia Morgan [id number] Sep 06, 1950 68 y.o. 04/15/2019   Principle Diagnosis:  Recurrent Thromboembolic disease Factor V Leiden - Heterozygote H/O locally recurrent ductal carcinoma - RIGHT breast  Current Therapy:   Eliquis 5 mg po BID   Interim History:  Ms. Patricia Morgan is here today for follow-up.  So far, she is doing quite well.  She had her left shoulder surgery back in September.  She is doing some physical therapy for this.  She is making some good progress.  She is had no problems with the Eliquis.  There is been no bleeding.  She has a defibrillator in.  Thankfully this really has not caused any problems for her.  Has not gone off.  She has had no problems with fever.  There has been no cough.  She has had no change in bowel or bladder habits.  She has had no leg swelling.  She does have issues with her kidneys.  She does have chronic renal insufficiency.  Her renal function is essentially holding steady right now.  Because of the decreased renal function, she has anemia.  We will have to see what her erythropoietin level is.  There has been no problems with headache.  She has had no mouth sores.  She has had no complaints of bony pain.  Overall, her performance status is ECOG 1.    Medications:  Allergies as of 04/15/2019   No Known Allergies     Medication List       Accurate as of April 15, 2019  1:02 PM. If you have any questions, ask your nurse or doctor.        STOP taking these medications   CULTURELLE DIGESTIVE WOMENS PO Stopped by: Volanda Napoleon, MD   Potassium Chloride ER 20 MEQ Tbcr Stopped by: Volanda Napoleon, MD   True Metrix Air Glucose Meter w/Device Kit Stopped by: Volanda Napoleon, MD   True Metrix Blood Glucose Test test strip Generic drug: glucose blood Stopped by: Volanda Napoleon, MD   TRUEdraw Lancing Device Misc Stopped by: Volanda Napoleon, MD   Undergarment Extra Absorbent Misc  Stopped by: Volanda Napoleon, MD     TAKE these medications   acetaminophen 500 MG tablet Commonly known as: TYLENOL Take 500 mg by mouth every 6 (six) hours as needed for mild pain.   allopurinol 100 MG tablet Commonly known as: ZYLOPRIM Take 100 mg by mouth 2 (two) times daily.   apixaban 5 MG Tabs tablet Commonly known as: Eliquis Take 1 tablet (5 mg total) by mouth 2 (two) times daily.   carvedilol 25 MG tablet Commonly known as: COREG TAKE 1 TABLET TWICE DAILY WITH MEALS   colchicine 0.6 MG tablet Take 0.6 mg by mouth daily.   diphenhydrAMINE 25 MG tablet Commonly known as: BENADRYL Take 25 mg by mouth daily.   ferrous sulfate 325 (65 FE) MG tablet Take 325 mg by mouth daily with breakfast.   furosemide 40 MG tablet Commonly known as: LASIX Take 2 tablets (80 mg total) by mouth daily.   hydrALAZINE 10 MG tablet Commonly known as: APRESOLINE TAKE 1 TABLET (10 MG) BY MOUTH THREE TIMES DAILY   isosorbide mononitrate 30 MG 24 hr tablet Commonly known as: IMDUR TAKE 1 TABLET (30 MG) DAILY   rosuvastatin 10 MG tablet Commonly known as: CRESTOR TAKE 1 TABLET EVERY DAY   TURMERIC PO Take 1,000 mg by mouth daily.   vitamin  B-12 1000 MCG tablet Commonly known as: CYANOCOBALAMIN Take 1,000 mcg by mouth daily.   Vitamin D-3 125 MCG (5000 UT) Tabs Take 5,000 Units by mouth daily.   vitamin E 400 UNIT capsule Take 400 Units by mouth daily.       Allergies: No Known Allergies  Past Medical History, Surgical history, Social history, and Family History were reviewed and updated.  Review of Systems: Review of Systems  Constitutional: Negative.   HENT: Negative.   Eyes: Negative.   Respiratory: Negative.   Cardiovascular: Negative.   Gastrointestinal: Negative.   Genitourinary: Negative.   Musculoskeletal: Positive for joint pain.  Skin: Negative.   Neurological: Negative.   Endo/Heme/Allergies: Negative.   Psychiatric/Behavioral: Negative.       Physical Exam:  weight is 199 lb (90.3 kg). Her temporal temperature is 97.3 F (36.3 C) (abnormal). Her blood pressure is 115/50 (abnormal) and her pulse is 76. Her respiration is 18 and oxygen saturation is 99%.   Wt Readings from Last 3 Encounters:  04/15/19 199 lb (90.3 kg)  04/01/19 204 lb (92.5 kg)  01/24/19 206 lb 9.1 oz (93.7 kg)    Physical Exam Vitals signs reviewed.  HENT:     Head: Normocephalic and atraumatic.  Eyes:     Pupils: Pupils are equal, round, and reactive to light.  Neck:     Musculoskeletal: Normal range of motion.  Cardiovascular:     Rate and Rhythm: Normal rate and regular rhythm.     Heart sounds: Normal heart sounds.  Pulmonary:     Effort: Pulmonary effort is normal.     Breath sounds: Normal breath sounds.  Abdominal:     General: Bowel sounds are normal.     Palpations: Abdomen is soft.  Musculoskeletal: Normal range of motion.        General: No tenderness or deformity.  Lymphadenopathy:     Cervical: No cervical adenopathy.  Skin:    General: Skin is warm and dry.     Findings: No erythema or rash.  Neurological:     Mental Status: She is alert and oriented to person, place, and time.  Psychiatric:        Behavior: Behavior normal.        Thought Content: Thought content normal.        Judgment: Judgment normal.      Lab Results  Component Value Date   WBC 4.1 04/15/2019   HGB 10.8 (L) 04/15/2019   HCT 34.4 (L) 04/15/2019   MCV 99.4 04/15/2019   PLT 138 (L) 04/15/2019   Lab Results  Component Value Date   FERRITIN 163 07/16/2012   IRON 83 07/16/2012   TIBC 379 07/16/2012   UIBC 296 07/16/2012   IRONPCTSAT 22 07/16/2012   Lab Results  Component Value Date   RETICCTPCT 2.1 07/16/2012   RBC 3.46 (L) 04/15/2019   RETICCTABS 83.6 07/16/2012   No results found for: KPAFRELGTCHN, LAMBDASER, KAPLAMBRATIO No results found for: IGGSERUM, IGA, IGMSERUM No results found for: Odetta Pink, SPEI   Chemistry      Component Value Date/Time   NA 140 04/15/2019 1201   NA 142 04/01/2019 1023   NA 141 12/07/2016 1300   NA 143 03/18/2016 0944   K 4.1 04/15/2019 1201   K 4.7 12/07/2016 1300   K 4.9 03/18/2016 0944   CL 104 04/15/2019 1201   CL 103 12/07/2016 1300   CO2 29 04/15/2019 1201  CO2 29 12/07/2016 1300   CO2 27 03/18/2016 0944   BUN 35 (H) 04/15/2019 1201   BUN 35 (H) 04/01/2019 1023   BUN 45 (H) 12/07/2016 1300   BUN 29.0 (H) 03/18/2016 0944   CREATININE 2.23 (H) 04/15/2019 1201   CREATININE 2.6 (H) 12/07/2016 1300   CREATININE 2.1 (H) 03/18/2016 0944      Component Value Date/Time   CALCIUM 10.2 04/15/2019 1201   CALCIUM 9.9 12/07/2016 1300   CALCIUM 9.3 03/18/2016 0944   ALKPHOS 145 (H) 04/15/2019 1201   ALKPHOS 127 (H) 12/07/2016 1300   ALKPHOS 147 03/18/2016 0944   AST 22 04/15/2019 1201   AST 22 03/18/2016 0944   ALT 11 04/15/2019 1201   ALT 29 12/07/2016 1300   ALT 19 03/18/2016 0944   BILITOT 1.0 04/15/2019 1201   BILITOT 0.32 03/18/2016 0944       Impression and Plan: Ms. Fait is a very pleasant 68 yo African American female with history of recurrent thromboembolic disease on lifelong anticoagulation with Eliquis. She has done well with this and has no complaints.   We will have to see how things go with her kidneys.  She is being followed by Dr. Moshe Cipro.  She also is being followed by cardiology for her heart.  We will see what her erythropoietin level is.  I will plan to get her back to see [place] in another 4 months.    Volanda Napoleon, MD 11/30/20201:02 PM

## 2019-04-16 LAB — ERYTHROPOIETIN: Erythropoietin: 23.9 m[IU]/mL — ABNORMAL HIGH (ref 2.6–18.5)

## 2019-04-17 DIAGNOSIS — M25512 Pain in left shoulder: Secondary | ICD-10-CM | POA: Diagnosis not present

## 2019-04-19 DIAGNOSIS — M25512 Pain in left shoulder: Secondary | ICD-10-CM | POA: Diagnosis not present

## 2019-04-24 DIAGNOSIS — M25512 Pain in left shoulder: Secondary | ICD-10-CM | POA: Diagnosis not present

## 2019-04-26 DIAGNOSIS — M25512 Pain in left shoulder: Secondary | ICD-10-CM | POA: Diagnosis not present

## 2019-04-29 DIAGNOSIS — M109 Gout, unspecified: Secondary | ICD-10-CM | POA: Diagnosis not present

## 2019-04-29 DIAGNOSIS — N183 Chronic kidney disease, stage 3 unspecified: Secondary | ICD-10-CM | POA: Diagnosis not present

## 2019-04-29 DIAGNOSIS — E1122 Type 2 diabetes mellitus with diabetic chronic kidney disease: Secondary | ICD-10-CM | POA: Diagnosis not present

## 2019-04-29 DIAGNOSIS — Z6838 Body mass index (BMI) 38.0-38.9, adult: Secondary | ICD-10-CM | POA: Diagnosis not present

## 2019-04-29 DIAGNOSIS — I129 Hypertensive chronic kidney disease with stage 1 through stage 4 chronic kidney disease, or unspecified chronic kidney disease: Secondary | ICD-10-CM | POA: Diagnosis not present

## 2019-05-02 DIAGNOSIS — M25512 Pain in left shoulder: Secondary | ICD-10-CM | POA: Diagnosis not present

## 2019-05-07 DIAGNOSIS — M25512 Pain in left shoulder: Secondary | ICD-10-CM | POA: Diagnosis not present

## 2019-05-08 ENCOUNTER — Other Ambulatory Visit: Payer: Self-pay | Admitting: Hematology & Oncology

## 2019-05-08 DIAGNOSIS — I2699 Other pulmonary embolism without acute cor pulmonale: Secondary | ICD-10-CM

## 2019-05-13 DIAGNOSIS — M25512 Pain in left shoulder: Secondary | ICD-10-CM | POA: Diagnosis not present

## 2019-05-14 ENCOUNTER — Other Ambulatory Visit: Payer: Self-pay | Admitting: Cardiovascular Disease

## 2019-05-15 NOTE — Telephone Encounter (Signed)
Rx(s) sent to pharmacy electronically.  

## 2019-05-16 DIAGNOSIS — M25512 Pain in left shoulder: Secondary | ICD-10-CM | POA: Diagnosis not present

## 2019-05-17 ENCOUNTER — Other Ambulatory Visit: Payer: Self-pay | Admitting: Cardiovascular Disease

## 2019-05-20 ENCOUNTER — Ambulatory Visit (INDEPENDENT_AMBULATORY_CARE_PROVIDER_SITE_OTHER): Payer: Medicare HMO

## 2019-05-20 DIAGNOSIS — Z95810 Presence of automatic (implantable) cardiac defibrillator: Secondary | ICD-10-CM | POA: Diagnosis not present

## 2019-05-20 DIAGNOSIS — I5042 Chronic combined systolic (congestive) and diastolic (congestive) heart failure: Secondary | ICD-10-CM

## 2019-05-24 NOTE — Progress Notes (Signed)
EPIC Encounter for ICM Monitoring  Patient Name: Ximenna Fonseca is a 69 y.o. female Date: 05/24/2019 Primary Care Physican: Antony Contras, MD Primary Cardiologist:Croitoru Electrophysiologist:Croitoru Nephrologist:[place] 04/10/2019 Weight: 202 lbs   Spoke with patient and she said she is doing well.  She has been working hard to stay on low salt diet.  OptivolThoracic impedancenormal.  Prescribed:Furosemide40 mgtake 2 tablets (80 mg)by mouth daily.  Labs: 04/01/2019 Creatinine 2.37, BUN 35, Potassium 3.7, Sodium 142, GFR 20-23      01/17/2019 Creatinine 2.44, BUN 32, Potassium 4.2, Sodium 140, GFR 20-23 01/09/2019 Creatinine 2.47, BUN 34, Potassium 3.8, Sodium 141, GFR 19-22 07/05/2018 Creatinine 2.83, BUN 54, Potassium 4.3, Sodium 140, GFR 16-19  Recommendations: No changes and encouraged to call if experiencing any fluid symptoms.  Follow-up plan: ICM clinic phone appointment on 06/24/2019.   91 day device clinic remote transmission 07/07/2018.   Copy of ICM check sent to Dr. Sallyanne Kuster.   3 month ICM trend: 05/20/2019    1 Year ICM trend:       Rosalene Billings, RN 05/24/2019 2:48 PM

## 2019-05-29 DIAGNOSIS — M75122 Complete rotator cuff tear or rupture of left shoulder, not specified as traumatic: Secondary | ICD-10-CM | POA: Diagnosis not present

## 2019-06-08 DIAGNOSIS — Z1159 Encounter for screening for other viral diseases: Secondary | ICD-10-CM | POA: Diagnosis not present

## 2019-06-08 DIAGNOSIS — Z1152 Encounter for screening for COVID-19: Secondary | ICD-10-CM | POA: Diagnosis not present

## 2019-06-20 ENCOUNTER — Other Ambulatory Visit: Payer: Self-pay | Admitting: Cardiovascular Disease

## 2019-06-24 ENCOUNTER — Ambulatory Visit (INDEPENDENT_AMBULATORY_CARE_PROVIDER_SITE_OTHER): Payer: Medicare HMO

## 2019-06-24 DIAGNOSIS — Z95810 Presence of automatic (implantable) cardiac defibrillator: Secondary | ICD-10-CM | POA: Diagnosis not present

## 2019-06-24 DIAGNOSIS — I5042 Chronic combined systolic (congestive) and diastolic (congestive) heart failure: Secondary | ICD-10-CM | POA: Diagnosis not present

## 2019-06-26 NOTE — Progress Notes (Signed)
Great.  Thanks

## 2019-06-26 NOTE — Progress Notes (Signed)
EPIC Encounter for ICM Monitoring  Patient Name: Patricia Morgan is a 69 y.o. female Date: 06/26/2019 Primary Care Physican: Antony Contras, MD Primary Cardiologist:Croitoru Electrophysiologist:Croitoru Nephrologist:[place] 06/26/2019 Weight: 200-205lbs   Spoke with patient and she is feeling well at this time.  OptivolThoracic impedanceslightly below baseline normal.  Prescribed:Furosemide40 mgtake 2tablets(80 mg)by mouth daily.  Per patient, Dr Moshe Cipro has instructed her to take extra 40 mg if she has lower extremity swelling.  Labs: 04/15/2019 Creatinine 2.23, BUN 35, Potassium 4., Sodium 140, GFR 22-25 04/01/2019 Creatinine 2.37, BUN 35, Potassium 3.7, Sodium 142, GFR 20-23 01/17/2019 Creatinine 2.44, BUN 32, Potassium 4.2, Sodium 140, GFR 20-23 01/09/2019 Creatinine 2.47, BUN 34, Potassium 3.8, Sodium 141, GFR 19-22 07/05/2018 Creatinine 2.83, BUN 54, Potassium 4.3, Sodium 140, GFR 16-19  Recommendations:  No changes and encouraged to call if experiencing any fluid symptoms.  Follow-up plan: ICM clinic phone appointment on3/15/2021. 91 day device clinic remote transmission 07/07/2018.  Office visit with Dr Sallyanne Kuster 09/23/2019.  Copy of ICM check sent to Dr.Croitoru.   3 month ICM trend: 06/24/2019    1 Year ICM trend:       Rosalene Billings, RN 06/26/2019 8:54 AM

## 2019-06-28 DIAGNOSIS — G4733 Obstructive sleep apnea (adult) (pediatric): Secondary | ICD-10-CM | POA: Diagnosis not present

## 2019-07-08 ENCOUNTER — Ambulatory Visit (INDEPENDENT_AMBULATORY_CARE_PROVIDER_SITE_OTHER): Payer: Medicare HMO | Admitting: *Deleted

## 2019-07-08 DIAGNOSIS — I5042 Chronic combined systolic (congestive) and diastolic (congestive) heart failure: Secondary | ICD-10-CM

## 2019-07-08 LAB — CUP PACEART REMOTE DEVICE CHECK
Battery Remaining Longevity: 131 mo
Battery Voltage: 3.03 V
Brady Statistic RV Percent Paced: 0.01 %
Date Time Interrogation Session: 20210222093326
HighPow Impedance: 57 Ohm
Implantable Lead Implant Date: 20191030
Implantable Lead Location: 753860
Implantable Pulse Generator Implant Date: 20191030
Lead Channel Impedance Value: 304 Ohm
Lead Channel Impedance Value: 361 Ohm
Lead Channel Pacing Threshold Amplitude: 0.75 V
Lead Channel Pacing Threshold Pulse Width: 0.4 ms
Lead Channel Sensing Intrinsic Amplitude: 17.5 mV
Lead Channel Sensing Intrinsic Amplitude: 17.5 mV
Lead Channel Setting Pacing Amplitude: 2 V
Lead Channel Setting Pacing Pulse Width: 0.4 ms
Lead Channel Setting Sensing Sensitivity: 0.3 mV

## 2019-07-09 DIAGNOSIS — I272 Pulmonary hypertension, unspecified: Secondary | ICD-10-CM | POA: Diagnosis not present

## 2019-07-09 DIAGNOSIS — Z853 Personal history of malignant neoplasm of breast: Secondary | ICD-10-CM | POA: Diagnosis not present

## 2019-07-09 DIAGNOSIS — I10 Essential (primary) hypertension: Secondary | ICD-10-CM | POA: Diagnosis not present

## 2019-07-09 DIAGNOSIS — C50911 Malignant neoplasm of unspecified site of right female breast: Secondary | ICD-10-CM | POA: Diagnosis not present

## 2019-07-09 DIAGNOSIS — I129 Hypertensive chronic kidney disease with stage 1 through stage 4 chronic kidney disease, or unspecified chronic kidney disease: Secondary | ICD-10-CM | POA: Diagnosis not present

## 2019-07-09 DIAGNOSIS — E7800 Pure hypercholesterolemia, unspecified: Secondary | ICD-10-CM | POA: Diagnosis not present

## 2019-07-09 DIAGNOSIS — N184 Chronic kidney disease, stage 4 (severe): Secondary | ICD-10-CM | POA: Diagnosis not present

## 2019-07-09 DIAGNOSIS — I5020 Unspecified systolic (congestive) heart failure: Secondary | ICD-10-CM | POA: Diagnosis not present

## 2019-07-09 DIAGNOSIS — E1122 Type 2 diabetes mellitus with diabetic chronic kidney disease: Secondary | ICD-10-CM | POA: Diagnosis not present

## 2019-07-09 NOTE — Progress Notes (Signed)
ICD Remote  

## 2019-07-15 ENCOUNTER — Other Ambulatory Visit: Payer: Self-pay | Admitting: Cardiovascular Disease

## 2019-07-29 ENCOUNTER — Ambulatory Visit (INDEPENDENT_AMBULATORY_CARE_PROVIDER_SITE_OTHER): Payer: Medicare HMO

## 2019-07-29 DIAGNOSIS — Z95810 Presence of automatic (implantable) cardiac defibrillator: Secondary | ICD-10-CM | POA: Diagnosis not present

## 2019-07-29 DIAGNOSIS — I5042 Chronic combined systolic (congestive) and diastolic (congestive) heart failure: Secondary | ICD-10-CM

## 2019-07-30 NOTE — Progress Notes (Signed)
EPIC Encounter for ICM Monitoring  Patient Name: Patricia Morgan is a 69 y.o. female Date: 07/30/2019 Primary Care Physican: Patricia Contras, MD Primary Cardiologist:Patricia Morgan Electrophysiologist:Patricia Morgan Nephrologist:Patricia Morgan 07/30/2019 Weight: 200-205lbs   Spoke with patient and she is feeling well at this time. Patient has questions about the 3/29 with Patricia Kilts, PA to discuss Barostim and provided general information but recommended she discuss with Patricia Morgan.   OptivolThoracic impedanceslightly below baseline normal.  Prescribed:Furosemide40 mgtake 2tablets(80 mg)by mouth daily.  Per patient, Patricia Morgan has instructed her to take extra 40 mg if she has lower extremity swelling.  Labs: 04/15/2019 Creatinine 2.23, BUN 35, Potassium 4., Sodium 140, GFR 22-25 04/01/2019 Creatinine 2.37, BUN 35, Potassium 3.7, Sodium 142, GFR 20-23 01/17/2019 Creatinine 2.44, BUN 32, Potassium 4.2, Sodium 140, GFR 20-23 01/09/2019 Creatinine 2.47, BUN 34, Potassium 3.8, Sodium 141, GFR 19-22 07/05/2018 Creatinine 2.83, BUN 54, Potassium 4.3, Sodium 140, GFR 16-19  Recommendations:  No changes and encouraged to call if experiencing any fluid symptoms.  Follow-up plan: ICM clinic phone appointment on4/19/2021. 91 day device clinic remote transmission 10/07/2018.  Office visit with Patricia Morgan, 08/12/2019 and with Patricia Sallyanne Kuster 09/23/2019.  Copy of ICM check sent to Patricia.Croitoru.  3 month ICM trend: 07/29/2019    1 Year ICM trend:       Patricia Billings, RN 07/30/2019 11:55 AM

## 2019-07-31 NOTE — Progress Notes (Signed)
Thanks

## 2019-08-02 IMAGING — CR DG CHEST 2V
2 series · 2 of 2 positions shown · non-contrast
Comparison: 01/05/2018

CLINICAL DATA: Status post ICD placement.

EXAM:
CHEST - 2 VIEW

[chest lat]
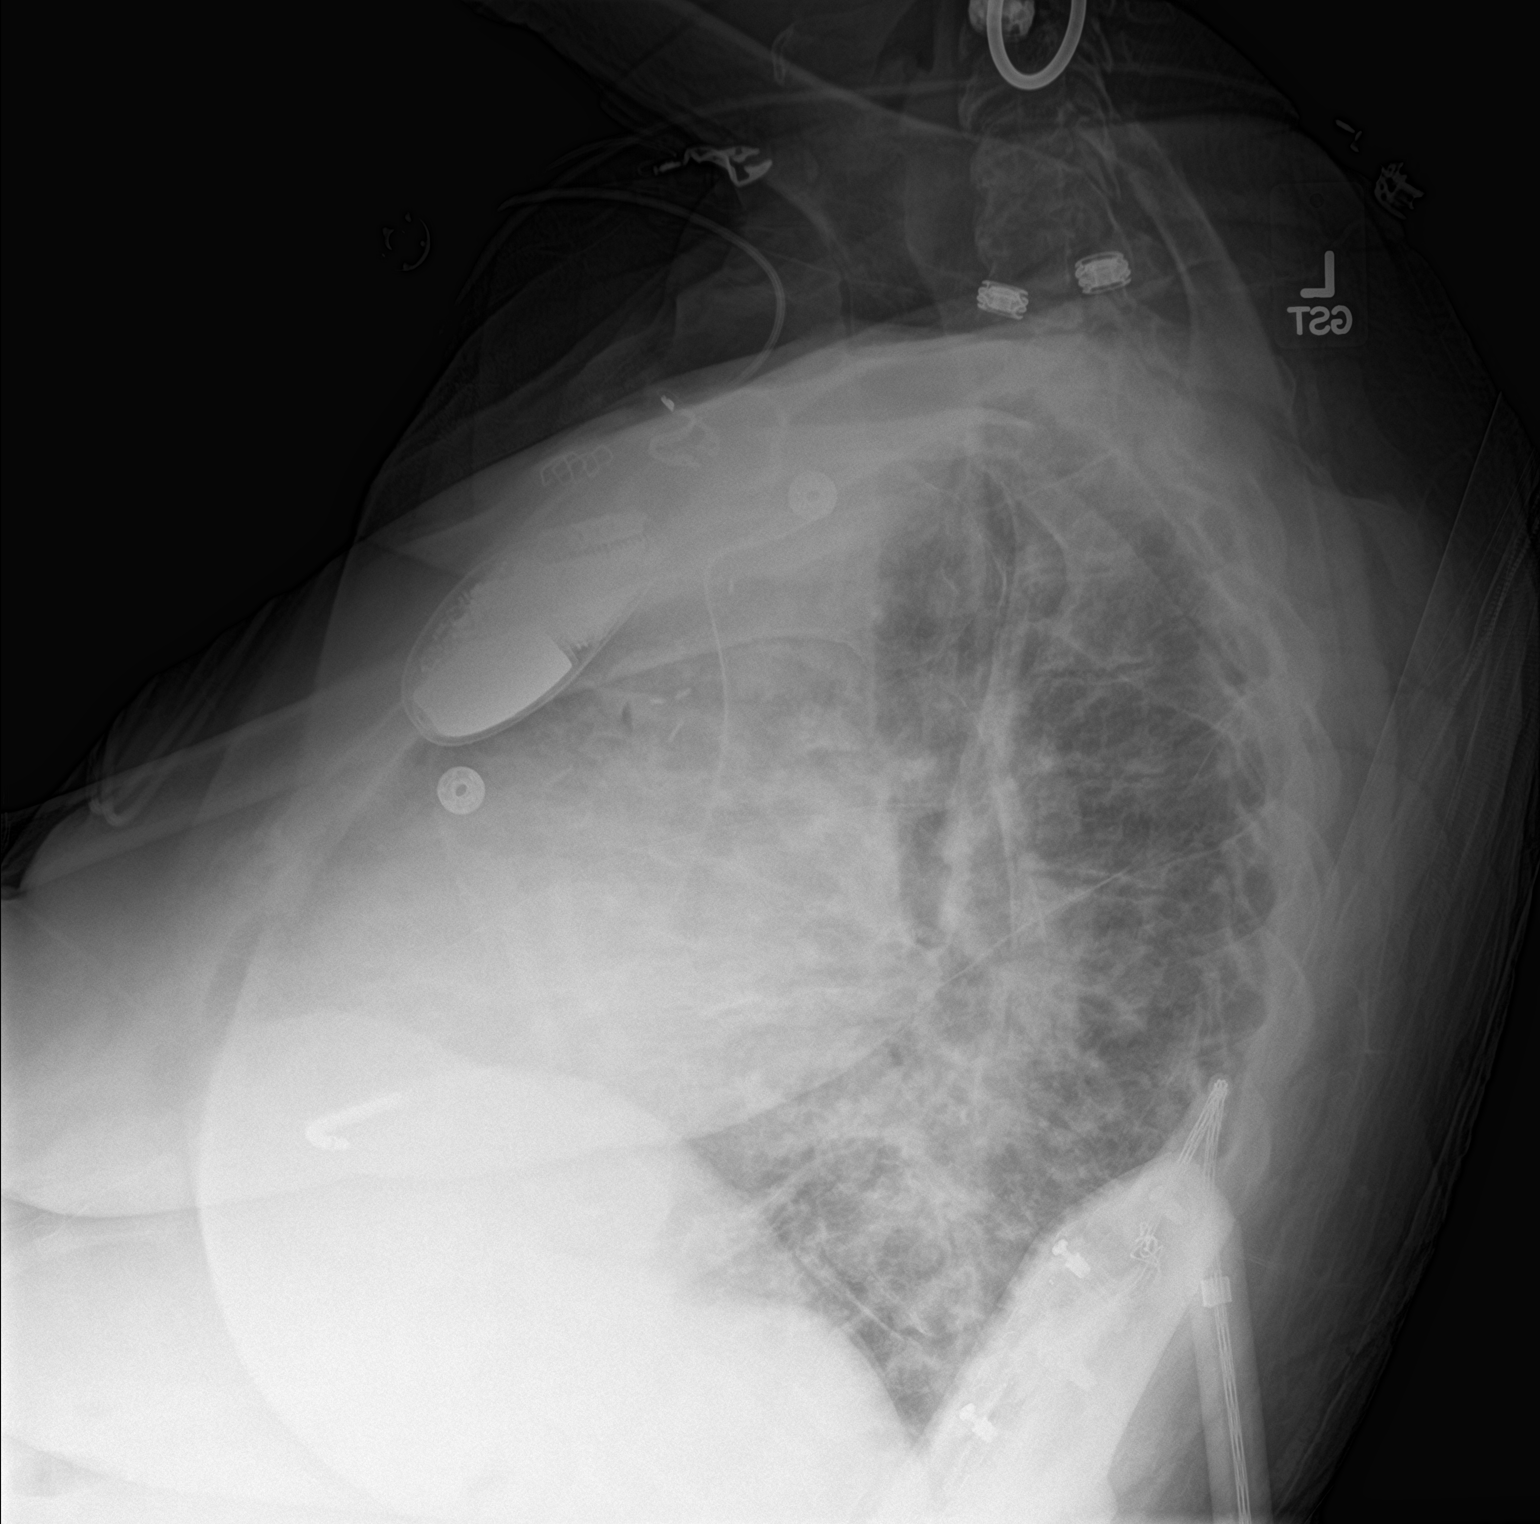

[chest ap]
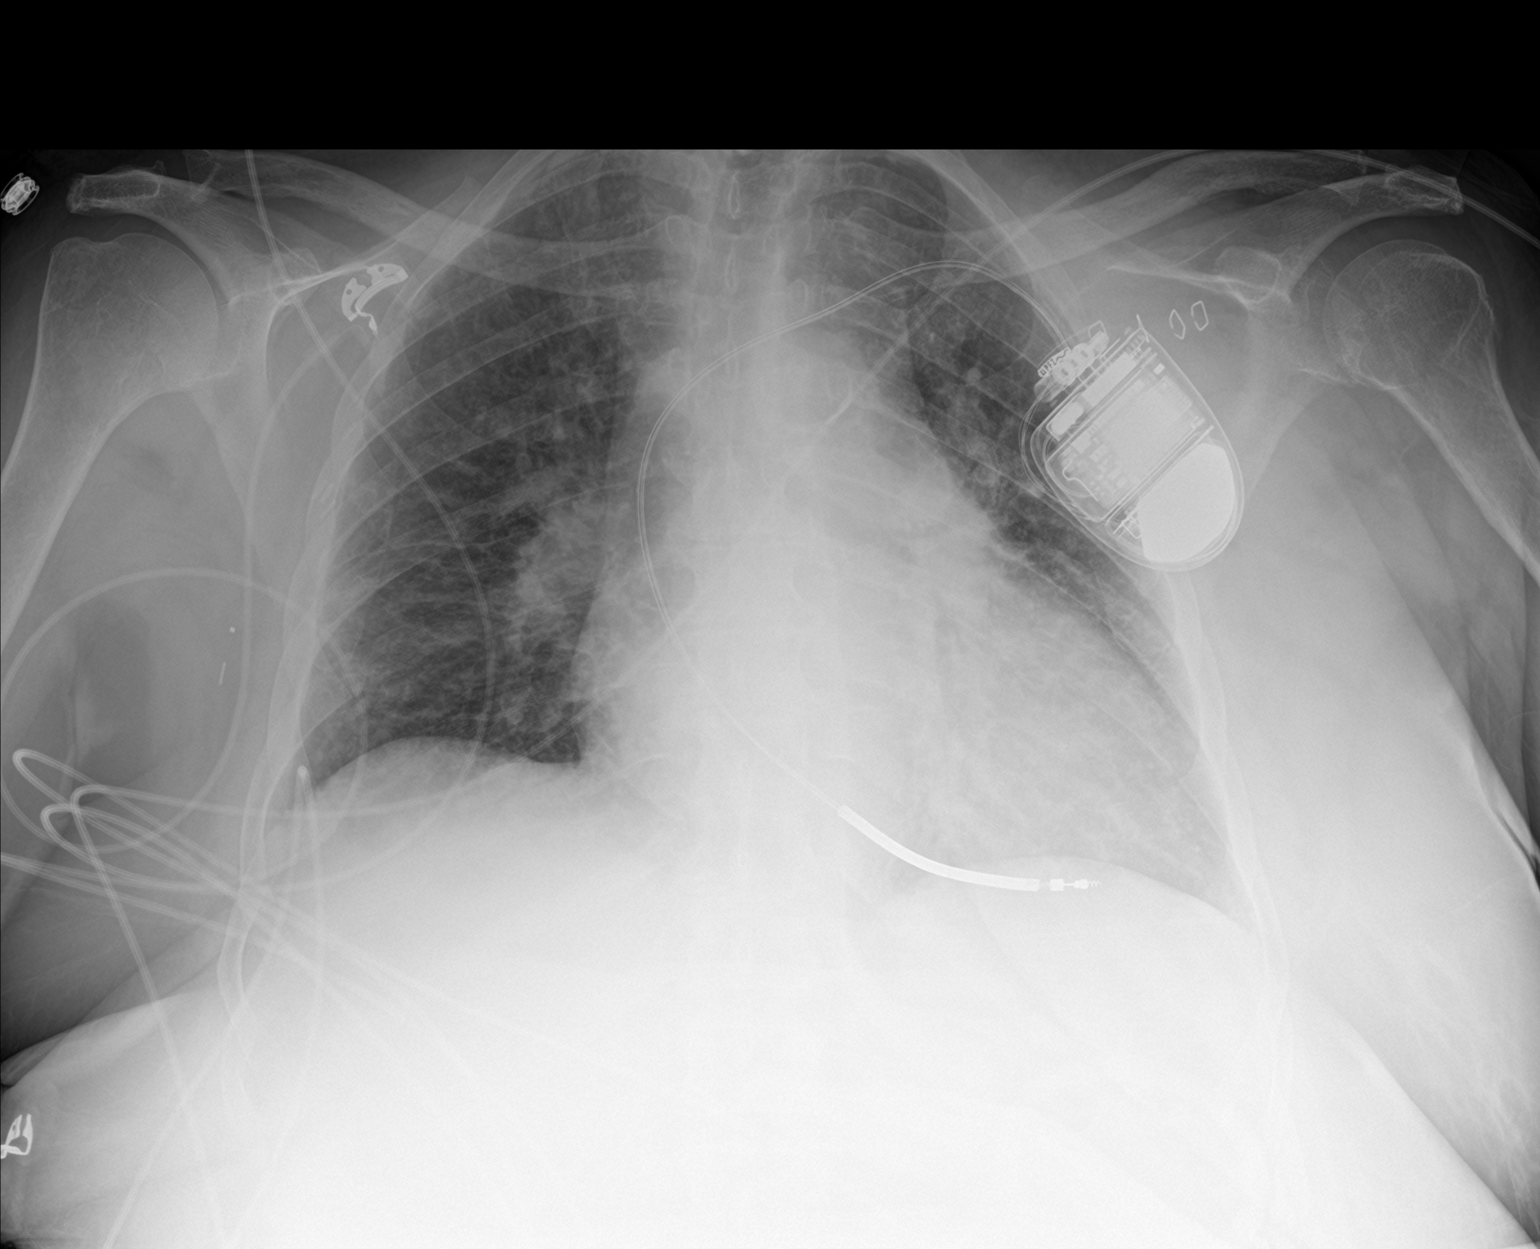

[2 of 2 positions shown; findings below may reference images not displayed]

FINDINGS: Left anterior chest wall are ICD has its single lead projecting in
the right ventricle.

Cardiac silhouette is mildly enlarged. No mediastinal or hilar
masses.

There are stable prominent bronchovascular markings. No evidence of
pneumonia or pulmonary edema. No pleural effusion.

No pneumothorax.

Stable changes from prior right breast surgery.
IMPRESSION: 1. Well-positioned single lead left anterior chest wall ICD.
2. No pneumothorax.  No acute cardiopulmonary disease.

## 2019-08-09 NOTE — Progress Notes (Signed)
Electrophysiology Office Note Date: 08/12/2019  ID:  Patricia Morgan, DOB 1951-02-27, MRN [id number]  PCP: Antony Contras, MD Primary Cardiologist: Sanda Klein, MD Electrophysiologist: Dr. Sallyanne Kuster  CC: Routine ICD follow-up  Patricia Morgan is a 69 y.o. female seen today for barostim consideration. Her ICD is followed by Dr. Sallyanne Kuster.  They present today for routine electrophysiology followup.  Since last being seen in our clinic, the patient reports doing very well. They deny chest pain, palpitations,  PND, orthopnea, nausea, vomiting, dizziness, syncope, weight gain, or early satiety.  She has not had ICD shocks.  She has intermittent peripheral edema that is overall well managed on her current diuretics. She gets SOB walking around a grocery store or up stairs. Generally does her ADLs without difficulty, but does at times have SOB with these.   Device History: Medtronic Single Chamber ICD implanted 2019 for primary prevention History of appropriate therapy: No History of AAD therapy: No   Past Medical History:  Diagnosis Date  . ANEMIA-NOS   . BREAST CANCER, HX OF 1994 & 1995   1994>>recurrence in 1995, R breast, s/p lumpectomy>>mastectomy  . Cancer ([place])   . Cardiomyopathy, nonischemic ([place])   . CHF (congestive heart failure) ([facility])    chronic systolic and diastolic CHF  . CHRONIC KIDNEY DISEASE STAGE III (MODERATE)   . Clotting disorder ([place])    on eliquis for Factor 5 disorder  . Colon polyps   . DIABETES MELLITUS, TYPE II   . Diverticulosis   . HYPERLIPIDEMIA   . HYPERTENSION   . ILD (interstitial lung disease) ([place])    10/2018 CT findings suggestive of sarcoidosis; followed by Dr. Elsworth Soho  . OBSTRUCTIVE SLEEP APNEA    CPAP  . Personal history of chemotherapy   . Proteinuria   . PULMONARY EMBOLISM 09/24/2008   anticoag thru 03/2010  . Rotator cuff tear    Left  . Shoulder impingement, left   . Sleep apnea    Wears CPAP nightly   Past Surgical History:   Procedure Laterality Date  . BREAST SURGERY    . CARDIAC CATHETERIZATION N/A 11/02/2015   Procedure: Right/Left Heart Cath and Coronary Angiography;  Surgeon: Burnell Blanks, MD;  Location: [facility];  Service: Cardiovascular;  Laterality: N/A;  . DILATION AND CURETTAGE OF UTERUS    . ICD IMPLANT N/A 03/14/2018   Procedure: ICD IMPLANT;  Surgeon: Sanda Klein, MD;  Location: [facility];  Service: Cardiovascular;  Laterality: N/A;  . insertion port a cath    . MASTECTOMY Right 1995   Right  . PORT-A-CATH REMOVAL    . SHOULDER ARTHROSCOPY WITH ROTATOR CUFF REPAIR AND SUBACROMIAL DECOMPRESSION Left 01/24/2019   Procedure: SHOULDER ARTHROSCOPY WITH ROTATOR CUFF REPAIR AND SUBACROMIAL DECOMPRESSION;  Surgeon: Nicholes Stairs, MD;  Location: [facility];  Service: Orthopedics;  Laterality: Left;  2.5 hrs    Current Outpatient Medications  Medication Sig Dispense Refill  . acetaminophen (TYLENOL) 500 MG tablet Take 500 mg by mouth every 6 (six) hours as needed for mild pain.    [name] allopurinol (ZYLOPRIM) 100 MG tablet Take 100 mg by mouth 2 (two) times daily.     . carvedilol (COREG) 25 MG tablet Take 1 tablet (25 mg total) by mouth 2 (two) times daily with a meal. 180 tablet 3  . Cholecalciferol (VITAMIN D-3) 5000 units TABS Take 5,000 Units by mouth daily.     . colchicine 0.6 MG tablet Take 0.6 mg by mouth daily.  1  . diphenhydrAMINE (BENADRYL) 25 MG tablet Take 25 mg by mouth daily.    [name] ELIQUIS 5 MG TABS tablet TAKE 1 TABLET TWICE DAILY 180 tablet 6  . ferrous sulfate 325 (65 FE) MG tablet Take 325 mg by mouth daily with breakfast.    . furosemide (LASIX) 40 MG tablet Take 2 tablets (80 mg total) by mouth daily. 30 tablet 11  . hydrALAZINE (APRESOLINE) 10 MG tablet TAKE 1 TABLET THREE TIMES DAILY 270 tablet 3  . isosorbide mononitrate (IMDUR) 30 MG 24 hr tablet TAKE 1 TABLET EVERY DAY 90 tablet 3  . rosuvastatin (CRESTOR) 10 MG tablet TAKE 1 TABLET EVERY DAY 90  tablet 3  . TURMERIC PO Take 1,000 mg by mouth daily.    . vitamin B-12 (CYANOCOBALAMIN) 1000 MCG tablet Take 1,000 mcg by mouth daily.    . vitamin E 400 UNIT capsule Take 400 Units by mouth daily.     No current facility-administered medications for this visit.    Allergies:   Patient has no known allergies.   Social History: Social History   Socioeconomic History  . Marital status: Divorced    Spouse name: Not on file  . Number of children: 2  . Years of education: 93  . Highest education level: Not on file  Occupational History  . Occupation: retired  Tobacco Use  . Smoking status: Never Smoker  . Smokeless tobacco: Never Used  . Tobacco comment: Lives alone-divorced  Substance and Sexual Activity  . Alcohol use: Yes    Alcohol/week: 0.0 standard drinks    Comment: occ  . Drug use: No  . Sexual activity: Not on file  Other Topics Concern  . Not on file  Social History Narrative  . Not on file   Social Determinants of Health   Financial Resource Strain:   . Difficulty of Paying Living Expenses:   Food Insecurity:   . Worried About Charity fundraiser in the Last Year:   . Arboriculturist in the Last Year:   Transportation Needs:   . Film/video editor (Medical):   [name] Lack of Transportation (Non-Medical):   Physical Activity:   . Days of Exercise per Week:   . Minutes of Exercise per Session:   Stress:   . Feeling of Stress :   Social Connections:   . Frequency of Communication with Friends and Family:   . Frequency of Social Gatherings with Friends and Family:   . Attends Religious Services:   . Active Member of Clubs or Organizations:   . Attends Archivist Meetings:   [name] Marital Status:   Intimate Partner Violence:   . Fear of Current or Ex-Partner:   . Emotionally Abused:   [name] Physically Abused:   . Sexually Abused:     Family History: Family History  Problem Relation Age of Onset  . Parkinson's disease Mother   . Asthma Father    . Diabetes Sister   . Colon cancer Neg Hx   . Stomach cancer Neg Hx   . Esophageal cancer Neg Hx   . Rectal cancer Neg Hx   . Liver cancer Neg Hx     Review of Systems: All other systems reviewed and are otherwise negative except as noted above.   Physical Exam: Vitals:   08/12/19 1023  BP: 118/72  Pulse: 71  SpO2: 97%  Weight: 208 lb 6.4 oz (94.5 kg)  Height: 5\' 1"  (1.549 m)  GEN- The patient is well appearing, alert and oriented x 3 today.   HEENT: normocephalic, atraumatic; sclera clear, conjunctiva pink; hearing intact; oropharynx clear; neck supple, no JVP Lymph- no cervical lymphadenopathy Lungs- Clear to ausculation bilaterally, normal work of breathing.  No wheezes, rales, rhonchi Heart- Regular rate and rhythm, no murmurs, rubs or gallops, PMI not laterally displaced GI- soft, non-tender, non-distended, bowel sounds present, no hepatosplenomegaly Extremities- no clubbing, cyanosis, or edema; DP/PT/radial pulses 2+ bilaterally MS- no significant deformity or atrophy Skin- warm and dry, no rash or lesion; ICD pocket well healed Psych- euthymic mood, full affect Neuro- strength and sensation are intact  ICD interrogation- reviewed in detail today,  See PACEART report  EKG:  EKG is ordered today. The ekg ordered today shows NSR at 73 bpm with QRS of 90 ms  Recent Labs: 04/15/2019: ALT 11; BUN 35; Creatinine 2.23; Hemoglobin 10.8; Platelet Count 138; Potassium 4.1; Sodium 140   Wt Readings from Last 3 Encounters:  08/12/19 208 lb 6.4 oz (94.5 kg)  04/15/19 199 lb (90.3 kg)  04/01/19 204 lb (92.5 kg)     Other studies Reviewed: Additional studies/ records that were reviewed today include: Most recent office notes, previous EP office notes, Previous Echo, previous EKGs, most recent labwork.    Assessment and Plan:  1.  Chronic systolic dysfunction s/p Medtronic single chamber ICD  euvolemic today Stable on an appropriate medical regimen Normal ICD  function See P[facility] Art report No changes today Barostim candidate She is NOT a batwire candidate due to GFR Not on ACE/ARB/ARNi due to chronic renal dysfunction NYHA III symptoms Body mass index is 39.38 kg/m.  QRS 90 ms today.  She is a reasonable candidate for barostim consideration; I worry her body habitus/carotid anatomy could make an approach difficult and may not be suitable. Will await BNP to decide for re: referral for carotid assessment.   2. CKD IV She follows with Dr. Moshe Cipro  3. OSA Wears CPAP nightly  4. HTN Stable on current regimen  Current medicines are reviewed at length with the patient today.   The patient does not have concerns regarding her medicines.  The following changes were made today:  none  Labs/ tests ordered today include:  Orders Placed This Encounter  Procedures  . Basic Metabolic Panel (BMET)  . Pro b natriuretic peptide (BNP)  . CUP PACEART [place]  . EKG 12-Lead    Disposition:   Follow up with Dr. Sallyanne Kuster as scheduled for next month.  Further pending labwork for barostim consideration.   Patricia Lefevre, PA-C  08/12/2019 1:16 PM  [address] [place] [address] [phone] (office) [phone] (fax)

## 2019-08-12 ENCOUNTER — Inpatient Hospital Stay: Payer: Medicare HMO | Attending: Hematology & Oncology

## 2019-08-12 ENCOUNTER — Other Ambulatory Visit: Payer: Self-pay

## 2019-08-12 ENCOUNTER — Inpatient Hospital Stay: Payer: Medicare HMO | Admitting: Hematology & Oncology

## 2019-08-12 ENCOUNTER — Ambulatory Visit (INDEPENDENT_AMBULATORY_CARE_PROVIDER_SITE_OTHER): Payer: Medicare HMO | Admitting: Student

## 2019-08-12 ENCOUNTER — Encounter: Payer: Self-pay | Admitting: Student

## 2019-08-12 VITALS — BP 118/72 | HR 71 | Ht 61.0 in | Wt 208.4 lb

## 2019-08-12 DIAGNOSIS — R9431 Abnormal electrocardiogram [ECG] [EKG]: Secondary | ICD-10-CM

## 2019-08-12 DIAGNOSIS — I5042 Chronic combined systolic (congestive) and diastolic (congestive) heart failure: Secondary | ICD-10-CM

## 2019-08-12 DIAGNOSIS — I428 Other cardiomyopathies: Secondary | ICD-10-CM | POA: Diagnosis not present

## 2019-08-12 DIAGNOSIS — I10 Essential (primary) hypertension: Secondary | ICD-10-CM | POA: Diagnosis not present

## 2019-08-12 LAB — CUP PACEART INCLINIC DEVICE CHECK
Battery Remaining Longevity: 129 mo
Battery Voltage: 3.03 V
Brady Statistic RV Percent Paced: 0.01 %
Date Time Interrogation Session: 20210329121242
HighPow Impedance: 55 Ohm
Implantable Lead Implant Date: 20191030
Implantable Lead Location: 753860
Implantable Pulse Generator Implant Date: 20191030
Lead Channel Impedance Value: 304 Ohm
Lead Channel Impedance Value: 399 Ohm
Lead Channel Pacing Threshold Amplitude: 0.75 V
Lead Channel Pacing Threshold Pulse Width: 0.4 ms
Lead Channel Sensing Intrinsic Amplitude: 16.75 mV
Lead Channel Sensing Intrinsic Amplitude: 18.375 mV
Lead Channel Setting Pacing Amplitude: 2 V
Lead Channel Setting Pacing Pulse Width: 0.4 ms
Lead Channel Setting Sensing Sensitivity: 0.3 mV

## 2019-08-12 LAB — BASIC METABOLIC PANEL
BUN/Creatinine Ratio: 17 (ref 12–28)
BUN: 43 mg/dL — ABNORMAL HIGH (ref 8–27)
CO2: 23 mmol/L (ref 20–29)
Calcium: 9.5 mg/dL (ref 8.7–10.3)
Chloride: 104 mmol/L (ref 96–106)
Creatinine, Ser: 2.56 mg/dL — ABNORMAL HIGH (ref 0.57–1.00)
GFR calc Af Amer: 21 mL/min/{1.73_m2} — ABNORMAL LOW (ref >59)
GFR calc non Af Amer: 18 mL/min/{1.73_m2} — ABNORMAL LOW (ref >59)
Glucose: 127 mg/dL — ABNORMAL HIGH (ref 65–99)
Potassium: 4 mmol/L (ref 3.5–5.2)
Sodium: 141 mmol/L (ref 134–144)

## 2019-08-12 LAB — PRO B NATRIURETIC PEPTIDE: NT-Pro BNP: 7259 pg/mL — ABNORMAL HIGH (ref 0–301)

## 2019-08-12 NOTE — Patient Instructions (Addendum)
Medication Instructions:  none *If you need a refill on your cardiac medications before your next appointment, please call your pharmacy*   Lab Work:  TODAY BMET PRO BNP If you have labs (blood work) drawn today and your tests are completely normal, you will receive your results only by: [name] MyChart Message (if you have MyChart) OR . A paper copy in the mail If you have any lab test that is abnormal or we need to change your treatment, we will call you to review the results.   Testing/Procedures: none   Follow-Up: At [facility], you and your health needs are our priority.  As part of our continuing mission to provide you with exceptional heart care, we have created designated Provider Care Teams.  These Care Teams include your primary Cardiologist (physician) and Advanced Practice Providers (APPs -  Physician Assistants and Nurse Practitioners) who all work together to provide you with the care you need, when you need it.      Other Instructions Remote monitoring is used to monitor your ICD from home. This monitoring reduces the number of office visits required to check your device to one time per year. It allows [place] to keep an eye on the functioning of your device to ensure it is working properly. You are scheduled for a device check from home on 5/24. You may send your transmission at any time that day. If you have a wireless device, the transmission will be sent automatically. After your physician reviews your transmission, you will receive a postcard with your next transmission date.

## 2019-08-13 ENCOUNTER — Telehealth: Payer: Self-pay

## 2019-08-13 NOTE — Telephone Encounter (Signed)
-----   Message from Shirley Friar, PA-C sent at 08/13/2019  8:13 AM EDT ----- Seen to discuss barostim candidacy.  NT-Pro BNP out of range for consideration.   Could potentially be candidate for the future; will forward to Dr. Sallyanne Kuster as an Juluis Rainier. With normal creatinine she would benefit from medication adjustment, but not much room to maneuver currently. (is on low dose hydral)

## 2019-08-13 NOTE — Telephone Encounter (Signed)
The patient has been notified of the lab result and verbalized understanding.  All questions (if any) were answered. Frederik Schmidt, RN 08/13/2019 9:16 AM

## 2019-08-13 NOTE — Telephone Encounter (Signed)
lpmtcb 3/30

## 2019-08-22 IMAGING — CT CT CHEST HIGH RESOLUTION W/O CM
2 of 5 series · 15 of 36 positions shown, 18 images · non-contrast
Comparison: 08/30/2015.

CLINICAL DATA: Persistent interstitial markings on chest x-ray.
Possible interstitial lung disease.

EXAM:
CT CHEST WITHOUT CONTRAST
TECHNIQUE: Multidetector CT imaging of the chest was performed following the
standard protocol without intravenous contrast. High resolution
imaging of the lungs, as well as inspiratory and expiratory imaging,
was performed.

[Series 4: thorax · axial · 0.70mm/px · z∈[-246,-18]mm · 12 of 126 slices shown, 15 images]
[im 6/126  mediastinal]
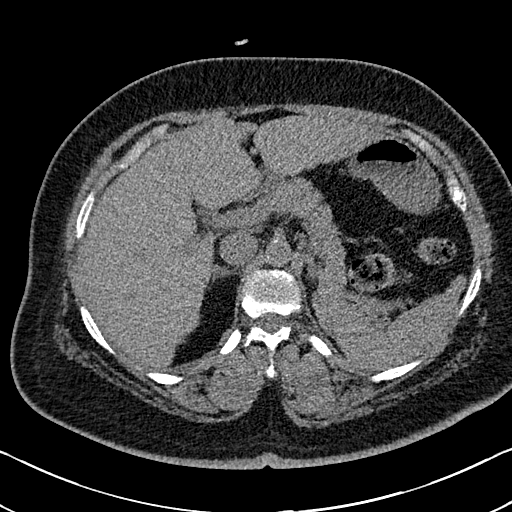
[im 6/126  lung]
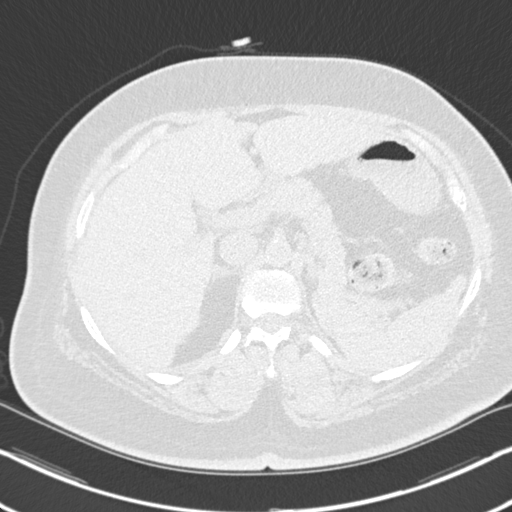
[im 18/126  lung]
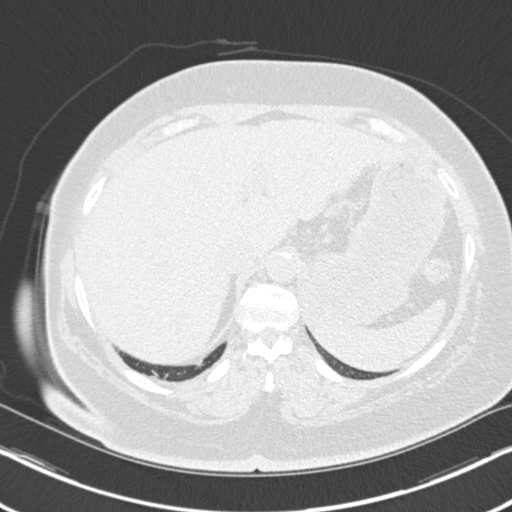
[im 30/126  lung]
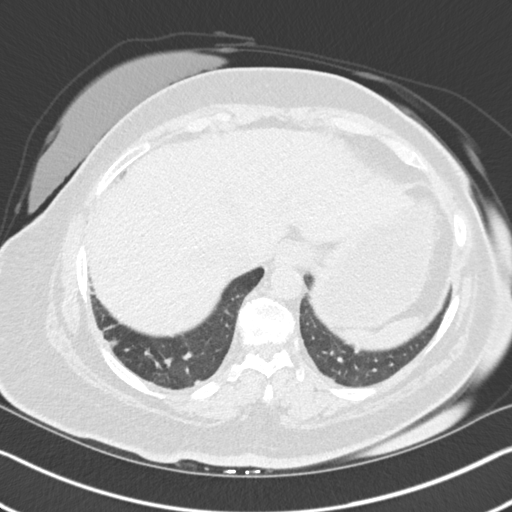
[im 36/126  lung]
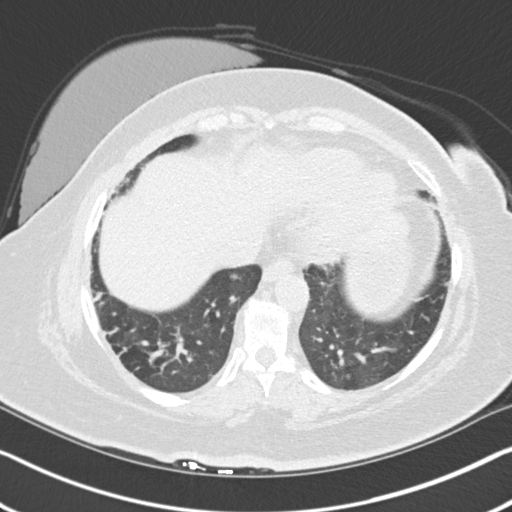
[im 48/126  mediastinal]
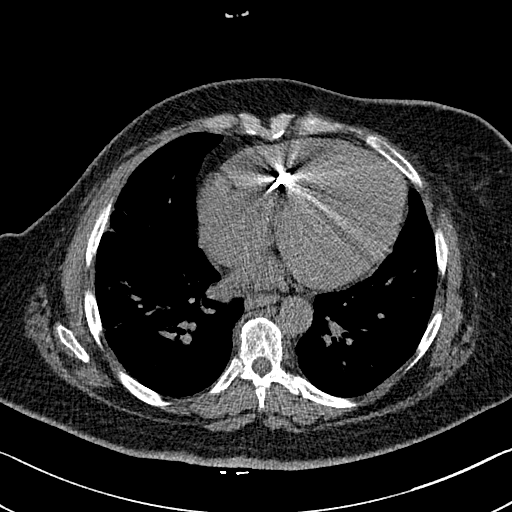
[im 48/126  lung]
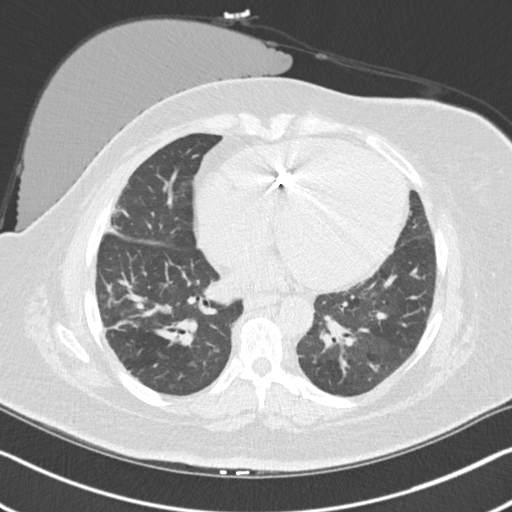
[im 60/126  lung]
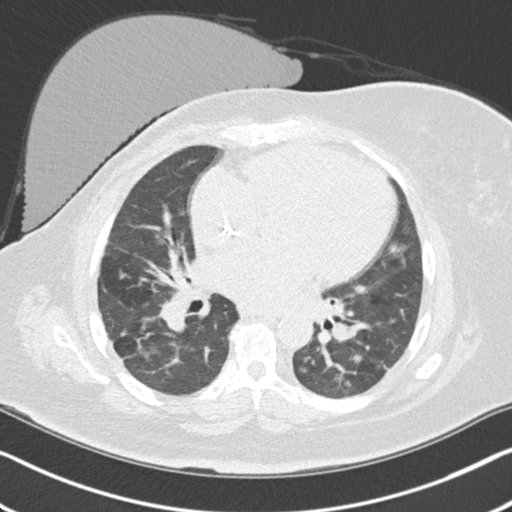
[im 66/126  lung]
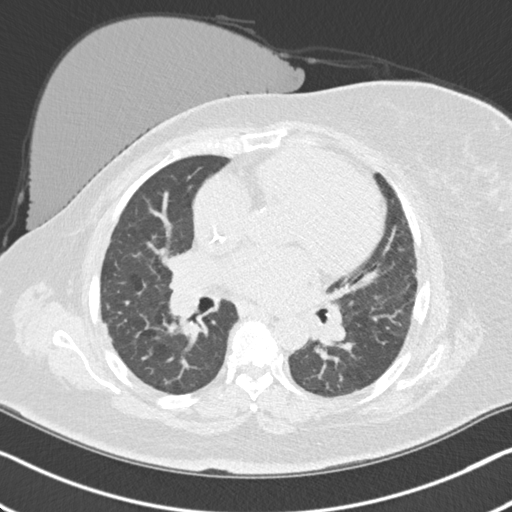
[im 78/126  lung]
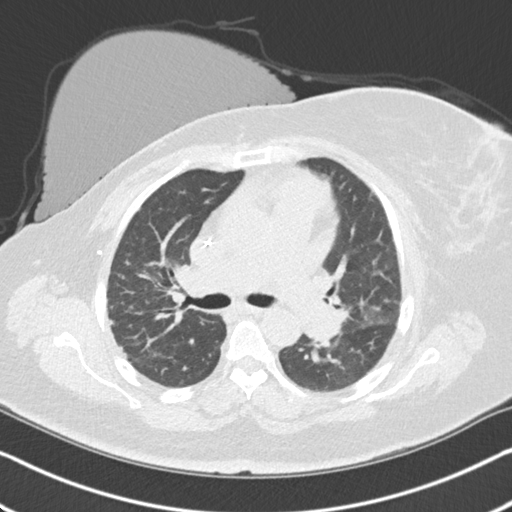
[im 90/126  mediastinal]
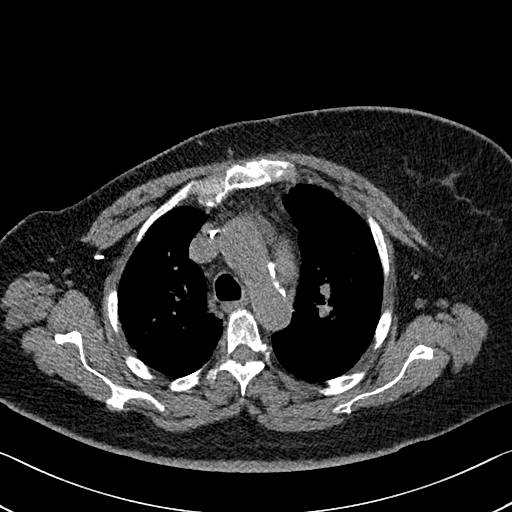
[im 90/126  lung]
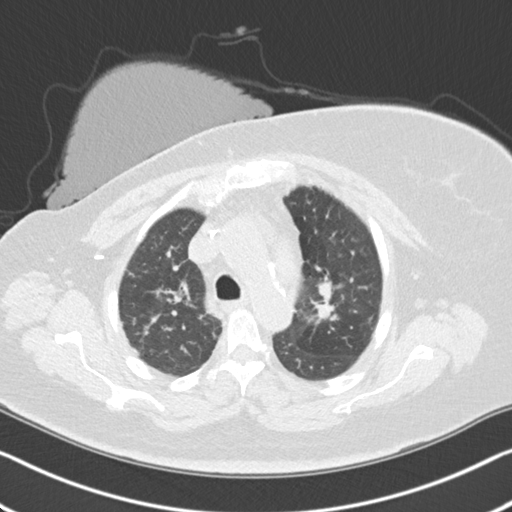
[im 96/126  lung]
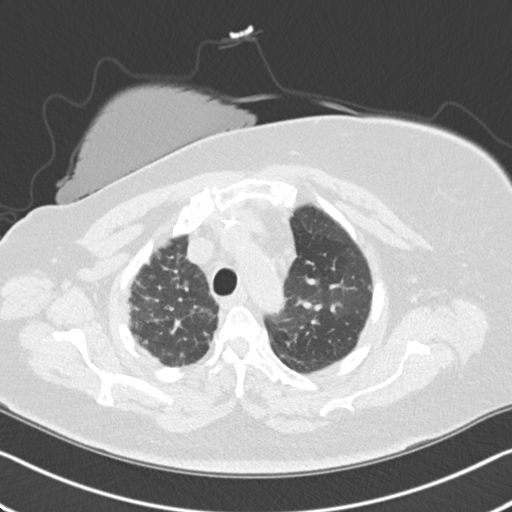
[im 108/126  lung]
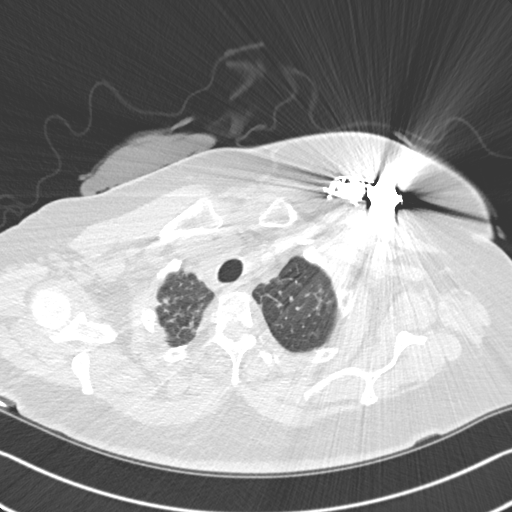
[im 120/126  lung]
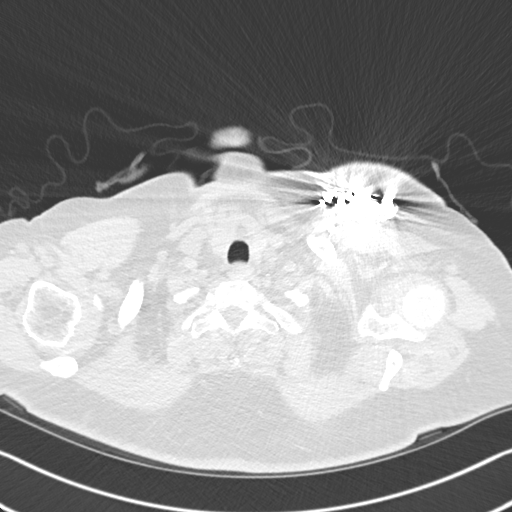

[Series 8: coronal · coronal · 0.52mm/px · 3 of 145 slices shown]
[im 29/145  lung]
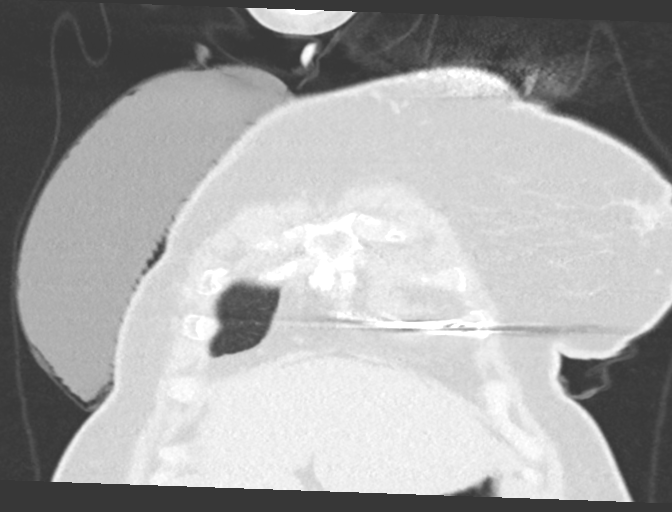
[im 58/145  lung]
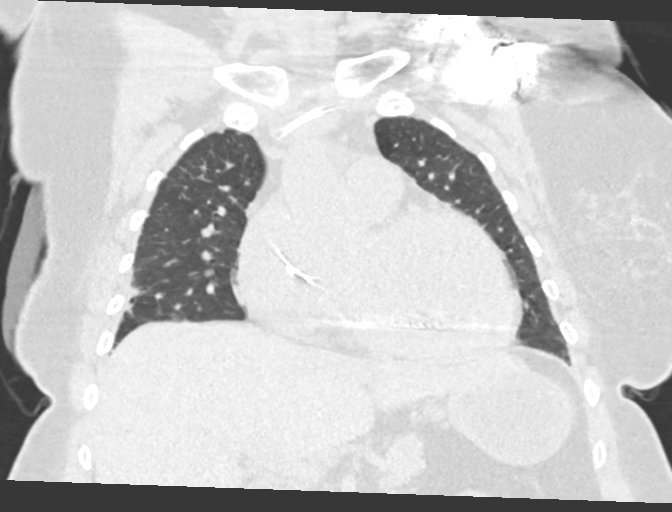
[im 87/145  lung]
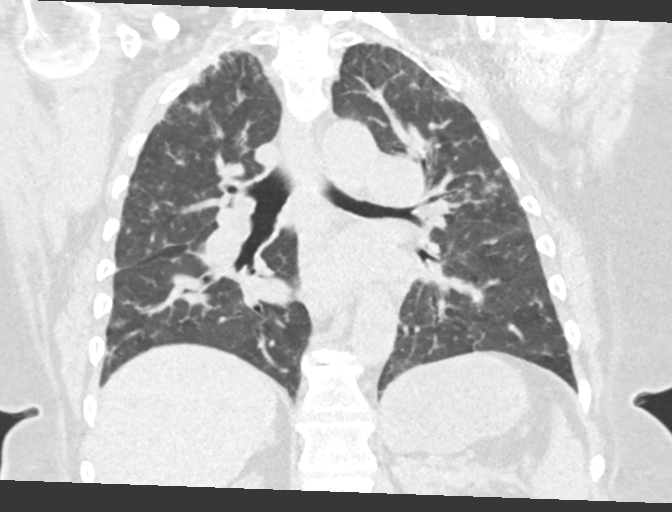

[15 of 36 positions shown; findings below may reference images not displayed]

FINDINGS: Cardiovascular: Atherosclerotic calcification of the arterial
vasculature, including coronary arteries. Pulmonic trunk is
borderline enlarged. Heart is enlarged. No pericardial effusion.

Mediastinum/Nodes: Mediastinal lymph nodes measure up to 12 mm in
the low right paratracheal station, previously 8 mm. Hilar regions
are difficult to evaluate without IV contrast. No axillary
adenopathy. Surgical clips in the right axilla. Esophagus is grossly
unremarkable.

Lungs/Pleura: Interstitial thickening and nodularity appear slightly
more organized when compared with 08/30/2015. Findings are upper and
midlung zone predominant and there may be involvement of the
fissures. No bronchiectasis. Subpleural reticulation is not an
overwhelming feature. No honeycombing or architectural distortion.
There is air trapping. No pleural fluid. Airway is unremarkable.

Upper Abdomen: Liver margin is irregular. Visualized portions of the
adrenal glands, right kidney, spleen, pancreas and stomach are
grossly unremarkable. Gastrohepatic ligament lymph nodes measure up
to 9 mm.

Musculoskeletal: Degenerative changes in the spine. No worrisome
lytic or sclerotic lesions.
IMPRESSION: 1. Pulmonary parenchymal pattern of interstitial lung disease, as
detailed above, appears somewhat more organized than on 08/30/2015.
Together with borderline enlarged/calcified mediastinal/hilar lymph
nodes, sarcoid is questioned. Given air trapping, chronic
hypersensitivity pneumonitis is not excluded.
2. Marginal irregularity the liver is indicative of cirrhosis.
3. Aortic atherosclerosis ([id number]-170.0). Coronary artery
calcification.
4. Enlarged pulmonic trunk, indicative of pulmonary arterial
hypertension.

## 2019-09-02 ENCOUNTER — Ambulatory Visit (INDEPENDENT_AMBULATORY_CARE_PROVIDER_SITE_OTHER): Payer: Medicare Other

## 2019-09-02 DIAGNOSIS — Z95810 Presence of automatic (implantable) cardiac defibrillator: Secondary | ICD-10-CM | POA: Diagnosis not present

## 2019-09-02 DIAGNOSIS — I5042 Chronic combined systolic (congestive) and diastolic (congestive) heart failure: Secondary | ICD-10-CM | POA: Diagnosis not present

## 2019-09-03 ENCOUNTER — Telehealth: Payer: Self-pay

## 2019-09-03 NOTE — Telephone Encounter (Signed)
Remote ICM transmission received.  Attempted call to patient regarding ICM remote transmission and left detailed message per DPR to return call.   

## 2019-09-03 NOTE — Progress Notes (Signed)
EPIC Encounter for ICM Monitoring  Patient Name: Patricia Morgan is a 69 y.o. female Date: 09/03/2019 Primary Care Physican: Antony Contras, MD Primary Cardiologist:Croitoru Electrophysiologist:Croitoru Nephrologist:[place] 3/16/2021Weight: 200-205lbs   Attempted call to patient and unable to reach.  Left message to return call. Transmission reviewed.   OptivolThoracic impedancenormal.  Prescribed:Furosemide40 mgtake 2tablets(80 mg)by mouth daily.Per patient, Dr Moshe Cipro has instructed her to take extra 40 mg if she has lower extremity swelling.  Labs: 04/15/2019 Creatinine 2.23, BUN 35, Potassium 4., Sodium 140, GFR 22-25 04/01/2019 Creatinine 2.37, BUN 35, Potassium 3.7, Sodium 142, GFR 20-23 01/17/2019 Creatinine 2.44, BUN 32, Potassium 4.2, Sodium 140, GFR 20-23 01/09/2019 Creatinine 2.47, BUN 34, Potassium 3.8, Sodium 141, GFR 19-22 07/05/2018 Creatinine 2.83, BUN 54, Potassium 4.3, Sodium 140, GFR 16-19  Recommendations: Unable to reach.    Follow-up plan: ICM clinic phone appointment on5/25/2021. 91 day device clinic remote transmission 10/07/2018.Office visit with Dr Sallyanne Kuster 09/23/2019.  Copy of ICM check sent to Dr.Croitoru.  3 month ICM trend: 09/02/2019    1 Year ICM trend:       Rosalene Billings, RN 09/03/2019 3:15 PM

## 2019-09-04 NOTE — Progress Notes (Addendum)
Spoke with patient and reports feeling well at this time.  Denies fluid symptoms.  Transmission reviewed.  No changes and encouraged to call if experiencing any fluid symptoms.   

## 2019-09-13 ENCOUNTER — Other Ambulatory Visit: Payer: Self-pay | Admitting: Family Medicine

## 2019-09-13 DIAGNOSIS — M8580 Other specified disorders of bone density and structure, unspecified site: Secondary | ICD-10-CM

## 2019-09-23 ENCOUNTER — Encounter: Payer: Self-pay | Admitting: Cardiovascular Disease

## 2019-09-23 ENCOUNTER — Other Ambulatory Visit: Payer: Self-pay

## 2019-09-23 ENCOUNTER — Ambulatory Visit (INDEPENDENT_AMBULATORY_CARE_PROVIDER_SITE_OTHER): Payer: Medicare Other | Admitting: Cardiovascular Disease

## 2019-09-23 VITALS — BP 112/58 | HR 62 | Ht 62.0 in | Wt 203.4 lb

## 2019-09-23 DIAGNOSIS — Z7901 Long term (current) use of anticoagulants: Secondary | ICD-10-CM

## 2019-09-23 DIAGNOSIS — Z95810 Presence of automatic (implantable) cardiac defibrillator: Secondary | ICD-10-CM | POA: Diagnosis not present

## 2019-09-23 DIAGNOSIS — I428 Other cardiomyopathies: Secondary | ICD-10-CM | POA: Diagnosis not present

## 2019-09-23 DIAGNOSIS — Z6837 Body mass index (BMI) 37.0-37.9, adult: Secondary | ICD-10-CM

## 2019-09-23 DIAGNOSIS — I10 Essential (primary) hypertension: Secondary | ICD-10-CM

## 2019-09-23 DIAGNOSIS — G4733 Obstructive sleep apnea (adult) (pediatric): Secondary | ICD-10-CM

## 2019-09-23 DIAGNOSIS — D6851 Activated protein C resistance: Secondary | ICD-10-CM

## 2019-09-23 DIAGNOSIS — E669 Obesity, unspecified: Secondary | ICD-10-CM

## 2019-09-23 DIAGNOSIS — I5042 Chronic combined systolic (congestive) and diastolic (congestive) heart failure: Secondary | ICD-10-CM

## 2019-09-23 DIAGNOSIS — R9431 Abnormal electrocardiogram [ECG] [EKG]: Secondary | ICD-10-CM

## 2019-09-23 DIAGNOSIS — E1169 Type 2 diabetes mellitus with other specified complication: Secondary | ICD-10-CM

## 2019-09-23 DIAGNOSIS — N184 Chronic kidney disease, stage 4 (severe): Secondary | ICD-10-CM

## 2019-09-23 NOTE — Patient Instructions (Signed)

## 2019-09-23 NOTE — Progress Notes (Signed)
Cardiology Office Note    Date:  09/24/2019   ID:  Patricia Morgan, DOB June 21, 1950, MRN [id number]  PCP:  Patricia Contras, MD  Cardiologist:   Patricia Klein, MD   Chief Complaint  Patient presents with  . Congestive Heart Failure  . Pacemaker Check    ICD    History of Present Illness:  Patricia Morgan is a 69 y.o. 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  Within the last month she has had some problems with volume overload and exertional dyspnea, as well as evidence of drop in thoracic impedance on her device.  She increased her dose of furosemide by adding another 40 mg in the afternoon and her symptoms have substantially improved.  She was able to climb 2 flights of [place] yesterday.  She has trivial ankle swelling.  At home, without her breast prosthesis she weighs 196 pounds.  In our office she weighs 203 pounds.  This this appears to be her baseline "dry weight"  ICD function is normal.  She has not had ventricular pacing, atrial fibrillation or ventricular tachycardia.  Estimated generator longevity is 10.7 years.  As mentioned her thoracic impedance fluctuated recently but has improved after the increase in diuretic dose.  We are struggling to maintain the balance between her renal function and heart problems.  Her nephrologist is Dr. Vanetta Morgan.  Most recent creatinine was up to 2.73 on September 13, 2019.  Her most recent echo performed in October 2019 showed severely decreased left ventricular systolic function with an ejection fraction of 25%.  A nuclear stress test was performed and shows a fixed inferior wall defect that I suspect is diaphragmatic attenuation artifact.  There was no reversible  ischemia.  A similar EF of 22% was reported.  She is compliant with carvedilol in the maximum usual dose and also takes hydralazine/nitrates at the highest dose tolerated by her blood pressure.  She is not receiving RAAS inhibitors due to chronic kidney disease.  Coronary angiography performed June 2017 showed no evidence of coronary artery disease but confirmed elevated filling pressure. PA pressure was 57/18 in the setting of a mean pulmonary wedge pressure of 33 mmHg. Echo showed ejection fraction of 45%. Nuclear scintigraphy calculated ejection fraction of 38%. It also suggested the presence of perfusion abnormalities, false positive by coronary angiography. She sees Dr. Moshe Cipro for chronic kidney disease and her baseline creatinine is around 2.0-2.5. Her primary care physician is Dr. Antony Morgan. Dr. Elsworth Morgan manages her sleep apnea treatment.  Her sister Patricia Morgan had coronary artery disease and long QT syndrome and passed away in August 15, 2017.  Past Medical History:  Diagnosis Date  . ANEMIA-NOS   . BREAST CANCER, HX OF 1994 & 1995   1994>>recurrence in 08/15/93, R breast, s/p lumpectomy>>mastectomy  . Cancer ([place])   . Cardiomyopathy, nonischemic ([place])   . CHF (congestive heart failure) (HCC)    chronic systolic and diastolic CHF  . CHRONIC KIDNEY DISEASE STAGE III (MODERATE)   . Clotting disorder ([place])    on eliquis for Factor 5 disorder  . Colon polyps   . DIABETES MELLITUS, TYPE II   . Diverticulosis   . HYPERLIPIDEMIA   . HYPERTENSION   . ILD (interstitial lung disease) ([place])    10/2018 CT findings suggestive of sarcoidosis; followed by Dr. Elsworth Morgan  .  OBSTRUCTIVE SLEEP APNEA    CPAP  . Personal history of chemotherapy   . Proteinuria   . PULMONARY EMBOLISM 09/24/2008   anticoag thru 03/2010  . Rotator cuff tear    Left  . Shoulder impingement, left   . Sleep apnea    Wears CPAP nightly    Past Surgical History:  Procedure Laterality Date  . BREAST SURGERY    . CARDIAC  CATHETERIZATION N/A 11/02/2015   Procedure: Right/Left Heart Cath and Coronary Angiography;  Surgeon: Patricia Blanks, MD;  Location: [facility];  Service: Cardiovascular;  Laterality: N/A;  . DILATION AND CURETTAGE OF UTERUS    . ICD IMPLANT N/A 03/14/2018   Procedure: ICD IMPLANT;  Surgeon: Patricia Klein, MD;  Location: [facility];  Service: Cardiovascular;  Laterality: N/A;  . insertion port a cath    . MASTECTOMY Right 1995   Right  . PORT-A-CATH REMOVAL    . SHOULDER ARTHROSCOPY WITH ROTATOR CUFF REPAIR AND SUBACROMIAL DECOMPRESSION Left 01/24/2019   Procedure: SHOULDER ARTHROSCOPY WITH ROTATOR CUFF REPAIR AND SUBACROMIAL DECOMPRESSION;  Surgeon: Patricia Stairs, MD;  Location: [place] [facility] [place];  Service: Orthopedics;  Laterality: Left;  2.5 hrs    Current Medications: Outpatient Medications Prior to Visit  Medication Sig Dispense Refill  . acetaminophen (TYLENOL) 500 MG tablet Take 500 mg by mouth every 6 (six) hours as needed for mild pain.    [name] allopurinol (ZYLOPRIM) 100 MG tablet Take 100 mg by mouth 2 (two) times daily.     . carvedilol (COREG) 25 MG tablet Take 1 tablet (25 mg total) by mouth 2 (two) times daily with a meal. 180 tablet 3  . Cholecalciferol (VITAMIN D-3) 5000 units TABS Take 5,000 Units by mouth daily.     . colchicine 0.6 MG tablet Take 0.6 mg by mouth daily.   1  . diphenhydrAMINE (BENADRYL) 25 MG tablet Take 25 mg by mouth daily.    [name] ELIQUIS 5 MG TABS tablet TAKE 1 TABLET TWICE DAILY 180 tablet 6  . ferrous sulfate 325 (65 FE) MG tablet Take 325 mg by mouth daily with breakfast.    . furosemide (LASIX) 40 MG tablet Take 2 tablets (80 mg total) by mouth daily. 30 tablet 11  . hydrALAZINE (APRESOLINE) 10 MG tablet TAKE 1 TABLET THREE TIMES DAILY 270 tablet 3  . isosorbide mononitrate (IMDUR) 30 MG 24 hr tablet TAKE 1 TABLET EVERY DAY 90 tablet 3  . rosuvastatin (CRESTOR) 10 MG tablet TAKE 1 TABLET EVERY DAY 90 tablet 3  . TURMERIC PO Take  1,000 mg by mouth daily.    . vitamin B-12 (CYANOCOBALAMIN) 1000 MCG tablet Take 1,000 mcg by mouth daily.    . vitamin E 400 UNIT capsule Take 400 Units by mouth daily.     No facility-administered medications prior to visit.     Allergies:   Patient has no known allergies.   Social History   Socioeconomic History  . Marital status: Divorced    Spouse name: Not on file  . Number of children: 2  . Years of education: 45  . Highest education level: Not on file  Occupational History  . Occupation: retired  Tobacco Use  . Smoking status: Never Smoker  . Smokeless tobacco: Never Used  . Tobacco comment: Lives alone-divorced  Substance and Sexual Activity  . Alcohol use: Yes    Alcohol/week: 0.0 standard drinks    Comment: occ  . Drug use: No  . Sexual  activity: Not on file  Other Topics Concern  . Not on file  Social History Narrative  . Not on file   Social Determinants of Health   Financial Resource Strain:   . Difficulty of Paying Living Expenses:   Food Insecurity:   . Worried About Charity fundraiser in the Last Year:   . Arboriculturist in the Last Year:   Transportation Needs:   . Film/video editor (Medical):   [name] Lack of Transportation (Non-Medical):   Physical Activity:   . Days of Exercise per Week:   . Minutes of Exercise per Session:   Stress:   . Feeling of Stress :   Social Connections:   . Frequency of Communication with Friends and Family:   . Frequency of Social Gatherings with Friends and Family:   . Attends Religious Services:   . Active Member of Clubs or Organizations:   . Attends Archivist Meetings:   [name] Marital Status:      Family History:  The patient's family history includes Asthma in her father; Diabetes in her sister; Parkinson's disease in her mother. Her sister had long QT syndrome, but no documented ventricular arrhythmia  ROS:   Please see the history of present illness.    ROS  All other systems are reviewed and  are negative.   PHYSICAL EXAM:   VS:  BP (!) 112/58   Pulse 62   Ht 5\' 2"  (1.575 m)   Wt 203 lb 6.4 oz (92.3 kg)   SpO2 99%   BMI 37.20 kg/m      General: Alert, oriented x3, no distress, healthy left subclavian ICD site Head: no evidence of trauma, PERRL, EOMI, no exophtalmos or lid lag, no myxedema, no xanthelasma; normal ears, nose and oropharynx Neck: normal jugular venous pulsations and no hepatojugular reflux; brisk carotid pulses without delay and no carotid bruits Chest: clear to auscultation, no signs of consolidation by percussion or palpation, normal fremitus, symmetrical and full respiratory excursions Cardiovascular: normal position and quality of the apical impulse, regular rhythm, normal first and second heart sounds, no murmurs, rubs or gallops Abdomen: no tenderness or distention, no masses by palpation, no abnormal pulsatility or arterial bruits, normal bowel sounds, no hepatosplenomegaly Extremities: no clubbing, cyanosis or edema; 2+ radial, ulnar and brachial pulses bilaterally; 2+ right femoral, posterior tibial and dorsalis pedis pulses; 2+ left femoral, posterior tibial and dorsalis pedis pulses; no subclavian or femoral bruits Neurological: grossly nonfocal Psych: Normal mood and affect   Wt Readings from Last 3 Encounters:  09/23/19 203 lb 6.4 oz (92.3 kg)  08/12/19 208 lb 6.4 oz (94.5 kg)  04/15/19 199 lb (90.3 kg)      Studies/Labs Reviewed:   EKG:  EKG is not ordered today.  ECG from 08/12/2019 shows sinus rhythm with borderline first-degree AV block and prolonged QTC 464 ms.  No ischemic changes. Recent Labs: 04/15/2019: ALT 11; Hemoglobin 10.8; Platelet Count 138 08/12/2019: BUN 43; Creatinine, Ser 2.56; NT-Pro BNP 7,259; Potassium 4.0; Sodium 141   Lipid Panel    Component Value Date/Time   CHOL 184 09/13/2012 1357   TRIG 142 09/13/2012 1357   HDL 45 09/13/2012 1357   CHOLHDL 4.1 09/13/2012 1357   VLDL 28 09/13/2012 1357   LDLCALC 111 (H)  09/13/2012 1357   LDLDIRECT 141.3 10/14/2008 1004   ASSESSMENT:    1. Chronic combined systolic and diastolic CHF (congestive heart failure) ([place])   2. Nonischemic cardiomyopathy ([place])  3. Long QT interval   4. ICD (implantable cardioverter-defibrillator) in place   5. Factor V Leiden ([place])   6. Long term current use of anticoagulant   7. Essential hypertension   8. OSA (obstructive sleep apnea)   9. CKD (chronic kidney disease) stage 4, GFR 15-29 ml/min (HCC)   10. Class 2 severe obesity due to excess calories with serious comorbidity and body mass index (BMI) of 37.0 to 37.9 in adult ([place])   11. Diabetes mellitus type 2 in obese [facility])    PLAN:  In order of problems listed above:  1. CHF: Improved after increasing diuretic dose, currently taking 120 mg of furosemide daily.  NYHA functional class II.  She is on maximum dose carvedilol.  Unable to prescribe RAAS inhibitors due to renal dysfunction, on hydralazine/nitrates vasodilator therapy.  2. CMP: Nonischemic mechanism.  Normal coronary arteries a couple of years ago, by angiography.  Possibly received Adriamycin for chemotherapy for breast cancer in 1994.  Might also have some familial cardiomyopathy. 3. Long QT: Her sister had long QT syndrome.  Teresa's QT interval has always been mildly prolonged generally around 460-470 ms, on one occasion up to 490.  Similar range today.  Potassium was recently okay. 4. ICD: Normal device function.  Continue remote downloads every 3 months for comprehensive checks as well as monthly thoracic impedance/OptiVol downloads. 5. Recurrent DVT/PE: On lifelong anticoagulation for hypercoagulable state due to heterozygous factor V Leiden.  6. Eliquis: Denies any bleeding problems. 7. HTN: Excellent control 8. OSA: Using CPAP faithfully 100% of the time and feels great benefit from its use.  Followed by Dr. Elsworth Morgan. 9. CKD 4: Creatinine has been inching up slowly over the last year.  Sees Dr. Moshe Cipro.    GFR approximately 15-20. 10. Obesity: Weight has stabilized in the severely obese range 11. DM: Hemoglobin A1c 5.9%, well controlled.   Medication Adjustments/Labs and Tests Ordered: Current medicines are reviewed at length with the patient today.  Concerns regarding medicines are outlined above.  Medication changes, Labs and Tests ordered today are listed in the Patient Instructions below. Patient Instructions  Medication Instructions:  No changes *If you need a refill on your cardiac medications before your next appointment, please call your pharmacy*   Lab Work: None ordered If you have labs (blood work) drawn today and your tests are completely normal, you will receive your results only by: [name] MyChart Message (if you have MyChart) OR . A paper copy in the mail If you have any lab test that is abnormal or we need to change your treatment, we will call you to review the results.   Testing/Procedures: None ordered   Follow-Up: At [facility], you and your health needs are our priority.  As part of our continuing mission to provide you with exceptional heart care, we have created designated Provider Care Teams.  These Care Teams include your primary Cardiologist (physician) and Advanced Practice Providers (APPs -  Physician Assistants and Nurse Practitioners) who all work together to provide you with the care you need, when you need it.  We recommend signing up for the patient portal called "MyChart".  Sign up information is provided on this After Visit Summary.  MyChart is used to connect with patients for Virtual Visits (Telemedicine).  Patients are able to view lab/test results, encounter notes, upcoming appointments, etc.  Non-urgent messages can be sent to your provider as well.   To learn more about what you can do with MyChart, go to  [email or web address].    Your next appointment:   6 month(s)  The format for your next appointment:   In Person  Provider:   Sanda Klein, MD       Signed, Patricia Klein, MD  09/24/2019 8:36 AM    [address], [place], [place]  [address] Phone: [phone]; Fax: [phone]

## 2019-09-24 ENCOUNTER — Encounter: Payer: Self-pay | Admitting: Cardiovascular Disease

## 2019-09-24 ENCOUNTER — Telehealth: Payer: Self-pay | Admitting: *Deleted

## 2019-09-24 NOTE — Telephone Encounter (Signed)
Call from [place] at [facility], requesting clearance for for pt to have 10 upper teeth extracted tomorrow. Formed faxed back to [facility] with recommendations for pt to stop eliquis x 2 days prior to procedure.

## 2019-10-07 ENCOUNTER — Ambulatory Visit (INDEPENDENT_AMBULATORY_CARE_PROVIDER_SITE_OTHER): Payer: Medicare Other | Admitting: *Deleted

## 2019-10-07 DIAGNOSIS — I428 Other cardiomyopathies: Secondary | ICD-10-CM | POA: Diagnosis not present

## 2019-10-07 LAB — CUP PACEART REMOTE DEVICE CHECK
Battery Remaining Longevity: 129 mo
Battery Voltage: 3.03 V
Brady Statistic RV Percent Paced: 0.01 %
Date Time Interrogation Session: 20210524044223
HighPow Impedance: 77 Ohm
Implantable Lead Implant Date: 20191030
Implantable Lead Location: 753860
Implantable Pulse Generator Implant Date: 20191030
Lead Channel Impedance Value: 342 Ohm
Lead Channel Impedance Value: 399 Ohm
Lead Channel Pacing Threshold Amplitude: 0.875 V
Lead Channel Pacing Threshold Pulse Width: 0.4 ms
Lead Channel Sensing Intrinsic Amplitude: 23.25 mV
Lead Channel Sensing Intrinsic Amplitude: 23.25 mV
Lead Channel Setting Pacing Amplitude: 2 V
Lead Channel Setting Pacing Pulse Width: 0.4 ms
Lead Channel Setting Sensing Sensitivity: 0.3 mV

## 2019-10-08 ENCOUNTER — Ambulatory Visit (INDEPENDENT_AMBULATORY_CARE_PROVIDER_SITE_OTHER): Payer: Medicare Other

## 2019-10-08 DIAGNOSIS — I5042 Chronic combined systolic (congestive) and diastolic (congestive) heart failure: Secondary | ICD-10-CM

## 2019-10-08 DIAGNOSIS — Z95810 Presence of automatic (implantable) cardiac defibrillator: Secondary | ICD-10-CM | POA: Diagnosis not present

## 2019-10-08 NOTE — Progress Notes (Signed)
Remote ICD transmission.   

## 2019-10-11 ENCOUNTER — Telehealth: Payer: Self-pay

## 2019-10-11 NOTE — Progress Notes (Signed)
EPIC Encounter for ICM Monitoring  Patient Name: Patricia Morgan is a 69 y.o. female Date: 10/11/2019 Primary Care Physican: Antony Contras, MD Primary Cardiologist:Croitoru Electrophysiologist:Croitoru Nephrologist:[place] 3/16/2021Weight: 200-205lbs   Attempted call to patient and unable to reach.    Transmission reviewed.   OptivolThoracic impedancenormal.  Prescribed:Furosemide40 mgtake 2tablets(80 mg)by mouth daily.Per patient, Dr Moshe Cipro has instructed her to take extra 40 mg if she has lower extremity swelling.  Labs: 04/15/2019 Creatinine 2.23, BUN 35, Potassium 4., Sodium 140, GFR 22-25 04/01/2019 Creatinine 2.37, BUN 35, Potassium 3.7, Sodium 142, GFR 20-23 01/17/2019 Creatinine 2.44, BUN 32, Potassium 4.2, Sodium 140, GFR 20-23 01/09/2019 Creatinine 2.47, BUN 34, Potassium 3.8, Sodium 141, GFR 19-22 07/05/2018 Creatinine 2.83, BUN 54, Potassium 4.3, Sodium 140, GFR 16-19  Recommendations: Unable to reach.    Follow-up plan: ICM clinic phone appointment on6/28/2021. 91 day device clinic remote transmission8/23/2020.  Copy of ICM check sent to Dr.Croitoru.  3 month ICM trend: 10/07/2019    1 Year ICM trend:       Rosalene Billings, RN 10/11/2019 3:27 PM

## 2019-10-11 NOTE — Telephone Encounter (Signed)
Remote ICM transmission received.  Attempted call to patient regarding ICM remote transmission and no answer.  

## 2019-10-21 ENCOUNTER — Telehealth: Payer: Self-pay | Admitting: Cardiovascular Disease

## 2019-10-21 NOTE — Telephone Encounter (Signed)
1. What dental office are you calling from? [facility]   2. What is your office phone number? [phone]    3. What is your fax number? [phone]  4. What type of procedure is the patient having performed?  2 teeth surgically extracted  5. -                                IV Sedation  6. at date is procedure scheduled or is the patient there now? 10-29-19  (if the patient is at the dentist's office question goes to their cardiologist if he/she is in the office.  If not, question should go to the DOD).   7. What is your question (ex. Antibiotics prior to procedure, holding medication-we need to know how long dentist wants pt to hold med)? Wants to know if pt is medically cleared - need this clearance by 10-23-19(Wednesday)

## 2019-10-21 NOTE — Telephone Encounter (Signed)
Patient called stating she is going to have a dental procedure and needs clearance. I informed her to have the dentist office fax over a clearance request.

## 2019-10-21 NOTE — Telephone Encounter (Signed)
   Primary Cardiologist: Sanda Klein, MD  Chart reviewed as part of pre-operative protocol coverage. Patient is scheduled to have 2 wisdom teeth extracted. Dental procedures are usually very low risk procedures although patient reports she may receive IV sedation for this. Patient recently seen by Dr. Sallyanne Kuster on 09/23/2019 at which time she was doing much better after diuretics had been increased by Nephrology. Patient contacted on 10/21/2019 for pre-op risk assessment and is stable from a cardiac standpoint. She is able to complete >4.0 METS from a cardiac standpoint. Given past medical history and time since last visit, based on ACC/AHA guidelines, Shylo Zamor would be at acceptable risk for the planned procedure without further cardiovascular testing.   Patient on Eliquis for recurrent DVT/PE due to heterozygous factor V Leiden. Therefore, will defer holding of Eliquis to PCP/Hematology.   Patient does not require SBE prophylaxis.   I will route this recommendation to the requesting party via Epic fax function and remove from pre-op pool.  Please call with questions.  Darreld Mclean, PA-C 10/21/2019, 4:05 PM

## 2019-11-04 ENCOUNTER — Ambulatory Visit (INDEPENDENT_AMBULATORY_CARE_PROVIDER_SITE_OTHER): Payer: Medicare Other

## 2019-11-04 DIAGNOSIS — Z95810 Presence of automatic (implantable) cardiac defibrillator: Secondary | ICD-10-CM

## 2019-11-04 DIAGNOSIS — I5042 Chronic combined systolic (congestive) and diastolic (congestive) heart failure: Secondary | ICD-10-CM

## 2019-11-06 NOTE — Progress Notes (Signed)
Thanks. If anything, it looks like she needs to watch out that she does not get too "dry".

## 2019-11-06 NOTE — Progress Notes (Signed)
EPIC Encounter for ICM Monitoring  Patient Name: Patricia Morgan is a 69 y.o. female Date: 11/06/2019 Primary Care Physican: Antony Contras, MD Primary Cardiologist:Croitoru Electrophysiologist:Croitoru Nephrologist:[place] 6/23/2021Weight: 182lbs   Spoke with patient.  She had dental surgery and have difficulty eating. She has been drinking a lot of fluids.   She will be getting a partial plate soon.  OptivolThoracic impedancenormal.  Prescribed:Furosemide40 mgtake 2tablets(80 mg)by mouth daily and 40 mg in the afternoon.Per patient, Dr Moshe Cipro has instructed her another extra 40 mg if she has lower extremity swelling.  Labs: 04/15/2019 Creatinine 2.23, BUN 35, Potassium 4., Sodium 140, GFR 22-25 04/01/2019 Creatinine 2.37, BUN 35, Potassium 3.7, Sodium 142, GFR 20-23 01/17/2019 Creatinine 2.44, BUN 32, Potassium 4.2, Sodium 140, GFR 20-23 01/09/2019 Creatinine 2.47, BUN 34, Potassium 3.8, Sodium 141, GFR 19-22 07/05/2018 Creatinine 2.83, BUN 54, Potassium 4.3, Sodium 140, GFR 16-19  Recommendations: No changes and encouraged to call if experiencing any fluid symptoms.  Follow-up plan: ICM clinic phone appointment on7/26/2021. 91 day device clinic remote transmission8/23/2020.  Copy of ICM check sent to Dr.Croitoru.  3 month ICM trend: 11/04/2019    1 Year ICM trend:       Rosalene Billings, RN 11/06/2019 4:28 PM

## 2019-11-15 ENCOUNTER — Other Ambulatory Visit: Payer: Self-pay

## 2019-11-15 ENCOUNTER — Ambulatory Visit
Admission: RE | Admit: 2019-11-15 | Discharge: 2019-11-15 | Disposition: A | Discharge status: Home / Self Care | Payer: Medicare Other | Source: Ambulatory Visit | Attending: Family Medicine | Admitting: Family Medicine

## 2019-11-15 DIAGNOSIS — M8580 Other specified disorders of bone density and structure, unspecified site: Secondary | ICD-10-CM

## 2019-12-09 ENCOUNTER — Ambulatory Visit (INDEPENDENT_AMBULATORY_CARE_PROVIDER_SITE_OTHER): Payer: Medicare Other

## 2019-12-09 DIAGNOSIS — Z95810 Presence of automatic (implantable) cardiac defibrillator: Secondary | ICD-10-CM | POA: Diagnosis not present

## 2019-12-09 DIAGNOSIS — I5042 Chronic combined systolic (congestive) and diastolic (congestive) heart failure: Secondary | ICD-10-CM | POA: Diagnosis not present

## 2019-12-11 ENCOUNTER — Telehealth: Payer: Self-pay

## 2019-12-11 NOTE — Progress Notes (Signed)
EPIC Encounter for ICM Monitoring  Patient Name: Statia Burdick is a 69 y.o. female Date: 12/11/2019 Primary Care Physican: Antony Contras, MD Primary Cardiologist:Croitoru Electrophysiologist:Croitoru Nephrologist:[place] 6/23/2021Weight: 182lbs   Attempted call to patient and unable to reach.  Left message to return call. Transmission reviewed.   OptivolThoracic impedancenormal.  Prescribed: Furosemide40 mgtake 2tablets(80 mg)by mouth daily and 40 mg in the afternoon.Per patient, Dr Moshe Cipro has instructed her another extra 40 mg if she has lower extremity swelling.  Labs: 08/12/2019 Creatinine 2.56, BUN 43, Potassium 4.0, Sodium 141, GFR 18-21 A complete set of results can be found in Results Review.  Recommendations: Unable to reach.    Follow-up plan: ICM clinic phone appointment on8/30/2021. 91 day device clinic remote transmission8/23/2020.  EP/Cardiology Office Visits: Recall for 03/21/2020 with Dr. Sallyanne Kuster.    Copy of ICM check sent to Dr. Sallyanne Kuster.   3 month ICM trend: 12/09/2019    1 Year ICM trend:       Rosalene Billings, RN 12/11/2019 8:10 AM

## 2019-12-11 NOTE — Progress Notes (Signed)
Good. Thanks!

## 2019-12-11 NOTE — Telephone Encounter (Signed)
Remote ICM transmission received.  Attempted call to patient regarding ICM remote transmission and left message per DPR.

## 2019-12-16 ENCOUNTER — Other Ambulatory Visit: Payer: Self-pay | Admitting: Cardiovascular Disease

## 2019-12-16 ENCOUNTER — Other Ambulatory Visit: Payer: Self-pay

## 2019-12-16 ENCOUNTER — Telehealth: Payer: Self-pay | Admitting: Cardiovascular Disease

## 2019-12-16 MED ORDER — CARVEDILOL 25 MG PO TABS: 25.0000 mg | ORAL_TABLET | Freq: Two times a day (BID) | ORAL | 3 refills | Status: DC

## 2019-12-16 NOTE — Telephone Encounter (Signed)
New Message   *STAT* If patient is at the pharmacy, call can be transferred to refill team.   1. Which medications need to be refilled? (please list name of each medication and dose if known) carvedilol (COREG) 25 MG tablet  2. Which pharmacy/location (including street and city if local pharmacy) is medication to be sent to? [id number] - [place], [place] - [address]  3. Do they need a 30 day or 90 day supply? Short supply (2 weeks worth)  Patient is out of medication. Needs today.

## 2019-12-16 NOTE — Telephone Encounter (Signed)
New Message   *STAT* If patient is at the pharmacy, call can be transferred to refill team.   1. Which medications need to be refilled? (please list name of each medication and dose if known)  carvedilol (COREG) 25 MG tablet furosemide (LASIX) 40 MG tablet hydrALAZINE (APRESOLINE) 10 MG tablet isosorbide mononitrate (IMDUR) 30 MG 24 hr tablet rosuvastatin (CRESTOR) 10 MG tablet  2. Which pharmacy/location (including street and city if local pharmacy) is medication to be sent to? [place], [place] [employer], [address]  3. Do they need a 30 day or 90 day supply? 90 day   Pharmacy change

## 2019-12-16 NOTE — Telephone Encounter (Signed)
Called and spoke with pt, notified the pt that the refill was sent to her pharmacy. Pt verbalized understanding with no other questions at this time.

## 2019-12-17 MED ORDER — ROSUVASTATIN CALCIUM 10 MG PO TABS: 10.0000 mg | ORAL_TABLET | Freq: Every day | ORAL | 3 refills | Status: DC

## 2019-12-17 MED ORDER — ISOSORBIDE MONONITRATE ER 30 MG PO TB24: ORAL_TABLET | ORAL | 3 refills | Status: DC

## 2019-12-17 MED ORDER — HYDRALAZINE HCL 10 MG PO TABS: ORAL_TABLET | ORAL | 3 refills | Status: DC

## 2019-12-17 MED ORDER — FUROSEMIDE 40 MG PO TABS: 80.0000 mg | ORAL_TABLET | Freq: Every day | ORAL | Status: DC

## 2020-01-06 ENCOUNTER — Ambulatory Visit (INDEPENDENT_AMBULATORY_CARE_PROVIDER_SITE_OTHER): Payer: Medicare Other | Admitting: *Deleted

## 2020-01-06 DIAGNOSIS — I5042 Chronic combined systolic (congestive) and diastolic (congestive) heart failure: Secondary | ICD-10-CM | POA: Diagnosis not present

## 2020-01-06 DIAGNOSIS — I428 Other cardiomyopathies: Secondary | ICD-10-CM

## 2020-01-07 LAB — CUP PACEART REMOTE DEVICE CHECK
Battery Remaining Longevity: 126 mo
Battery Voltage: 3.03 V
Brady Statistic RV Percent Paced: 0.01 %
Date Time Interrogation Session: 20210823022605
HighPow Impedance: 75 Ohm
Implantable Lead Implant Date: 20191030
Implantable Lead Location: 753860
Implantable Pulse Generator Implant Date: 20191030
Lead Channel Impedance Value: 342 Ohm
Lead Channel Impedance Value: 418 Ohm
Lead Channel Pacing Threshold Amplitude: 0.75 V
Lead Channel Pacing Threshold Pulse Width: 0.4 ms
Lead Channel Sensing Intrinsic Amplitude: 26.625 mV
Lead Channel Sensing Intrinsic Amplitude: 26.625 mV
Lead Channel Setting Pacing Amplitude: 2 V
Lead Channel Setting Pacing Pulse Width: 0.4 ms
Lead Channel Setting Sensing Sensitivity: 0.3 mV

## 2020-01-10 NOTE — Progress Notes (Signed)
Remote ICD transmission.   

## 2020-01-13 ENCOUNTER — Ambulatory Visit (INDEPENDENT_AMBULATORY_CARE_PROVIDER_SITE_OTHER): Payer: Medicare Other

## 2020-01-13 DIAGNOSIS — Z95810 Presence of automatic (implantable) cardiac defibrillator: Secondary | ICD-10-CM | POA: Diagnosis not present

## 2020-01-13 DIAGNOSIS — I5042 Chronic combined systolic (congestive) and diastolic (congestive) heart failure: Secondary | ICD-10-CM | POA: Diagnosis not present

## 2020-01-15 NOTE — Progress Notes (Signed)
Thanks, agree with no changes

## 2020-01-15 NOTE — Progress Notes (Signed)
EPIC Encounter for ICM Monitoring  Patient Name: Patricia Morgan is a 69 y.o. female Date: 01/15/2020 Primary Care Physican: Antony Contras, MD Primary Cardiologist:Croitoru Electrophysiologist:Croitoru Nephrologist:[place] 9/1/2021Weight:194lbs   Spoke with patient and reports feeling well at this time.  Denies fluid symptoms.  She said she has been snacking more and learning how to bake pounds cakes which may causing a little fluid.   OptivolThoracic impedancesuggesting normal fluid levels but starting slight downward trend on day of transmission.  Prescribed: Furosemide40 mgtake 2tablets(80 mg)by mouth dailyand 40 mg in the afternoon.Per patient, Dr Moshe Cipro has instructed heranotherextra 40 mg if she has lower extremity swelling.  Labs: 08/12/2019 Creatinine 2.56, BUN 43, Potassium 4.0, Sodium 141, GFR 18-21 A complete set of results can be found in Results Review.  Recommendations: No changes and encouraged to call if experiencing any fluid symptoms.   Follow-up plan: ICM clinic phone appointment on10/08/2019. 91 day device clinic remote transmission 04/06/2020.  EP/Cardiology Office Visits: Recall for 03/21/2020 with Dr. Sallyanne Kuster.    Copy of ICM check sent to Dr. Sallyanne Kuster.    3 month ICM trend: 01/13/2020    1 Year ICM trend:       Rosalene Billings, RN 01/15/2020 9:21 AM

## 2020-01-29 ENCOUNTER — Other Ambulatory Visit: Payer: Self-pay | Admitting: Family Medicine

## 2020-01-29 DIAGNOSIS — Z1231 Encounter for screening mammogram for malignant neoplasm of breast: Secondary | ICD-10-CM

## 2020-02-06 ENCOUNTER — Ambulatory Visit: Payer: Medicare Other

## 2020-02-17 ENCOUNTER — Ambulatory Visit (INDEPENDENT_AMBULATORY_CARE_PROVIDER_SITE_OTHER): Payer: Medicare Other

## 2020-02-17 DIAGNOSIS — I5042 Chronic combined systolic (congestive) and diastolic (congestive) heart failure: Secondary | ICD-10-CM | POA: Diagnosis not present

## 2020-02-17 DIAGNOSIS — Z95810 Presence of automatic (implantable) cardiac defibrillator: Secondary | ICD-10-CM | POA: Diagnosis not present

## 2020-02-18 ENCOUNTER — Ambulatory Visit: Payer: Medicare Other

## 2020-02-18 ENCOUNTER — Ambulatory Visit
Admission: RE | Admit: 2020-02-18 | Discharge: 2020-02-18 | Disposition: A | Discharge status: Home / Self Care | Payer: Medicare Other | Source: Ambulatory Visit | Attending: Family Medicine | Admitting: Family Medicine

## 2020-02-18 ENCOUNTER — Other Ambulatory Visit: Payer: Self-pay

## 2020-02-18 DIAGNOSIS — Z1231 Encounter for screening mammogram for malignant neoplasm of breast: Secondary | ICD-10-CM

## 2020-02-20 NOTE — Progress Notes (Signed)
EPIC Encounter for ICM Monitoring  Patient Name: Patricia Morgan is a 69 y.o. female Date: 02/20/2020 Primary Care Physican: Antony Contras, MD Primary Cardiologist:Croitoru Electrophysiologist:Croitoru Nephrologist:[place] 10/7/2021Weight:194lbs   Spoke with patient and reports feeling well at this time.  Denies fluid symptoms.  She is followed closely by nephrologist.     OptivolThoracic impedancesuggesting normal fluid levels.  Prescribed: Furosemide40 mgtake 2tablets(80 mg)by mouth dailyand 40 mg in the afternoon.Per patient, Dr Moshe Cipro has instructed heranotherextra 40 mg if she has lower extremity swelling.  Labs: 08/12/2019 Creatinine2.56, BUN43, Potassium4.0, Sodium141, [id number] A complete set of results can be found in Results Review.  Recommendations: No changes and encouraged to call if experiencing any fluid symptoms.  Follow-up plan: ICM clinic phone appointment on11/23/2021. 91 day device clinic remote transmission 04/06/2020.  EP/Cardiology Office Visits: 03/23/2020 with Dr.Croitoru.   Copy of ICM check sent to Dr.Croitoru.   3 month ICM trend: 02/17/2020    1 Year ICM trend:       Rosalene Billings, RN 02/20/2020 1:27 PM

## 2020-02-20 NOTE — Progress Notes (Signed)
TY

## 2020-02-28 ENCOUNTER — Telehealth: Payer: Self-pay | Admitting: Cardiovascular Disease

## 2020-02-28 ENCOUNTER — Telehealth: Payer: Self-pay

## 2020-02-28 NOTE — Telephone Encounter (Signed)
Pt called with active chest "tightness, discomfort" since Wednesday night. She has needed to sleep upright, which has helped. She c/o "slight" sob and nausea the other night. She notices she is not breathing as she normally is. Pt has been advised to report to ED for evaluation-she verbalized understanding.

## 2020-02-28 NOTE — Telephone Encounter (Signed)
Pt c/o of Chest Pain: STAT if CP now or developed within 24 hours  1. Are you having CP right now? Discomfort right now, yes  2. Are you experiencing any other symptoms (ex. SOB, nausea, vomiting, sweating)? no  3. How long have you been experiencing CP? Discomfort Started Wednesday night. She had to sleep Wednesday night and last night sleeping up.   4. Is your CP continuous or coming and going? continuous  5. Have you taken Nitroglycerin? no ?

## 2020-03-23 ENCOUNTER — Other Ambulatory Visit: Payer: Self-pay

## 2020-03-23 ENCOUNTER — Encounter: Payer: Self-pay | Admitting: Cardiovascular Disease

## 2020-03-23 ENCOUNTER — Ambulatory Visit (INDEPENDENT_AMBULATORY_CARE_PROVIDER_SITE_OTHER): Payer: Medicare Other | Admitting: Cardiovascular Disease

## 2020-03-23 VITALS — BP 126/61 | HR 74 | Ht 62.0 in | Wt 207.6 lb

## 2020-03-23 DIAGNOSIS — G4733 Obstructive sleep apnea (adult) (pediatric): Secondary | ICD-10-CM

## 2020-03-23 DIAGNOSIS — Z7901 Long term (current) use of anticoagulants: Secondary | ICD-10-CM

## 2020-03-23 DIAGNOSIS — N184 Chronic kidney disease, stage 4 (severe): Secondary | ICD-10-CM

## 2020-03-23 DIAGNOSIS — Z95810 Presence of automatic (implantable) cardiac defibrillator: Secondary | ICD-10-CM

## 2020-03-23 DIAGNOSIS — I5042 Chronic combined systolic (congestive) and diastolic (congestive) heart failure: Secondary | ICD-10-CM

## 2020-03-23 DIAGNOSIS — I428 Other cardiomyopathies: Secondary | ICD-10-CM | POA: Diagnosis not present

## 2020-03-23 DIAGNOSIS — R9431 Abnormal electrocardiogram [ECG] [EKG]: Secondary | ICD-10-CM | POA: Diagnosis not present

## 2020-03-23 DIAGNOSIS — Z6835 Body mass index (BMI) 35.0-35.9, adult: Secondary | ICD-10-CM

## 2020-03-23 DIAGNOSIS — I10 Essential (primary) hypertension: Secondary | ICD-10-CM

## 2020-03-23 DIAGNOSIS — Z86718 Personal history of other venous thrombosis and embolism: Secondary | ICD-10-CM

## 2020-03-23 DIAGNOSIS — E1169 Type 2 diabetes mellitus with other specified complication: Secondary | ICD-10-CM

## 2020-03-23 DIAGNOSIS — E669 Obesity, unspecified: Secondary | ICD-10-CM

## 2020-03-23 NOTE — Patient Instructions (Signed)

## 2020-03-23 NOTE — Progress Notes (Signed)
Cardiology Office Note    Date:  03/23/2020   ID:  Patricia Morgan, DOB 02-Dec-1950, MRN [id number]  PCP:  Antony Contras, MD  Cardiologist:   Sanda Klein, MD   Chief Complaint  Patient presents with  . Congestive Heart Failure    History of Present Illness:  Patricia Morgan is a 69 y.o. 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  She feels well. She has minimal ankle edema, but does not have dyspnea with activity or orthopnea. Activity level has increased substantially compared to the spring, from an average of 0.5 h/day to 1.5 h/day, based on her ICD readings. Her weight has increased just a couple of pounds (at home she weighs 7 lb less when she removes her breast prosthesis). Optivol is very stable over the last 6 months.   ICD function is normal, full interrogation today.  She has not had ventricular pacing, atrial fibrillation or ventricular tachycardia.  Estimated generator longevity is 10.4 years.  As mentioned her thoracic impedance fluctuated recently but has improved after the increase in diuretic dose.  We are struggling to maintain the balance between her renal function and heart problems.  Her nephrologist is Dr. Vanetta Mulders (she has an appt with her later today).  Most recent creatinine was 2.91 on Nov 01, up from 2.73 on September 13, 2019.  Her most recent echo performed in October 2019 showed severely decreased left ventricular systolic function with an ejection fraction of 25%.  A nuclear stress test was performed and shows a fixed inferior wall defect that I suspect is diaphragmatic attenuation artifact.  There was no reversible ischemia.  A similar EF of 22% was reported.  She is compliant with  carvedilol in the maximum usual dose and also takes hydralazine/nitrates at the highest dose tolerated by her blood pressure.  She is not receiving RAAS inhibitors due to chronic kidney disease.  Coronary angiography performed June 2017 showed no evidence of coronary artery disease but confirmed elevated filling pressure. PA pressure was 57/18 in the setting of a mean pulmonary wedge pressure of 33 mmHg. Echo showed ejection fraction of 45%. Nuclear scintigraphy calculated ejection fraction of 38%. It also suggested the presence of perfusion abnormalities, false positive by coronary angiography. She sees Dr. Moshe Cipro for chronic kidney disease and her baseline creatinine is around 2.0-2.5. Her primary care physician is Dr. Antony Contras. Dr. Elsworth Soho manages her sleep apnea treatment.  Her sister [name] had coronary artery disease and long QT syndrome and passed away in 08/11/17.  Past Medical History:  Diagnosis Date  . ANEMIA-NOS   . BREAST CANCER, HX OF 1994 & 1995   1994>>recurrence in August 11, 1993, R breast, s/p lumpectomy>>mastectomy  . Cancer ([place])   . Cardiomyopathy, nonischemic ([place])   . CHF (congestive heart failure) (HCC)    chronic systolic and diastolic CHF  . CHRONIC KIDNEY DISEASE STAGE III (MODERATE)   . Clotting disorder ([place])    on eliquis for Factor 5 disorder  . Colon polyps   . DIABETES MELLITUS, TYPE II   . Diverticulosis   . HYPERLIPIDEMIA   . HYPERTENSION   . ILD (interstitial lung disease) ([place])    10/2018 CT findings suggestive of sarcoidosis; followed by Dr. Elsworth Soho  . OBSTRUCTIVE SLEEP APNEA    CPAP  . Personal history of chemotherapy   .  Proteinuria   . PULMONARY EMBOLISM 09/24/2008   anticoag thru 03/2010  . Rotator cuff tear    Left  . Shoulder impingement, left   . Sleep apnea    Wears CPAP nightly    Past Surgical History:  Procedure Laterality Date  . BREAST SURGERY    . CARDIAC CATHETERIZATION N/A 11/02/2015   Procedure: Right/Left Heart Cath and  Coronary Angiography;  Surgeon: Burnell Blanks, MD;  Location: [facility];  Service: Cardiovascular;  Laterality: N/A;  . DILATION AND CURETTAGE OF UTERUS    . ICD IMPLANT N/A 03/14/2018   Procedure: ICD IMPLANT;  Surgeon: Sanda Klein, MD;  Location: [facility];  Service: Cardiovascular;  Laterality: N/A;  . insertion port a cath    . MASTECTOMY Right 1995   Right  . PORT-A-CATH REMOVAL    . SHOULDER ARTHROSCOPY WITH ROTATOR CUFF REPAIR AND SUBACROMIAL DECOMPRESSION Left 01/24/2019   Procedure: SHOULDER ARTHROSCOPY WITH ROTATOR CUFF REPAIR AND SUBACROMIAL DECOMPRESSION;  Surgeon: Nicholes Stairs, MD;  Location: [facility];  Service: Orthopedics;  Laterality: Left;  2.5 hrs    Current Medications: Outpatient Medications Prior to Visit  Medication Sig Dispense Refill  . acetaminophen (TYLENOL) 500 MG tablet Take 500 mg by mouth every 6 (six) hours as needed for mild pain.    [name] allopurinol (ZYLOPRIM) 100 MG tablet Take 100 mg by mouth 2 (two) times daily.     . carvedilol (COREG) 25 MG tablet Take 1 tablet (25 mg total) by mouth 2 (two) times daily with a meal. 180 tablet 3  . Cholecalciferol (VITAMIN D-3) 5000 units TABS Take 5,000 Units by mouth daily.     . colchicine 0.6 MG tablet Take 0.6 mg by mouth as needed.   1  . diphenhydrAMINE (BENADRYL) 25 MG tablet Take 25 mg by mouth daily.    [name] ELIQUIS 5 MG TABS tablet TAKE 1 TABLET TWICE DAILY 180 tablet 6  . ferrous sulfate 325 (65 FE) MG tablet Take 325 mg by mouth daily with breakfast.    . furosemide (LASIX) 40 MG tablet Take 2 tablets (80 mg total) by mouth daily. 11/06/2019 Per pt the nephrologist increased Furosemide to 80 mg every AM and 40 mg every afternoon.  She may also take additional 40 mg in the afternoon for lower extremity swelling.    . hydrALAZINE (APRESOLINE) 10 MG tablet TAKE 1 TABLET THREE TIMES DAILY 270 tablet 3  . isosorbide mononitrate (IMDUR) 30 MG 24 hr tablet TAKE 1 TABLET EVERY DAY 90  tablet 3  . rosuvastatin (CRESTOR) 10 MG tablet Take 1 tablet (10 mg total) by mouth daily. 90 tablet 3  . TURMERIC PO Take 1,000 mg by mouth daily.    . vitamin B-12 (CYANOCOBALAMIN) 1000 MCG tablet Take 1,000 mcg by mouth daily.    . vitamin E 400 UNIT capsule Take 400 Units by mouth daily.     No facility-administered medications prior to visit.     Allergies:   Patient has no known allergies.   Social History   Socioeconomic History  . Marital status: Divorced    Spouse name: Not on file  . Number of children: 2  . Years of education: 85  . Highest education level: Not on file  Occupational History  . Occupation: retired  Tobacco Use  . Smoking status: Never Smoker  . Smokeless tobacco: Never Used  . Tobacco comment: Lives alone-divorced  Vaping Use  . Vaping Use: Never used  Substance and Sexual Activity  . Alcohol use: Yes    Alcohol/week: 0.0 standard drinks    Comment: occ  . Drug use: No  . Sexual activity: Not on file  Other Topics Concern  . Not on file  Social History Narrative  . Not on file   Social Determinants of Health   Financial Resource Strain:   . Difficulty of Paying Living Expenses: Not on file  Food Insecurity:   . Worried About Charity fundraiser in the Last Year: Not on file  . Ran Out of Food in the Last Year: Not on file  Transportation Needs:   . Lack of Transportation (Medical): Not on file  . Lack of Transportation (Non-Medical): Not on file  Physical Activity:   . Days of Exercise per Week: Not on file  . Minutes of Exercise per Session: Not on file  Stress:   . Feeling of Stress : Not on file  Social Connections:   . Frequency of Communication with Friends and Family: Not on file  . Frequency of Social Gatherings with Friends and Family: Not on file  . Attends Religious Services: Not on file  . Active Member of Clubs or Organizations: Not on file  . Attends Archivist Meetings: Not on file  . Marital Status: Not on  file     Family History:  The patient's family history includes Asthma in her father; Diabetes in her sister; Parkinson's disease in her mother. Her sister had long QT syndrome, but no documented ventricular arrhythmia  ROS:   Please see the history of present illness.    All other systems are reviewed and are negative.   PHYSICAL EXAM:   VS:  BP 126/61   Pulse 74   Ht 5\' 2"  (1.575 m)   Wt 207 lb 9.6 oz (94.2 kg)   BMI 37.97 kg/m      General: Alert, oriented x3, no distress, severely obese. Healthy L subclavian ICD site. Head: no evidence of trauma, PERRL, EOMI, no exophtalmos or lid lag, no myxedema, no xanthelasma; normal ears, nose and oropharynx Neck: normal jugular venous pulsations and no hepatojugular reflux; brisk carotid pulses without delay and no carotid bruits Chest: clear to auscultation, no signs of consolidation by percussion or palpation, normal fremitus, symmetrical and full respiratory excursions Cardiovascular: normal position and quality of the apical impulse, regular rhythm, normal first and second heart sounds, no murmurs, rubs or gallops Abdomen: no tenderness or distention, no masses by palpation, no abnormal pulsatility or arterial bruits, normal bowel sounds, no hepatosplenomegaly Extremities: no clubbing, cyanosis, trivial ankle edema; 2+ radial, ulnar and brachial pulses bilaterally; 2+ right femoral, posterior tibial and dorsalis pedis pulses; 2+ left femoral, posterior tibial and dorsalis pedis pulses; no subclavian or femoral bruits Neurological: grossly nonfocal Psych: Normal mood and affect   Wt Readings from Last 3 Encounters:  03/23/20 207 lb 9.6 oz (94.2 kg)  09/23/19 203 lb 6.4 oz (92.3 kg)  08/12/19 208 lb 6.4 oz (94.5 kg)      Studies/Labs Reviewed:   EKG:  EKG is ordered today and shows sinus rhythm with 1st deg AVB, a single PVC, LVH with mild QRS broadening at 106 ms, QTc 479 ms. Recent Labs: 04/15/2019: ALT 11; Hemoglobin 10.8;  Platelet Count 138 08/12/2019: BUN 43; Creatinine, Ser 2.56; NT-Pro BNP 7,259; Potassium 4.0; Sodium 141   03/16/2020 Dr. [name] Creat 2.91, K 4.8, Hgb 11.9 Chol 172, HDL 46, LDL 91, TG 106 Hgb A1c 6.2%.  Lipid Panel    Component Value Date/Time   CHOL 184 09/13/2012 1357   TRIG 142 09/13/2012 1357   HDL 45 09/13/2012 1357   CHOLHDL 4.1 09/13/2012 1357   VLDL 28 09/13/2012 1357   LDLCALC 111 (H) 09/13/2012 1357   LDLDIRECT 141.3 10/14/2008 1004   ASSESSMENT:    1. Chronic combined systolic and diastolic CHF (congestive heart failure) ([place])   2. Non-ischemic cardiomyopathy ([place])   3. Long QT interval   4. ICD (implantable cardioverter-defibrillator) in place   5. History of venous thromboembolism   6. Long term current use of anticoagulant   7. Benign essential HTN   8. OSA (obstructive sleep apnea)   9. CKD (chronic kidney disease) stage 4, GFR 15-29 ml/min (HCC)   10. Severe obesity (BMI 35.0-35.9 with comorbidity) ([place])   11. Diabetes mellitus type 2 in obese [facility])    PLAN:  In order of problems listed above:  1. CHF: Appears euvolemic,  NYHA functional class I.  She is on maximum dose carvedilol.  Unable to prescribe RAAS inhibitors due to renal dysfunction, on hydralazine/nitrates vasodilator therapy. May be able to decrease diuretic dose slightly if Dr. Moshe Cipro recommends it. 2. CMP: Nonischemic mechanism.  Normal coronary arteries a couple of years ago, by angiography.  Possibly received Adriamycin for chemotherapy for breast cancer in 1994.  Might also have some familial cardiomyopathy. 3. Long QT: Her sister had long QT syndrome.  Patricia Morgan's QT interval has always been mildly prolonged generally around 460-480 ms, on one occasion up to 490.  Similar range today.  Potassium was normal. 4. ICD: Normal device function.  Continue remote downloads every 3 months for comprehensive checks as well as monthly thoracic impedance/OptiVol downloads. 5. Recurrent DVT/PE: On  lifelong anticoagulation for hypercoagulable state due to heterozygous factor V Leiden.  6. Eliquis: Denies any bleeding problems. 7. HTN: Excellent control 8. OSA: Using CPAP faithfully 100% of the time and feels great benefit from its use.  Followed by Dr. Elsworth Soho. 9. CKD 4: Creatinine has been inching up slowly over the last year.  Now up to 2.91. Sees Dr. Moshe Cipro today.   GFR approximately 15-20. 10. Obesity: Weight has stabilized in the severely obese range 11. DM: Hemoglobin A1c 6.2%, well controlled, but up slightly.   Medication Adjustments/Labs and Tests Ordered: Current medicines are reviewed at length with the patient today.  Concerns regarding medicines are outlined above.  Medication changes, Labs and Tests ordered today are listed in the Patient Instructions below. Patient Instructions  Medication Instructions:  No changes *If you need a refill on your cardiac medications before your next appointment, please call your pharmacy*   Lab Work: None ordered If you have labs (blood work) drawn today and your tests are completely normal, you will receive your results only by: [name] MyChart Message (if you have MyChart) OR . A paper copy in the mail If you have any lab test that is abnormal or we need to change your treatment, we will call you to review the results.   Testing/Procedures: None ordered   Follow-Up: At [facility], you and your health needs are our priority.  As part of our continuing mission to provide you with exceptional heart care, we have created designated Provider Care Teams.  These Care Teams include your primary Cardiologist (physician) and Advanced Practice Providers (APPs -  Physician Assistants and Nurse Practitioners) who all work together to provide you with the care you need, when you need it.  We  recommend signing up for the patient portal called "MyChart".  Sign up information is provided on this After Visit Summary.  MyChart is used to connect  with patients for Virtual Visits (Telemedicine).  Patients are able to view lab/test results, encounter notes, upcoming appointments, etc.  Non-urgent messages can be sent to your provider as well.   To learn more about what you can do with MyChart, go to [email or web address].    Your next appointment:   6 month(s)  The format for your next appointment:   In Person  Provider:   Sanda Klein, MD      Signed, Sanda Klein, MD  03/23/2020 8:42 AM    [facility] [place], [place], [place]  [address] Phone: [phone]; Fax: [phone]

## 2020-04-06 ENCOUNTER — Ambulatory Visit (INDEPENDENT_AMBULATORY_CARE_PROVIDER_SITE_OTHER): Payer: Medicare Other

## 2020-04-06 DIAGNOSIS — I428 Other cardiomyopathies: Secondary | ICD-10-CM

## 2020-04-06 LAB — CUP PACEART REMOTE DEVICE CHECK
Battery Remaining Longevity: 124 mo
Battery Voltage: 3.02 V
Brady Statistic RV Percent Paced: 0.01 %
Date Time Interrogation Session: 20211122072409
HighPow Impedance: 78 Ohm
Implantable Lead Implant Date: 20191030
Implantable Lead Location: 753860
Implantable Pulse Generator Implant Date: 20191030
Lead Channel Impedance Value: 342 Ohm
Lead Channel Impedance Value: 418 Ohm
Lead Channel Pacing Threshold Amplitude: 0.625 V
Lead Channel Pacing Threshold Pulse Width: 0.4 ms
Lead Channel Sensing Intrinsic Amplitude: 26.5 mV
Lead Channel Sensing Intrinsic Amplitude: 26.5 mV
Lead Channel Setting Pacing Amplitude: 2 V
Lead Channel Setting Pacing Pulse Width: 0.4 ms
Lead Channel Setting Sensing Sensitivity: 0.3 mV

## 2020-04-07 ENCOUNTER — Ambulatory Visit (INDEPENDENT_AMBULATORY_CARE_PROVIDER_SITE_OTHER): Payer: Medicare Other

## 2020-04-07 DIAGNOSIS — Z95810 Presence of automatic (implantable) cardiac defibrillator: Secondary | ICD-10-CM | POA: Diagnosis not present

## 2020-04-07 DIAGNOSIS — I5042 Chronic combined systolic (congestive) and diastolic (congestive) heart failure: Secondary | ICD-10-CM

## 2020-04-07 NOTE — Progress Notes (Signed)
EPIC Encounter for ICM Monitoring  Patient Name: Patricia Morgan is a 69 y.o. female Date: 04/07/2020 Primary Care Physican: Antony Contras, MD Primary Cardiologist:Croitoru Electrophysiologist:Croitoru Nephrologist:[place] 03/23/2020 OfficeWeight:207lbs   Transmission Reviewed.  OptivolThoracic impedancenormal.  Prescribed: Furosemide40 mgtake 2tablets(80 mg)by mouth dailyand 40 mg in the afternoon.Per patient, Dr Moshe Cipro has instructed heranotherextra 40 mg if she has lower extremity swelling.  Labs: 08/12/2019 Creatinine 2.56, BUN 43, Potassium 4.0, Sodium 141, GFR 18-21 A complete set of results can be found in Results Review.  Recommendations: No changes    Follow-up plan: ICM clinic phone appointment on 05/11/2020. 91 day device clinic remote transmission2/21/2022.  EP/Cardiology Office Visits: Recall for 09/19/2020 with Dr. Sallyanne Kuster.    Copy of ICM check sent to Dr. Sallyanne Kuster.   3 month ICM trend: 04/06/2020    1 Year ICM trend:       Patricia Billings, RN 04/07/2020 8:23 AM

## 2020-04-08 NOTE — Progress Notes (Signed)
Remote ICD transmission.   

## 2020-05-11 ENCOUNTER — Ambulatory Visit (INDEPENDENT_AMBULATORY_CARE_PROVIDER_SITE_OTHER): Payer: Medicare Other

## 2020-05-11 DIAGNOSIS — I5042 Chronic combined systolic (congestive) and diastolic (congestive) heart failure: Secondary | ICD-10-CM | POA: Diagnosis not present

## 2020-05-11 DIAGNOSIS — Z95810 Presence of automatic (implantable) cardiac defibrillator: Secondary | ICD-10-CM

## 2020-05-13 NOTE — Progress Notes (Signed)
EPIC Encounter for ICM Monitoring  Patient Name: Tamara Monteith is a 69 y.o. female Date: 05/13/2020 Primary Care Physican: Antony Contras, MD Primary Cardiologist:Croitoru Electrophysiologist:Croitoru Nephrologist:[place] 05/13/2020 Weight:198lbs   Spoke with patient and reports feeling well at this time.  Denies fluid symptoms.    OptivolThoracic impedancenormal.  Prescribed: Furosemide40 mgtake 2tablets(80 mg)by mouth dailyand 40 mg in the afternoon.Per patient, Dr Moshe Cipro has instructed heranotherextra 40 mg if she has lower extremity swelling.  Labs: 08/12/2019 Creatinine2.56, BUN43, Potassium4.0, Sodium141, [id number] A complete set of results can be found in Results Review.  Recommendations: No changes and encouraged to call if experiencing any fluid symptoms.  Follow-up plan: ICM clinic phone appointment on 06/16/2020. 91 day device clinic remote transmission2/21/2022.  EP/Cardiology Office Visits:Recall for 09/19/2020 with Dr.Croitoru.   Copy of ICM check sent to Dr.Croitoru.  3 month ICM trend: 05/11/2020    1 Year ICM trend:       Rosalene Billings, RN 05/13/2020 12:37 PM

## 2020-05-13 NOTE — Progress Notes (Signed)
Thanks

## 2020-05-28 ENCOUNTER — Telehealth: Payer: Self-pay | Admitting: Cardiovascular Disease

## 2020-05-28 NOTE — Telephone Encounter (Signed)
Pt c/o BP issue: STAT if pt c/o blurred vision, one-sided weakness or slurred speech  1. What are your last 5 BP readings? Systolic readings last week were ranging in the 737'T but her diastolic readings were ranging between 54-66.  2. Are you having any other symptoms (ex. Dizziness, headache, blurred vision, passed out)? sluggish  3. What is your BP issue? Patient really concerned with her diastolic BP readings from last week. She states that this week they are a bit better with her diastolic pressure running in the 70's. She wants to make sure she is not taking too much medication. She says she is taking Hydralazine 3x daily and isosorbide 1x daily. She wants to know if she should be on both of these. Please advise.

## 2020-05-28 NOTE — Telephone Encounter (Signed)
Left message to call back  

## 2020-06-01 ENCOUNTER — Other Ambulatory Visit: Payer: Self-pay

## 2020-06-01 DIAGNOSIS — E7800 Pure hypercholesterolemia, unspecified: Secondary | ICD-10-CM | POA: Diagnosis not present

## 2020-06-01 DIAGNOSIS — I272 Pulmonary hypertension, unspecified: Secondary | ICD-10-CM | POA: Diagnosis not present

## 2020-06-01 DIAGNOSIS — N183 Chronic kidney disease, stage 3 unspecified: Secondary | ICD-10-CM | POA: Diagnosis not present

## 2020-06-01 DIAGNOSIS — E1122 Type 2 diabetes mellitus with diabetic chronic kidney disease: Secondary | ICD-10-CM | POA: Diagnosis not present

## 2020-06-01 DIAGNOSIS — I10 Essential (primary) hypertension: Secondary | ICD-10-CM | POA: Diagnosis not present

## 2020-06-01 DIAGNOSIS — C50911 Malignant neoplasm of unspecified site of right female breast: Secondary | ICD-10-CM | POA: Diagnosis not present

## 2020-06-01 DIAGNOSIS — N184 Chronic kidney disease, stage 4 (severe): Secondary | ICD-10-CM | POA: Diagnosis not present

## 2020-06-01 MED ORDER — CARVEDILOL 25 MG PO TABS: 12.5000 mg | ORAL_TABLET | Freq: Two times a day (BID) | ORAL | 3 refills | Status: DC

## 2020-06-01 NOTE — Telephone Encounter (Signed)
Called patient,. She states that last week her PCP changed her carvedilol to 12.5 twice a day, and it has helped her BP- it is now running 128-130\70-72, and she feels better.   I advised I would let MD know, and to monitor her BP and if any concerns with BP came up to let us know.  Patient verbalized understanding.   Will update med list.

## 2020-06-01 NOTE — Telephone Encounter (Signed)
That's fine. Thank you for follow up.

## 2020-06-14 DIAGNOSIS — I129 Hypertensive chronic kidney disease with stage 1 through stage 4 chronic kidney disease, or unspecified chronic kidney disease: Secondary | ICD-10-CM | POA: Diagnosis not present

## 2020-06-15 DIAGNOSIS — I129 Hypertensive chronic kidney disease with stage 1 through stage 4 chronic kidney disease, or unspecified chronic kidney disease: Secondary | ICD-10-CM | POA: Diagnosis not present

## 2020-06-16 ENCOUNTER — Ambulatory Visit (INDEPENDENT_AMBULATORY_CARE_PROVIDER_SITE_OTHER): Payer: Medicare Other

## 2020-06-16 DIAGNOSIS — I5042 Chronic combined systolic (congestive) and diastolic (congestive) heart failure: Secondary | ICD-10-CM | POA: Diagnosis not present

## 2020-06-16 DIAGNOSIS — Z95810 Presence of automatic (implantable) cardiac defibrillator: Secondary | ICD-10-CM | POA: Diagnosis not present

## 2020-06-18 DIAGNOSIS — H25041 Posterior subcapsular polar age-related cataract, right eye: Secondary | ICD-10-CM | POA: Diagnosis not present

## 2020-06-18 DIAGNOSIS — H25811 Combined forms of age-related cataract, right eye: Secondary | ICD-10-CM | POA: Diagnosis not present

## 2020-06-18 DIAGNOSIS — H2511 Age-related nuclear cataract, right eye: Secondary | ICD-10-CM | POA: Diagnosis not present

## 2020-06-22 NOTE — Progress Notes (Signed)
EPIC Encounter for ICM Monitoring  Patient Name: Patricia Morgan is a 70 y.o. female Date: 06/22/2020 Primary Care Physican: Antony Contras, MD Primary Cardiologist:Croitoru Electrophysiologist:Croitoru Nephrologist:[place] 06/22/2020 Weight:202lbs   Spoke with patient and she denies any fluid symptoms.  She reports she has cut the Furosemide dosage to 40 mg a day which was discussed with nephrologist. She is drinking 48-54 oz daily and will check how much salt she is getting in her diet.  She normally is very conscious in how much salt she is taking in but does eat a lot of cheese which is high in salt.  OptivolThoracic impedancenormal but was suggesting possible fluid accumulation from 05/28/2020 - 06/05/2020 and 06/08/2020 - 06/16/2020  Prescribed: Furosemide40 mgtake 2tablets(80 mg)by mouth dailyand 40 mg in the afternoon.Per patient, Dr Moshe Cipro has instructed heranotherextra 40 mg if she has lower extremity swelling.  Labs: 08/12/2019 Creatinine2.56, BUN43, Potassium4.0, Sodium141, [id number] A complete set of results can be found in Results Review.  Recommendations: She will discuss diet, fluid intake and Furosemide dosage with Dr Moshe Cipro, nephrologist at tomorrow's, 06/23/2020, appointment  Follow-up plan: ICM clinic phone appointment on 07/20/2020. 91 day device clinic remote transmission2/21/2022.  EP/Cardiology Office Visits:Recall for 09/19/2020 with Dr.Croitoru.   Copy of ICM check sent to Dr.Croitoru.   3 month ICM trend: 06/16/2020.    1 Year ICM trend:       Rosalene Billings, RN 06/22/2020 12:43 PM

## 2020-06-22 NOTE — Progress Notes (Signed)
So far seems to be doing well with a lower dose of furosemide.  Continue to monitor.

## 2020-06-23 DIAGNOSIS — I129 Hypertensive chronic kidney disease with stage 1 through stage 4 chronic kidney disease, or unspecified chronic kidney disease: Secondary | ICD-10-CM | POA: Diagnosis not present

## 2020-06-23 DIAGNOSIS — E1122 Type 2 diabetes mellitus with diabetic chronic kidney disease: Secondary | ICD-10-CM | POA: Diagnosis not present

## 2020-06-23 DIAGNOSIS — N183 Chronic kidney disease, stage 3 unspecified: Secondary | ICD-10-CM | POA: Diagnosis not present

## 2020-06-23 DIAGNOSIS — M109 Gout, unspecified: Secondary | ICD-10-CM | POA: Diagnosis not present

## 2020-06-23 DIAGNOSIS — D509 Iron deficiency anemia, unspecified: Secondary | ICD-10-CM | POA: Diagnosis not present

## 2020-06-24 ENCOUNTER — Telehealth: Payer: Self-pay | Admitting: Cardiovascular Disease

## 2020-06-24 MED ORDER — CARVEDILOL 25 MG PO TABS: 12.5000 mg | ORAL_TABLET | Freq: Two times a day (BID) | ORAL | 3 refills | Status: DC

## 2020-06-24 NOTE — Telephone Encounter (Signed)
*  STAT* If patient is at the pharmacy, call can be transferred to refill team.   1. Which medications need to be refilled? (please list name of each medication and dose if known)  carvedilol (COREG) 25 MG tablet   2. Which pharmacy/location (including street and city if local pharmacy) is medication to be sent to? [facility], [place], [address]  3. Do they need a 30 day or 90 day supply? [place]

## 2020-07-01 DIAGNOSIS — G4733 Obstructive sleep apnea (adult) (pediatric): Secondary | ICD-10-CM | POA: Diagnosis not present

## 2020-07-06 ENCOUNTER — Ambulatory Visit (INDEPENDENT_AMBULATORY_CARE_PROVIDER_SITE_OTHER): Payer: Medicare Other

## 2020-07-06 DIAGNOSIS — I428 Other cardiomyopathies: Secondary | ICD-10-CM | POA: Diagnosis not present

## 2020-07-07 LAB — CUP PACEART REMOTE DEVICE CHECK
Battery Remaining Longevity: 122 mo
Battery Voltage: 3.01 V
Brady Statistic RV Percent Paced: 0.01 %
Date Time Interrogation Session: 20220221031609
HighPow Impedance: 75 Ohm
Implantable Lead Implant Date: 20191030
Implantable Lead Location: 753860
Implantable Pulse Generator Implant Date: 20191030
Lead Channel Impedance Value: 342 Ohm
Lead Channel Impedance Value: 399 Ohm
Lead Channel Pacing Threshold Amplitude: 0.75 V
Lead Channel Pacing Threshold Pulse Width: 0.4 ms
Lead Channel Sensing Intrinsic Amplitude: 23.875 mV
Lead Channel Sensing Intrinsic Amplitude: 23.875 mV
Lead Channel Setting Pacing Amplitude: 2 V
Lead Channel Setting Pacing Pulse Width: 0.4 ms
Lead Channel Setting Sensing Sensitivity: 0.3 mV

## 2020-07-09 DIAGNOSIS — H2512 Age-related nuclear cataract, left eye: Secondary | ICD-10-CM | POA: Diagnosis not present

## 2020-07-09 DIAGNOSIS — H25812 Combined forms of age-related cataract, left eye: Secondary | ICD-10-CM | POA: Diagnosis not present

## 2020-07-09 DIAGNOSIS — H25042 Posterior subcapsular polar age-related cataract, left eye: Secondary | ICD-10-CM | POA: Diagnosis not present

## 2020-07-09 DIAGNOSIS — Z961 Presence of intraocular lens: Secondary | ICD-10-CM | POA: Diagnosis not present

## 2020-07-13 DIAGNOSIS — H60333 Swimmer's ear, bilateral: Secondary | ICD-10-CM | POA: Diagnosis not present

## 2020-07-13 DIAGNOSIS — L299 Pruritus, unspecified: Secondary | ICD-10-CM | POA: Diagnosis not present

## 2020-07-14 NOTE — Progress Notes (Signed)
Remote ICD transmission.   

## 2020-07-15 ENCOUNTER — Telehealth: Payer: Self-pay

## 2020-07-15 NOTE — Telephone Encounter (Signed)
Spoke with patient.  She reports feeling well other than the 4 lbs weight gain.  Current weight is 206 lbs which is an increase from 202 lbs.  She confirmed Furosemide dosage is 40 mg alternating with 80 mg every other day.  She is sending remote transmission this AM to check fluid levels.  She would like to make sure the 4 lb weight increase is not fluid weight.  She reports total fluid intake is 64 oz daily.  Will call her back when transmission is received and reviewed.

## 2020-07-15 NOTE — Telephone Encounter (Signed)
*  STAT* If patient is at the pharmacy, call can be transferred to refill team.   1. Which medications need to be refilled? (please list name of each medication and dose if known) carvedilol (COREG) 25 MG tablet  2. Which pharmacy/location (including street and city if local pharmacy) is medication to be sent to? [facility] - [place], [place] - [address], [address]  3. Do they need a 30 day or 90 day supply? 90 day supply  Patient is following up regarding her refill request on 06/24/20. She states, per [facility], they have not received her Rx. Although, it looks like the receipt was confirmed on 06/24/20. She would like to know if we can resubmit it.

## 2020-07-15 NOTE — Telephone Encounter (Signed)
Received voice mail message from patient.  She spoke with nephrologist regarding diuretic.  She was instructed to take Furosemide 1 tablet every other day alternating with 2 tablets every other day.  Patient has increased water intake but has gained 4 lbs.  She reports breathing is at baseline.  She requested a call back.

## 2020-07-15 NOTE — Telephone Encounter (Signed)
Spoke with patient.  Advised have not received the transmission she was sending this AM.  Assisted in trying to send remote transmission but she received [id number] error code.  Provided [facility] tech support number and advised to call to get assistance with monitor error. She will call back once she is able to send remote transmission or provide update from [place].

## 2020-07-16 MED ORDER — CARVEDILOL 25 MG PO TABS: 12.5000 mg | ORAL_TABLET | Freq: Two times a day (BID) | ORAL | 3 refills | Status: DC

## 2020-07-16 NOTE — Telephone Encounter (Signed)
Refill sent to the pharmacy electronically.  

## 2020-07-16 NOTE — Addendum Note (Signed)
Addended by: Cristopher Estimable on: 07/16/2020 09:08 AM   Modules accepted: Orders

## 2020-07-20 ENCOUNTER — Ambulatory Visit (INDEPENDENT_AMBULATORY_CARE_PROVIDER_SITE_OTHER): Payer: Medicare Other

## 2020-07-20 DIAGNOSIS — I5042 Chronic combined systolic (congestive) and diastolic (congestive) heart failure: Secondary | ICD-10-CM

## 2020-07-20 DIAGNOSIS — Z95810 Presence of automatic (implantable) cardiac defibrillator: Secondary | ICD-10-CM

## 2020-07-22 NOTE — Progress Notes (Signed)
EPIC Encounter for ICM Monitoring  Patient Name: Patricia Morgan is a 70 y.o. female Date: 07/22/2020 Primary Care Physican: Antony Contras, MD Primary Cardiologist:Croitoru Electrophysiologist:Croitoru Nephrologist:[place] 07/22/2020 Weight:206lbs   Spoke with patient and she denies any fluid symptoms.  She said her weight is up but she thinks it is caloric weight.  OptivolThoracic impedancenormal.  Prescribed: Furosemide40 mgtake 2tablets(80 mg)by mouth dailyand 40 mg in the afternoon.Per patient, Dr Moshe Cipro has instructed heranotherextra 40 mg if she has lower extremity swelling.  Labs: 08/12/2019 Creatinine2.56, BUN43, Potassium4.0, Sodium141, [id number] A complete set of results can be found in Results Review.  Recommendations: No changes and encouraged to call if experiencing any fluid symptoms.  Follow-up plan: ICM clinic phone appointment on4/03/2021. 91 day device clinic remote transmission5/23/2022.  EP/Cardiology Office Visits:4/4/2022with Dr.Croitoru.   Copy of ICM check sent to Dr.Croitoru.  3 month ICM trend: 07/22/2020.    1 Year ICM trend:       Rosalene Billings, RN 07/22/2020 12:40 PM

## 2020-07-23 NOTE — Progress Notes (Signed)
Thank you :)

## 2020-08-17 ENCOUNTER — Ambulatory Visit (INDEPENDENT_AMBULATORY_CARE_PROVIDER_SITE_OTHER): Payer: Medicare Other | Admitting: Cardiovascular Disease

## 2020-08-17 ENCOUNTER — Other Ambulatory Visit: Payer: Self-pay

## 2020-08-17 VITALS — BP 128/66 | HR 77 | Ht 62.0 in | Wt 209.4 lb

## 2020-08-17 DIAGNOSIS — Z7901 Long term (current) use of anticoagulants: Secondary | ICD-10-CM

## 2020-08-17 DIAGNOSIS — D6851 Activated protein C resistance: Secondary | ICD-10-CM

## 2020-08-17 DIAGNOSIS — N184 Chronic kidney disease, stage 4 (severe): Secondary | ICD-10-CM

## 2020-08-17 DIAGNOSIS — I5042 Chronic combined systolic (congestive) and diastolic (congestive) heart failure: Secondary | ICD-10-CM

## 2020-08-17 DIAGNOSIS — E1169 Type 2 diabetes mellitus with other specified complication: Secondary | ICD-10-CM | POA: Diagnosis not present

## 2020-08-17 DIAGNOSIS — E669 Obesity, unspecified: Secondary | ICD-10-CM

## 2020-08-17 DIAGNOSIS — I428 Other cardiomyopathies: Secondary | ICD-10-CM

## 2020-08-17 DIAGNOSIS — Z95810 Presence of automatic (implantable) cardiac defibrillator: Secondary | ICD-10-CM

## 2020-08-17 DIAGNOSIS — R9431 Abnormal electrocardiogram [ECG] [EKG]: Secondary | ICD-10-CM

## 2020-08-17 DIAGNOSIS — I10 Essential (primary) hypertension: Secondary | ICD-10-CM | POA: Diagnosis not present

## 2020-08-17 DIAGNOSIS — Z6835 Body mass index (BMI) 35.0-35.9, adult: Secondary | ICD-10-CM

## 2020-08-17 DIAGNOSIS — G4733 Obstructive sleep apnea (adult) (pediatric): Secondary | ICD-10-CM

## 2020-08-17 LAB — BASIC METABOLIC PANEL
BUN/Creatinine Ratio: 15 (ref 12–28)
BUN: 39 mg/dL — ABNORMAL HIGH (ref 8–27)
CO2: 20 mmol/L (ref 20–29)
Calcium: 9.6 mg/dL (ref 8.7–10.3)
Chloride: 107 mmol/L — ABNORMAL HIGH (ref 96–106)
Creatinine, Ser: 2.63 mg/dL — ABNORMAL HIGH (ref 0.57–1.00)
Glucose: 117 mg/dL — ABNORMAL HIGH (ref 65–99)
Potassium: 5 mmol/L (ref 3.5–5.2)
Sodium: 141 mmol/L (ref 134–144)
eGFR: 19 mL/min/{1.73_m2} — ABNORMAL LOW (ref >59)

## 2020-08-17 LAB — PACEMAKER DEVICE OBSERVATION

## 2020-08-17 NOTE — Progress Notes (Signed)
Cardiology Office Note    Date:  08/20/2020   ID:  Patricia Morgan, DOB 1951/04/29, MRN [id number]  PCP:  Antony Contras, MD  Cardiologist:   Sanda Klein, MD   Chief Complaint  Patient presents with  . Congestive Heart Failure    History of Present Illness:  Patricia Morgan is a 70 y.o. 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  Overall she is doing quite well.  She has to climb to her second floor apartment several times a day and does not have problems with dyspnea at that level of exercise.  She does have a 2+ swelling of the lower extremities.  She is taking a total of 80 mg of furosemide twice a day, but she has not required any extra doses of diuretics.  She has been quite steady on her OptiVol for the last 8 months.  Unfortunately renal function is moderately abnormal with a creatinine around 2.5-3 (most recent previous creatinine March 16, 2020 was 2.92, rechecked in the office today 2.63).  Dr. Moshe Cipro has referred her to dialysis classes in anticipation of future need for dialysis.  Denies dizziness, palpitations, syncope, angina, orthopnea or PND.  She has not had defibrillator discharges.  She has not had a gout attack in the last 3 years or so.  Her weight is slightly higher at 209 pounds (roughly 4-6 pounds above previous baseline).  Her office weight is about 7 pounds higher than her home weight due to her heavy breast prosthesis.  Interrogation of her defibrillator shows normal device function.  Estimated longevity is about 10 years.  OptiVol has been very steady.  She does not require ventricular pacing and the device has not detected ventricular tachycardia or atrial fibrillation.  Activity  level is about 0.9 hours/day, slight improvement.  Her most recent echo performed in October 2019 showed severely decreased left ventricular systolic function with an ejection fraction of 25%.  A nuclear stress test was performed and shows a fixed inferior wall defect that I suspect is diaphragmatic attenuation artifact.  There was no reversible ischemia.  A similar EF of 22% was reported.  She is compliant with carvedilol in the maximum usual dose and also takes hydralazine/nitrates at the highest dose tolerated by her blood pressure.  She is not receiving RAAS inhibitors due to chronic kidney disease.  Coronary angiography performed June 2017 showed no evidence of coronary artery disease but confirmed elevated filling pressure. PA pressure was 57/18 in the setting of a mean pulmonary wedge pressure of 33 mmHg. Echo showed ejection fraction of 45%. Nuclear scintigraphy calculated ejection fraction of 38%. It also suggested the presence of perfusion abnormalities, false positive by coronary angiography. She sees Dr. Moshe Cipro for chronic kidney disease and her baseline creatinine is around 2.0-2.5. Her primary care physician is Dr. Antony Contras. Dr. Elsworth Soho manages her sleep apnea treatment.  Her sister [name] had coronary artery disease and long QT syndrome and passed away in 12-Aug-2017.  Past Medical History:  Diagnosis Date  . ANEMIA-NOS   . BREAST CANCER, HX OF 1994 & 1995   1994>>recurrence in Aug 12, 1993, R breast, s/p lumpectomy>>mastectomy  . Cancer ([facility])   . Cardiomyopathy, nonischemic ([facility])   . CHF (congestive heart failure) (H[facility])    chronic systolic and diastolic CHF  . CHRONIC KIDNEY DISEASE STAGE III (MODERATE)   .  Clotting disorder ([facility])    on eliquis for Factor 5 disorder  . Colon polyps   . DIABETES MELLITUS, TYPE II   . Diverticulosis   . HYPERLIPIDEMIA   . HYPERTENSION   . ILD (interstitial lung disease) ([place])    10/2018 CT findings suggestive of sarcoidosis; followed by Dr. Elsworth Soho   . OBSTRUCTIVE SLEEP APNEA    CPAP  . Personal history of chemotherapy   . Proteinuria   . PULMONARY EMBOLISM 09/24/2008   anticoag thru 03/2010  . Rotator cuff tear    Left  . Shoulder impingement, left   . Sleep apnea    Wears CPAP nightly    Past Surgical History:  Procedure Laterality Date  . BREAST SURGERY    . CARDIAC CATHETERIZATION N/A 11/02/2015   Procedure: Right/Left Heart Cath and Coronary Angiography;  Surgeon: Burnell Blanks, MD;  Location: [facility];  Service: Cardiovascular;  Laterality: N/A;  . DILATION AND CURETTAGE OF UTERUS    . ICD IMPLANT N/A 03/14/2018   Procedure: ICD IMPLANT;  Surgeon: Sanda Klein, MD;  Location: [facility];  Service: Cardiovascular;  Laterality: N/A;  . insertion port a cath    . MASTECTOMY Right 1995   Right  . PORT-A-CATH REMOVAL    . SHOULDER ARTHROSCOPY WITH ROTATOR CUFF REPAIR AND SUBACROMIAL DECOMPRESSION Left 01/24/2019   Procedure: SHOULDER ARTHROSCOPY WITH ROTATOR CUFF REPAIR AND SUBACROMIAL DECOMPRESSION;  Surgeon: Nicholes Stairs, MD;  Location: [facility];  Service: Orthopedics;  Laterality: Left;  2.5 hrs    Current Medications: Outpatient Medications Prior to Visit  Medication Sig Dispense Refill  . acetaminophen (TYLENOL) 500 MG tablet Take 500 mg by mouth every 6 (six) hours as needed for mild pain.    [name] allopurinol (ZYLOPRIM) 100 MG tablet Take 100 mg by mouth 2 (two) times daily.     . carvedilol (COREG) 25 MG tablet Take 0.5 tablets (12.5 mg total) by mouth 2 (two) times daily with a meal. 180 tablet 3  . Cholecalciferol (VITAMIN D-3) 5000 units TABS Take 5,000 Units by mouth daily.     . colchicine 0.6 MG tablet Take 0.6 mg by mouth as needed.   1  . diphenhydrAMINE (BENADRYL) 25 MG tablet Take 25 mg by mouth daily.    [name] ELIQUIS 5 MG TABS tablet TAKE 1 TABLET TWICE DAILY 180 tablet 6  . ferrous sulfate 325 (65 FE) MG tablet Take 325 mg by mouth daily with breakfast.    . furosemide  (LASIX) 40 MG tablet Take 2 tablets (80 mg total) by mouth daily. 11/06/2019 Per pt the nephrologist increased Furosemide to 80 mg every AM and 40 mg every afternoon.  She may also take additional 40 mg in the afternoon for lower extremity swelling.    . hydrALAZINE (APRESOLINE) 10 MG tablet TAKE 1 TABLET THREE TIMES DAILY 270 tablet 3  . isosorbide mononitrate (IMDUR) 30 MG 24 hr tablet TAKE 1 TABLET EVERY DAY 90 tablet 3  . rosuvastatin (CRESTOR) 10 MG tablet Take 1 tablet (10 mg total) by mouth daily. 90 tablet 3  . TURMERIC PO Take 1,000 mg by mouth daily.    . vitamin B-12 (CYANOCOBALAMIN) 1000 MCG tablet Take 1,000 mcg by mouth daily.    . vitamin E 400 UNIT capsule Take 400 Units by mouth daily.     No facility-administered medications prior to visit.     Allergies:   Patient has no known allergies.   Social History  Socioeconomic History  . Marital status: Divorced    Spouse name: Not on file  . Number of children: 2  . Years of education: 56  . Highest education level: Not on file  Occupational History  . Occupation: retired  Tobacco Use  . Smoking status: Never Smoker  . Smokeless tobacco: Never Used  . Tobacco comment: Lives alone-divorced  Vaping Use  . Vaping Use: Never used  Substance and Sexual Activity  . Alcohol use: Yes    Alcohol/week: 0.0 standard drinks    Comment: occ  . Drug use: No  . Sexual activity: Not on file  Other Topics Concern  . Not on file  Social History Narrative  . Not on file   Social Determinants of Health   Financial Resource Strain: Not on file  Food Insecurity: Not on file  Transportation Needs: Not on file  Physical Activity: Not on file  Stress: Not on file  Social Connections: Not on file     Family History:  The patient's family history includes Asthma in her father; Diabetes in her sister; Parkinson's disease in her mother. Her sister had long QT syndrome, but no documented ventricular arrhythmia  ROS:   Please see  the history of present illness.    All other systems are reviewed and are negative.   PHYSICAL EXAM:   VS:  BP 128/66   Pulse 77   Ht 5\' 2"  (1.575 m)   Wt 209 lb 6.4 oz (95 kg)   SpO2 97%   BMI 38.30 kg/m      General: Alert, oriented x3, no distress, appears well.  Healthy ICD site Head: no evidence of trauma, PERRL, EOMI, no exophtalmos or lid lag, no myxedema, no xanthelasma; normal ears, nose and oropharynx Neck: normal jugular venous pulsations and no hepatojugular reflux; brisk carotid pulses without delay and no carotid bruits Chest: clear to auscultation, no signs of consolidation by percussion or palpation, normal fremitus, symmetrical and full respiratory excursions Cardiovascular: normal position and quality of the apical impulse, regular rhythm, normal first and second heart sounds, no murmurs, rubs or gallops Abdomen: no tenderness or distention, no masses by palpation, no abnormal pulsatility or arterial bruits, normal bowel sounds, no hepatosplenomegaly Extremities: no clubbing, cyanosis; 2+ symmetrical pedal edema; 2+ radial, ulnar and brachial pulses bilaterally; 2+ right femoral, posterior tibial and dorsalis pedis pulses; 2+ left femoral, posterior tibial and dorsalis pedis pulses; no subclavian or femoral bruits Neurological: grossly nonfocal Psych: Normal mood and affect    Wt Readings from Last 3 Encounters:  08/17/20 209 lb 6.4 oz (95 kg)  03/23/20 207 lb 9.6 oz (94.2 kg)  09/23/19 203 lb 6.4 oz (92.3 kg)      Studies/Labs Reviewed:   EKG:  EKG is ordered today and it shows sinus rhythm with first-degree AV block (254 ms) LVH with borderline QRS broadening at 102 ms, QTC 461 ms Recent Labs: 08/17/2020: BUN 39; Creatinine, Ser 2.63; Potassium 5.0; Sodium 141   03/16/2020 Dr. [name] Creat 2.91, K 4.8, Hgb 11.9 Chol 172, HDL 46, LDL 91, TG 106 Hgb A1c 6.2%.  Lipid Panel    Component Value Date/Time   CHOL 184 09/13/2012 1357   TRIG 142 09/13/2012 1357    HDL 45 09/13/2012 1357   CHOLHDL 4.1 09/13/2012 1357   VLDL 28 09/13/2012 1357   LDLCALC 111 (H) 09/13/2012 1357   LDLDIRECT 141.3 10/14/2008 1004   ASSESSMENT:    1. Chronic combined systolic and diastolic CHF (congestive heart  failure) ([facility])   2. Non-ischemic cardiomyopathy ([facility])   3. Long QT interval   4. ICD (implantable cardioverter-defibrillator) in place   5. Factor V Leiden ([place])   6. Long term current use of anticoagulant   7. Essential hypertension   8. OSA (obstructive sleep apnea)   9. CKD (chronic kidney disease) stage 4, GFR 15-29 ml/min (HCC)   10. Severe obesity (BMI 35.0-35.9 with comorbidity) ([place])   11. Diabetes mellitus type 2 in obese [facility])    PLAN:  In order of problems listed above:  1. CHF: With the exception of mild pedal edema, she appears euvolemic and has NYHA functional class I.  Continue carvedilol and hydralazine/nitrates, avoiding RAAS inhibitors due to CKD stage IV.  Dr. Moshe Cipro has been adjusting her diuretic dose and she seems to be holding volume status very well. 2. Nonischemic cardiomyopathy: Normal coronary arteries by angiography just a couple of years ago.  History of possible Adriamycin chemotherapy for breast cancer in 1994, possible familial cardiomyopathy. 3. Long QT: Her sister had long QT syndrome.  Teresa's QT interval has always been borderline prolonged usually in the 460-480 ms range.  Generally wise to avoid QT prolonging medications. 4. ICD: Normal device function.  Continue remote downloads every 3 months and monthly OptiVol downloads.  The latter will become more more important as her renal function deteriorates. 5. Recurrent DVT/PE: Planned lifelong anticoagulation with factor V Leiden. 6. Anticoagulation: No bleeding problems. 7. HTN: Excellent control 8. OSA: Reports compliance and follows up with Dr. Elsworth Soho. 9. CKD 4: CKD has been stable in the 2.5-3.0 range over the last couple of years, most recently 2.6.  GFR is 15-20.   By Dr. Moshe Cipro.  They have began discussions regarding future need for hemodialysis.  She might be a good candidate for peritoneal dialysis.  She has never had abdominal surgeries. 10. Obesity: Some mild weight gain may be fluid related. 11. DM: Hemoglobin A1c 6.2%, well controlled, but up slightly.   Medication Adjustments/Labs and Tests Ordered: Current medicines are reviewed at length with the patient today.  Concerns regarding medicines are outlined above.  Medication changes, Labs and Tests ordered today are listed in the Patient Instructions below. Patient Instructions  Medication Instructions:  No changes *If you need a refill on your cardiac medications before your next appointment, please call your pharmacy*   Lab Work: Your provider would like for you to have the following labs today: BMET  If you have labs (blood work) drawn today and your tests are completely normal, you will receive your results only by: [name] MyChart Message (if you have MyChart) OR . A paper copy in the mail If you have any lab test that is abnormal or we need to change your treatment, we will call you to review the results.   Testing/Procedures: None ordered   Follow-Up: At [facility], you and your health needs are our priority.  As part of our continuing mission to provide you with exceptional heart care, we have created designated Provider Care Teams.  These Care Teams include your primary Cardiologist (physician) and Advanced Practice Providers (APPs -  Physician Assistants and Nurse Practitioners) who all work together to provide you with the care you need, when you need it.  We recommend signing up for the patient portal called "MyChart".  Sign up information is provided on this After Visit Summary.  MyChart is used to connect with patients for Virtual Visits (Telemedicine).  Patients are able to view lab/test results,  encounter notes, upcoming appointments, etc.  Non-urgent messages can be  sent to your provider as well.   To learn more about what you can do with MyChart, go to [email or web address].    Your next appointment:   12 month(s)  The format for your next appointment:   In Person  Provider:   Sanda Klein, MD       Signed, Sanda Klein, MD  08/20/2020 11:03 AM    [address], [place], [place]  [address] Phone: [phone]; Fax: [phone]

## 2020-08-17 NOTE — Patient Instructions (Addendum)
Medication Instructions:  °No changes °*If you need a refill on your cardiac medications before your next appointment, please call your pharmacy* ° ° °Lab Work: °Your provider would like for you to have the following labs today: BMET ° °If you have labs (blood work) drawn today and your tests are completely normal, you will receive your results only by: °MyChart Message (if you have MyChart) OR °A paper copy in the mail °If you have any lab test that is abnormal or we need to change your treatment, we will call you to review the results. ° ° °Testing/Procedures: °None ordered ° ° °Follow-Up: °At [facility], you and your health needs are our priority.  As part of our continuing mission to provide you with exceptional heart care, we have created designated Provider Care Teams.  These Care Teams include your primary Cardiologist (physician) and Advanced Practice Providers (APPs -  Physician Assistants and Nurse Practitioners) who all work together to provide you with the care you need, when you need it. ° °We recommend signing up for the patient portal called "MyChart".  Sign up information is provided on this After Visit Summary.  MyChart is used to connect with patients for Virtual Visits (Telemedicine).  Patients are able to view lab/test results, encounter notes, upcoming appointments, etc.  Non-urgent messages can be sent to your provider as well.   °To learn more about what you can do with MyChart, go to [email or web address].   ° °Your next appointment:   °12 month(s) ° °The format for your next appointment:   °In Person ° °Provider:   °Mihai Croitoru, MD   ° ° ° °

## 2020-08-18 ENCOUNTER — Encounter: Payer: Self-pay | Admitting: *Deleted

## 2020-08-20 ENCOUNTER — Encounter: Payer: Self-pay | Admitting: Cardiovascular Disease

## 2020-08-24 ENCOUNTER — Ambulatory Visit (INDEPENDENT_AMBULATORY_CARE_PROVIDER_SITE_OTHER): Payer: Medicare Other

## 2020-08-24 DIAGNOSIS — I5042 Chronic combined systolic (congestive) and diastolic (congestive) heart failure: Secondary | ICD-10-CM

## 2020-08-24 DIAGNOSIS — Z95810 Presence of automatic (implantable) cardiac defibrillator: Secondary | ICD-10-CM | POA: Diagnosis not present

## 2020-08-24 NOTE — Progress Notes (Signed)
EPIC Encounter for ICM Monitoring  Patient Name: Patricia Morgan is a 70 y.o. female Date: 08/24/2020 Primary Care Physican: Antony Contras, MD Primary Cardiologist:Croitoru Electrophysiologist:Croitoru Nephrologist:[place] 3/9/2022Weight:206lbs   Spoke with patient and reports she thought she had fluid this past weekend but feels it is has resolved.      OptivolThoracic impedancesuggesting possible fluid accumulation starting 08/17/2020 and returned close baseline normal.  Prescribed: Furosemide40 mg - Per patient 08/24/2020, Dr Moshe Cipro has instructed her to take 1 tablet daily and she can double of she has fluid symptoms.    Labs: 08/17/2020 Creatinine 2.63, BUN 39, Potassium 5.0, Sodium 141, GFR 19 08/12/2019 Creatinine2.56, BUN43, Potassium4.0, Sodium141, NTI14-43 A complete set of results can be found in Results Review.  Recommendations:No changes and encouraged to call if experiencing any fluid symptoms.  Follow-up plan: ICM clinic phone appointment on5/24/2022. 91 day device clinic remote transmission5/23/2022.  EP/Cardiology Office Visits:  Recall4/8/2023with Dr.Croitoru.   Copy of ICM check sent to Dr.Croitoru.  3 month ICM trend: 08/24/2020.    1 Year ICM trend:       Rosalene Billings, RN 08/24/2020 11:34 AM

## 2020-09-02 DIAGNOSIS — M810 Age-related osteoporosis without current pathological fracture: Secondary | ICD-10-CM | POA: Diagnosis not present

## 2020-09-02 DIAGNOSIS — R7989 Other specified abnormal findings of blood chemistry: Secondary | ICD-10-CM | POA: Diagnosis not present

## 2020-09-09 DIAGNOSIS — Z794 Long term (current) use of insulin: Secondary | ICD-10-CM | POA: Diagnosis not present

## 2020-09-09 DIAGNOSIS — E113212 Type 2 diabetes mellitus with mild nonproliferative diabetic retinopathy with macular edema, left eye: Secondary | ICD-10-CM | POA: Diagnosis not present

## 2020-09-09 DIAGNOSIS — H43823 Vitreomacular adhesion, bilateral: Secondary | ICD-10-CM | POA: Diagnosis not present

## 2020-09-09 DIAGNOSIS — H35073 Retinal telangiectasis, bilateral: Secondary | ICD-10-CM | POA: Diagnosis not present

## 2020-09-15 DIAGNOSIS — M109 Gout, unspecified: Secondary | ICD-10-CM | POA: Diagnosis not present

## 2020-09-15 DIAGNOSIS — E1122 Type 2 diabetes mellitus with diabetic chronic kidney disease: Secondary | ICD-10-CM | POA: Diagnosis not present

## 2020-09-15 DIAGNOSIS — N183 Chronic kidney disease, stage 3 unspecified: Secondary | ICD-10-CM | POA: Diagnosis not present

## 2020-09-15 DIAGNOSIS — I129 Hypertensive chronic kidney disease with stage 1 through stage 4 chronic kidney disease, or unspecified chronic kidney disease: Secondary | ICD-10-CM | POA: Diagnosis not present

## 2020-09-18 ENCOUNTER — Other Ambulatory Visit: Payer: Self-pay | Admitting: *Deleted

## 2020-09-18 DIAGNOSIS — I5042 Chronic combined systolic (congestive) and diastolic (congestive) heart failure: Secondary | ICD-10-CM

## 2020-09-21 ENCOUNTER — Encounter: Payer: Medicare Other | Admitting: Cardiovascular Disease

## 2020-10-05 ENCOUNTER — Ambulatory Visit (INDEPENDENT_AMBULATORY_CARE_PROVIDER_SITE_OTHER): Payer: Medicare Other

## 2020-10-05 DIAGNOSIS — I428 Other cardiomyopathies: Secondary | ICD-10-CM

## 2020-10-05 DIAGNOSIS — G4733 Obstructive sleep apnea (adult) (pediatric): Secondary | ICD-10-CM | POA: Diagnosis not present

## 2020-10-06 ENCOUNTER — Ambulatory Visit (INDEPENDENT_AMBULATORY_CARE_PROVIDER_SITE_OTHER): Payer: Medicare Other

## 2020-10-06 DIAGNOSIS — I5042 Chronic combined systolic (congestive) and diastolic (congestive) heart failure: Secondary | ICD-10-CM

## 2020-10-06 DIAGNOSIS — Z95810 Presence of automatic (implantable) cardiac defibrillator: Secondary | ICD-10-CM

## 2020-10-07 NOTE — Progress Notes (Signed)
Steady as she goes!

## 2020-10-07 NOTE — Progress Notes (Signed)
EPIC Encounter for ICM Monitoring  Patient Name: Rosezetta Balderston is a 70 y.o. female Date: 10/07/2020 Primary Care Physican: Antony Contras, MD Primary Cardiologist:Croitoru Electrophysiologist:Croitoru Nephrologist:[place] 4/20/2022Weight:214lbs   Spoke with patient and reports feeling well at this time. Heart failure questions reviewed. Pt asymptomatic for fluid.  OptivolThoracic impedancesuggesting normal fluid levels.  Prescribed: Furosemide40 mg - Per patient 08/24/2020, Dr Moshe Cipro has instructed her to take 1 tablet daily and she can double of she has fluid symptoms.    Labs: 08/17/2020 Creatinine 2.63, BUN 39, Potassium 5.0, Sodium 141, GFR 19 A complete set of results can be found in Results Review.  Recommendations: No changes and encouraged to call if experiencing any fluid symptoms.  Follow-up plan: ICM clinic phone appointment on6/27/2022. 91 day device clinic remote transmission8/22/2022.  EP/Cardiology Office Visits:  Recall4/8/2023with Dr.Croitoru.   Copy of ICM check sent to Dr.Croitoru.  3 month ICM trend: 10/05/2020.    1 Year ICM trend:       Rosalene Billings, RN 10/07/2020 9:38 AM

## 2020-10-10 LAB — CUP PACEART REMOTE DEVICE CHECK
Battery Remaining Longevity: 120 mo
Battery Voltage: 3.01 V
Brady Statistic RV Percent Paced: 0.01 %
Date Time Interrogation Session: 20220523001806
HighPow Impedance: 70 Ohm
Implantable Lead Implant Date: 20191030
Implantable Lead Location: 753860
Implantable Pulse Generator Implant Date: 20191030
Lead Channel Impedance Value: 342 Ohm
Lead Channel Impedance Value: 399 Ohm
Lead Channel Pacing Threshold Amplitude: 0.75 V
Lead Channel Pacing Threshold Pulse Width: 0.4 ms
Lead Channel Sensing Intrinsic Amplitude: 31.625 mV
Lead Channel Sensing Intrinsic Amplitude: 31.625 mV
Lead Channel Setting Pacing Amplitude: 2 V
Lead Channel Setting Pacing Pulse Width: 0.4 ms
Lead Channel Setting Sensing Sensitivity: 0.3 mV

## 2020-10-19 ENCOUNTER — Other Ambulatory Visit: Payer: Self-pay

## 2020-10-19 ENCOUNTER — Ambulatory Visit (HOSPITAL_COMMUNITY): Payer: Medicare Other | Attending: Internal Medicine

## 2020-10-19 ENCOUNTER — Other Ambulatory Visit: Payer: Self-pay | Admitting: Cardiovascular Disease

## 2020-10-19 DIAGNOSIS — I5042 Chronic combined systolic (congestive) and diastolic (congestive) heart failure: Secondary | ICD-10-CM | POA: Diagnosis not present

## 2020-10-19 LAB — ECHOCARDIOGRAM COMPLETE
Area-P 1/2: 5.54 cm2
S' Lateral: 4.9 cm

## 2020-10-19 MED ORDER — PERFLUTREN LIPID MICROSPHERE
1.0000 mL | INTRAVENOUS | Status: AC | PRN
  Administered 2020-10-19: 1 mL via INTRAVENOUS

## 2020-10-27 NOTE — Progress Notes (Signed)
Remote ICD transmission.   

## 2020-11-09 ENCOUNTER — Ambulatory Visit (INDEPENDENT_AMBULATORY_CARE_PROVIDER_SITE_OTHER): Payer: Medicare Other

## 2020-11-09 DIAGNOSIS — Z95810 Presence of automatic (implantable) cardiac defibrillator: Secondary | ICD-10-CM

## 2020-11-09 DIAGNOSIS — I5042 Chronic combined systolic (congestive) and diastolic (congestive) heart failure: Secondary | ICD-10-CM

## 2020-11-11 NOTE — Progress Notes (Signed)
EPIC Encounter for ICM Monitoring  Patient Name: Patricia Morgan is a 70 y.o. female Date: 11/11/2020 Primary Care Physican: Antony Contras, MD Primary Cardiologist: Croitoru Electrophysiologist: Croitoru Nephrologist: Moshe Cipro 11/11/2020 Weight: 214 lbs     Spoke with patient and heart failure questions reviewed.  Pt asymptomatic for fluid accumulation and feeing well.  She takes extra Furosemide as needed.   Optivol Thoracic impedance suggesting normal fluid levels.   Prescribed:  Furosemide 40 mg -  Per patient 08/24/2020, Dr Moshe Cipro has instructed her to take 1 tablet daily and she can double of she has fluid symptoms.     Labs: 08/17/2020 Creatinine 2.63, BUN 39, Potassium 5.0, Sodium 141, GFR 19 A complete set of results can be found in Results Review.   Recommendations:  No changes and encouraged to call if experiencing any fluid symptoms.   Follow-up plan: ICM clinic phone appointment on 12/14/2020.   91 day device clinic remote transmission 01/04/2021.     EP/Cardiology Office Visits:  Recall 08/21/2021 with Dr. Sallyanne Kuster.     Copy of ICM check sent to Dr. Sallyanne Kuster.    3 month ICM trend: 11/09/2020.    1 Year ICM trend:       Rosalene Billings, RN 11/11/2020 1:55 PM

## 2020-11-29 ENCOUNTER — Other Ambulatory Visit: Payer: Self-pay | Admitting: Cardiovascular Disease

## 2020-12-03 DIAGNOSIS — D649 Anemia, unspecified: Secondary | ICD-10-CM | POA: Diagnosis not present

## 2020-12-03 DIAGNOSIS — D6851 Activated protein C resistance: Secondary | ICD-10-CM | POA: Diagnosis not present

## 2020-12-03 DIAGNOSIS — E559 Vitamin D deficiency, unspecified: Secondary | ICD-10-CM | POA: Diagnosis not present

## 2020-12-03 DIAGNOSIS — I428 Other cardiomyopathies: Secondary | ICD-10-CM | POA: Diagnosis not present

## 2020-12-03 DIAGNOSIS — I129 Hypertensive chronic kidney disease with stage 1 through stage 4 chronic kidney disease, or unspecified chronic kidney disease: Secondary | ICD-10-CM | POA: Diagnosis not present

## 2020-12-03 DIAGNOSIS — E7800 Pure hypercholesterolemia, unspecified: Secondary | ICD-10-CM | POA: Diagnosis not present

## 2020-12-03 DIAGNOSIS — E1122 Type 2 diabetes mellitus with diabetic chronic kidney disease: Secondary | ICD-10-CM | POA: Diagnosis not present

## 2020-12-03 DIAGNOSIS — D72819 Decreased white blood cell count, unspecified: Secondary | ICD-10-CM | POA: Diagnosis not present

## 2020-12-03 DIAGNOSIS — M109 Gout, unspecified: Secondary | ICD-10-CM | POA: Diagnosis not present

## 2020-12-03 DIAGNOSIS — N184 Chronic kidney disease, stage 4 (severe): Secondary | ICD-10-CM | POA: Diagnosis not present

## 2020-12-14 ENCOUNTER — Ambulatory Visit (INDEPENDENT_AMBULATORY_CARE_PROVIDER_SITE_OTHER): Payer: Medicare Other

## 2020-12-14 DIAGNOSIS — Z95810 Presence of automatic (implantable) cardiac defibrillator: Secondary | ICD-10-CM | POA: Diagnosis not present

## 2020-12-14 DIAGNOSIS — I5042 Chronic combined systolic (congestive) and diastolic (congestive) heart failure: Secondary | ICD-10-CM | POA: Diagnosis not present

## 2020-12-16 ENCOUNTER — Telehealth: Payer: Self-pay

## 2020-12-16 NOTE — Progress Notes (Signed)
EPIC Encounter for ICM Monitoring  Patient Name: Patricia Morgan is a 70 y.o. female Date: 12/16/2020 Primary Care Physican: Antony Contras, MD Primary Cardiologist: Croitoru Electrophysiologist: Croitoru Nephrologist: Moshe Cipro 11/11/2020 Weight: 214 lbs     Attempted call to patient and unable to reach.  Left message to return call. Transmission reviewed.    Optivol Thoracic impedance suggesting normal fluid levels.   Prescribed:  Furosemide 40 mg -  Per patient 08/24/2020, Dr Moshe Cipro has instructed her to take 1 tablet daily and she can double of she has fluid symptoms.     Labs: 08/17/2020 Creatinine 2.63, BUN 39, Potassium 5.0, Sodium 141, GFR 19 A complete set of results can be found in Results Review.   Recommendations:  Left voice mail with ICM number and encouraged to call if experiencing any fluid symptoms.   Follow-up plan: ICM clinic phone appointment on 01/19/2021.   91 day device clinic remote transmission 01/04/2021.     EP/Cardiology Office Visits:  Recall 08/21/2021 with Dr. Sallyanne Kuster.     Copy of ICM check sent to Dr. Sallyanne Kuster.     3 month ICM trend: 12/14/2020.    1 Year ICM trend:       Rosalene Billings, RN 12/16/2020 4:20 PM

## 2020-12-16 NOTE — Telephone Encounter (Signed)
Remote ICM transmission received.  Attempted call to patient regarding ICM remote transmission and left message per DPR to return call.   

## 2020-12-21 DIAGNOSIS — M109 Gout, unspecified: Secondary | ICD-10-CM | POA: Diagnosis not present

## 2020-12-21 DIAGNOSIS — N183 Chronic kidney disease, stage 3 unspecified: Secondary | ICD-10-CM | POA: Diagnosis not present

## 2020-12-21 DIAGNOSIS — E1122 Type 2 diabetes mellitus with diabetic chronic kidney disease: Secondary | ICD-10-CM | POA: Diagnosis not present

## 2020-12-21 DIAGNOSIS — I129 Hypertensive chronic kidney disease with stage 1 through stage 4 chronic kidney disease, or unspecified chronic kidney disease: Secondary | ICD-10-CM | POA: Diagnosis not present

## 2020-12-21 DIAGNOSIS — N189 Chronic kidney disease, unspecified: Secondary | ICD-10-CM | POA: Diagnosis not present

## 2020-12-24 DIAGNOSIS — I272 Pulmonary hypertension, unspecified: Secondary | ICD-10-CM | POA: Diagnosis not present

## 2020-12-24 DIAGNOSIS — I129 Hypertensive chronic kidney disease with stage 1 through stage 4 chronic kidney disease, or unspecified chronic kidney disease: Secondary | ICD-10-CM | POA: Diagnosis not present

## 2020-12-24 DIAGNOSIS — M810 Age-related osteoporosis without current pathological fracture: Secondary | ICD-10-CM | POA: Diagnosis not present

## 2020-12-24 DIAGNOSIS — E1122 Type 2 diabetes mellitus with diabetic chronic kidney disease: Secondary | ICD-10-CM | POA: Diagnosis not present

## 2020-12-24 DIAGNOSIS — N184 Chronic kidney disease, stage 4 (severe): Secondary | ICD-10-CM | POA: Diagnosis not present

## 2020-12-24 DIAGNOSIS — I5020 Unspecified systolic (congestive) heart failure: Secondary | ICD-10-CM | POA: Diagnosis not present

## 2020-12-24 DIAGNOSIS — I10 Essential (primary) hypertension: Secondary | ICD-10-CM | POA: Diagnosis not present

## 2020-12-24 DIAGNOSIS — E7800 Pure hypercholesterolemia, unspecified: Secondary | ICD-10-CM | POA: Diagnosis not present

## 2021-01-04 ENCOUNTER — Ambulatory Visit (INDEPENDENT_AMBULATORY_CARE_PROVIDER_SITE_OTHER): Payer: Medicare Other

## 2021-01-04 DIAGNOSIS — I428 Other cardiomyopathies: Secondary | ICD-10-CM

## 2021-01-05 ENCOUNTER — Telehealth: Payer: Self-pay

## 2021-01-05 LAB — CUP PACEART REMOTE DEVICE CHECK
Battery Remaining Longevity: 118 mo
Battery Voltage: 3 V
Brady Statistic RV Percent Paced: 0.01 %
Date Time Interrogation Session: 20220822213526
HighPow Impedance: 79 Ohm
Implantable Lead Implant Date: 20191030
Implantable Lead Location: 753860
Implantable Pulse Generator Implant Date: 20191030
Lead Channel Impedance Value: 342 Ohm
Lead Channel Impedance Value: 418 Ohm
Lead Channel Pacing Threshold Amplitude: 0.75 V
Lead Channel Pacing Threshold Pulse Width: 0.4 ms
Lead Channel Sensing Intrinsic Amplitude: 24.5 mV
Lead Channel Sensing Intrinsic Amplitude: 24.5 mV
Lead Channel Setting Pacing Amplitude: 2 V
Lead Channel Setting Pacing Pulse Width: 0.4 ms
Lead Channel Setting Sensing Sensitivity: 0.3 mV

## 2021-01-05 NOTE — Telephone Encounter (Signed)
Returned patient call per voice mail request.  She asked how the last remote transmission fluid levels looked and advised report suggesting are normal. She reports doing well and has no complaints. Next ICM remote transmission 9/6.  Encouraged to call for any changes in condition.

## 2021-01-07 DIAGNOSIS — G4733 Obstructive sleep apnea (adult) (pediatric): Secondary | ICD-10-CM | POA: Diagnosis not present

## 2021-01-19 ENCOUNTER — Ambulatory Visit (INDEPENDENT_AMBULATORY_CARE_PROVIDER_SITE_OTHER): Payer: Medicare Other

## 2021-01-19 DIAGNOSIS — Z95810 Presence of automatic (implantable) cardiac defibrillator: Secondary | ICD-10-CM | POA: Diagnosis not present

## 2021-01-19 DIAGNOSIS — I5042 Chronic combined systolic (congestive) and diastolic (congestive) heart failure: Secondary | ICD-10-CM | POA: Diagnosis not present

## 2021-01-20 NOTE — Progress Notes (Signed)
Remote ICD transmission.   

## 2021-01-20 NOTE — Progress Notes (Signed)
EPIC Encounter for ICM Monitoring  Patient Name: Patricia Morgan is a 70 y.o. female Date: 01/20/2021 Primary Care Physican: Antony Contras, MD Primary Cardiologist: Croitoru Electrophysiologist: Croitoru Nephrologist: Moshe Cipro 01/20/2021 Weight: 214 lbs     Spoke with patient and heart failure questions reviewed.  Pt asymptomatic for fluid accumulation and feeling well.   Optivol Thoracic impedance suggesting normal fluid levels.   Prescribed:  Furosemide 40 mg -  Per patient 08/24/2020, Dr Moshe Cipro has instructed her to take 1 tablet daily and she can double of she has fluid symptoms.     Labs: 08/17/2020 Creatinine 2.63, BUN 39, Potassium 5.0, Sodium 141, GFR 19 A complete set of results can be found in Results Review.   Recommendations:  No changes and encouraged to call if experiencing any fluid symptoms.   Follow-up plan: ICM clinic phone appointment on 02/22/2021.   91 day device clinic remote transmission 04/05/2021.     EP/Cardiology Office Visits:  Recall 08/21/2021 with Dr. Sallyanne Kuster.     Copy of ICM check sent to Dr. Sallyanne Kuster.   3 month ICM trend: 01/19/2021.    1 Year ICM trend:       Rosalene Billings, RN 01/20/2021 3:56 PM

## 2021-02-22 ENCOUNTER — Ambulatory Visit (INDEPENDENT_AMBULATORY_CARE_PROVIDER_SITE_OTHER): Payer: Medicare Other

## 2021-02-22 DIAGNOSIS — Z95810 Presence of automatic (implantable) cardiac defibrillator: Secondary | ICD-10-CM

## 2021-02-22 DIAGNOSIS — I5042 Chronic combined systolic (congestive) and diastolic (congestive) heart failure: Secondary | ICD-10-CM

## 2021-02-23 ENCOUNTER — Other Ambulatory Visit: Payer: Self-pay | Admitting: Family Medicine

## 2021-02-23 DIAGNOSIS — Z1231 Encounter for screening mammogram for malignant neoplasm of breast: Secondary | ICD-10-CM

## 2021-02-24 NOTE — Progress Notes (Signed)
EPIC Encounter for ICM Monitoring  Patient Name: Patricia Morgan is a 70 y.o. female Date: 02/24/2021 Primary Care Physican: Antony Contras, MD Primary Cardiologist: Croitoru Electrophysiologist: Croitoru Nephrologist: Moshe Cipro 02/24/2021 Weight: 207 lbs     Spoke with patient and heart failure questions reviewed.  Pt asymptomatic for fluid accumulation and feeling well.    Optivol Thoracic impedance suggesting normal fluid levels.   Prescribed:  Furosemide 40 mg -  Per patient 08/24/2020, Dr Moshe Cipro has instructed her to take 1 tablet daily and she can double of she has fluid symptoms.     Labs: 08/17/2020 Creatinine 2.63, BUN 39, Potassium 5.0, Sodium 141, GFR 19 A complete set of results can be found in Results Review.   Recommendations:  No changes and encouraged to call if experiencing any fluid symptoms.   Follow-up plan: ICM clinic phone appointment on 03/29/2021.   91 day device clinic remote transmission 04/05/2021.     EP/Cardiology Office Visits:  Recall 08/21/2021 with Dr. Sallyanne Kuster.     Copy of ICM check sent to Dr. Sallyanne Kuster.    3 month ICM trend: 02/22/2021.    1 Year ICM trend:       Rosalene Billings, RN 02/24/2021 4:11 PM

## 2021-02-26 NOTE — Progress Notes (Signed)
Received: 2 days ago Croitoru, Dani Gobble, MD  Roland Lipke Panda, RN [place]. Thanks

## 2021-03-23 ENCOUNTER — Ambulatory Visit: Payer: Medicare Other

## 2021-03-29 ENCOUNTER — Ambulatory Visit (INDEPENDENT_AMBULATORY_CARE_PROVIDER_SITE_OTHER): Payer: Medicare Other

## 2021-03-29 DIAGNOSIS — Z95810 Presence of automatic (implantable) cardiac defibrillator: Secondary | ICD-10-CM

## 2021-03-29 DIAGNOSIS — I5042 Chronic combined systolic (congestive) and diastolic (congestive) heart failure: Secondary | ICD-10-CM | POA: Diagnosis not present

## 2021-03-30 DIAGNOSIS — N183 Chronic kidney disease, stage 3 unspecified: Secondary | ICD-10-CM | POA: Diagnosis not present

## 2021-03-30 DIAGNOSIS — I129 Hypertensive chronic kidney disease with stage 1 through stage 4 chronic kidney disease, or unspecified chronic kidney disease: Secondary | ICD-10-CM | POA: Diagnosis not present

## 2021-03-30 DIAGNOSIS — M109 Gout, unspecified: Secondary | ICD-10-CM | POA: Diagnosis not present

## 2021-03-30 DIAGNOSIS — N189 Chronic kidney disease, unspecified: Secondary | ICD-10-CM | POA: Diagnosis not present

## 2021-03-30 DIAGNOSIS — E1122 Type 2 diabetes mellitus with diabetic chronic kidney disease: Secondary | ICD-10-CM | POA: Diagnosis not present

## 2021-03-31 NOTE — Progress Notes (Signed)
EPIC Encounter for ICM Monitoring  Patient Name: Patricia Morgan is a 70 y.o. female Date: 03/31/2021 Primary Care Physican: Antony Contras, MD Primary Cardiologist: Croitoru Electrophysiologist: Croitoru Nephrologist: Moshe Cipro 02/24/2021 Weight: 207 lbs     Spoke with patient and heart failure questions reviewed.  Pt asymptomatic for fluid accumulation and feeling well.    Optivol Thoracic impedance suggesting possible fluid accumulation starting 11/7 and returning to normal 11/14.   Prescribed:  Furosemide 40 mg -  Per patient 08/24/2020, Dr Moshe Cipro has instructed her to take 1 tablet daily and she can double of she has fluid symptoms.     Labs: 08/17/2020 Creatinine 2.63, BUN 39, Potassium 5.0, Sodium 141, GFR 19 A complete set of results can be found in Results Review.   Recommendations:  No changes and encouraged to call if experiencing any fluid symptoms.   Follow-up plan: ICM clinic phone appointment on 05/03/2021.   91 day device clinic remote transmission 04/05/2021.     EP/Cardiology Office Visits:  Recall 08/21/2021 with Dr. Sallyanne Kuster.     Copy of ICM check sent to Dr. Sallyanne Kuster.    3 month ICM trend: 03/30/2021.    12-14 Month ICM trend:       Rosalene Billings, RN 03/31/2021 4:49 PM

## 2021-04-05 ENCOUNTER — Ambulatory Visit (INDEPENDENT_AMBULATORY_CARE_PROVIDER_SITE_OTHER): Payer: Medicare Other

## 2021-04-05 DIAGNOSIS — I428 Other cardiomyopathies: Secondary | ICD-10-CM

## 2021-04-06 LAB — CUP PACEART REMOTE DEVICE CHECK
Battery Remaining Longevity: 115 mo
Battery Voltage: 3 V
Brady Statistic RV Percent Paced: 0.01 %
Date Time Interrogation Session: 20221121023325
HighPow Impedance: 79 Ohm
Implantable Lead Implant Date: 20191030
Implantable Lead Location: 753860
Implantable Pulse Generator Implant Date: 20191030
Lead Channel Impedance Value: 342 Ohm
Lead Channel Impedance Value: 418 Ohm
Lead Channel Pacing Threshold Amplitude: 0.75 V
Lead Channel Pacing Threshold Pulse Width: 0.4 ms
Lead Channel Sensing Intrinsic Amplitude: 21.125 mV
Lead Channel Sensing Intrinsic Amplitude: 21.125 mV
Lead Channel Setting Pacing Amplitude: 2 V
Lead Channel Setting Pacing Pulse Width: 0.4 ms
Lead Channel Setting Sensing Sensitivity: 0.3 mV

## 2021-04-14 NOTE — Progress Notes (Signed)
Remote ICD transmission.   

## 2021-04-15 DIAGNOSIS — I428 Other cardiomyopathies: Secondary | ICD-10-CM | POA: Diagnosis not present

## 2021-04-15 DIAGNOSIS — E559 Vitamin D deficiency, unspecified: Secondary | ICD-10-CM | POA: Diagnosis not present

## 2021-04-15 DIAGNOSIS — M109 Gout, unspecified: Secondary | ICD-10-CM | POA: Diagnosis not present

## 2021-04-15 DIAGNOSIS — E1122 Type 2 diabetes mellitus with diabetic chronic kidney disease: Secondary | ICD-10-CM | POA: Diagnosis not present

## 2021-04-15 DIAGNOSIS — H9201 Otalgia, right ear: Secondary | ICD-10-CM | POA: Diagnosis not present

## 2021-04-15 DIAGNOSIS — N184 Chronic kidney disease, stage 4 (severe): Secondary | ICD-10-CM | POA: Diagnosis not present

## 2021-04-15 DIAGNOSIS — Z0000 Encounter for general adult medical examination without abnormal findings: Secondary | ICD-10-CM | POA: Diagnosis not present

## 2021-04-15 DIAGNOSIS — D6869 Other thrombophilia: Secondary | ICD-10-CM | POA: Diagnosis not present

## 2021-04-15 DIAGNOSIS — E7800 Pure hypercholesterolemia, unspecified: Secondary | ICD-10-CM | POA: Diagnosis not present

## 2021-04-15 DIAGNOSIS — Z1389 Encounter for screening for other disorder: Secondary | ICD-10-CM | POA: Diagnosis not present

## 2021-04-15 DIAGNOSIS — I129 Hypertensive chronic kidney disease with stage 1 through stage 4 chronic kidney disease, or unspecified chronic kidney disease: Secondary | ICD-10-CM | POA: Diagnosis not present

## 2021-04-15 DIAGNOSIS — D6851 Activated protein C resistance: Secondary | ICD-10-CM | POA: Diagnosis not present

## 2021-04-15 DIAGNOSIS — D649 Anemia, unspecified: Secondary | ICD-10-CM | POA: Diagnosis not present

## 2021-04-16 DIAGNOSIS — G4733 Obstructive sleep apnea (adult) (pediatric): Secondary | ICD-10-CM | POA: Diagnosis not present

## 2021-04-20 ENCOUNTER — Ambulatory Visit: Payer: Medicare Other

## 2021-05-03 ENCOUNTER — Ambulatory Visit (INDEPENDENT_AMBULATORY_CARE_PROVIDER_SITE_OTHER): Payer: Medicare Other

## 2021-05-03 DIAGNOSIS — Z95810 Presence of automatic (implantable) cardiac defibrillator: Secondary | ICD-10-CM | POA: Diagnosis not present

## 2021-05-03 DIAGNOSIS — I5042 Chronic combined systolic (congestive) and diastolic (congestive) heart failure: Secondary | ICD-10-CM | POA: Diagnosis not present

## 2021-05-05 ENCOUNTER — Telehealth: Payer: Self-pay

## 2021-05-05 NOTE — Progress Notes (Signed)
EPIC Encounter for ICM Monitoring  Patient Name: Patricia Morgan is a 70 y.o. female Date: 05/05/2021 Primary Care Physican: Antony Contras, MD Primary Cardiologist: Croitoru Electrophysiologist: Croitoru Nephrologist: Moshe Cipro 02/24/2021 Weight: 207 lbs     Attempted call to patient and unable to reach.  Left message to return call. Transmission reviewed.    Optivol Thoracic impedance suggesting possible fluid accumulation starting 11/29 and returning to normal 12/19.   Prescribed:  Furosemide 40 mg -  Per patient 08/24/2020, Dr Moshe Cipro has instructed her to take 1 tablet daily and she can double of she has fluid symptoms.     Labs: 08/17/2020 Creatinine 2.63, BUN 39, Potassium 5.0, Sodium 141, GFR 19 A complete set of results can be found in Results Review.   Recommendations:  Unable to reach.     Follow-up plan: ICM clinic phone appointment on 06/07/2021.   91 day device clinic remote transmission 07/05/2021.     EP/Cardiology Office Visits:  Recall 08/21/2021 with Dr. Sallyanne Kuster.     Copy of ICM check sent to Dr. Sallyanne Kuster.   3 month ICM trend: 05/03/2021.    12-14 Month ICM trend:       Rosalene Billings, RN 05/05/2021 12:51 PM

## 2021-05-05 NOTE — Telephone Encounter (Signed)
Remote ICM transmission received.  Attempted call to patient regarding ICM remote transmission and left message to return call   

## 2021-06-07 ENCOUNTER — Ambulatory Visit (INDEPENDENT_AMBULATORY_CARE_PROVIDER_SITE_OTHER): Payer: Medicare Other

## 2021-06-07 DIAGNOSIS — I5042 Chronic combined systolic (congestive) and diastolic (congestive) heart failure: Secondary | ICD-10-CM | POA: Diagnosis not present

## 2021-06-07 DIAGNOSIS — Z95810 Presence of automatic (implantable) cardiac defibrillator: Secondary | ICD-10-CM

## 2021-06-08 ENCOUNTER — Other Ambulatory Visit: Payer: Self-pay

## 2021-06-08 ENCOUNTER — Ambulatory Visit
Admission: EM | Admit: 2021-06-08 | Discharge: 2021-06-08 | Disposition: A | Discharge status: Home / Self Care | Payer: Medicare Other | Attending: Emergency Medicine | Admitting: Emergency Medicine

## 2021-06-08 DIAGNOSIS — H60391 Other infective otitis externa, right ear: Secondary | ICD-10-CM

## 2021-06-08 DIAGNOSIS — H6121 Impacted cerumen, right ear: Secondary | ICD-10-CM | POA: Diagnosis not present

## 2021-06-08 DIAGNOSIS — H6061 Unspecified chronic otitis externa, right ear: Secondary | ICD-10-CM | POA: Diagnosis not present

## 2021-06-08 DIAGNOSIS — H608X1 Other otitis externa, right ear: Secondary | ICD-10-CM

## 2021-06-08 DIAGNOSIS — H9201 Otalgia, right ear: Secondary | ICD-10-CM | POA: Diagnosis not present

## 2021-06-08 MED ORDER — FLUOCINOLONE ACETONIDE 0.01 % OT OIL: 5.0000 [drp] | TOPICAL_OIL | Freq: Every day | OTIC | 0 refills | Status: AC

## 2021-06-08 MED ORDER — FLUOCINOLONE ACETONIDE 0.01 % OT OIL: 5.0000 [drp] | TOPICAL_OIL | Freq: Every day | OTIC | 0 refills | Status: DC

## 2021-06-08 MED ORDER — CIPROFLOXACIN-DEXAMETHASONE 0.3-0.1 % OT SUSP: 4.0000 [drp] | Freq: Two times a day (BID) | OTIC | 0 refills | Status: AC

## 2021-06-08 NOTE — ED Provider Notes (Signed)
[facility]    CSN: [id number] Arrival date & time: 06/08/21  1141    HISTORY   Chief Complaint  Patient presents with   Otalgia   HPI Patricia Morgan is a 71 y.o. female. Patient complains of a 2 to 3-week history of pain in her right ear.  Patient states she has dealt with this issue "for years".  Patient states she is seeing her primary care doctor about this as well as an ear nose and throat.  Patient states the most she is ever been told is to use sweet oil on her ears, patient states this does not help at all.  Patient states that the pain is triggered by multiple things, sometimes bending over, sometimes cold air, states sometimes it just hurts for no reason at all.  Patient states the pain is stabbing, "excruciating", sometimes "takes my breath away".  Patient denies hearing loss, fever, aches, chills, dizziness, headache, pain on external ear.  Patient states the pain is "deep".  The history is provided by the patient.  Past Medical History:  Diagnosis Date   ANEMIA-NOS    BREAST CANCER, HX OF 1994 & 1995   1994>>recurrence in 1995, R breast, s/p lumpectomy>>mastectomy   Cancer ([place])    Cardiomyopathy, nonischemic ([place])    CHF (congestive heart failure) (HCC)    chronic systolic and diastolic CHF   CHRONIC KIDNEY DISEASE STAGE III (MODERATE)    Clotting disorder ([place])    on eliquis for Factor 5 disorder   Colon polyps    DIABETES MELLITUS, TYPE II    Diverticulosis    HYPERLIPIDEMIA    HYPERTENSION    ILD (interstitial lung disease) ([place])    10/2018 CT findings suggestive of sarcoidosis; followed by Dr. Elsworth Soho   OBSTRUCTIVE SLEEP APNEA    CPAP   Personal history of chemotherapy    Proteinuria    PULMONARY EMBOLISM 09/24/2008   anticoag thru 03/2010   Rotator cuff tear    Left   Shoulder impingement, left    Sleep apnea    Wears CPAP nightly   Patient Active Problem List   Diagnosis Date Noted   Complete rotator cuff tear of left shoulder 01/24/2019    Sarcoidosis 11/12/2018   ILD (interstitial lung disease) ([place]) 03/30/2018   At risk for sudden cardiac death 03-21-18   ICD (implantable cardioverter-defibrillator) in place 03/21/2018   Long QT interval 03/08/2018   Long term current use of anticoagulant 03/08/2018   Diabetes mellitus type 2 in obese ([place]) 03/08/2018   Factor V Leiden ([place]) 01/12/2016   Chronic combined systolic and diastolic CHF (congestive heart failure) ([place]) 11/03/2015   Hypertensive heart disease 11/03/2015   Non-ischemic cardiomyopathy (HCC)    Abnormal nuclear stress test 11/01/2015   Chronic renal failure 11/01/2015   Unstable angina ([place]) 11/01/2015   Encounter for therapeutic drug monitoring 09/04/2015   Popliteal DVT (deep venous thrombosis) ([place]) 08/31/2015   Left ventricular systolic dysfunction, undermined duration 08/31/2015   SOB (shortness of breath) 08/30/2015   Severe obesity (BMI 35.0-35.9 with comorbidity) ([place]) 08/30/2015   Leg pain, left 08/30/2015   DVT (deep venous thrombosis) ([place]) 09/23/2014   Pulmonary embolism ([place])    Otitis, externa 09/17/2013   Right knee pain 09/17/2013   Low back pain 01/08/2013   Unspecified constipation 07/16/2012   Preventative health care 05/30/2012   CKD (chronic kidney disease), stage IV ([place]) 06/26/2009   PROTEINURIA 06/26/2009   OSA (obstructive sleep apnea) 10/14/2008   Diabetes mellitus (  [place]) 09/29/2008   Benign essential HTN 09/26/2008   ANEMIA-NOS 09/26/2008   BREAST CANCER, HX OF 09/26/2008   Past Surgical History:  Procedure Laterality Date   BREAST SURGERY     CARDIAC CATHETERIZATION N/A 11/02/2015   Procedure: Right/Left Heart Cath and Coronary Angiography;  Surgeon: Burnell Blanks, MD;  Location: [facility];  Service: Cardiovascular;  Laterality: N/A;   DILATION AND CURETTAGE OF UTERUS     ICD IMPLANT N/A 03/14/2018   Procedure: ICD IMPLANT;  Surgeon: Sanda Klein, MD;  Location: [facility];  Service:  Cardiovascular;  Laterality: N/A;   insertion port a cath     MASTECTOMY Right 1995   Right   PORT-A-CATH REMOVAL     SHOULDER ARTHROSCOPY WITH ROTATOR CUFF REPAIR AND SUBACROMIAL DECOMPRESSION Left 01/24/2019   Procedure: SHOULDER ARTHROSCOPY WITH ROTATOR CUFF REPAIR AND SUBACROMIAL DECOMPRESSION;  Surgeon: Nicholes Stairs, MD;  Location: [facility];  Service: Orthopedics;  Laterality: Left;  2.5 hrs   OB History   No obstetric history on file.    Home Medications    Prior to Admission medications   Medication Sig Start Date End Date Taking? Authorizing Provider  acetaminophen (TYLENOL) 500 MG tablet Take 500 mg by mouth every 6 (six) hours as needed for mild pain.    [provider]  allopurinol (ZYLOPRIM) 100 MG tablet Take 100 mg by mouth 2 (two) times daily.     [provider]  carvedilol (COREG) 25 MG tablet Take 0.5 tablets (12.5 mg total) by mouth 2 (two) times daily with a meal. 07/16/20   Croitoru, Mihai, MD  Cholecalciferol (VITAMIN D-3) 5000 units TABS Take 5,000 Units by mouth daily.     [provider]  colchicine 0.6 MG tablet Take 0.6 mg by mouth as needed.  07/13/14   [provider]  diphenhydrAMINE (BENADRYL) 25 MG tablet Take 25 mg by mouth daily.    [provider]  ELIQUIS 5 MG TABS tablet TAKE 1 TABLET TWICE DAILY 05/09/19   Volanda Napoleon, MD  ferrous sulfate 325 (65 FE) MG tablet Take 325 mg by mouth daily with breakfast.    [provider]  furosemide (LASIX) 40 MG tablet Take 2 tablets (80 mg total) by mouth daily. 11/06/2019 Per pt the nephrologist increased Furosemide to 80 mg every AM and 40 mg every afternoon.  She may also take additional 40 mg in the afternoon for lower extremity swelling. 12/17/19   Croitoru, Mihai, MD  hydrALAZINE (APRESOLINE) 10 MG tablet TAKE 1 TABLET BY MOUTH 3  TIMES DAILY 10/20/20   Croitoru, Mihai, MD  isosorbide mononitrate (IMDUR) 30 MG 24 hr tablet TAKE 1 TABLET BY MOUTH  DAILY  10/20/20   Croitoru, Mihai, MD  rosuvastatin (CRESTOR) 10 MG tablet TAKE 1 TABLET BY MOUTH  DAILY 11/30/20   Croitoru, Mihai, MD  TURMERIC PO Take 1,000 mg by mouth daily.    [provider]  vitamin B-12 (CYANOCOBALAMIN) 1000 MCG tablet Take 1,000 mcg by mouth daily.    [provider]  vitamin E 400 UNIT capsule Take 400 Units by mouth daily.    [provider]  fluticasone (FLONASE) 50 MCG/ACT nasal spray Place 2 sprays into the nose daily. 12/16/10 08/12/19  Rowe Clack, MD   Family History Family History  Problem Relation Age of Onset   Parkinson's disease Mother    Asthma Father    Diabetes Sister    Colon cancer Neg Hx  Stomach cancer Neg Hx    Esophageal cancer Neg Hx    Rectal cancer Neg Hx    Liver cancer Neg Hx    Social History Social History   Tobacco Use   Smoking status: Never   Smokeless tobacco: Never   Tobacco comments:    Lives alone-divorced  Vaping Use   Vaping Use: Never used  Substance Use Topics   Alcohol use: Yes    Alcohol/week: 0.0 standard drinks    Comment: occ   Drug use: No   Allergies   Patient has no known allergies.  Review of Systems Review of Systems Pertinent findings noted in history of present illness.   Physical Exam Triage Vital Signs ED Triage Vitals  Enc Vitals Group     BP 03/12/21 0827 (!) 147/82     Pulse Rate 03/12/21 0827 72     Resp 03/12/21 0827 18     Temp 03/12/21 0827 98.3 F (36.8 C)     Temp Source 03/12/21 0827 Oral     SpO2 03/12/21 0827 98 %     Weight --      Height --      Head Circumference --      Peak Flow --      Pain Score 03/12/21 0826 5     Pain Loc --      Pain Edu? --      Excl. in [place]? --   No data found.  Updated Vital Signs BP 136/84 (BP Location: Left Arm)    Pulse 75    Temp 98.2 F (36.8 C) (Oral)    Resp 16    SpO2 100%   Physical Exam Vitals and nursing note reviewed.  Constitutional:      General: She is not in acute distress.     Appearance: Normal appearance. She is not ill-appearing.  HENT:     Head: Normocephalic and atraumatic.     Salivary Glands: Right salivary gland is not diffusely enlarged or tender. Left salivary gland is not diffusely enlarged or tender.     Right Ear: Hearing and external ear normal. There is impacted cerumen.     Left Ear: Hearing, tympanic membrane, ear canal and external ear normal. No drainage.  No middle ear effusion. There is no impacted cerumen. Tympanic membrane is not erythematous or bulging.     Nose: Nose normal. No nasal deformity, septal deviation, mucosal edema, congestion or rhinorrhea.     Right Turbinates: Not enlarged, swollen or pale.     Left Turbinates: Not enlarged, swollen or pale.     Right Sinus: No maxillary sinus tenderness or frontal sinus tenderness.     Left Sinus: No maxillary sinus tenderness or frontal sinus tenderness.     Mouth/Throat:     Lips: Pink. No lesions.     Mouth: Mucous membranes are moist. No oral lesions.     Pharynx: Oropharynx is clear. Uvula midline. No posterior oropharyngeal erythema or uvula swelling.     Tonsils: No tonsillar exudate. 0 on the right. 0 on the left.  Eyes:     General: Lids are normal.        Right eye: No discharge.        Left eye: No discharge.     Extraocular Movements: Extraocular movements intact.     Conjunctiva/sclera: Conjunctivae normal.     Right eye: Right conjunctiva is not injected.     Left eye: Left conjunctiva is not injected.  Neck:  Trachea: Trachea and phonation normal.  Cardiovascular:     Rate and Rhythm: Normal rate and regular rhythm.     Pulses: Normal pulses.     Heart sounds: Normal heart sounds. No murmur heard.   No friction rub. No gallop.  Pulmonary:     Effort: Pulmonary effort is normal. No accessory muscle usage, prolonged expiration or respiratory distress.     Breath sounds: Normal breath sounds. No stridor, decreased air movement or transmitted upper airway sounds. No  decreased breath sounds, wheezing, rhonchi or rales.  Chest:     Chest wall: No tenderness.  Musculoskeletal:        General: Normal range of motion.     Cervical back: Normal range of motion and neck supple. Normal range of motion.  Lymphadenopathy:     Cervical: No cervical adenopathy.  Skin:    General: Skin is warm and dry.     Findings: No erythema or rash.  Neurological:     General: No focal deficit present.     Mental Status: She is alert and oriented to person, place, and time.  Psychiatric:        Mood and Affect: Mood normal.        Behavior: Behavior normal.    Visual Acuity Right Eye Distance:   Left Eye Distance:   Bilateral Distance:    Right Eye Near:   Left Eye Near:    Bilateral Near:     [facility] / Diagnostics / Procedures:    EKG  Radiology No results found.  Procedures Ear Cerumen Removal  Date/Time: 06/08/2021 12:37 PM Performed by: Curt Jews, RN Authorized by: Lynden Oxford Scales, PA-C   Consent:    Consent obtained:  Verbal   Consent given by:  Patient   Risks, benefits, and alternatives were discussed: yes     Risks discussed:  Bleeding, dizziness, incomplete removal, infection, pain and TM perforation   Alternatives discussed:  No treatment, delayed treatment, alternative treatment, observation and referral Universal protocol:    Procedure explained and questions answered to patient or proxy's satisfaction: yes     Patient identity confirmed:  Verbally with patient and arm band Procedure details:    Location:  R ear   Procedure type: irrigation     Procedure outcomes: cerumen removed   Post-procedure details:    Inspection:  No bleeding, ear canal clear and TM intact   Hearing quality:  Normal   Procedure completion:  Tolerated (including critical care time)  UC Diagnoses / Final Clinical Impressions(s)   I have reviewed the triage vital signs and the nursing notes.  Pertinent labs & imaging results that were  available during my care of the patient were reviewed by me and considered in my medical decision making (see chart for details).   Final diagnoses:  Acute otalgia, right  Impacted cerumen of right ear  Acute infective otitis externa, right  Chronic eczematoid otitis externa of right ear   Right ear was irrigated without complication, repeat evaluation of right ear canal revealed maceration, erythema and purulent material at the distal end.  Patient was provided with a prescription for Ciprodex eardrops, advised to use twice a day for 7 days, have also advised patient to begin fluocinolone drops in her ears no more than once daily but best used 2 to 3 days a week.  Patient advised to keep her ear calm, keep wax soft and prevent recurrence.  Return precautions advised.  ED Prescriptions  Medication Sig Dispense Auth. Provider   ciprofloxacin-dexamethasone (CIPRODEX) OTIC suspension Place 4 drops into the right ear 2 (two) times daily for 7 days. 7.5 mL Lynden Oxford Scales, PA-C   Fluocinolone Acetonide 0.01 % OIL  (Status: Discontinued) Place 5 drops in ear(s) daily. 20 mL Lynden Oxford Scales, PA-C   Fluocinolone Acetonide 0.01 % OIL Place 5 drops in ear(s) daily. 20 mL Lynden Oxford Scales, PA-C      PDMP not reviewed this encounter.  Pending results:  Labs Reviewed - No data to display  Medications Ordered in UC: Medications - No data to display  Disposition Upon Discharge:  Condition: stable for discharge home Home: take medications as prescribed; routine discharge instructions as discussed; follow up as advised.  Patient presented with an acute illness with associated systemic symptoms and significant discomfort requiring urgent management. In my opinion, this is a condition that a prudent lay person (someone who possesses an average knowledge of health and medicine) may potentially expect to result in complications if not addressed urgently such as respiratory distress,  impairment of bodily function or dysfunction of bodily organs.   Routine symptom specific, illness specific and/or disease specific instructions were discussed with the patient and/or caregiver at length.   As such, the patient has been evaluated and assessed, work-up was performed and treatment was provided in alignment with urgent care protocols and evidence based medicine.  Patient/parent/caregiver has been advised that the patient may require follow up for further testing and treatment if the symptoms continue in spite of treatment, as clinically indicated and appropriate.  If the patient was tested for COVID-19, Influenza and/or RSV, then the patient/parent/guardian was advised to isolate at home pending the results of his/her diagnostic coronavirus test and potentially longer if theyre positive. I have also advised pt that if his/her COVID-19 test returns positive, it's recommended to self-isolate for at least 10 days after symptoms first appeared AND until fever-free for 24 hours without fever reducer AND other symptoms have improved or resolved. Discussed self-isolation recommendations as well as instructions for household member/close contacts as per the [facility], and also gave patient the [place] packet with this information.  Patient/parent/caregiver has been advised to return to the [facility] or PCP in 3-5 days if no better; to PCP or the Emergency Department if new signs and symptoms develop, or if the current signs or symptoms continue to change or worsen for further workup, evaluation and treatment as clinically indicated and appropriate  The patient will follow up with their current PCP if and as advised. If the patient does not currently have a PCP we will assist them in obtaining one.   The patient may need specialty follow up if the symptoms continue, in spite of conservative treatment and management, for further workup, evaluation, consultation and treatment as clinically indicated  and appropriate.  Patient/parent/caregiver verbalized understanding and agreement of plan as discussed.  All questions were addressed during visit.  Please see discharge instructions below for further details of plan.  Discharge Instructions:   Discharge Instructions      Begin Ciprodex drops now, place 4 drops into your right ear twice daily for the next 7 days.  Once you complete treatment with Ciprodex, please begin fluocinolone acetonide otic oil, 5 drops in your ear daily, please feel free to use this in both ears if you find that it makes your ears comfortable and allows wax to easily come out on its own.  If you would like to  perform a monthly earwax removal at home, I recommend Debrox and a bulb syringe, please follow the instructions on the package.  This works very well.  In the words of my immortal pediatrician, please do not put anything in your ear that is smaller than your elbow.    Thank you for visiting urgent care today.  It was a pleasure to participate in your care.  Please feel free to return anytime.      This office note has been dictated using Museum/gallery curator.  Unfortunately, and despite my best efforts, this method of dictation can sometimes lead to occasional typographical or grammatical errors.  I apologize in advance if this occurs.     Lynden Oxford Scales, PA-C 06/08/21 1300

## 2021-06-08 NOTE — ED Triage Notes (Signed)
Pt presents with earache x 2-3 days.

## 2021-06-08 NOTE — Discharge Instructions (Addendum)
Begin Ciprodex drops now, place 4 drops into your right ear twice daily for the next 7 days.  Once you complete treatment with Ciprodex, please begin fluocinolone acetonide otic oil, 5 drops in your ear daily, please feel free to use this in both ears if you find that it makes your ears comfortable and allows wax to easily come out on its own.  If you would like to perform a monthly earwax removal at home, I recommend Debrox and a bulb syringe, please follow the instructions on the package.  This works very well.  In the words of my immortal pediatrician, please do not put anything in your ear that is smaller than your elbow.    Thank you for visiting urgent care today.  It was a pleasure to participate in your care.  Please feel free to return anytime.

## 2021-06-09 ENCOUNTER — Telehealth: Payer: Self-pay

## 2021-06-09 NOTE — Telephone Encounter (Signed)
Remote ICM transmission received.  Attempted call to patient regarding ICM remote transmission and left message to return call   

## 2021-06-09 NOTE — Progress Notes (Signed)
EPIC Encounter for ICM Monitoring  Patient Name: Patricia Morgan is a 71 y.o. female Date: 06/09/2021 Primary Care Physican: Antony Contras, MD Primary Cardiologist: Croitoru Electrophysiologist: Croitoru Nephrologist: Moshe Cipro 02/24/2021 Weight: 207 lbs     Attempted call to patient and unable to reach.  Left message to return call. Transmission reviewed.    Optivol Thoracic impedance suggesting fluid levels trending close to baseline normal.   Prescribed:  Furosemide 40 mg -  Per patient 08/24/2020, Dr Moshe Cipro has instructed her to take 1 tablet daily and she can double of she has fluid symptoms.     Labs: 08/17/2020 Creatinine 2.63, BUN 39, Potassium 5.0, Sodium 141, GFR 19 A complete set of results can be found in Results Review.   Recommendations:  Unable to reach.     Follow-up plan: ICM clinic phone appointment on 07/12/2021.   91 day device clinic remote transmission 07/05/2021.     EP/Cardiology Office Visits:  Recall 08/21/2021 with Dr. Sallyanne Kuster.     Copy of ICM check sent to Dr. Sallyanne Kuster.   3 month ICM trend: 06/07/2021.    12-14 Month ICM trend:     Rosalene Billings, RN 06/09/2021 2:12 PM

## 2021-06-15 DIAGNOSIS — I129 Hypertensive chronic kidney disease with stage 1 through stage 4 chronic kidney disease, or unspecified chronic kidney disease: Secondary | ICD-10-CM | POA: Diagnosis not present

## 2021-06-15 DIAGNOSIS — E7800 Pure hypercholesterolemia, unspecified: Secondary | ICD-10-CM | POA: Diagnosis not present

## 2021-06-15 DIAGNOSIS — I10 Essential (primary) hypertension: Secondary | ICD-10-CM | POA: Diagnosis not present

## 2021-06-15 DIAGNOSIS — E1122 Type 2 diabetes mellitus with diabetic chronic kidney disease: Secondary | ICD-10-CM | POA: Diagnosis not present

## 2021-06-23 ENCOUNTER — Telehealth: Payer: Self-pay | Admitting: Cardiovascular Disease

## 2021-06-23 MED ORDER — CARVEDILOL 25 MG PO TABS: 12.5000 mg | ORAL_TABLET | Freq: Two times a day (BID) | ORAL | 0 refills | Status: DC

## 2021-06-23 NOTE — Telephone Encounter (Signed)
°*  STAT* If patient is at the pharmacy, call can be transferred to refill team.   1. Which medications need to be refilled? (please list name of each medication and dose if known)  carvedilol (COREG) 25 MG tablet  2. Which pharmacy/location (including street and city if local pharmacy) is medication to be sent to? [id number] - [place], [place] - [address]  3. Do they need a 30 day or 90 day supply? Requesting 30 day Emergency supply, patient is completley out of medication.      *STAT* If patient is at the pharmacy, call can be transferred to refill team.   1. Which medications need to be refilled? (please list name of each medication and dose if known)  carvedilol (COREG) 25 MG tablet  2. Which pharmacy/location (including street and city if local pharmacy) is medication to be sent to? [facility] ([place], [place]  3. Do they need a 30 day or 90 day supply?  90 day supply

## 2021-06-24 ENCOUNTER — Telehealth: Payer: Self-pay | Admitting: Cardiovascular Disease

## 2021-06-24 ENCOUNTER — Other Ambulatory Visit: Payer: Self-pay

## 2021-06-24 MED ORDER — CARVEDILOL 25 MG PO TABS: 12.5000 mg | ORAL_TABLET | Freq: Two times a day (BID) | ORAL | 0 refills | Status: DC

## 2021-06-24 NOTE — Telephone Encounter (Signed)
Can the order be resent the pharmacy saying they didn't get it and patient is completely out. Please advise

## 2021-06-29 DIAGNOSIS — M109 Gout, unspecified: Secondary | ICD-10-CM | POA: Diagnosis not present

## 2021-06-29 DIAGNOSIS — E1122 Type 2 diabetes mellitus with diabetic chronic kidney disease: Secondary | ICD-10-CM | POA: Diagnosis not present

## 2021-06-29 DIAGNOSIS — N183 Chronic kidney disease, stage 3 unspecified: Secondary | ICD-10-CM | POA: Diagnosis not present

## 2021-06-29 DIAGNOSIS — I129 Hypertensive chronic kidney disease with stage 1 through stage 4 chronic kidney disease, or unspecified chronic kidney disease: Secondary | ICD-10-CM | POA: Diagnosis not present

## 2021-06-29 DIAGNOSIS — N189 Chronic kidney disease, unspecified: Secondary | ICD-10-CM | POA: Diagnosis not present

## 2021-07-05 ENCOUNTER — Ambulatory Visit (INDEPENDENT_AMBULATORY_CARE_PROVIDER_SITE_OTHER): Payer: Medicare Other

## 2021-07-05 DIAGNOSIS — I428 Other cardiomyopathies: Secondary | ICD-10-CM

## 2021-07-05 LAB — CUP PACEART REMOTE DEVICE CHECK
Battery Remaining Longevity: 111 mo
Battery Voltage: 3.01 V
Brady Statistic RV Percent Paced: 0 %
Date Time Interrogation Session: 20230220043723
HighPow Impedance: 67 Ohm
Implantable Lead Implant Date: 20191030
Implantable Lead Location: 753860
Implantable Pulse Generator Implant Date: 20191030
Lead Channel Impedance Value: 342 Ohm
Lead Channel Impedance Value: 399 Ohm
Lead Channel Pacing Threshold Amplitude: 0.75 V
Lead Channel Pacing Threshold Pulse Width: 0.4 ms
Lead Channel Sensing Intrinsic Amplitude: 20 mV
Lead Channel Sensing Intrinsic Amplitude: 20 mV
Lead Channel Setting Pacing Amplitude: 2 V
Lead Channel Setting Pacing Pulse Width: 0.4 ms
Lead Channel Setting Sensing Sensitivity: 0.3 mV

## 2021-07-12 ENCOUNTER — Ambulatory Visit (INDEPENDENT_AMBULATORY_CARE_PROVIDER_SITE_OTHER): Payer: Medicare Other

## 2021-07-12 DIAGNOSIS — I5042 Chronic combined systolic (congestive) and diastolic (congestive) heart failure: Secondary | ICD-10-CM

## 2021-07-12 DIAGNOSIS — Z95810 Presence of automatic (implantable) cardiac defibrillator: Secondary | ICD-10-CM

## 2021-07-12 NOTE — Progress Notes (Signed)
Remote ICD transmission.   

## 2021-07-16 ENCOUNTER — Telehealth: Payer: Self-pay

## 2021-07-16 NOTE — Telephone Encounter (Signed)
Remote ICM transmission received.  Attempted call to patient regarding ICM remote transmission and left message per DPR to return call.   

## 2021-07-16 NOTE — Progress Notes (Signed)
EPIC Encounter for ICM Monitoring  Patient Name: Patricia Morgan is a 71 y.o. female Date: 07/16/2021 Primary Care Physican: Antony Contras, MD Primary Cardiologist: Croitoru Electrophysiologist: Croitoru Nephrologist: Moshe Cipro 02/24/2021 Weight: 207 lbs     Attempted call to patient and unable to reach.  Left message to return call. Transmission reviewed.    Optivol Thoracic impedance normal but was suggesting possible fluid accumulation from 2/16-2/22.   Prescribed:  Furosemide 40 mg -  Per patient 08/24/2020, Dr Moshe Cipro has instructed her to take 1 tablet daily and she can double of she has fluid symptoms.     Labs: 08/17/2020 Creatinine 2.63, BUN 39, Potassium 5.0, Sodium 141, GFR 19 A complete set of results can be found in Results Review.   Recommendations:  Unable to reach.     Follow-up plan: ICM clinic phone appointment on 08/16/2021.   91 day device clinic remote transmission 10/04/2021.     EP/Cardiology Office Visits:  Recall 08/21/2021 with Dr. Sallyanne Kuster.     Copy of ICM check sent to Dr. Sallyanne Kuster.   3 month ICM trend: 07/12/2021.    12-14 Month ICM trend:     Rosalene Billings, RN 07/16/2021 3:08 PM

## 2021-07-19 DIAGNOSIS — M810 Age-related osteoporosis without current pathological fracture: Secondary | ICD-10-CM | POA: Diagnosis not present

## 2021-07-19 NOTE — Progress Notes (Addendum)
Spoke with patient and heart failure questions reviewed.  Pt asymptomatic for fluid accumulation.  Reports feeling well at this time and voices no complaints.  Current weight: 207 lbs.   Transmission reviewed and no symptoms during decreased impedance.  She does like to eat at restaurants occasionally.  Discussed having DPR for mailed to her home to add correct phone number to leave detailed messages. She said she gives verbal permission to leave messages on her phone and she lives alone.  No changes and encouraged to call if experiencing any fluid symptoms.

## 2021-07-22 DIAGNOSIS — G4733 Obstructive sleep apnea (adult) (pediatric): Secondary | ICD-10-CM | POA: Diagnosis not present

## 2021-07-30 ENCOUNTER — Other Ambulatory Visit: Payer: Self-pay | Admitting: Cardiovascular Disease

## 2021-08-02 ENCOUNTER — Other Ambulatory Visit: Payer: Self-pay

## 2021-08-02 ENCOUNTER — Telehealth: Payer: Self-pay | Admitting: Cardiovascular Disease

## 2021-08-02 NOTE — Telephone Encounter (Signed)
Pt c/o medication issue: ? ?1. Name of Medication: carvedilol (COREG) 25 MG tablet ? ?2. How are you currently taking this medication (dosage and times per day)? TAKE 1/2 TABLET(12.5 MG) BY MOUTH TWICE DAILY WITH A MEAL ? ?3. Are you having a reaction (difficulty breathing--STAT)? no ? ?4. What is your medication issue? Calling with questions about how this medication is supposed to be taken  ? ?

## 2021-08-02 NOTE — Telephone Encounter (Signed)
Spoke to patient she stated her Coreg prescription directions are incorrect.Stated she has been taking 25 mg twice a day not 12.5 mg twice a day.Stated she tried taking 12.5 mg twice a day and did not control B/P.B/P is normal taking 25 mg twice a day. She has appointment with Dr.Croitoru 4/24 at 8:40 am.Advised I will send message to Dr.Croitoru. ?

## 2021-08-02 NOTE — Telephone Encounter (Signed)
Error

## 2021-08-03 NOTE — Telephone Encounter (Signed)
Called patient left message on personal voice mail Dr.Croitoru advised to continue Coreg 25 mg twice a day.Medication list corrected.Advised to keep appointment already scheduled with Dr.Croitoru 4/24 at 8:40 am. ?

## 2021-08-10 ENCOUNTER — Other Ambulatory Visit: Payer: Self-pay

## 2021-08-16 ENCOUNTER — Ambulatory Visit (INDEPENDENT_AMBULATORY_CARE_PROVIDER_SITE_OTHER): Payer: Medicare Other

## 2021-08-16 DIAGNOSIS — I5042 Chronic combined systolic (congestive) and diastolic (congestive) heart failure: Secondary | ICD-10-CM | POA: Diagnosis not present

## 2021-08-16 DIAGNOSIS — Z95810 Presence of automatic (implantable) cardiac defibrillator: Secondary | ICD-10-CM | POA: Diagnosis not present

## 2021-08-18 NOTE — Progress Notes (Signed)
EPIC Encounter for ICM Monitoring ? ?Patient Name: Patricia Morgan is a 71 y.o. female ?Date: 08/18/2021 ?Primary Care Physican: Antony Contras, MD ?Primary Cardiologist: Croitoru ?Electrophysiologist: Croitoru ?Nephrologist: Moshe Cipro ?08/18/2021 Weight: 203 lbs ?  ?  ?Spoke with patient and heart failure questions reviewed.  Pt asymptomatic for fluid accumulation.  Reports feeling well at this time and voices no complaints.   She had SOB during decreased impedance.  ?  ?Optivol Thoracic impedance normal but was suggesting possible fluid accumulation from 3/15-3/25. ?  ?Prescribed:  ?Furosemide 40 mg -  take 1 tablet twice a day.  Per patient Dr Moshe Cipro has instructed her to take 1 extra tablet if needed for fluid symptoms.   ?  ?Labs: ?08/17/2020 Creatinine 2.63, BUN 39, Potassium 5.0, Sodium 141, GFR 19 ?A complete set of results can be found in Results Review. ?  ?Recommendations:  No changes and encouraged to call if experiencing any fluid symptoms.  ?  ?Follow-up plan: ICM clinic phone appointment on 09/20/2021.   91 day device clinic remote transmission 10/04/2021.   ?  ?EP/Cardiology Office Visits:   09/06/2021 with Dr. Sallyanne Kuster.   ?  ?Copy of ICM check sent to Dr. Sallyanne Kuster.  ? ?3 month ICM trend: 08/16/2021. ? ? ? ?12-14 Month ICM trend:  ? ? ? ?Rosalene Billings, RN ?08/18/2021 ?4:35 PM ? ?

## 2021-08-31 ENCOUNTER — Telehealth: Payer: Self-pay | Admitting: Cardiovascular Disease

## 2021-08-31 NOTE — Telephone Encounter (Signed)
Called pt to let her know the refill was sent on 3/21 to [facility]. If she wants it to be sent to another pharmacy we can do that I just wanted to make sure she knows it was already sent. No answer at this time, left a message for her to return the call.  ?

## 2021-08-31 NOTE — Telephone Encounter (Signed)
?*  STAT* If patient is at the pharmacy, call can be transferred to refill team. ? ? ?1. Which medications need to be refilled? (please list name of each medication and dose if known) carvedilol (COREG) 25 MG tablet ? ?2. Which pharmacy/location (including street and city if local pharmacy) is medication to be sent to? [facility] ([place], [place] ?Please call [phone] ? ?3. Do they need a 30 day or 90 day supply? 90 day   ?

## 2021-09-03 NOTE — Telephone Encounter (Signed)
*  STAT* If patient is at the pharmacy, call can be transferred to refill team.   1. Which medications need to be refilled? (please list name of each medication and dose if known) need a new prescription for Carvedilol 25 mg  2 times a day to local pharmacy [facility]   2. Which pharmacy/location (including street and city if local pharmacy) is medication to be sent to?[facility] [address] [place], [place]  3. Do they need a 30 day or 90 day supply? 30 days-she needs this is today please, she is out of it    Please also call [facility] today about her mail order. Please  call [phone]

## 2021-09-06 ENCOUNTER — Ambulatory Visit (INDEPENDENT_AMBULATORY_CARE_PROVIDER_SITE_OTHER): Payer: Medicare Other | Admitting: Cardiovascular Disease

## 2021-09-06 ENCOUNTER — Encounter: Payer: Self-pay | Admitting: Cardiovascular Disease

## 2021-09-06 VITALS — BP 136/66 | HR 71 | Ht 62.0 in | Wt 204.8 lb

## 2021-09-06 DIAGNOSIS — I5042 Chronic combined systolic (congestive) and diastolic (congestive) heart failure: Secondary | ICD-10-CM

## 2021-09-06 DIAGNOSIS — I428 Other cardiomyopathies: Secondary | ICD-10-CM | POA: Diagnosis not present

## 2021-09-06 DIAGNOSIS — N184 Chronic kidney disease, stage 4 (severe): Secondary | ICD-10-CM

## 2021-09-06 DIAGNOSIS — I10 Essential (primary) hypertension: Secondary | ICD-10-CM

## 2021-09-06 DIAGNOSIS — E119 Type 2 diabetes mellitus without complications: Secondary | ICD-10-CM

## 2021-09-06 DIAGNOSIS — N183 Chronic kidney disease, stage 3 unspecified: Secondary | ICD-10-CM | POA: Diagnosis not present

## 2021-09-06 DIAGNOSIS — E1169 Type 2 diabetes mellitus with other specified complication: Secondary | ICD-10-CM

## 2021-09-06 DIAGNOSIS — Z86718 Personal history of other venous thrombosis and embolism: Secondary | ICD-10-CM

## 2021-09-06 DIAGNOSIS — D6851 Activated protein C resistance: Secondary | ICD-10-CM

## 2021-09-06 DIAGNOSIS — I129 Hypertensive chronic kidney disease with stage 1 through stage 4 chronic kidney disease, or unspecified chronic kidney disease: Secondary | ICD-10-CM | POA: Diagnosis not present

## 2021-09-06 DIAGNOSIS — G4733 Obstructive sleep apnea (adult) (pediatric): Secondary | ICD-10-CM

## 2021-09-06 DIAGNOSIS — Z95810 Presence of automatic (implantable) cardiac defibrillator: Secondary | ICD-10-CM | POA: Diagnosis not present

## 2021-09-06 DIAGNOSIS — E669 Obesity, unspecified: Secondary | ICD-10-CM

## 2021-09-06 DIAGNOSIS — E1122 Type 2 diabetes mellitus with diabetic chronic kidney disease: Secondary | ICD-10-CM | POA: Diagnosis not present

## 2021-09-06 DIAGNOSIS — E785 Hyperlipidemia, unspecified: Secondary | ICD-10-CM

## 2021-09-06 DIAGNOSIS — R9431 Abnormal electrocardiogram [ECG] [EKG]: Secondary | ICD-10-CM

## 2021-09-06 DIAGNOSIS — M109 Gout, unspecified: Secondary | ICD-10-CM | POA: Diagnosis not present

## 2021-09-06 MED ORDER — CARVEDILOL 25 MG PO TABS
25.0000 mg | ORAL_TABLET | Freq: Two times a day (BID) | ORAL | 0 refills | Status: DC
Start: 2021-09-06 — End: 2022-05-27

## 2021-09-06 MED ORDER — CARVEDILOL 25 MG PO TABS: 25.0000 mg | ORAL_TABLET | Freq: Two times a day (BID) | ORAL | 3 refills | Status: DC

## 2021-09-06 NOTE — Patient Instructions (Signed)

## 2021-09-06 NOTE — Progress Notes (Signed)
? ?Cardiology Office Note   ? ?Date:  09/06/2021  ? ?ID:  Patricia Morgan, DOB 13-Jun-1950, MRN [id number] ? ?PCP:  Antony Contras, MD  ?Cardiologist:   Sanda Klein, MD  ? ?No chief complaint on file. ? ? ?History of Present Illness:  ?Patricia Morgan is a 71 y.o. 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 ? ?Continues to do quite well.  Able to climb 2 of these second-floor apartment she lives then several times a day without being limited by dyspnea.  Very mild ankle swelling.  Has not required diuretic dose adjustment recently.  OptiVol also suggests maintaining euvolemic status, has not crossed threshold since a year ago.  She is very careful with her sodium intake.  Has been out of her carvedilol for a few days due to issues with refills, thankfully without any obvious signs of rebound.  Denies orthopnea, PND, palpitations, dizziness, syncope, focal neurologic complaints, gout attacks, falls or bleeding problems. ? ?Renal function has been stable with the most recent creatinine 2.66 on 06/29/2021 (she will have labs later today with Dr. Moshe Cipro, who has referred her to dialysis classes in anticipation of future need for dialysis). ? ?Denies dizziness, palpitations, syncope, angina, orthopnea or PND.  She has not had defibrillator discharges.  She has not had a gout attack in the last 3 years or so. ? ?Her weight today in the office is right at her previous estimation of her baseline of about 205 pounds.  Her office weight is about 7 pounds higher than her home weight due to her heavy breast prosthesis. ? ?Device interrogation shows normal function.  Estimated generator longevity is 9 years.  She has not required ventricular pacing and  has not had any episodes of sustained or nonsustained VT.  OptiVol is in normal range.  Activity stable at 1.6 hours/day, which is improved from the year before.  All lead parameters are normal. ? ? ?Her most recent echo performed in October 2019 showed severely decreased left ventricular systolic function with an ejection fraction of 25%.  A nuclear stress test was performed and shows a fixed inferior wall defect that I suspect is diaphragmatic attenuation artifact.  There was no reversible ischemia.  A similar EF of 22% was reported. ? ?She is compliant with carvedilol in the maximum usual dose and also takes hydralazine/nitrates at the highest dose tolerated by her blood pressure.  She is not receiving RAAS inhibitors due to chronic kidney disease. ? ?Coronary angiography performed June 2017 showed no evidence of coronary artery disease but confirmed elevated filling pressure. PA pressure was 57/18 in the setting of a mean pulmonary wedge pressure of 33 mmHg. Echo showed ejection fraction of 45%. Nuclear scintigraphy calculated ejection fraction of 38%. It also suggested the presence of perfusion abnormalities, false positive by coronary angiography. She sees Dr. Moshe Cipro for chronic kidney disease and her baseline creatinine is around 2.0-2.5. Her primary care physician is Dr. Antony Contras. Dr. Elsworth Soho manages her sleep apnea treatment.  Her sister Patricia Morgan had coronary artery disease and long QT syndrome and passed away in 2017-08-23. ? ?Past Medical History:  ?Diagnosis Date  ? ANEMIA-NOS   ? BREAST CANCER, [place]  ? 1994>>recurrence in 08-23-93, R breast, s/p lumpectomy>>mastectomy  ? Cancer [facility])   ? Cardiomyopathy, nonischemic ([place])   ?  CHF (congestive heart failure) ([place])   ? chronic systolic and diastolic CHF  ? CHRONIC KIDNEY DISEASE STAGE III (MODERATE)   ? Clotting disorder ([place])   ? on eliquis for Factor 5 disorder  ? Colon polyps   ? DIABETES MELLITUS, TYPE II   ? Diverticulosis   ?  HYPERLIPIDEMIA   ? HYPERTENSION   ? ILD (interstitial lung disease) ([place])   ? 10/2018 CT findings suggestive of sarcoidosis; followed by Dr. Elsworth Soho  ? OBSTRUCTIVE SLEEP APNEA   ? CPAP  ? Personal history of chemotherapy   ? Proteinuria   ? PULMONARY EMBOLISM 09/24/2008  ? anticoag thru 03/2010  ? Rotator cuff tear   ? Left  ? Shoulder impingement, left   ? Sleep apnea   ? Wears CPAP nightly  ? ? ?Past Surgical History:  ?Procedure Laterality Date  ? BREAST SURGERY    ? CARDIAC CATHETERIZATION N/A 11/02/2015  ? Procedure: Right/Left Heart Cath and Coronary Angiography;  Surgeon: Burnell Blanks, MD;  Location: [place] [facility];  Service: Cardiovascular;  Laterality: N/A;  ? DILATION AND CURETTAGE OF UTERUS    ? ICD IMPLANT N/A 03/14/2018  ? Procedure: ICD IMPLANT;  Surgeon: Sanda Klein, MD;  Location: [place] [facility];  Service: Cardiovascular;  Laterality: N/A;  ? insertion port a cath    ? MASTECTOMY Right 1995  ? Right  ? PORT-A-CATH REMOVAL    ? SHOULDER ARTHROSCOPY WITH ROTATOR CUFF REPAIR AND SUBACROMIAL DECOMPRESSION Left 01/24/2019  ? Procedure: SHOULDER ARTHROSCOPY WITH ROTATOR CUFF REPAIR AND SUBACROMIAL DECOMPRESSION;  Surgeon: Nicholes Stairs, MD;  Location: [place];  Service: Orthopedics;  Laterality: Left;  2.5 hrs  ? ? ?Current Medications: ?Outpatient Medications Prior to Visit  ?Medication Sig Dispense Refill  ? allopurinol (ZYLOPRIM) 100 MG tablet Take 100 mg by mouth 2 (two) times daily.     ? Cholecalciferol (VITAMIN D-3) 5000 units TABS Take 5,000 Units by mouth daily.     ? diphenhydrAMINE (BENADRYL) 25 MG tablet Take 25 mg by mouth daily.    ? ELIQUIS 5 MG TABS tablet TAKE 1 TABLET TWICE DAILY 180 tablet 6  ? ferrous sulfate 325 (65 FE) MG tablet Take 325 mg by mouth daily with breakfast.    ? furosemide (LASIX) 40 MG tablet Take 2 tablets (80 mg total) by mouth daily. 11/06/2019 Per pt the nephrologist increased Furosemide to 80 mg every AM and 40 mg every afternoon.  She may also  take additional 40 mg in the afternoon for lower extremity swelling.    ? hydrALAZINE (APRESOLINE) 10 MG tablet TAKE 1 TABLET BY MOUTH 3  TIMES DAILY 270 tablet 3  ? isosorbide mononitrate (IMDUR) 30 MG 24 hr tablet TAKE 1 TABLET BY MOUTH  DAILY 90 tablet 3  ? rosuvastatin (CRESTOR) 10 MG tablet TAKE 1 TABLET BY MOUTH  DAILY 90 tablet 3  ? TURMERIC PO Take 1,000 mg by mouth daily.    ? vitamin B-12 (CYANOCOBALAMIN) 1000 MCG tablet Take 1,000 mcg by mouth daily.    ? vitamin E 400 UNIT capsule Take 400 Units by mouth daily.    ? carvedilol (COREG) 25 MG tablet Take 1 tablet (25 mg total) by mouth 2 (two) times daily. 180 tablet 3  ? acetaminophen (TYLENOL) 500 MG tablet Take 500 mg by mouth every 6 (six) hours as needed for mild pain. (Patient not taking: Reported on 09/06/2021)    ? colchicine 0.6 MG tablet Take 0.6 mg by mouth  as needed.  (Patient not taking: Reported on 09/06/2021)  1  ? Fluocinolone Acetonide 0.01 % OIL Place 5 drops in ear(s) daily. (Patient not taking: Reported on 09/06/2021) 20 mL 0  ? ?No facility-administered medications prior to visit.  ?  ? ?Allergies:   Patient has no known allergies.  ? ?Social History  ? ?Socioeconomic History  ? Marital status: Divorced  ?  Spouse name: Not on file  ? Number of children: 2  ? Years of education: 104  ? Highest education level: Not on file  ?Occupational History  ? Occupation: retired  ?Tobacco Use  ? Smoking status: Never  ? Smokeless tobacco: Never  ? Tobacco comments:  ?  Lives alone-divorced  ?Vaping Use  ? Vaping Use: Never used  ?Substance and Sexual Activity  ? Alcohol use: Yes  ?  Alcohol/week: 0.0 standard drinks  ?  Comment: occ  ? Drug use: No  ? Sexual activity: Not on file  ?Other Topics Concern  ? Not on file  ?Social History Narrative  ? Not on file  ? ?Social Determinants of Health  ? ?Financial Resource Strain: Not on file  ?Food Insecurity: Not on file  ?Transportation Needs: Not on file  ?Physical Activity: Not on file  ?Stress: Not on  file  ?Social Connections: Not on file  ?  ? ?Family History:  The patient's family history includes Asthma in her father; Diabetes in her sister; Parkinson's disease in her mother. Her sister had long QT syndrome

## 2021-09-06 NOTE — Telephone Encounter (Signed)
Called patient regarding medication refill left message. ?

## 2021-09-16 ENCOUNTER — Other Ambulatory Visit: Payer: Self-pay | Admitting: Cardiovascular Disease

## 2021-09-17 ENCOUNTER — Encounter: Payer: Medicare Other | Admitting: Vascular Surgery

## 2021-09-17 ENCOUNTER — Other Ambulatory Visit (HOSPITAL_COMMUNITY): Payer: Medicare Other

## 2021-09-17 ENCOUNTER — Encounter (HOSPITAL_COMMUNITY): Payer: Medicare Other

## 2021-09-20 ENCOUNTER — Ambulatory Visit (INDEPENDENT_AMBULATORY_CARE_PROVIDER_SITE_OTHER): Payer: Medicare Other

## 2021-09-20 DIAGNOSIS — Z95810 Presence of automatic (implantable) cardiac defibrillator: Secondary | ICD-10-CM | POA: Diagnosis not present

## 2021-09-20 DIAGNOSIS — I5042 Chronic combined systolic (congestive) and diastolic (congestive) heart failure: Secondary | ICD-10-CM | POA: Diagnosis not present

## 2021-09-21 ENCOUNTER — Encounter: Payer: Self-pay | Admitting: Emergency Medicine

## 2021-09-21 ENCOUNTER — Ambulatory Visit
Admission: EM | Admit: 2021-09-21 | Discharge: 2021-09-21 | Disposition: A | Discharge status: Home / Self Care | Payer: Medicare Other | Attending: Urgent Care | Admitting: Urgent Care

## 2021-09-21 DIAGNOSIS — H65194 Other acute nonsuppurative otitis media, recurrent, right ear: Secondary | ICD-10-CM

## 2021-09-21 DIAGNOSIS — N184 Chronic kidney disease, stage 4 (severe): Secondary | ICD-10-CM | POA: Diagnosis not present

## 2021-09-21 MED ORDER — CEFTRIAXONE SODIUM 500 MG IJ SOLR
500.0000 mg | Freq: Once | INTRAMUSCULAR | Status: AC
  Administered 2021-09-21: 500 mg via INTRAMUSCULAR

## 2021-09-21 MED ORDER — AZITHROMYCIN 250 MG PO TABS: ORAL_TABLET | ORAL | 0 refills | Status: DC

## 2021-09-21 NOTE — Discharge Instructions (Addendum)
Please make sure you follow up with your ENT specialist as soon as possible. Otherwise, I am using an injection of ceftriaxone and oral pills of azithromycin as alternate antibiotic choices due to your severe kidney disease. Do not use Q-tips, ear flushes. Just take the antibiotics I'm prescribing and follow up with your ENT doctor.  ?

## 2021-09-21 NOTE — ED Triage Notes (Signed)
Pt here with right ear pain and fullness that is recurrent for several months. States blood was coming out of her ear this morning after her shower.  ?

## 2021-09-21 NOTE — ED Provider Notes (Signed)
?[place] ? ? ?MRN: [id number] DOB: Aug 05, 1950 ? ?Subjective:  ? ?Patricia Morgan is a 71 y.o. female presenting for acute on chronic right ear pain with drainage and bloody discharge this morning.  It has since stopped.  However she continues to feel right ear pain that radiates into her face.  Has had a longstanding history of persistent ear issues.  She has seen an ENT and multiple ones at that, has not gone back to 1 as she has not had definitive resolution of her symptoms.  Has never had procedures done.  Denies runny or stuffy nose, sore throat, cough, dizziness.  Patient does have CKD stage IV as confirmed from her latest creatinine level a year ago.  She was also recently seen and per patient was prescribed eardrops which she has been given consistently in the past and reports that it never helps her.  Does not want any eardrops for this episode. ? ?No current facility-administered medications for this encounter. ? ?Current Outpatient Medications:  ?  acetaminophen (TYLENOL) 500 MG tablet, Take 500 mg by mouth every 6 (six) hours as needed for mild pain. (Patient not taking: Reported on 09/06/2021), Disp: , Rfl:  ?  allopurinol (ZYLOPRIM) 100 MG tablet, Take 100 mg by mouth 2 (two) times daily. , Disp: , Rfl:  ?  carvedilol (COREG) 25 MG tablet, Take 1 tablet (25 mg total) by mouth 2 (two) times daily., Disp: 60 tablet, Rfl: 0 ?  Cholecalciferol (VITAMIN D-3) 5000 units TABS, Take 5,000 Units by mouth daily. , Disp: , Rfl:  ?  colchicine 0.6 MG tablet, Take 0.6 mg by mouth as needed.  (Patient not taking: Reported on 09/06/2021), Disp: , Rfl: 1 ?  diphenhydrAMINE (BENADRYL) 25 MG tablet, Take 25 mg by mouth daily., Disp: , Rfl:  ?  ELIQUIS 5 MG TABS tablet, TAKE 1 TABLET TWICE DAILY, Disp: 180 tablet, Rfl: 6 ?  ferrous sulfate 325 (65 FE) MG tablet, Take 325 mg by mouth daily with breakfast., Disp: , Rfl:  ?  Fluocinolone Acetonide 0.01 % OIL, Place 5 drops in ear(s) daily.  (Patient not taking: Reported on 09/06/2021), Disp: 20 mL, Rfl: 0 ?  furosemide (LASIX) 40 MG tablet, Take 2 tablets (80 mg total) by mouth daily. 11/06/2019 Per pt the nephrologist increased Furosemide to 80 mg every AM and 40 mg every afternoon.  She may also take additional 40 mg in the afternoon for lower extremity swelling., Disp: , Rfl:  ?  hydrALAZINE (APRESOLINE) 10 MG tablet, TAKE 1 TABLET BY MOUTH 3  TIMES DAILY, Disp: 270 tablet, Rfl: 3 ?  isosorbide mononitrate (IMDUR) 30 MG 24 hr tablet, TAKE 1 TABLET BY MOUTH  DAILY, Disp: 90 tablet, Rfl: 3 ?  rosuvastatin (CRESTOR) 10 MG tablet, TAKE 1 TABLET BY MOUTH  DAILY, Disp: 90 tablet, Rfl: 3 ?  TURMERIC PO, Take 1,000 mg by mouth daily., Disp: , Rfl:  ?  vitamin B-12 (CYANOCOBALAMIN) 1000 MCG tablet, Take 1,000 mcg by mouth daily., Disp: , Rfl:  ?  vitamin E 400 UNIT capsule, Take 400 Units by mouth daily., Disp: , Rfl:   ? ?No Known Allergies ? ?Past Medical History:  ?Diagnosis Date  ? ANEMIA-NOS   ? BREAST CANCER, [place]  ? 1994>>recurrence in 1995, R breast, s/p lumpectomy>>mastectomy  ? Cancer [facility])   ? Cardiomyopathy, nonischemic ([place])   ? CHF (congestive heart failure) ([place])   ? chronic systolic and diastolic CHF  ?  CHRONIC KIDNEY DISEASE STAGE III (MODERATE)   ? Clotting disorder ([place])   ? on eliquis for Factor 5 disorder  ? Colon polyps   ? DIABETES MELLITUS, TYPE II   ? Diverticulosis   ? HYPERLIPIDEMIA   ? HYPERTENSION   ? ILD (interstitial lung disease) ([place])   ? 10/2018 CT findings suggestive of sarcoidosis; followed by Dr. Elsworth Soho  ? OBSTRUCTIVE SLEEP APNEA   ? CPAP  ? Personal history of chemotherapy   ? Proteinuria   ? PULMONARY EMBOLISM 09/24/2008  ? anticoag thru 03/2010  ? Rotator cuff tear   ? Left  ? Shoulder impingement, left   ? Sleep apnea   ? Wears CPAP nightly  ?  ? ?Past Surgical History:  ?Procedure Laterality Date  ? BREAST SURGERY    ? CARDIAC CATHETERIZATION N/A 11/02/2015  ? Procedure: Right/Left Heart Cath and Coronary  Angiography;  Surgeon: Burnell Blanks, MD;  Location: [place] [facility];  Service: Cardiovascular;  Laterality: N/A;  ? DILATION AND CURETTAGE OF UTERUS    ? ICD IMPLANT N/A 03/14/2018  ? Procedure: ICD IMPLANT;  Surgeon: Sanda Klein, MD;  Location: [place] [facility];  Service: Cardiovascular;  Laterality: N/A;  ? insertion port a cath    ? MASTECTOMY Right 1995  ? Right  ? PORT-A-CATH REMOVAL    ? SHOULDER ARTHROSCOPY WITH ROTATOR CUFF REPAIR AND SUBACROMIAL DECOMPRESSION Left 01/24/2019  ? Procedure: SHOULDER ARTHROSCOPY WITH ROTATOR CUFF REPAIR AND SUBACROMIAL DECOMPRESSION;  Surgeon: Nicholes Stairs, MD;  Location: [place];  Service: Orthopedics;  Laterality: Left;  2.5 hrs  ? ? ?Family History  ?Problem Relation Age of Onset  ? Parkinson's disease Mother   ? Asthma Father   ? Diabetes Sister   ? Colon cancer Neg Hx   ? Stomach cancer Neg Hx   ? Esophageal cancer Neg Hx   ? Rectal cancer Neg Hx   ? Liver cancer Neg Hx   ? ? ?Social History  ? ?Tobacco Use  ? Smoking status: Never  ? Smokeless tobacco: Never  ? Tobacco comments:  ?  Lives alone-divorced  ?Vaping Use  ? Vaping Use: Never used  ?Substance Use Topics  ? Alcohol use: Yes  ?  Alcohol/week: 0.0 standard drinks  ?  Comment: occ  ? Drug use: No  ? ? ?ROS ? ? ?Objective:  ? ?Vitals: ?BP 133/72   Pulse 81   Temp 98.7 ?F (37.1 ?C)   Resp 20   SpO2 98%  ? ?Physical Exam ?Constitutional:   ?   General: She is not in acute distress. ?   Appearance: Normal appearance. She is well-developed and normal weight. She is not ill-appearing, toxic-appearing or diaphoretic.  ?HENT:  ?   Head: Normocephalic and atraumatic.  ?   Right Ear: Ear canal and external ear normal. No drainage or tenderness. No middle ear effusion. There is no impacted cerumen. Tympanic membrane is not erythematous.  ?   Left Ear: Tympanic membrane, ear canal and external ear normal. No drainage or tenderness.  No middle ear effusion. There is no impacted cerumen. Tympanic  membrane is not erythematous.  ?   Ears:  ?   Comments: TMs opacified and bulging but without erythema, no ruptures. ?   Nose: No congestion or rhinorrhea.  ?   Mouth/Throat:  ?   Mouth: Mucous membranes are moist. No oral lesions.  ?   Pharynx: No pharyngeal swelling, oropharyngeal exudate, posterior oropharyngeal erythema or uvula swelling.  ?  Tonsils: No tonsillar exudate or tonsillar abscesses.  ?Eyes:  ?   General: No scleral icterus.    ?   Right eye: No discharge.     ?   Left eye: No discharge.  ?   Extraocular Movements: Extraocular movements intact.  ?   Right eye: Normal extraocular motion.  ?   Left eye: Normal extraocular motion.  ?   Conjunctiva/sclera: Conjunctivae normal.  ?Cardiovascular:  ?   Rate and Rhythm: Normal rate.  ?Pulmonary:  ?   Effort: Pulmonary effort is normal.  ?Musculoskeletal:  ?   Cervical back: Normal range of motion and neck supple.  ?Lymphadenopathy:  ?   Cervical: No cervical adenopathy.  ?Skin: ?   General: Skin is warm and dry.  ?Neurological:  ?   General: No focal deficit present.  ?   Mental Status: She is alert and oriented to person, place, and time.  ?Psychiatric:     ?   Mood and Affect: Mood normal.     ?   Behavior: Behavior normal.  ? ? ?Assessment and Plan :  ? ?PDMP not reviewed this encounter. ? ?1. Other recurrent acute nonsuppurative otitis media of right ear   ?2. Chronic kidney disease (CKD), stage IV (severe) (HCC)   ? ?Low suspicion for an otitis externa given her ear exam.  However, she does have a recurrent otitis media.  Her CKD 4 limits her antibiotic choices.  As such I recommended dual therapy with IM ceftriaxone and azithromycin.  She does have a history of diabetes but no longer has to take medications for this and therefore these antibiotics may be more appropriate for her.  I did emphasize that she needs to follow-up with her ENT specialist as soon as possible for recheck.  Counseled patient on potential for adverse effects with medications  prescribed/recommended today, ER and return-to-clinic precautions discussed, patient verbalized understanding. ? ?  ?Jaynee Eagles, PA-C ?09/21/21 1744 ? ?

## 2021-09-22 ENCOUNTER — Other Ambulatory Visit: Payer: Self-pay | Admitting: *Deleted

## 2021-09-22 DIAGNOSIS — H60331 Swimmer's ear, right ear: Secondary | ICD-10-CM | POA: Diagnosis not present

## 2021-09-22 DIAGNOSIS — H60333 Swimmer's ear, bilateral: Secondary | ICD-10-CM | POA: Diagnosis not present

## 2021-09-22 DIAGNOSIS — H938X2 Other specified disorders of left ear: Secondary | ICD-10-CM | POA: Diagnosis not present

## 2021-09-22 DIAGNOSIS — N184 Chronic kidney disease, stage 4 (severe): Secondary | ICD-10-CM

## 2021-09-23 NOTE — Progress Notes (Signed)
EPIC Encounter for ICM Monitoring ? ?Patient Name: Patricia Morgan is a 71 y.o. female ?Date: 09/23/2021 ?Primary Care Physican: Antony Contras, MD ?Primary Cardiologist: Croitoru ?Electrophysiologist: Croitoru ?Nephrologist: Moshe Cipro ?08/18/2021 Weight: 203 lbs ?  ?  ?Spoke with patient and heart failure questions reviewed.  Pt asymptomatic for fluid accumulation.  Reports feeling well at this time and voices no complaints.   5/9 ED visit for ear sx. ?  ?Optivol Thoracic impedance suggesting normal fluid levels. ?  ?Prescribed:  ?Furosemide 40 mg -  take 1 tablet twice a day.  Per patient Dr Moshe Cipro has instructed her to take 1 extra tablet if needed for fluid symptoms.   ?  ?Labs: ?08/17/2020 Creatinine 2.63, BUN 39, Potassium 5.0, Sodium 141, GFR 19 ?A complete set of results can be found in Results Review. ?  ?Recommendations:  No changes and encouraged to call if experiencing any fluid symptoms.  ?  ?Follow-up plan: ICM clinic phone appointment on 10/25/2021.   91 day device clinic remote transmission 10/04/2021.   ?  ?EP/Cardiology Office Visits:   Recall 09/01/2022 with Dr. Sallyanne Kuster.   ?  ?Copy of ICM check sent to Dr. Sallyanne Kuster.  ? ?3 month ICM trend: 09/20/2021. ? ? ? ?12-14 Month ICM trend:  ? ? ? ?Rosalene Billings, RN ?09/23/2021 ?8:11 AM ? ?

## 2021-09-30 NOTE — Progress Notes (Signed)
Office Note     CC:  CKD Requesting Provider:  Corliss Parish, MD  HPI: Patricia Morgan is a Right handed 71 y.o. (01/30/1951) female with kidney disease who presents at the request of Corliss Parish, MD for permanent HD access.  Other medical history includes right-sided breast cancer requiring mastectomy and lymph node dissection, defibrillator on the left.  Patient has had no prior access procedures no previous tunneled lines, currently not on dialysis.  On exam, Patricia Morgan was doing well.  A native of [place], she moved down to [place] to raise her family.  Now both of her children are back in [place] for work.  She continues to live independently, and has a grandson in the area.  Patricia Morgan had questions regarding access surgery, namely if she could undergo home hemodialysis.  Currently on anticoagulation status post DVT 2 years ago.  Past Medical History:  Diagnosis Date   ANEMIA-NOS    BREAST CANCER, HX OF 1994 & 1995   1994>>recurrence in 1995, R breast, s/p lumpectomy>>mastectomy   Cancer ([place])    Cardiomyopathy, nonischemic (HCC)    CHF (congestive heart failure) (HCC)    chronic systolic and diastolic CHF   CHRONIC KIDNEY DISEASE STAGE III (MODERATE)    Clotting disorder (HCC)    on eliquis for Factor 5 disorder   Colon polyps    DIABETES MELLITUS, TYPE II    Diverticulosis    HYPERLIPIDEMIA    HYPERTENSION    ILD (interstitial lung disease) ([place])    10/2018 CT findings suggestive of sarcoidosis; followed by Dr. Elsworth Soho   OBSTRUCTIVE SLEEP APNEA    CPAP   Personal history of chemotherapy    Proteinuria    PULMONARY EMBOLISM 09/24/2008   anticoag thru 03/2010   Rotator cuff tear    Left   Shoulder impingement, left    Sleep apnea    Wears CPAP nightly    Past Surgical History:  Procedure Laterality Date   BREAST SURGERY     CARDIAC CATHETERIZATION N/A 11/02/2015   Procedure: Right/Left Heart Cath and Coronary Angiography;  Surgeon: Burnell Blanks, MD;  Location: [facility];  Service: Cardiovascular;  Laterality: N/A;   DILATION AND CURETTAGE OF UTERUS     ICD IMPLANT N/A 03/14/2018   Procedure: ICD IMPLANT;  Surgeon: Sanda Klein, MD;  Location: [facility];  Service: Cardiovascular;  Laterality: N/A;   insertion port a cath     MASTECTOMY Right 1995   Right   PORT-A-CATH REMOVAL     SHOULDER ARTHROSCOPY WITH ROTATOR CUFF REPAIR AND SUBACROMIAL DECOMPRESSION Left 01/24/2019   Procedure: SHOULDER ARTHROSCOPY WITH ROTATOR CUFF REPAIR AND SUBACROMIAL DECOMPRESSION;  Surgeon: Nicholes Stairs, MD;  Location: [facility];  Service: Orthopedics;  Laterality: Left;  2.5 hrs    Social History   Socioeconomic History   Marital status: Divorced    Spouse name: Not on file   Number of children: 2   Years of education: 12   Highest education level: Not on file  Occupational History   Occupation: retired  Tobacco Use   Smoking status: Never   Smokeless tobacco: Never   Tobacco comments:    Lives alone-divorced  Vaping Use   Vaping Use: Never used  Substance and Sexual Activity   Alcohol use: Yes    Alcohol/week: 0.0 standard drinks    Comment: occ   Drug use: No   Sexual activity: Not on file  Other Topics Concern   Not  on file  Social History Narrative   Not on file   Social Determinants of Health   Financial Resource Strain: Not on file  Food Insecurity: Not on file  Transportation Needs: Not on file  Physical Activity: Not on file  Stress: Not on file  Social Connections: Not on file  Intimate Partner Violence: Not on file   Family History  Problem Relation Age of Onset   Parkinson's disease Mother    Asthma Father    Diabetes Sister    Colon cancer Neg Hx    Stomach cancer Neg Hx    Esophageal cancer Neg Hx    Rectal cancer Neg Hx    Liver cancer Neg Hx     Current Outpatient Medications  Medication Sig Dispense Refill   allopurinol (ZYLOPRIM) 100 MG tablet Take 100 mg by mouth 2  (two) times daily.      azithromycin (ZITHROMAX) 250 MG tablet Day 1: take 2 tablets. Day 2-5: Take 1 tablet daily. 6 tablet 0   carvedilol (COREG) 25 MG tablet Take 1 tablet (25 mg total) by mouth 2 (two) times daily. 60 tablet 0   Cholecalciferol (VITAMIN D-3) 5000 units TABS Take 5,000 Units by mouth daily.      diphenhydrAMINE (BENADRYL) 25 MG tablet Take 25 mg by mouth daily.     ELIQUIS 5 MG TABS tablet TAKE 1 TABLET TWICE DAILY 180 tablet 6   ferrous sulfate 325 (65 FE) MG tablet Take 325 mg by mouth daily with breakfast.     furosemide (LASIX) 40 MG tablet Take 2 tablets (80 mg total) by mouth daily. 11/06/2019 Per pt the nephrologist increased Furosemide to 80 mg every AM and 40 mg every afternoon.  She may also take additional 40 mg in the afternoon for lower extremity swelling.     hydrALAZINE (APRESOLINE) 10 MG tablet TAKE 1 TABLET BY MOUTH 3  TIMES DAILY 270 tablet 3   isosorbide mononitrate (IMDUR) 30 MG 24 hr tablet TAKE 1 TABLET BY MOUTH  DAILY 90 tablet 3   rosuvastatin (CRESTOR) 10 MG tablet TAKE 1 TABLET BY MOUTH  DAILY 90 tablet 3   TURMERIC PO Take 1,000 mg by mouth daily.     vitamin B-12 (CYANOCOBALAMIN) 1000 MCG tablet Take 1,000 mcg by mouth daily.     vitamin E 400 UNIT capsule Take 400 Units by mouth daily.     acetaminophen (TYLENOL) 500 MG tablet Take 500 mg by mouth every 6 (six) hours as needed for mild pain. (Patient not taking: Reported on 09/06/2021)     colchicine 0.6 MG tablet Take 0.6 mg by mouth as needed.  (Patient not taking: Reported on 09/06/2021)  1   Fluocinolone Acetonide 0.01 % OIL Place 5 drops in ear(s) daily. (Patient not taking: Reported on 09/06/2021) 20 mL 0   No current facility-administered medications for this visit.    No Known Allergies   REVIEW OF SYSTEMS:  '[X]'$  denotes positive finding, '[ ]'$  denotes negative finding Cardiac  Comments:  Chest pain or chest pressure:    Shortness of breath upon exertion:    Short of breath when lying  flat:    Irregular heart rhythm:        Vascular    Pain in calf, thigh, or hip brought on by ambulation:    Pain in feet at night that wakes you up from your sleep:     Blood clot in your veins:    Leg swelling:  Pulmonary    Oxygen at home:    Productive cough:     Wheezing:         Neurologic    Sudden weakness in arms or legs:     Sudden numbness in arms or legs:     Sudden onset of difficulty speaking or slurred speech:    Temporary loss of vision in one eye:     Problems with dizziness:         Gastrointestinal    Blood in stool:     Vomited blood:         Genitourinary    Burning when urinating:     Blood in urine:        Psychiatric    Major depression:         Hematologic    Bleeding problems:    Problems with blood clotting too easily:        Skin    Rashes or ulcers:        Constitutional    Fever or chills:      PHYSICAL EXAMINATION:  Vitals:   10/01/21 1141  BP: 130/83  Pulse: 78  Resp: 20  Temp: 97.7 F (36.5 C)  SpO2: 97%  Weight: 209 lb (94.8 kg)  Height: '5\' 2"'$  (1.575 m)    General:  WDWN in NAD; vital signs documented above Gait: Not observed HENT: WNL, normocephalic Pulmonary: normal non-labored breathing , without Rales, rhonchi,  wheezing Cardiac: regular HR, Abdomen: soft, NT, no masses Skin: without rashes Vascular Exam/Pulses:  Right Left  Radial 2+ (normal) 2+ (normal)  Ulnar 2+ (normal) 2+ (normal)                   Extremities: without ischemic changes, without Gangrene , without cellulitis; without open wounds;  Musculoskeletal: no muscle wasting or atrophy  Neurologic: A&O X 3;  No focal weakness or paresthesias are detected Psychiatric:  The pt has Normal affect.   Non-Invasive Vascular Imaging:   +-----------------+-------------+----------+-----------+  Right Cephalic   Diameter (cm)Depth (cm) Findings    +-----------------+-------------+----------+-----------+  Shoulder             0.56                             +-----------------+-------------+----------+-----------+  Prox upper arm       0.47                            +-----------------+-------------+----------+-----------+  Mid upper arm        0.31               branch 0.39  +-----------------+-------------+----------+-----------+  Dist upper arm       0.26               branch 0.34  +-----------------+-------------+----------+-----------+  Antecubital fossa    0.40                            +-----------------+-------------+----------+-----------+  Prox forearm         0.20                            +-----------------+-------------+----------+-----------+  Mid forearm          0.12                            +-----------------+-------------+----------+-----------+  Dist forearm         0.07                            +-----------------+-------------+----------+-----------+   +-----------------+-------------+----------+--------+  Right Basilic    Diameter (cm)Depth (cm)Findings  +-----------------+-------------+----------+--------+  Mid upper arm        0.39                         +-----------------+-------------+----------+--------+  Dist upper arm       0.38                         +-----------------+-------------+----------+--------+  Antecubital fossa    0.38                         +-----------------+-------------+----------+--------+   +-----------------+-------------+----------+--------+  Left Cephalic    Diameter (cm)Depth (cm)Findings  +-----------------+-------------+----------+--------+  Shoulder             0.33                         +-----------------+-------------+----------+--------+  Prox upper arm       0.34                         +-----------------+-------------+----------+--------+  Mid upper arm        0.34                         +-----------------+-------------+----------+--------+  Dist upper arm        0.33                         +-----------------+-------------+----------+--------+  Antecubital fossa    0.49                         +-----------------+-------------+----------+--------+  Prox forearm         0.34                         +-----------------+-------------+----------+--------+  Mid forearm          0.31                         +-----------------+-------------+----------+--------+  Dist forearm         0.23                         +-----------------+-------------+----------+--------+   +-----------------+-------------+----------+--------+  Left Basilic     Diameter (cm)Depth (cm)Findings  +-----------------+-------------+----------+--------+  Prox upper arm       0.38                         +-----------------+-------------+----------+--------+  Mid upper arm        0.34                         +-----------------+-------------+----------+--------+  Dist upper arm       0.37                         +-----------------+-------------+----------+--------+  Antecubital fossa    0.28                         +-----------------+-------------+----------+--------+  ASSESSMENT/PLAN:  Patricia Morgan is a 72 y.o. female who presents with chronic kidney disease stage 4  Based on vein mapping and examination, patient has adequate superficial veins bilaterally.  Being that she is right-handed, and has had a previous mastectomy with lymph node dissection, she would be best served with left-sided fistula creation.  She is aware that with her left-sided defibrillator, there could be central stenosis, however has no signs or symptoms on physical exam. I had an extensive discussion with this patient in regards to the nature of access surgery, including risk, benefits, and alternatives.   The patient is aware that the risks of access surgery include but are not limited to: bleeding, infection, steal syndrome, nerve damage, ischemic monomelic  neuropathy, failure of access to mature, complications related to venous hypertension, and possible need for additional access procedures in the future. After discussing the risk and benefits of left-sided brachiocephalic fistula creation, Patricia Morgan elected to proceed. She will need Lovenox bridge due to previous DVT.  Broadus John, MD [facility] [phone]

## 2021-10-01 ENCOUNTER — Ambulatory Visit (INDEPENDENT_AMBULATORY_CARE_PROVIDER_SITE_OTHER): Payer: Medicare Other | Admitting: Vascular Surgery

## 2021-10-01 ENCOUNTER — Ambulatory Visit (HOSPITAL_COMMUNITY)
Admission: RE | Admit: 2021-10-01 | Discharge: 2021-10-01 | Disposition: A | Discharge status: Home / Self Care | Payer: Medicare Other | Source: Ambulatory Visit | Attending: Vascular Surgery | Admitting: Vascular Surgery

## 2021-10-01 ENCOUNTER — Ambulatory Visit (INDEPENDENT_AMBULATORY_CARE_PROVIDER_SITE_OTHER)
Admission: RE | Admit: 2021-10-01 | Discharge: 2021-10-01 | Disposition: A | Discharge status: Home / Self Care | Payer: Medicare Other | Source: Ambulatory Visit | Attending: Vascular Surgery | Admitting: Vascular Surgery

## 2021-10-01 VITALS — BP 130/83 | HR 78 | Temp 97.7°F | Resp 20 | Ht 62.0 in | Wt 209.0 lb

## 2021-10-01 DIAGNOSIS — N184 Chronic kidney disease, stage 4 (severe): Secondary | ICD-10-CM | POA: Diagnosis not present

## 2021-10-01 DIAGNOSIS — H60333 Swimmer's ear, bilateral: Secondary | ICD-10-CM | POA: Diagnosis not present

## 2021-10-01 DIAGNOSIS — L299 Pruritus, unspecified: Secondary | ICD-10-CM | POA: Diagnosis not present

## 2021-10-04 ENCOUNTER — Ambulatory Visit (INDEPENDENT_AMBULATORY_CARE_PROVIDER_SITE_OTHER): Payer: Medicare Other

## 2021-10-04 DIAGNOSIS — I428 Other cardiomyopathies: Secondary | ICD-10-CM

## 2021-10-05 LAB — CUP PACEART REMOTE DEVICE CHECK
Battery Remaining Longevity: 107 mo
Battery Voltage: 3.01 V
Brady Statistic RV Percent Paced: 0 %
Date Time Interrogation Session: 20230522002208
HighPow Impedance: 75 Ohm
Implantable Lead Implant Date: 20191030
Implantable Lead Location: 753860
Implantable Pulse Generator Implant Date: 20191030
Lead Channel Impedance Value: 342 Ohm
Lead Channel Impedance Value: 361 Ohm
Lead Channel Pacing Threshold Amplitude: 0.75 V
Lead Channel Pacing Threshold Pulse Width: 0.4 ms
Lead Channel Sensing Intrinsic Amplitude: 26.5 mV
Lead Channel Sensing Intrinsic Amplitude: 26.5 mV
Lead Channel Setting Pacing Amplitude: 2 V
Lead Channel Setting Pacing Pulse Width: 0.4 ms
Lead Channel Setting Sensing Sensitivity: 0.3 mV

## 2021-10-20 DIAGNOSIS — G4733 Obstructive sleep apnea (adult) (pediatric): Secondary | ICD-10-CM | POA: Diagnosis not present

## 2021-10-20 NOTE — Progress Notes (Signed)
Remote ICD transmission.   

## 2021-10-25 ENCOUNTER — Ambulatory Visit (INDEPENDENT_AMBULATORY_CARE_PROVIDER_SITE_OTHER): Payer: Medicare Other

## 2021-10-25 DIAGNOSIS — I5042 Chronic combined systolic (congestive) and diastolic (congestive) heart failure: Secondary | ICD-10-CM

## 2021-10-25 DIAGNOSIS — Z95810 Presence of automatic (implantable) cardiac defibrillator: Secondary | ICD-10-CM | POA: Diagnosis not present

## 2021-10-26 NOTE — Progress Notes (Signed)
EPIC Encounter for ICM Monitoring  Patient Name: Patricia Morgan is a 71 y.o. female Date: 10/26/2021 Primary Care Physican: Antony Contras, MD Primary Cardiologist: Croitoru Electrophysiologist: Croitoru Nephrologist: Dr Marny Lowenstein at [facility] (Sept 1st visit) 08/18/2021 Weight: 203 lbs 10/25/2021 Weight: 200 lbs     Spoke with patient and heart failure questions reviewed.  Pt reported she decreased Furosemide to 1 tablet daily at the end of the May because she was worried about kidney function.  She began to have SOB and swelling and restarted Furosemide 40 mg twice a day last week.    Optivol Thoracic impedance suggesting possible fluid accumulation starting 5/29.   Prescribed:  Furosemide 40 mg -  take 1 tablet twice a day.  Per patient Dr Moshe Cipro has instructed her to take 1 extra tablet if needed for fluid symptoms.     Labs: 08/17/2020 Creatinine 2.63, BUN 39, Potassium 5.0, Sodium 141, GFR 19 A complete set of results can be found in Results Review.   Recommendations:   She will take extra 20 mg of Furosemide as she has previously discussed with Dr Moshe Cipro.     Follow-up plan: ICM clinic phone appointment on 10/29/2021 to recheck fluid levels.   91 day device clinic remote transmission 01/03/2022.     EP/Cardiology Office Visits:   Recall 09/01/2022 with Dr. Sallyanne Kuster.     Copy of ICM check sent to Dr. Sallyanne Kuster.   3 month ICM trend: 10/25/2021.    12-14 Month ICM trend:     Rosalene Billings, RN 10/26/2021 9:15 AM

## 2021-10-29 ENCOUNTER — Ambulatory Visit (INDEPENDENT_AMBULATORY_CARE_PROVIDER_SITE_OTHER): Payer: Medicare Other

## 2021-10-29 DIAGNOSIS — I5042 Chronic combined systolic (congestive) and diastolic (congestive) heart failure: Secondary | ICD-10-CM

## 2021-10-29 DIAGNOSIS — Z95810 Presence of automatic (implantable) cardiac defibrillator: Secondary | ICD-10-CM

## 2021-10-29 NOTE — Progress Notes (Signed)
EPIC Encounter for ICM Monitoring  Patient Name: Patricia Morgan is a 71 y.o. female Date: 10/29/2021 Primary Care Physican: Antony Contras, MD Primary Cardiologist: Croitoru Electrophysiologist: Croitoru Nephrologist: Dr Marny Lowenstein at [facility] (Sept 1st visit) 08/18/2021 Weight: 203 lbs 10/25/2021 Weight: 200 lbs 10/29/2021 Weight: 199 lbs     Spoke with patient and heart failure questions reviewed.  Pt resumed taking correct dosage of Furosemide as prescribed and encouraged to call physician before decreasing dosage in the future.    Optivol Thoracic impedance suggesting fluid levels returned to normal after taking correct prescribed dosage Furosemide and taking extra tablet x 1 day.   Prescribed:  Furosemide 40 mg -  take 1 tablet twice a day.  Per patient Dr Moshe Cipro has instructed her to take 1 extra tablet if needed for fluid symptoms.     Labs: 08/17/2020 Creatinine 2.63, BUN 39, Potassium 5.0, Sodium 141, GFR 19 A complete set of results can be found in Results Review.   Recommendations:   Continue with Furosemide 40 mg bid and encouraged to call if experiencing any fluid symptoms.     Follow-up plan: ICM clinic phone appointment on 11/29/2021.   91 day device clinic remote transmission 01/03/2022.     EP/Cardiology Office Visits:   Recall 09/01/2022 with Dr. Sallyanne Kuster.     Copy of ICM check sent to Dr. Sallyanne Kuster.   3 month ICM trend: 10/29/2021.    12-14 Month ICM trend:     Rosalene Billings, RN 10/29/2021 9:24 AM

## 2021-11-01 ENCOUNTER — Other Ambulatory Visit: Payer: Self-pay | Admitting: Cardiovascular Disease

## 2021-11-04 DIAGNOSIS — M810 Age-related osteoporosis without current pathological fracture: Secondary | ICD-10-CM | POA: Diagnosis not present

## 2021-11-04 DIAGNOSIS — E7800 Pure hypercholesterolemia, unspecified: Secondary | ICD-10-CM | POA: Diagnosis not present

## 2021-11-04 DIAGNOSIS — N183 Chronic kidney disease, stage 3 unspecified: Secondary | ICD-10-CM | POA: Diagnosis not present

## 2021-11-04 DIAGNOSIS — E1122 Type 2 diabetes mellitus with diabetic chronic kidney disease: Secondary | ICD-10-CM | POA: Diagnosis not present

## 2021-11-04 DIAGNOSIS — I5020 Unspecified systolic (congestive) heart failure: Secondary | ICD-10-CM | POA: Diagnosis not present

## 2021-11-04 DIAGNOSIS — I10 Essential (primary) hypertension: Secondary | ICD-10-CM | POA: Diagnosis not present

## 2021-11-11 DIAGNOSIS — N184 Chronic kidney disease, stage 4 (severe): Secondary | ICD-10-CM | POA: Diagnosis not present

## 2021-11-11 DIAGNOSIS — I428 Other cardiomyopathies: Secondary | ICD-10-CM | POA: Diagnosis not present

## 2021-11-11 DIAGNOSIS — M109 Gout, unspecified: Secondary | ICD-10-CM | POA: Diagnosis not present

## 2021-11-11 DIAGNOSIS — G4730 Sleep apnea, unspecified: Secondary | ICD-10-CM | POA: Diagnosis not present

## 2021-11-11 DIAGNOSIS — D649 Anemia, unspecified: Secondary | ICD-10-CM | POA: Diagnosis not present

## 2021-11-11 DIAGNOSIS — D6869 Other thrombophilia: Secondary | ICD-10-CM | POA: Diagnosis not present

## 2021-11-11 DIAGNOSIS — E7800 Pure hypercholesterolemia, unspecified: Secondary | ICD-10-CM | POA: Diagnosis not present

## 2021-11-11 DIAGNOSIS — D6851 Activated protein C resistance: Secondary | ICD-10-CM | POA: Diagnosis not present

## 2021-11-11 DIAGNOSIS — I129 Hypertensive chronic kidney disease with stage 1 through stage 4 chronic kidney disease, or unspecified chronic kidney disease: Secondary | ICD-10-CM | POA: Diagnosis not present

## 2021-11-11 DIAGNOSIS — M810 Age-related osteoporosis without current pathological fracture: Secondary | ICD-10-CM | POA: Diagnosis not present

## 2021-11-11 DIAGNOSIS — E1122 Type 2 diabetes mellitus with diabetic chronic kidney disease: Secondary | ICD-10-CM | POA: Diagnosis not present

## 2021-11-12 ENCOUNTER — Other Ambulatory Visit: Payer: Self-pay | Admitting: Family Medicine

## 2021-11-12 DIAGNOSIS — M810 Age-related osteoporosis without current pathological fracture: Secondary | ICD-10-CM

## 2021-11-25 DIAGNOSIS — C50911 Malignant neoplasm of unspecified site of right female breast: Secondary | ICD-10-CM | POA: Diagnosis not present

## 2021-11-29 ENCOUNTER — Ambulatory Visit (INDEPENDENT_AMBULATORY_CARE_PROVIDER_SITE_OTHER): Payer: Medicare Other

## 2021-11-29 DIAGNOSIS — I5042 Chronic combined systolic (congestive) and diastolic (congestive) heart failure: Secondary | ICD-10-CM

## 2021-11-29 DIAGNOSIS — Z95810 Presence of automatic (implantable) cardiac defibrillator: Secondary | ICD-10-CM

## 2021-12-02 ENCOUNTER — Encounter: Payer: Self-pay | Admitting: Internal Medicine

## 2021-12-02 DIAGNOSIS — C50911 Malignant neoplasm of unspecified site of right female breast: Secondary | ICD-10-CM | POA: Diagnosis not present

## 2021-12-03 NOTE — Progress Notes (Signed)
EPIC Encounter for ICM Monitoring  Patient Name: Patricia Morgan is a 71 y.o. female Date: 12/03/2021 Primary Care Physican: Antony Contras, MD Primary Cardiologist: Croitoru Electrophysiologist: Croitoru Nephrologist: Dr Marny Lowenstein at [facility] (Sept 1st visit) 08/18/2021 Weight: 203 lbs 10/25/2021 Weight: 200 lbs 10/29/2021 Weight: 199 lbs 12/13/2021 Weight: 196 lbs     Spoke with patient and heart failure questions reviewed.  Pt asymptomatic for fluid accumulation.  Reports feeling well at this time and voices no complaints.    Optivol Thoracic impedance suggesting normal fluid levels.   Prescribed:  Furosemide 40 mg -  take 1 tablet twice a day.  Per patient Dr Moshe Cipro has instructed her to take 1 extra tablet if needed for fluid symptoms.     Labs: 08/17/2020 Creatinine 2.63, BUN 39, Potassium 5.0, Sodium 141, GFR 19 A complete set of results can be found in Results Review.   Recommendations:  No changes and encouraged to call if experiencing any fluid symptoms.   Follow-up plan: ICM clinic phone appointment on 01/04/2022.   91 day device clinic remote transmission 01/03/2022.     EP/Cardiology Office Visits:   Recall 09/01/2022 with Dr. Sallyanne Kuster.     Copy of ICM check sent to Dr. Sallyanne Kuster.   3 month ICM trend: 11/29/2021.    12-14 Month ICM trend:     Rosalene Billings, RN 12/03/2021 2:42 PM

## 2021-12-08 DIAGNOSIS — C50911 Malignant neoplasm of unspecified site of right female breast: Secondary | ICD-10-CM | POA: Diagnosis not present

## 2021-12-10 DIAGNOSIS — E7800 Pure hypercholesterolemia, unspecified: Secondary | ICD-10-CM | POA: Diagnosis not present

## 2021-12-10 DIAGNOSIS — I10 Essential (primary) hypertension: Secondary | ICD-10-CM | POA: Diagnosis not present

## 2021-12-10 DIAGNOSIS — E1122 Type 2 diabetes mellitus with diabetic chronic kidney disease: Secondary | ICD-10-CM | POA: Diagnosis not present

## 2021-12-24 DIAGNOSIS — H60333 Swimmer's ear, bilateral: Secondary | ICD-10-CM | POA: Diagnosis not present

## 2022-01-03 ENCOUNTER — Ambulatory Visit (INDEPENDENT_AMBULATORY_CARE_PROVIDER_SITE_OTHER): Payer: Medicare Other

## 2022-01-03 DIAGNOSIS — I5042 Chronic combined systolic (congestive) and diastolic (congestive) heart failure: Secondary | ICD-10-CM

## 2022-01-04 ENCOUNTER — Ambulatory Visit (INDEPENDENT_AMBULATORY_CARE_PROVIDER_SITE_OTHER): Payer: Medicare Other

## 2022-01-04 DIAGNOSIS — Z95810 Presence of automatic (implantable) cardiac defibrillator: Secondary | ICD-10-CM | POA: Diagnosis not present

## 2022-01-04 DIAGNOSIS — I5042 Chronic combined systolic (congestive) and diastolic (congestive) heart failure: Secondary | ICD-10-CM

## 2022-01-04 LAB — CUP PACEART REMOTE DEVICE CHECK
Battery Remaining Longevity: 102 mo
Battery Voltage: 3.01 V
Brady Statistic RV Percent Paced: 0.01 %
Date Time Interrogation Session: 20230821031707
HighPow Impedance: 71 Ohm
Implantable Lead Implant Date: 20191030
Implantable Lead Location: 753860
Implantable Pulse Generator Implant Date: 20191030
Lead Channel Impedance Value: 342 Ohm
Lead Channel Impedance Value: 399 Ohm
Lead Channel Pacing Threshold Amplitude: 0.875 V
Lead Channel Pacing Threshold Pulse Width: 0.4 ms
Lead Channel Sensing Intrinsic Amplitude: 22.875 mV
Lead Channel Sensing Intrinsic Amplitude: 22.875 mV
Lead Channel Setting Pacing Amplitude: 2 V
Lead Channel Setting Pacing Pulse Width: 0.4 ms
Lead Channel Setting Sensing Sensitivity: 0.3 mV

## 2022-01-04 NOTE — Progress Notes (Signed)
EPIC Encounter for ICM Monitoring  Patient Name: Patricia Morgan is a 71 y.o. female Date: 01/04/2022 Primary Care Physican: Antony Contras, MD Primary Cardiologist: Croitoru Electrophysiologist: Croitoru Nephrologist: Dr Marny Lowenstein at [facility] (Sept 1st visit) 08/18/2021 Weight: 203 lbs 10/25/2021 Weight: 200 lbs 10/29/2021 Weight: 199 lbs 12/13/2021 Weight: 196 lbs     Attempted call to patient and unable to reach.  Left message to return call. Transmission reviewed.    Optivol Thoracic impedance trending close to normal but was suggesting intermittent days with possible fluid accumulation.   Prescribed:  Furosemide 40 mg -  take 1 tablet twice a day.  Per patient Dr Moshe Cipro has instructed her to take 1 extra tablet if needed for fluid symptoms.     Labs: 08/17/2020 Creatinine 2.63, BUN 39, Potassium 5.0, Sodium 141, GFR 19 A complete set of results can be found in Results Review.   Recommendations:  Unable to reach.     Follow-up plan: ICM clinic phone appointment on 02/07/2022.   91 day device clinic remote transmission 04/04/2022.     EP/Cardiology Office Visits:   Recall 09/01/2022 with Dr. Sallyanne Kuster.     Copy of ICM check sent to Dr. Sallyanne Kuster.    3 month ICM trend: 01/04/2022.    12-14 Month ICM trend:     Rosalene Billings, RN 01/04/2022 7:20 AM

## 2022-01-07 ENCOUNTER — Telehealth: Payer: Self-pay

## 2022-01-07 NOTE — Telephone Encounter (Signed)
Remote ICM transmission received.  Attempted call to patient regarding ICM remote transmission and left message per DPR to return call.   

## 2022-01-13 ENCOUNTER — Ambulatory Visit
Admission: RE | Admit: 2022-01-13 | Discharge: 2022-01-13 | Disposition: A | Discharge status: Home / Self Care | Payer: Medicare Other | Source: Ambulatory Visit | Attending: Family Medicine | Admitting: Family Medicine

## 2022-01-13 DIAGNOSIS — Z1231 Encounter for screening mammogram for malignant neoplasm of breast: Secondary | ICD-10-CM | POA: Diagnosis not present

## 2022-01-19 DIAGNOSIS — G4733 Obstructive sleep apnea (adult) (pediatric): Secondary | ICD-10-CM | POA: Diagnosis not present

## 2022-01-31 NOTE — Progress Notes (Signed)
Remote ICD transmission.   

## 2022-02-07 ENCOUNTER — Ambulatory Visit (INDEPENDENT_AMBULATORY_CARE_PROVIDER_SITE_OTHER): Payer: Medicare Other

## 2022-02-07 DIAGNOSIS — Z95810 Presence of automatic (implantable) cardiac defibrillator: Secondary | ICD-10-CM

## 2022-02-07 DIAGNOSIS — I5042 Chronic combined systolic (congestive) and diastolic (congestive) heart failure: Secondary | ICD-10-CM

## 2022-02-09 NOTE — Progress Notes (Unsigned)
EPIC Encounter for ICM Monitoring  Patient Name: Patricia Morgan is a 71 y.o. female Date: 02/09/2022 Primary Care Physican: Antony Contras, MD Primary Cardiologist: Croitoru Electrophysiologist: Croitoru Nephrologist: Dr Marny Lowenstein at [facility] (Sept 29th 1st visit) 10/25/2021 Weight: 200 lbs 10/29/2021 Weight: 199 lbs 12/13/2021 Weight: 196 lbs 02/09/2022 Weight: 204 lbs     Spoke with patient and heart failure questions reviewed.  Transmission results reviewed.  Pt reports SOB and weight increase by 3-4 lbs within last week.     Optivol Thoracic impedance suggesting possible fluid accumulation starting 9/20.   Prescribed:  Furosemide 40 mg -  take 1 tablet twice a day.     Labs: 11/11/2021 Creatinine 2.57, BUN 41, Potassium 4.5, Sodium 140, GFR 19  Care Everywhere A complete set of results can be found in Results Review.   Recommendations:  She self adjusts Furosemide.  She took extra 20 mg Furosemide on 9/26 and plans on taking extra 20 mg this afternoon.  She has appt on 9/29 with new Nephrologist.    Follow-up plan: ICM clinic phone appointment on 02/15/2022 to recheck fluid levels.   91 day device clinic remote transmission 04/04/2022.     EP/Cardiology Office Visits:   Recall 09/01/2022 with Dr. Sallyanne Kuster.     Copy of ICM check sent to Dr. Sallyanne Kuster.   3 month ICM trend: 02/07/2022.  ***  12-14 Month ICM trend:   ***  Patricia Billings, RN 02/09/2022 10:23 AM

## 2022-02-10 NOTE — Progress Notes (Signed)
Attempted call to patient and unable to reach.  Left message to return call. Transmission reviewed.    02/10/2022 Updated Optivol impedance suggesting fluid levels returned to normal after taking extra Furosemide.

## 2022-02-11 DIAGNOSIS — D509 Iron deficiency anemia, unspecified: Secondary | ICD-10-CM | POA: Diagnosis not present

## 2022-02-11 DIAGNOSIS — I1310 Hypertensive heart and chronic kidney disease without heart failure, with stage 1 through stage 4 chronic kidney disease, or unspecified chronic kidney disease: Secondary | ICD-10-CM | POA: Diagnosis not present

## 2022-02-11 DIAGNOSIS — Z79899 Other long term (current) drug therapy: Secondary | ICD-10-CM | POA: Diagnosis not present

## 2022-02-11 DIAGNOSIS — E1122 Type 2 diabetes mellitus with diabetic chronic kidney disease: Secondary | ICD-10-CM | POA: Diagnosis not present

## 2022-02-11 DIAGNOSIS — I428 Other cardiomyopathies: Secondary | ICD-10-CM | POA: Diagnosis not present

## 2022-02-11 DIAGNOSIS — D631 Anemia in chronic kidney disease: Secondary | ICD-10-CM | POA: Diagnosis not present

## 2022-02-11 DIAGNOSIS — E785 Hyperlipidemia, unspecified: Secondary | ICD-10-CM | POA: Diagnosis not present

## 2022-02-11 DIAGNOSIS — Z9221 Personal history of antineoplastic chemotherapy: Secondary | ICD-10-CM | POA: Diagnosis not present

## 2022-02-11 DIAGNOSIS — N184 Chronic kidney disease, stage 4 (severe): Secondary | ICD-10-CM | POA: Diagnosis not present

## 2022-02-11 DIAGNOSIS — I5042 Chronic combined systolic (congestive) and diastolic (congestive) heart failure: Secondary | ICD-10-CM | POA: Diagnosis not present

## 2022-02-11 DIAGNOSIS — Z853 Personal history of malignant neoplasm of breast: Secondary | ICD-10-CM | POA: Diagnosis not present

## 2022-02-11 DIAGNOSIS — M109 Gout, unspecified: Secondary | ICD-10-CM | POA: Diagnosis not present

## 2022-02-11 DIAGNOSIS — Z1159 Encounter for screening for other viral diseases: Secondary | ICD-10-CM | POA: Diagnosis not present

## 2022-02-11 DIAGNOSIS — Z9013 Acquired absence of bilateral breasts and nipples: Secondary | ICD-10-CM | POA: Diagnosis not present

## 2022-02-22 DIAGNOSIS — N184 Chronic kidney disease, stage 4 (severe): Secondary | ICD-10-CM | POA: Diagnosis not present

## 2022-02-22 DIAGNOSIS — N281 Cyst of kidney, acquired: Secondary | ICD-10-CM | POA: Diagnosis not present

## 2022-02-28 DIAGNOSIS — H60333 Swimmer's ear, bilateral: Secondary | ICD-10-CM | POA: Diagnosis not present

## 2022-02-28 DIAGNOSIS — H6123 Impacted cerumen, bilateral: Secondary | ICD-10-CM | POA: Diagnosis not present

## 2022-03-14 ENCOUNTER — Ambulatory Visit (INDEPENDENT_AMBULATORY_CARE_PROVIDER_SITE_OTHER): Payer: Medicare Other

## 2022-03-14 DIAGNOSIS — I5042 Chronic combined systolic (congestive) and diastolic (congestive) heart failure: Secondary | ICD-10-CM

## 2022-03-14 DIAGNOSIS — Z95810 Presence of automatic (implantable) cardiac defibrillator: Secondary | ICD-10-CM | POA: Diagnosis not present

## 2022-03-16 ENCOUNTER — Telehealth: Payer: Self-pay

## 2022-03-16 NOTE — Telephone Encounter (Signed)
Remote ICM transmission received.  Attempted call to patient regarding ICM remote transmission and no answer or voice mail option.  

## 2022-03-16 NOTE — Progress Notes (Signed)
EPIC Encounter for ICM Monitoring  Patient Name: Patricia Morgan is a 71 y.o. female Date: 03/16/2022 Primary Care Physican: Antony Contras, MD Primary Cardiologist: Croitoru Electrophysiologist: Croitoru Nephrologist: Dr Marny Lowenstein at [facility] (Sept 29th 1st visit) 10/25/2021 Weight: 200 lbs 10/29/2021 Weight: 199 lbs 12/13/2021 Weight: 196 lbs 02/09/2022 Weight: 204 lbs     Attempted call to patient and unable to reach. Transmission reviewed.      Optivol Thoracic impedance suggesting normal fluid levels.   Prescribed:  Furosemide 40 mg -  take 2 tablets (80 mg total) by mouth daily per 02/22/2022 Nephrology note in Care Everywhere.     Labs: 02/22/2022 Creatinine 3.06, BUN 131, Potassium 4.8, Sodium 139, GFR 16  Care Everywhere 02/11/2022 Creatinine 2.78, BUN 109, Potassium 4.5, Sodium 140, GFR 18  Care Everywhere 11/11/2021 Creatinine 2.57, BUN 41,   Potassium 4.5, Sodium 140, GFR 19  Care Everywhere A complete set of results can be found in Results Review.   Recommendations:  Unable to reach.     Follow-up plan: ICM clinic phone appointment on 04/18/2022.   91 day device clinic remote transmission 04/04/2022.     EP/Cardiology Office Visits:   Recall 09/01/2022 with Dr. Sallyanne Kuster.     Copy of ICM check sent to Dr. Sallyanne Kuster.   3 month ICM trend: 03/14/2022.    12-14 Month ICM trend:     Rosalene Billings, RN 03/16/2022 5:56 AM

## 2022-03-22 DIAGNOSIS — N281 Cyst of kidney, acquired: Secondary | ICD-10-CM | POA: Diagnosis not present

## 2022-03-22 DIAGNOSIS — N2889 Other specified disorders of kidney and ureter: Secondary | ICD-10-CM | POA: Diagnosis not present

## 2022-03-22 DIAGNOSIS — K760 Fatty (change of) liver, not elsewhere classified: Secondary | ICD-10-CM | POA: Diagnosis not present

## 2022-03-22 DIAGNOSIS — D7389 Other diseases of spleen: Secondary | ICD-10-CM | POA: Diagnosis not present

## 2022-03-22 DIAGNOSIS — Q6102 Congenital multiple renal cysts: Secondary | ICD-10-CM | POA: Diagnosis not present

## 2022-03-22 DIAGNOSIS — J948 Other specified pleural conditions: Secondary | ICD-10-CM | POA: Diagnosis not present

## 2022-03-28 DIAGNOSIS — E7800 Pure hypercholesterolemia, unspecified: Secondary | ICD-10-CM | POA: Diagnosis not present

## 2022-03-28 DIAGNOSIS — N184 Chronic kidney disease, stage 4 (severe): Secondary | ICD-10-CM | POA: Diagnosis not present

## 2022-03-28 DIAGNOSIS — E1122 Type 2 diabetes mellitus with diabetic chronic kidney disease: Secondary | ICD-10-CM | POA: Diagnosis not present

## 2022-03-28 DIAGNOSIS — I10 Essential (primary) hypertension: Secondary | ICD-10-CM | POA: Diagnosis not present

## 2022-04-04 ENCOUNTER — Ambulatory Visit (INDEPENDENT_AMBULATORY_CARE_PROVIDER_SITE_OTHER): Payer: Medicare Other

## 2022-04-04 DIAGNOSIS — I428 Other cardiomyopathies: Secondary | ICD-10-CM | POA: Diagnosis not present

## 2022-04-05 LAB — CUP PACEART REMOTE DEVICE CHECK
Battery Remaining Longevity: 97 mo
Battery Voltage: 3.01 V
Brady Statistic RV Percent Paced: 0.01 %
Date Time Interrogation Session: 20231120043824
HighPow Impedance: 73 Ohm
Implantable Lead Connection Status: 753985
Implantable Lead Implant Date: 20191030
Implantable Lead Location: 753860
Implantable Pulse Generator Implant Date: 20191030
Lead Channel Impedance Value: 304 Ohm
Lead Channel Impedance Value: 361 Ohm
Lead Channel Pacing Threshold Amplitude: 0.75 V
Lead Channel Pacing Threshold Pulse Width: 0.4 ms
Lead Channel Sensing Intrinsic Amplitude: 21.125 mV
Lead Channel Sensing Intrinsic Amplitude: 21.125 mV
Lead Channel Setting Pacing Amplitude: 2 V
Lead Channel Setting Pacing Pulse Width: 0.4 ms
Lead Channel Setting Sensing Sensitivity: 0.3 mV
Zone Setting Status: 755011
Zone Setting Status: 755011

## 2022-04-13 DIAGNOSIS — N184 Chronic kidney disease, stage 4 (severe): Secondary | ICD-10-CM | POA: Diagnosis not present

## 2022-04-18 ENCOUNTER — Ambulatory Visit (INDEPENDENT_AMBULATORY_CARE_PROVIDER_SITE_OTHER): Payer: Medicare Other

## 2022-04-18 DIAGNOSIS — Z95810 Presence of automatic (implantable) cardiac defibrillator: Secondary | ICD-10-CM

## 2022-04-18 DIAGNOSIS — I5042 Chronic combined systolic (congestive) and diastolic (congestive) heart failure: Secondary | ICD-10-CM

## 2022-04-18 NOTE — Progress Notes (Signed)
EPIC Encounter for ICM Monitoring  Patient Name: Patricia Morgan is a 71 y.o. female Date: 04/18/2022 Primary Care Physican: Antony Contras, MD Primary Cardiologist: Croitoru Electrophysiologist: Croitoru Nephrologist: Dr Marny Lowenstein at [facility] (Sept 29th 1st visit) 10/25/2021 Weight: 200 lbs 10/29/2021 Weight: 199 lbs 12/13/2021 Weight: 196 lbs 02/09/2022 Weight: 204 lbs     Spoke with patient and heart failure questions reviewed.  Transmission results reviewed.  Pt asymptomatic for fluid accumulation.  She reports falling outside on Saturday and took a really hard fall but no injuries.       Optivol Thoracic impedance suggesting possible fluid accumulation starting 11/30.   Prescribed:  Furosemide 40 mg -  take 2 tablets (80 mg total) by mouth daily per 02/22/2022 Nephrology note in Care Everywhere.     Labs: 02/22/2022 Creatinine 3.06, BUN 131, Potassium 4.8, Sodium 139, GFR 16  Care Everywhere 02/11/2022 Creatinine 2.78, BUN 109, Potassium 4.5, Sodium 140, GFR 18  Care Everywhere 11/11/2021 Creatinine 2.57, BUN 41,   Potassium 4.5, Sodium 140, GFR 19  Care Everywhere A complete set of results can be found in Results Review.   Recommendations:  Advised to limit salt intake to 2000 mg daily and fluid intake to 64 oz daily.  Encouraged to call if experiencing any fluid symptoms.    Follow-up plan: ICM clinic phone appointment on 04/25/2022 to recheck fluid levels.   91 day device clinic remote transmission 07/04/2022.     EP/Cardiology Office Visits:   Recall 09/01/2022 with Dr. Sallyanne Kuster.     Copy of ICM check sent to Dr. Sallyanne Kuster.   3 month ICM trend: 04/18/2022.    12-14 Month ICM trend:     Rosalene Billings, RN 04/18/2022 10:20 AM

## 2022-04-19 DIAGNOSIS — G4733 Obstructive sleep apnea (adult) (pediatric): Secondary | ICD-10-CM | POA: Diagnosis not present

## 2022-04-25 ENCOUNTER — Ambulatory Visit (INDEPENDENT_AMBULATORY_CARE_PROVIDER_SITE_OTHER): Payer: Medicare Other

## 2022-04-25 DIAGNOSIS — I5042 Chronic combined systolic (congestive) and diastolic (congestive) heart failure: Secondary | ICD-10-CM

## 2022-04-25 DIAGNOSIS — Z95810 Presence of automatic (implantable) cardiac defibrillator: Secondary | ICD-10-CM

## 2022-04-27 ENCOUNTER — Telehealth: Payer: Self-pay

## 2022-04-27 NOTE — Progress Notes (Signed)
EPIC Encounter for ICM Monitoring  Patient Name: Patricia Morgan is a 71 y.o. female Date: 04/27/2022 Primary Care Physican: Antony Contras, MD Primary Cardiologist: Croitoru Electrophysiologist: Croitoru Nephrologist: Dr Marny Lowenstein at [facility] (Sept 29th 1st visit) 10/25/2021 Weight: 200 lbs 10/29/2021 Weight: 199 lbs 12/13/2021 Weight: 196 lbs 02/09/2022 Weight: 204 lbs     Attempted call to patient and unable to reach.  Left message to return call. Transmission reviewed.     Optivol Thoracic impedance suggesting possible fluid accumulation starting 11/30 and returning to baseline normal 12/5.   Prescribed:  Furosemide 40 mg -  take 2 tablets (80 mg total) by mouth daily per 02/22/2022 Nephrology note in Care Everywhere.     Labs: 02/22/2022 Creatinine 3.06, BUN 131, Potassium 4.8, Sodium 139, GFR 16  Care Everywhere 02/11/2022 Creatinine 2.78, BUN 109, Potassium 4.5, Sodium 140, GFR 18  Care Everywhere 11/11/2021 Creatinine 2.57, BUN 41,   Potassium 4.5, Sodium 140, GFR 19  Care Everywhere A complete set of results can be found in Results Review.   Recommendations: Unable to reach.     Follow-up plan: ICM clinic phone appointment on 06/06/2022.   91 day device clinic remote transmission 07/04/2022.     EP/Cardiology Office Visits:   Recall 09/01/2022 with Dr. Sallyanne Kuster.     Copy of ICM check sent to Dr. Sallyanne Kuster.    3 month ICM trend: 04/25/2022.    12-14 Month ICM trend:     Rosalene Billings, RN 04/27/2022 7:31 AM

## 2022-04-27 NOTE — Telephone Encounter (Signed)
Remote ICM transmission received.  Attempted call to patient regarding ICM remote transmission and left message to return call   

## 2022-05-05 DIAGNOSIS — Z853 Personal history of malignant neoplasm of breast: Secondary | ICD-10-CM | POA: Diagnosis not present

## 2022-05-05 DIAGNOSIS — N281 Cyst of kidney, acquired: Secondary | ICD-10-CM | POA: Diagnosis not present

## 2022-05-13 ENCOUNTER — Other Ambulatory Visit: Payer: Medicare Other

## 2022-05-13 ENCOUNTER — Ambulatory Visit
Admission: RE | Admit: 2022-05-13 | Discharge: 2022-05-13 | Disposition: A | Discharge status: Home / Self Care | Payer: Medicare Other | Source: Ambulatory Visit | Attending: Family Medicine | Admitting: Family Medicine

## 2022-05-13 DIAGNOSIS — M810 Age-related osteoporosis without current pathological fracture: Secondary | ICD-10-CM | POA: Diagnosis not present

## 2022-05-13 DIAGNOSIS — Z780 Asymptomatic menopausal state: Secondary | ICD-10-CM | POA: Diagnosis not present

## 2022-05-19 DIAGNOSIS — N184 Chronic kidney disease, stage 4 (severe): Secondary | ICD-10-CM | POA: Diagnosis not present

## 2022-05-19 DIAGNOSIS — I272 Pulmonary hypertension, unspecified: Secondary | ICD-10-CM | POA: Diagnosis not present

## 2022-05-19 DIAGNOSIS — M109 Gout, unspecified: Secondary | ICD-10-CM | POA: Diagnosis not present

## 2022-05-19 DIAGNOSIS — Z853 Personal history of malignant neoplasm of breast: Secondary | ICD-10-CM | POA: Diagnosis not present

## 2022-05-19 DIAGNOSIS — I129 Hypertensive chronic kidney disease with stage 1 through stage 4 chronic kidney disease, or unspecified chronic kidney disease: Secondary | ICD-10-CM | POA: Diagnosis not present

## 2022-05-19 DIAGNOSIS — Z23 Encounter for immunization: Secondary | ICD-10-CM | POA: Diagnosis not present

## 2022-05-19 DIAGNOSIS — E559 Vitamin D deficiency, unspecified: Secondary | ICD-10-CM | POA: Diagnosis not present

## 2022-05-19 DIAGNOSIS — Z0000 Encounter for general adult medical examination without abnormal findings: Secondary | ICD-10-CM | POA: Diagnosis not present

## 2022-05-19 DIAGNOSIS — D72819 Decreased white blood cell count, unspecified: Secondary | ICD-10-CM | POA: Diagnosis not present

## 2022-05-19 DIAGNOSIS — E7800 Pure hypercholesterolemia, unspecified: Secondary | ICD-10-CM | POA: Diagnosis not present

## 2022-05-19 DIAGNOSIS — E1122 Type 2 diabetes mellitus with diabetic chronic kidney disease: Secondary | ICD-10-CM | POA: Diagnosis not present

## 2022-05-19 NOTE — Progress Notes (Signed)
Remote ICD transmission.   

## 2022-05-20 DIAGNOSIS — G4733 Obstructive sleep apnea (adult) (pediatric): Secondary | ICD-10-CM | POA: Diagnosis not present

## 2022-05-23 DIAGNOSIS — N184 Chronic kidney disease, stage 4 (severe): Secondary | ICD-10-CM | POA: Diagnosis not present

## 2022-05-26 ENCOUNTER — Other Ambulatory Visit: Payer: Self-pay | Admitting: Cardiovascular Disease

## 2022-06-06 ENCOUNTER — Ambulatory Visit: Payer: 59 | Attending: Cardiovascular Disease

## 2022-06-06 DIAGNOSIS — I5042 Chronic combined systolic (congestive) and diastolic (congestive) heart failure: Secondary | ICD-10-CM | POA: Diagnosis not present

## 2022-06-06 DIAGNOSIS — Z95810 Presence of automatic (implantable) cardiac defibrillator: Secondary | ICD-10-CM | POA: Diagnosis not present

## 2022-06-10 NOTE — Progress Notes (Signed)
EPIC Encounter for ICM Monitoring  Patient Name: Patricia Morgan is a 72 y.o. female Date: 06/10/2022 Primary Care Physican: Antony Contras, MD Primary Cardiologist: Croitoru Electrophysiologist: Croitoru Nephrologist: Dr Marny Lowenstein at [facility] (Sept 29th 1st visit) 10/25/2021 Weight: 200 lbs 10/29/2021 Weight: 199 lbs 12/13/2021 Weight: 196 lbs 02/09/2022 Weight: 204 lbs 06/10/2022 Weight  192 lbs     Spoke with patient and heart failure questions reviewed.  Transmission results reviewed.  Pt asymptomatic for fluid accumulation.  Reports feeling well at this time and voices no complaints.      Optivol Thoracic impedance suggesting normal fluids.   Prescribed:  Furosemide 40 mg -  take 2 tablets (80 mg total) by mouth daily per 02/22/2022 Nephrology note in Care Everywhere.     Labs: 02/22/2022 Creatinine 3.06, BUN 131, Potassium 4.8, Sodium 139, GFR 16  Care Everywhere 02/11/2022 Creatinine 2.78, BUN 109, Potassium 4.5, Sodium 140, GFR 18  Care Everywhere 11/11/2021 Creatinine 2.57, BUN 41,   Potassium 4.5, Sodium 140, GFR 19  Care Everywhere A complete set of results can be found in Results Review.   Recommendations:  No changes and encouraged to call if experiencing any fluid symptoms.   Follow-up plan: ICM clinic phone appointment on 07/11/2022.   91 day device clinic remote transmission 07/04/2022.     EP/Cardiology Office Visits:   Recall 09/01/2022 with Dr. Sallyanne Kuster.     Copy of ICM check sent to Dr. Sallyanne Kuster.    3 month ICM trend: 06/06/2022.    12-14 Month ICM trend:     Rosalene Billings, RN 06/10/2022 2:44 PM

## 2022-06-13 DIAGNOSIS — H6123 Impacted cerumen, bilateral: Secondary | ICD-10-CM | POA: Diagnosis not present

## 2022-06-26 ENCOUNTER — Other Ambulatory Visit: Payer: Self-pay | Admitting: Cardiovascular Disease

## 2022-07-04 ENCOUNTER — Ambulatory Visit (INDEPENDENT_AMBULATORY_CARE_PROVIDER_SITE_OTHER): Payer: 59

## 2022-07-04 DIAGNOSIS — I428 Other cardiomyopathies: Secondary | ICD-10-CM

## 2022-07-04 LAB — CUP PACEART REMOTE DEVICE CHECK
Battery Remaining Longevity: 97 mo
Battery Voltage: 3 V
Brady Statistic RV Percent Paced: 0.01 %
Date Time Interrogation Session: 20240219033423
HighPow Impedance: 78 Ohm
Implantable Lead Connection Status: 753985
Implantable Lead Implant Date: 20191030
Implantable Lead Location: 753860
Implantable Pulse Generator Implant Date: 20191030
Lead Channel Impedance Value: 361 Ohm
Lead Channel Impedance Value: 361 Ohm
Lead Channel Pacing Threshold Amplitude: 0.75 V
Lead Channel Pacing Threshold Pulse Width: 0.4 ms
Lead Channel Sensing Intrinsic Amplitude: 27.625 mV
Lead Channel Sensing Intrinsic Amplitude: 27.625 mV
Lead Channel Setting Pacing Amplitude: 2 V
Lead Channel Setting Pacing Pulse Width: 0.4 ms
Lead Channel Setting Sensing Sensitivity: 0.3 mV
Zone Setting Status: 755011
Zone Setting Status: 755011

## 2022-07-11 ENCOUNTER — Ambulatory Visit: Payer: 59 | Attending: Cardiovascular Disease

## 2022-07-11 DIAGNOSIS — I5042 Chronic combined systolic (congestive) and diastolic (congestive) heart failure: Secondary | ICD-10-CM

## 2022-07-11 DIAGNOSIS — Z95810 Presence of automatic (implantable) cardiac defibrillator: Secondary | ICD-10-CM | POA: Diagnosis not present

## 2022-07-15 NOTE — Progress Notes (Signed)
EPIC Encounter for ICM Monitoring  Patient Name: Patricia Morgan is a 72 y.o. female Date: 07/15/2022 Primary Care Physican: Antony Contras, MD Primary Cardiologist: Croitoru Electrophysiologist: Croitoru Nephrologist: Dr Marny Lowenstein at [facility] (Sept 29th 1st visit) 10/25/2021 Weight: 200 lbs 10/29/2021 Weight: 199 lbs 12/13/2021 Weight: 196 lbs 02/09/2022 Weight: 204 lbs 06/10/2022 Weight  192 lbs 07/15/2022 Weight: 185 lbs     Spoke with patient and heart failure questions reviewed.  Transmission results reviewed.  Pt asymptomatic for fluid accumulation.  Reports feeling well at this time and voices no complaints.   She continues to diet and lose weight.    Optivol Thoracic impedance suggesting normal fluids.   Prescribed:  Furosemide 40 mg -  take 2 tablets (80 mg total) by mouth daily per 02/22/2022 Nephrology note in Care Everywhere.     Labs: 02/22/2022 Creatinine 3.06, BUN 131, Potassium 4.8, Sodium 139, GFR 16  Care Everywhere 02/11/2022 Creatinine 2.78, BUN 109, Potassium 4.5, Sodium 140, GFR 18  Care Everywhere 11/11/2021 Creatinine 2.57, BUN 41,   Potassium 4.5, Sodium 140, GFR 19  Care Everywhere A complete set of results can be found in Results Review.   Recommendations:  No changes and encouraged to call if experiencing any fluid symptoms.   Follow-up plan: ICM clinic phone appointment on 07/11/2022.   91 day device clinic remote transmission 10/03/2022.     EP/Cardiology Office Visits:   09/01/2022 with Dr. Sallyanne Kuster (needs updated DPR form).     Copy of ICM check sent to Dr. Sallyanne Kuster.   3 month ICM trend: 07/11/2022.    12-14 Month ICM trend:     Rosalene Billings, RN 07/15/2022 12:55 PM

## 2022-07-26 ENCOUNTER — Ambulatory Visit (INDEPENDENT_AMBULATORY_CARE_PROVIDER_SITE_OTHER): Payer: 59 | Admitting: Internal Medicine

## 2022-07-26 ENCOUNTER — Encounter: Payer: Self-pay | Admitting: Internal Medicine

## 2022-07-26 VITALS — BP 110/62 | HR 70 | Ht 61.0 in | Wt 202.0 lb

## 2022-07-26 DIAGNOSIS — Z86010 Personal history of colonic polyps: Secondary | ICD-10-CM | POA: Diagnosis not present

## 2022-07-26 NOTE — Patient Instructions (Signed)
_______________________________________________________  If your blood pressure at your visit was 140/90 or greater, please contact your primary care physician to follow up on this.  _______________________________________________________  If you are age 74 or older, your body mass index should be between 23-30. Your Body mass index is 38.17 kg/m. If this is out of the aforementioned range listed, please consider follow up with your Primary Care Provider.  If you are age 66 or younger, your body mass index should be between 19-25. Your Body mass index is 38.17 kg/m. If this is out of the aformentioned range listed, please consider follow up with your Primary Care Provider.   ________________________________________________________  The [facility] providers would like to encourage you to use [facility] to communicate with providers for non-urgent requests or questions.  Due to long hold times on the telephone, sending your provider a message by [facility] may be a faster and more efficient way to get a response.  Please allow 48 business hours for a response.  Please remember that this is for non-urgent requests.  _______________________________________________________  Follow up as needed

## 2022-07-26 NOTE — Progress Notes (Signed)
HISTORY OF PRESENT ILLNESS:  Patricia Morgan is a 72 y.o. female with multiple significant medical problems.  She presents today at the encouragement of her primary care provider, Dr. [name], to discuss the need for possible surveillance colonoscopy.  The patient underwent colonoscopy in 2012 and again in 2018.  She was found to have nonadvanced adenomatous.  As well, sigmoid diverticulosis and internal hemorrhoids.  At the time of her last visit, follow-up in 5 years recommended.  Patient has had significant interval medical problems.  She has congestive heart failure.  Now has fibrillator.  Remains on chronic anticoagulation.  Poor ejection fraction.  Multiple medications.  Obese with sleep apnea.  Chronic renal insufficiency.  GI review of systems is negative.  She denies constipation.  No change in bowel habits.  No melena or hematochezia.  No family history of colon cancer.  Hemoglobin from September 2023 was 10.8 with MCV of 96 4.  REVIEW OF SYSTEMS:  All non-GI ROS negative unless otherwise stated in the HPI except for  Past Medical History:  Diagnosis Date   ANEMIA-NOS    BREAST CANCER, HX OF 1994 & 1995   1994>>recurrence in 1995, R breast, s/p lumpectomy>>mastectomy   Cancer ([place])    Cardiomyopathy, nonischemic (HCC)    CHF (congestive heart failure) (HCC)    chronic systolic and diastolic CHF   CHRONIC KIDNEY DISEASE STAGE III (MODERATE)    Clotting disorder (HCC)    on eliquis for Factor 5 disorder   Colon polyps    DIABETES MELLITUS, TYPE II    Diverticulosis    HYPERLIPIDEMIA    HYPERTENSION    ILD (interstitial lung disease) ([place])    10/2018 CT findings suggestive of sarcoidosis; followed by Dr. Elsworth Soho   OBSTRUCTIVE SLEEP APNEA    CPAP   Personal history of chemotherapy    Proteinuria    PULMONARY EMBOLISM 09/24/2008   anticoag thru 03/2010   Rotator cuff tear    Left   Shoulder impingement, left    Sleep apnea    Wears CPAP nightly    Past Surgical History:   Procedure Laterality Date   BREAST SURGERY     CARDIAC CATHETERIZATION N/A 11/02/2015   Procedure: Right/Left Heart Cath and Coronary Angiography;  Surgeon: Burnell Blanks, MD;  Location: [place] [facility];  Service: Cardiovascular;  Laterality: N/A;   DILATION AND CURETTAGE OF UTERUS     ICD IMPLANT N/A 03/14/2018   Procedure: ICD IMPLANT;  Surgeon: Sanda Klein, MD;  Location: [place] [facility];  Service: Cardiovascular;  Laterality: N/A;   insertion port a cath     MASTECTOMY Right 1995   Right   PORT-A-CATH REMOVAL     SHOULDER ARTHROSCOPY WITH ROTATOR CUFF REPAIR AND SUBACROMIAL DECOMPRESSION Left 01/24/2019   Procedure: SHOULDER ARTHROSCOPY WITH ROTATOR CUFF REPAIR AND SUBACROMIAL DECOMPRESSION;  Surgeon: Nicholes Stairs, MD;  Location: [facility];  Service: Orthopedics;  Laterality: Left;  2.5 hrs    Social History Shawntelle Kadow  reports that she has never smoked. She has never used smokeless tobacco. She reports current alcohol use. She reports that she does not use drugs.  family history includes Asthma in her father; Diabetes in her sister; Parkinson's disease in her mother.  No Known Allergies     PHYSICAL EXAMINATION: Vital signs: BP 110/62   Pulse 70   Ht '5\' 1"'$  (1.549 m)   Wt 202 lb (91.6 kg)   SpO2 97%   BMI 38.17 kg/m   Constitutional:  Pleasant, overweight, no acute distress Psychiatric: alert and oriented x3, cooperative Eyes: extraocular movements intact, anicteric, conjunctiva pink Mouth: oral pharynx moist, no lesions Neck: supple no lymphadenopathy Cardiovascular: heart regular rate and rhythm, no murmur Lungs: clear to auscultation bilaterally Abdomen: soft, obese, nontender, nondistended, no obvious ascites, no peritoneal signs, normal bowel sounds, no organomegaly Rectal: Omitted Extremities: no clubbing or cyanosis.  Trace lower extremity edema bilaterally Skin: no lesions on visible extremities Neuro: No focal deficits.  Cranial  nerves intact  ASSESSMENT:  1.  Personal history of nonadvanced adenomatous polyps.  Previous colonoscopic examinations on quality. 2.  Multiple significant comorbidities   PLAN:  1.  We discussed the pros and cons of surveillance colonoscopy.  We also discussed colonoscopy in the context of the most recent guidelines.  Given her overall health issues, previous colonoscopy results, and current age, I would not favor surveillance colonoscopy.  We discussed the pros and cons of foregoing surveillance.  She is delighted with this recommendation.  She does contact the office should she have clinical issues that would require reassessment.  She will return to the care of Dr. [name]

## 2022-08-04 ENCOUNTER — Other Ambulatory Visit: Payer: Self-pay | Admitting: Cardiovascular Disease

## 2022-08-10 ENCOUNTER — Ambulatory Visit
Admission: RE | Admit: 2022-08-10 | Discharge: 2022-08-10 | Disposition: A | Discharge status: Home / Self Care | Payer: 59 | Source: Ambulatory Visit | Attending: Nurse Practitioner | Admitting: Nurse Practitioner

## 2022-08-10 VITALS — BP 121/62 | HR 77 | Temp 98.4°F | Resp 15

## 2022-08-10 DIAGNOSIS — R300 Dysuria: Secondary | ICD-10-CM | POA: Insufficient documentation

## 2022-08-10 DIAGNOSIS — N3000 Acute cystitis without hematuria: Secondary | ICD-10-CM | POA: Diagnosis not present

## 2022-08-10 LAB — POCT URINALYSIS DIP (MANUAL ENTRY)
Bilirubin, UA: NEGATIVE
Blood, UA: NEGATIVE
Glucose, UA: NEGATIVE mg/dL
Ketones, POC UA: NEGATIVE mg/dL
Nitrite, UA: NEGATIVE
Protein Ur, POC: 30 mg/dL — AB
Spec Grav, UA: 1.02 (ref 1.010–1.025)
Urobilinogen, UA: 0.2 E.U./dL
pH, UA: 7 (ref 5.0–8.0)

## 2022-08-10 MED ORDER — DOXYCYCLINE HYCLATE 100 MG PO CAPS: 100.0000 mg | ORAL_CAPSULE | Freq: Two times a day (BID) | ORAL | 0 refills | Status: AC

## 2022-08-10 NOTE — Discharge Instructions (Signed)
Doxycycline twice daily for 10 days The clinic will contact you with results of urine culture if it is positive Rest and fluids Follow-up with your PCP if your symptoms do not improve Please go to the emergency room if you have any worsening symptoms

## 2022-08-10 NOTE — ED Provider Notes (Signed)
[facility]    CSN: [id number] Arrival date & time: 08/10/22  [id number]      History   Chief Complaint No chief complaint on file.   HPI Patricia Morgan is a 72 y.o. female presents for evaluation of dysuria.  Patient reports 2 days of urinary urgency and frequency with incontinence.  Denies burning with urination, hematuria, fevers, nausea/vomiting, flank pain.  No vaginal discharge or STD concern.  She does have a history of chronic kidney disease with a GFR of 17.  She is not on hemodialysis.  She is been increasing fluids and attempt to treat her symptoms.  She has no other concerns at this time.  HPI  Past Medical History:  Diagnosis Date   ANEMIA-NOS    BREAST CANCER, HX OF 1994 & 1995   1994>>recurrence in 1995, R breast, s/p lumpectomy>>mastectomy   Cancer ([place])    Cardiomyopathy, nonischemic ([place])    CHF (congestive heart failure) (HCC)    chronic systolic and diastolic CHF   CHRONIC KIDNEY DISEASE STAGE III (MODERATE)    Clotting disorder ([place])    on eliquis for Factor 5 disorder   Colon polyps    DIABETES MELLITUS, TYPE II    Diverticulosis    HYPERLIPIDEMIA    HYPERTENSION    ILD (interstitial lung disease) ([place])    10/2018 CT findings suggestive of sarcoidosis; followed by Dr. Elsworth Soho   OBSTRUCTIVE SLEEP APNEA    CPAP   Personal history of chemotherapy    Proteinuria    PULMONARY EMBOLISM 09/24/2008   anticoag thru 03/2010   Rotator cuff tear    Left   Shoulder impingement, left    Sleep apnea    Wears CPAP nightly    Patient Active Problem List   Diagnosis Date Noted   Complete rotator cuff tear of left shoulder 01/24/2019   Sarcoidosis 11/12/2018   ILD (interstitial lung disease) ([place]) 03/30/2018   At risk for sudden cardiac death 28-Mar-2018   ICD (implantable cardioverter-defibrillator) in place Mar 28, 2018   Long QT interval 03/08/2018   Long term current use of anticoagulant 03/08/2018   Diabetes mellitus type 2 in obese ([place]) 03/08/2018    Factor V Leiden ([place]) 01/12/2016   Chronic combined systolic and diastolic CHF (congestive heart failure) ([place]) 11/03/2015   Hypertensive heart disease 11/03/2015   Non-ischemic cardiomyopathy (HCC)    Abnormal nuclear stress test 11/01/2015   Chronic renal failure 11/01/2015   Unstable angina ([place]) 11/01/2015   Encounter for therapeutic drug monitoring 09/04/2015   Popliteal DVT (deep venous thrombosis) ([place]) 08/31/2015   Left ventricular systolic dysfunction, undermined duration 08/31/2015   SOB (shortness of breath) 08/30/2015   Severe obesity (BMI 35.0-35.9 with comorbidity) ([place]) 08/30/2015   Leg pain, left 08/30/2015   DVT (deep venous thrombosis) ([place]) 09/23/2014   Pulmonary embolism (HCC)    Otitis, externa 09/17/2013   Right knee pain 09/17/2013   Low back pain 01/08/2013   Unspecified constipation 07/16/2012   Preventative health care 05/30/2012   CKD (chronic kidney disease), stage IV ([place]) 06/26/2009   PROTEINURIA 06/26/2009   OSA (obstructive sleep apnea) 10/14/2008   Diabetes mellitus ([place]) 09/29/2008   Benign essential HTN 09/26/2008   ANEMIA-NOS 09/26/2008   BREAST CANCER, HX OF 09/26/2008    Past Surgical History:  Procedure Laterality Date   BREAST SURGERY     CARDIAC CATHETERIZATION N/A 11/02/2015   Procedure: Right/Left Heart Cath and Coronary Angiography;  Surgeon: Burnell Blanks, MD;  Location: [facility];  Service: Cardiovascular;  Laterality: N/A;   DILATION AND CURETTAGE OF UTERUS     ICD IMPLANT N/A 03/14/2018   Procedure: ICD IMPLANT;  Surgeon: Sanda Klein, MD;  Location: [place] [facility];  Service: Cardiovascular;  Laterality: N/A;   insertion port a cath     MASTECTOMY Right 1995   Right   PORT-A-CATH REMOVAL     SHOULDER ARTHROSCOPY WITH ROTATOR CUFF REPAIR AND SUBACROMIAL DECOMPRESSION Left 01/24/2019   Procedure: SHOULDER ARTHROSCOPY WITH ROTATOR CUFF REPAIR AND SUBACROMIAL DECOMPRESSION;  Surgeon: Nicholes Stairs,  MD;  Location: [place];  Service: Orthopedics;  Laterality: Left;  2.5 hrs    OB History   No obstetric history on file.      Home Medications    Prior to Admission medications   Medication Sig Start Date End Date Taking? Authorizing Provider  doxycycline (VIBRAMYCIN) 100 MG capsule Take 1 capsule (100 mg total) by mouth 2 (two) times daily for 7 days. 08/10/22 08/17/22 Yes Melynda Ripple, NP  acetaminophen (TYLENOL) 500 MG tablet Take 500 mg by mouth every 6 (six) hours as needed for mild pain.    [provider]  allopurinol (ZYLOPRIM) 100 MG tablet Take 100 mg by mouth 2 (two) times daily.     [provider]  azithromycin (ZITHROMAX) 250 MG tablet Day 1: take 2 tablets. Day 2-5: Take 1 tablet daily. 09/21/21   Jaynee Eagles, PA-C  carvedilol (COREG) 25 MG tablet TAKE 1 TABLET BY MOUTH TWICE  DAILY 05/27/22   Croitoru, Mihai, MD  Cholecalciferol (VITAMIN D-3) 5000 units TABS Take 5,000 Units by mouth daily.     [provider]  colchicine 0.6 MG tablet Take 0.6 mg by mouth as needed. 07/13/14   [provider]  diphenhydrAMINE (BENADRYL) 25 MG tablet Take 25 mg by mouth daily.    [provider]  ELIQUIS 5 MG TABS tablet TAKE 1 TABLET TWICE DAILY 05/09/19   Volanda Napoleon, MD  ferrous sulfate 325 (65 FE) MG tablet Take 325 mg by mouth daily with breakfast.    [provider]  Fluocinolone Acetonide 0.01 % OIL Place 5 drops in ear(s) daily. 06/15/21   Lynden Oxford Scales, PA-C  furosemide (LASIX) 40 MG tablet Take 2 tablets (80 mg total) by mouth daily. 11/06/2019 Per pt the nephrologist increased Furosemide to 80 mg every AM and 40 mg every afternoon.  She may also take additional 40 mg in the afternoon for lower extremity swelling. 12/17/19   Croitoru, Mihai, MD  hydrALAZINE (APRESOLINE) 10 MG tablet TAKE 1 TABLET BY MOUTH 3 TIMES  DAILY 06/27/22   Croitoru, Mihai, MD  isosorbide mononitrate (IMDUR) 30 MG 24 hr tablet TAKE 1 TABLET BY MOUTH  DAILY 06/27/22   Croitoru, Mihai, MD  rosuvastatin (CRESTOR) 10 MG tablet TAKE 1 TABLET BY MOUTH DAILY 08/05/22   Croitoru, Mihai, MD  TURMERIC PO Take 1,000 mg by mouth daily.    [provider]  vitamin B-12 (CYANOCOBALAMIN) 1000 MCG tablet Take 1,000 mcg by mouth daily.    [provider]  vitamin E 400 UNIT capsule Take 400 Units by mouth daily.    [provider]  fluticasone (FLONASE) 50 MCG/ACT nasal spray Place 2 sprays into the nose daily. 12/16/10 08/12/19  Rowe Clack, MD    Family History Family History  Problem Relation Age of Onset   Parkinson's disease Mother    Asthma Father    Diabetes Sister  Colon cancer Neg Hx    Stomach cancer Neg Hx    Esophageal cancer Neg Hx    Rectal cancer Neg Hx    Liver cancer Neg Hx     Social History Social History   Tobacco Use   Smoking status: Never   Smokeless tobacco: Never   Tobacco comments:    Lives alone-divorced  Vaping Use   Vaping Use: Never used  Substance Use Topics   Alcohol use: Yes    Alcohol/week: 0.0 standard drinks of alcohol    Comment: occ   Drug use: No     Allergies   Patient has no known allergies.   Review of Systems Review of Systems  Genitourinary:  Positive for frequency and urgency.     Physical Exam Triage Vital Signs ED Triage Vitals  Enc Vitals Group     BP 08/10/22 0953 121/62     Pulse Rate 08/10/22 0953 77     Resp 08/10/22 0953 15     Temp 08/10/22 0953 98.4 F (36.9 C)     Temp Source 08/10/22 0953 Oral     SpO2 08/10/22 0953 90 %     Weight --      Height --      Head Circumference --      Peak Flow --      Pain Score 08/10/22 0951 8     Pain Loc --      Pain Edu? --      Excl. in [place]? --    No data found.  Updated Vital Signs BP 121/62 (BP Location: Left Arm)   Pulse 77   Temp 98.4 F (36.9 C) (Oral)   Resp 15   SpO2 90%   Visual Acuity Right Eye Distance:   Left Eye Distance:   Bilateral Distance:    Right Eye Near:    Left Eye Near:    Bilateral Near:     Physical Exam Vitals and nursing note reviewed.  Constitutional:      Appearance: Normal appearance.  HENT:     Head: Normocephalic and atraumatic.  Eyes:     Pupils: Pupils are equal, round, and reactive to light.  Cardiovascular:     Rate and Rhythm: Normal rate.  Pulmonary:     Effort: Pulmonary effort is normal.  Abdominal:     Tenderness: There is no right CVA tenderness or left CVA tenderness.  Skin:    General: Skin is warm and dry.  Neurological:     General: No focal deficit present.     Mental Status: She is alert and oriented to person, place, and time.  Psychiatric:        Mood and Affect: Mood normal.        Behavior: Behavior normal.      UC Treatments / Results  Labs (all labs ordered are listed, but only abnormal results are displayed) Labs Reviewed  POCT URINALYSIS DIP (MANUAL ENTRY) - Abnormal; Notable for the following components:      Result Value   Protein Ur, POC =30 (*)    Leukocytes, UA Small (1+) (*)    All other components within normal limits  URINE CULTURE    EKG   Radiology No results found.  Procedures Procedures (including critical care time)  Medications Ordered in UC Medications - No data to display  Initial Impression / Assessment and Plan / UC Course  I have reviewed the triage vital signs and the nursing notes.  Pertinent  labs & imaging results that were available during my care of the patient were reviewed by me and considered in my medical decision making (see chart for details).     Discussed symptoms and exam with patient.  UA positive for UTI, will culture Start doxycycline as no dosage adjustment necessary based on her kidney function Follow-up with her PCP if symptoms do not improve ER precautions reviewed and patient verbalized understanding Final Clinical Impressions(s) / UC Diagnoses   Final diagnoses:  Dysuria  Acute cystitis without hematuria     Discharge  Instructions      Doxycycline twice daily for 10 days The clinic will contact you with results of urine culture if it is positive Rest and fluids Follow-up with your PCP if your symptoms do not improve Please go to the emergency room if you have any worsening symptoms   ED Prescriptions     Medication Sig Dispense Auth. Provider   doxycycline (VIBRAMYCIN) 100 MG capsule Take 1 capsule (100 mg total) by mouth 2 (two) times daily for 7 days. 14 capsule Melynda Ripple, NP      PDMP not reviewed this encounter.   Melynda Ripple, NP 08/10/22 1011

## 2022-08-10 NOTE — ED Triage Notes (Signed)
Pt presents with c/o urinary issues since Monday. Pt says her bladder hurts when voiding. Pt c/o urinary incontinence.

## 2022-08-11 LAB — URINE CULTURE: Culture: NO GROWTH

## 2022-08-15 ENCOUNTER — Ambulatory Visit
Admission: EM | Admit: 2022-08-15 | Discharge: 2022-08-15 | Disposition: A | Discharge status: Home / Self Care | Payer: 59 | Attending: Internal Medicine | Admitting: Internal Medicine

## 2022-08-15 ENCOUNTER — Ambulatory Visit (INDEPENDENT_AMBULATORY_CARE_PROVIDER_SITE_OTHER): Payer: 59

## 2022-08-15 DIAGNOSIS — Z95810 Presence of automatic (implantable) cardiac defibrillator: Secondary | ICD-10-CM

## 2022-08-15 DIAGNOSIS — Z87898 Personal history of other specified conditions: Secondary | ICD-10-CM

## 2022-08-15 DIAGNOSIS — I5042 Chronic combined systolic (congestive) and diastolic (congestive) heart failure: Secondary | ICD-10-CM

## 2022-08-15 DIAGNOSIS — M545 Low back pain, unspecified: Secondary | ICD-10-CM

## 2022-08-15 LAB — POCT URINALYSIS DIP (MANUAL ENTRY)
Bilirubin, UA: NEGATIVE
Blood, UA: NEGATIVE
Glucose, UA: NEGATIVE mg/dL
Ketones, POC UA: NEGATIVE mg/dL
Leukocytes, UA: NEGATIVE
Nitrite, UA: NEGATIVE
Protein Ur, POC: NEGATIVE mg/dL
Spec Grav, UA: 1.015 (ref 1.010–1.025)
Urobilinogen, UA: 0.2 E.U./dL
pH, UA: 6 (ref 5.0–8.0)

## 2022-08-15 NOTE — ED Triage Notes (Signed)
Pt reports having bladder discomfort, lower back pain and frequent urination. The patient was taking prescribed abx but was told to stop because urine culture was negative.   Started: about a week ago   Home interventions: none

## 2022-08-15 NOTE — ED Provider Notes (Signed)
[facility]    CSN: [id number] Arrival date & time: 08/15/22  1030      History   Chief Complaint Chief Complaint  Patient presents with   Back Pain   Urinary Frequency    HPI Patricia Morgan is a 72 y.o. female.   Pleasant 72 year old female with a known history of CKD stage IV presents today primarily for follow-up.  She was seen last week for symptoms of a UTI, was sent home with p.o. doxycycline twice daily x 7 days.  Patient states she got a call several days after her appointment telling her to stop the antibiotic because her urine culture was negative.  Patient presented initially with urgency and frequency, including back pain.  Patient states that the doxycycline was working very well for her, and her urgency and frequency are now resolved.  Despite being told to stop the antibiotic, she continued them and has 2 tablets left.  She states the only symptom still present is the back pain, but feels this is improving as well.  Has used a heating pad for this.  She is here primarily to ensure that the UTI is gone, and was told she may need a "scan".  She denies a history of kidney stones, denies flank pain, denies fever.  Overall, patient states she is significantly improved.  She has a follow-up with her nephrologist within the next month.   Back Pain Urinary Frequency    Past Medical History:  Diagnosis Date   ANEMIA-NOS    BREAST CANCER, HX OF 1994 & 1995   1994>>recurrence in 1995, R breast, s/p lumpectomy>>mastectomy   Cancer    Cardiomyopathy, nonischemic    CHF (congestive heart failure)    chronic systolic and diastolic CHF   CHRONIC KIDNEY DISEASE STAGE III (MODERATE)    Clotting disorder    on eliquis for Factor 5 disorder   Colon polyps    DIABETES MELLITUS, TYPE II    Diverticulosis    HYPERLIPIDEMIA    HYPERTENSION    ILD (interstitial lung disease)    10/2018 CT findings suggestive of sarcoidosis; followed by Dr. Elsworth Soho   OBSTRUCTIVE SLEEP APNEA     CPAP   Personal history of chemotherapy    Proteinuria    PULMONARY EMBOLISM 09/24/2008   anticoag thru 03/2010   Rotator cuff tear    Left   Shoulder impingement, left    Sleep apnea    Wears CPAP nightly    Patient Active Problem List   Diagnosis Date Noted   Complete rotator cuff tear of left shoulder 01/24/2019   Sarcoidosis 11/12/2018   ILD (interstitial lung disease) 03/30/2018   At risk for sudden cardiac death 03/15/2018   ICD (implantable cardioverter-defibrillator) in place Mar 15, 2018   Long QT interval 03/08/2018   Long term current use of anticoagulant 03/08/2018   Diabetes mellitus type 2 in obese 03/08/2018   Factor V Leiden 01/12/2016   Chronic combined systolic and diastolic CHF (congestive heart failure) 11/03/2015   Hypertensive heart disease 11/03/2015   Non-ischemic cardiomyopathy    Abnormal nuclear stress test 11/01/2015   Chronic renal failure 11/01/2015   Unstable angina 11/01/2015   Encounter for therapeutic drug monitoring 09/04/2015   Popliteal DVT (deep venous thrombosis) 08/31/2015   Left ventricular systolic dysfunction, undermined duration 08/31/2015   SOB (shortness of breath) 08/30/2015   Severe obesity (BMI 35.0-35.9 with comorbidity) 08/30/2015   Leg pain, left 08/30/2015   DVT (deep venous thrombosis) 09/23/2014   Pulmonary  embolism ([place])    Otitis, externa 09/17/2013   Right knee pain 09/17/2013   Low back pain 01/08/2013   Unspecified constipation 07/16/2012   Preventative health care 05/30/2012   CKD (chronic kidney disease), stage IV ([place]) 06/26/2009   PROTEINURIA 06/26/2009   OSA (obstructive sleep apnea) 10/14/2008   Diabetes mellitus ([place]) 09/29/2008   Benign essential HTN 09/26/2008   ANEMIA-NOS 09/26/2008   BREAST CANCER, HX OF 09/26/2008    Past Surgical History:  Procedure Laterality Date   BREAST SURGERY     CARDIAC CATHETERIZATION N/A 11/02/2015   Procedure: Right/Left Heart Cath and Coronary Angiography;   Surgeon: Burnell Blanks, MD;  Location: [facility];  Service: Cardiovascular;  Laterality: N/A;   DILATION AND CURETTAGE OF UTERUS     ICD IMPLANT N/A 03/14/2018   Procedure: ICD IMPLANT;  Surgeon: Sanda Klein, MD;  Location: [facility];  Service: Cardiovascular;  Laterality: N/A;   insertion port a cath     MASTECTOMY Right 1995   Right   PORT-A-CATH REMOVAL     SHOULDER ARTHROSCOPY WITH ROTATOR CUFF REPAIR AND SUBACROMIAL DECOMPRESSION Left 01/24/2019   Procedure: SHOULDER ARTHROSCOPY WITH ROTATOR CUFF REPAIR AND SUBACROMIAL DECOMPRESSION;  Surgeon: Nicholes Stairs, MD;  Location: [facility];  Service: Orthopedics;  Laterality: Left;  2.5 hrs    OB History   No obstetric history on file.      Home Medications    Prior to Admission medications   Medication Sig Start Date End Date Taking? Authorizing Provider  acetaminophen (TYLENOL) 500 MG tablet Take 500 mg by mouth every 6 (six) hours as needed for mild pain.    [provider]  allopurinol (ZYLOPRIM) 100 MG tablet Take 100 mg by mouth 2 (two) times daily.     [provider]  carvedilol (COREG) 25 MG tablet TAKE 1 TABLET BY MOUTH TWICE  DAILY 05/27/22   Croitoru, Mihai, MD  Cholecalciferol (VITAMIN D-3) 5000 units TABS Take 5,000 Units by mouth daily.     [provider]  colchicine 0.6 MG tablet Take 0.6 mg by mouth as needed. 07/13/14   [provider]  diphenhydrAMINE (BENADRYL) 25 MG tablet Take 25 mg by mouth daily.    [provider]  doxycycline (VIBRAMYCIN) 100 MG capsule Take 1 capsule (100 mg total) by mouth 2 (two) times daily for 7 days. 08/10/22 08/17/22  Melynda Ripple, NP  ELIQUIS 5 MG TABS tablet TAKE 1 TABLET TWICE DAILY 05/09/19   Volanda Napoleon, MD  ferrous sulfate 325 (65 FE) MG tablet Take 325 mg by mouth daily with breakfast.    [provider]  Fluocinolone Acetonide 0.01 % OIL Place 5 drops in ear(s) daily. 06/15/21   Lynden Oxford Scales, PA-C  furosemide (LASIX) 40 MG tablet Take 2 tablets (80 mg total) by mouth daily. 11/06/2019 Per pt the nephrologist increased Furosemide to 80 mg every AM and 40 mg every afternoon.  She may also take additional 40 mg in the afternoon for lower extremity swelling. 12/17/19   Croitoru, Mihai, MD  hydrALAZINE (APRESOLINE) 10 MG tablet TAKE 1 TABLET BY MOUTH 3 TIMES  DAILY 06/27/22   Croitoru, Mihai, MD  isosorbide mononitrate (IMDUR) 30 MG 24 hr tablet TAKE 1 TABLET BY MOUTH DAILY 06/27/22   Croitoru, Mihai, MD  rosuvastatin (CRESTOR) 10 MG tablet TAKE 1 TABLET BY MOUTH DAILY 08/05/22   Croitoru, Mihai, MD  TURMERIC PO Take 1,000 mg by mouth daily.  [provider]  vitamin B-12 (CYANOCOBALAMIN) 1000 MCG tablet Take 1,000 mcg by mouth daily.    [provider]  vitamin E 400 UNIT capsule Take 400 Units by mouth daily.    [provider]  fluticasone (FLONASE) 50 MCG/ACT nasal spray Place 2 sprays into the nose daily. 12/16/10 08/12/19  Rowe Clack, MD    Family History Family History  Problem Relation Age of Onset   Parkinson's disease Mother    Asthma Father    Diabetes Sister    Colon cancer Neg Hx    Stomach cancer Neg Hx    Esophageal cancer Neg Hx    Rectal cancer Neg Hx    Liver cancer Neg Hx     Social History Social History   Tobacco Use   Smoking status: Never   Smokeless tobacco: Never   Tobacco comments:    Lives alone-divorced  Vaping Use   Vaping Use: Never used  Substance Use Topics   Alcohol use: Yes    Alcohol/week: 0.0 standard drinks of alcohol    Comment: occ   Drug use: No     Allergies   Patient has no known allergies.   Review of Systems Review of Systems  Genitourinary:  Positive for frequency.  Musculoskeletal:  Positive for back pain.  As per HPI   Physical Exam Triage Vital Signs ED Triage Vitals  Enc Vitals Group     BP 08/15/22 1120 (!) 148/81     Pulse Rate 08/15/22 1120 69     Resp  08/15/22 1120 16     Temp 08/15/22 1120 98.1 F (36.7 C)     Temp Source 08/15/22 1120 Oral     SpO2 08/15/22 1120 95 %     Weight --      Height --      Head Circumference --      Peak Flow --      Pain Score 08/15/22 1119 5     Pain Loc --      Pain Edu? --      Excl. in [place]? --    No data found.  Updated Vital Signs BP (!) 148/81 (BP Location: Right Arm)   Pulse 69   Temp 98.1 F (36.7 C) (Oral)   Resp 16   SpO2 95%   Visual Acuity Right Eye Distance:   Left Eye Distance:   Bilateral Distance:    Right Eye Near:   Left Eye Near:    Bilateral Near:     Physical Exam Vitals and nursing note reviewed.  Constitutional:      General: She is not in acute distress.    Appearance: Normal appearance. She is obese. She is not ill-appearing, toxic-appearing or diaphoretic.  HENT:     Head: Normocephalic and atraumatic.  Eyes:     Pupils: Pupils are equal, round, and reactive to light.  Cardiovascular:     Rate and Rhythm: Normal rate.     Pulses: Normal pulses.     Heart sounds: Normal heart sounds. No murmur heard.    No friction rub. No gallop.  Pulmonary:     Effort: Pulmonary effort is normal. No respiratory distress.     Breath sounds: Normal breath sounds. No wheezing or rales.  Chest:     Chest wall: No tenderness.  Abdominal:     General: Abdomen is flat. Bowel sounds are normal. There is no distension.     Palpations: Abdomen is soft. There is  no mass.     Tenderness: There is no abdominal tenderness. There is no right CVA tenderness, left CVA tenderness, guarding or rebound.     Hernia: No hernia is present.  Musculoskeletal:        General: Tenderness (reproducible muscular back pain to B L-spine) present. No swelling.     Right lower leg: No edema.     Left lower leg: No edema.  Skin:    General: Skin is warm.     Findings: No bruising, erythema or rash.  Neurological:     General: No focal deficit present.     Mental Status: She is alert. Mental  status is at baseline.  Psychiatric:        Mood and Affect: Mood normal.      UC Treatments / Results  Labs (all labs ordered are listed, but only abnormal results are displayed) Labs Reviewed  POCT URINALYSIS DIP (MANUAL ENTRY)    EKG   Radiology No results found.  Procedures Procedures (including critical care time)  Medications Ordered in UC Medications - No data to display  Initial Impression / Assessment and Plan / UC Course  I have reviewed the triage vital signs and the nursing notes.  Pertinent labs & imaging results that were available during my care of the patient were reviewed by me and considered in my medical decision making (see chart for details).     Acute lower back pain -patient's symptoms sound muscular in nature.  Patient admits that it is improving from previous.  She may continue Tylenol and heating pads.  Consider intermittent use of topical lidocaine patch.  Patient denies any symptoms concerning for kidney stones or pyelonephritis. History of urinary frequency -patient completed the doxycycline, states that the symptoms have nearly resolved.  Has follow-up with nephrologist within the next month.   Final Clinical Impressions(s) / UC Diagnoses   Final diagnoses:  Acute bilateral low back pain without sciatica  Hx of urinary frequency     Discharge Instructions      Your low back pain sounds muscular in nature. You may take 1000 mg of Tylenol every 8 hours as needed. Please continue your moist heat to your lower back. Consider purchasing an over-the-counter pain patch such as the Salonpas lidocaine patch.  You may wear this for up to 12 hours as needed. Your urine sample in our clinic today is normal. Please follow-up with your nephrologist should your symptoms persist    ED Prescriptions   None    PDMP not reviewed this encounter.   Chaney Malling, [place] 08/15/22 1245

## 2022-08-15 NOTE — Discharge Instructions (Signed)
Your low back pain sounds muscular in nature. You may take 1000 mg of Tylenol every 8 hours as needed. Please continue your moist heat to your lower back. Consider purchasing an over-the-counter pain patch such as the Salonpas lidocaine patch.  You may wear this for up to 12 hours as needed. Your urine sample in our clinic today is normal. Please follow-up with your nephrologist should your symptoms persist

## 2022-08-16 NOTE — Progress Notes (Signed)
EPIC Encounter for ICM Monitoring  Patient Name: Patricia Morgan is a 72 y.o. female Date: 08/16/2022 Primary Care Physican: Antony Contras, MD Primary Cardiologist: Croitoru Electrophysiologist: Croitoru Nephrologist: Dr Marny Lowenstein at [facility]  10/25/2021 Weight: 200 lbs 10/29/2021 Weight: 199 lbs 12/13/2021 Weight: 196 lbs 02/09/2022 Weight: 204 lbs 06/10/2022 Weight  192 lbs 07/15/2022  Weight: 185 lbs 08/14/2022  Weight: 197 lbs     Spoke with patient and heart failure questions reviewed.  Transmission results reviewed.  Pt reports weight gain of 7-8 lbs due to she stopped taking Lasix on 3/23 due to UTI.  The UTI was causing incontinence when taking the lasix.  She resumed Lasix on 3/31 since UTI has resolved.  She has not weighed since restarting Lasix on 3/31.  2 Urgent care visits on 3/27 and 4/1 for UTI and then follow up.     Diet:  She is not strict on salt intake but does limit fluid intake to 64 oz daily   Optivol Thoracic impedance suggesting possible fluid accumulation starting 3/8 with exception of 3/25 at baseline.   Prescribed:  Furosemide 80 mg -  take 1 tablet(s) (80 mg total) by mouth daily.  Pt reports 4/2, her Nephrologist has instructed to take extra 40 mg for 1-2 days if fluid symptoms develop.     Labs: 04/13/2022 Creatinine 2.79, BUN 51,   Potassium 4.6, Sodium 141, GFR 17 02/22/2022 Creatinine 3.06, BUN 131, Potassium 4.8, Sodium 139, GFR 16  Care Everywhere 02/11/2022 Creatinine 2.78, BUN 109, Potassium 4.5, Sodium 140, GFR 18  Care Everywhere 11/11/2021 Creatinine 2.57, BUN 41,   Potassium 4.5, Sodium 140, GFR 19  Care Everywhere A complete set of results can be found in Results Review.   Recommendations:  Pt will follow Nephrologists recommendations to take Furosemide 120 mg every other day x 2 dosages.   Copy sent to Dr Sallyanne Kuster as Juluis Rainier.       Follow-up plan: ICM clinic phone appointment on 08/22/2022 to recheck fluid levels.   91 day device clinic remote  transmission 10/03/2022.     EP/Cardiology Office Visits:   09/01/2022 with Dr. Sallyanne Kuster.     Copy of ICM check sent to Dr. Sallyanne Kuster.    3 month ICM trend: 08/15/2022.    12-14 Month ICM trend:     Rosalene Billings, RN 08/16/2022 10:02 AM

## 2022-08-17 NOTE — Progress Notes (Signed)
Remote ICD transmission.   

## 2022-08-22 ENCOUNTER — Ambulatory Visit: Payer: 59 | Attending: Cardiovascular Disease

## 2022-08-22 DIAGNOSIS — Z95810 Presence of automatic (implantable) cardiac defibrillator: Secondary | ICD-10-CM

## 2022-08-22 DIAGNOSIS — I10 Essential (primary) hypertension: Secondary | ICD-10-CM | POA: Diagnosis not present

## 2022-08-22 DIAGNOSIS — N184 Chronic kidney disease, stage 4 (severe): Secondary | ICD-10-CM | POA: Diagnosis not present

## 2022-08-22 DIAGNOSIS — E7800 Pure hypercholesterolemia, unspecified: Secondary | ICD-10-CM | POA: Diagnosis not present

## 2022-08-22 DIAGNOSIS — E1122 Type 2 diabetes mellitus with diabetic chronic kidney disease: Secondary | ICD-10-CM | POA: Diagnosis not present

## 2022-08-22 DIAGNOSIS — I129 Hypertensive chronic kidney disease with stage 1 through stage 4 chronic kidney disease, or unspecified chronic kidney disease: Secondary | ICD-10-CM | POA: Diagnosis not present

## 2022-08-22 DIAGNOSIS — I5042 Chronic combined systolic (congestive) and diastolic (congestive) heart failure: Secondary | ICD-10-CM

## 2022-08-23 NOTE — Progress Notes (Signed)
EPIC Encounter for ICM Monitoring  Patient Name: Patricia Morgan is a 72 y.o. female Date: 08/23/2022 Primary Care Physican: Tally Joe, MD Primary Cardiologist: Croitoru Electrophysiologist: Croitoru Nephrologist: Dr Thomes Cake at [facility]  07/15/2022  Weight: 185 lbs 08/14/2022 Weight: 197 lbs 08/23/2022   Weight: 194 lbs     Spoke with patient and heart failure questions reviewed.  Transmission results reviewed.  Pt reports weight loss after losing fluid.  She now has a bad cold and staying bed.    Diet:  She is not strict on salt intake but does limit fluid intake to 64 oz daily    Optivol Thoracic impedance suggesting fluid levels returned to baseline after taking extra Furosemide per Nephrologist instructions.    Prescribed:  Furosemide 80 mg -  take 1 tablet(s) (80 mg total) by mouth daily.  Pt reports 4/2, her Nephrologist has instructed to take extra 40 mg for 1-2 days if fluid symptoms develop.     Labs: 04/13/2022 Creatinine 2.79, BUN 51,   Potassium 4.6, Sodium 141, GFR 17 02/22/2022 Creatinine 3.06, BUN 131, Potassium 4.8, Sodium 139, GFR 16  Care Everywhere 02/11/2022 Creatinine 2.78, BUN 109, Potassium 4.5, Sodium 140, GFR 18  Care Everywhere 11/11/2021 Creatinine 2.57, BUN 41,   Potassium 4.5, Sodium 140, GFR 19  Care Everywhere A complete set of results can be found in Results Review.   Recommendations:  No changes and encouraged to call if experiencing any fluid symptoms.      Follow-up plan: ICM clinic phone appointment on 09/19/2022.   91 day device clinic remote transmission 10/03/2022.     EP/Cardiology Office Visits:   09/01/2022 with Dr. Royann Shivers.     Copy of ICM check sent to Dr. Royann Shivers.   3 month ICM trend: 08/22/2022.    12-14 Month ICM trend:     Karie Soda, RN 08/23/2022 1:31 PM

## 2022-09-01 ENCOUNTER — Ambulatory Visit: Payer: 59 | Attending: Cardiovascular Disease | Admitting: Cardiovascular Disease

## 2022-09-01 ENCOUNTER — Encounter: Payer: Self-pay | Admitting: Cardiovascular Disease

## 2022-09-01 VITALS — BP 112/60 | HR 74 | Ht 62.0 in | Wt 198.6 lb

## 2022-09-01 DIAGNOSIS — I5042 Chronic combined systolic (congestive) and diastolic (congestive) heart failure: Secondary | ICD-10-CM | POA: Diagnosis not present

## 2022-09-01 DIAGNOSIS — E1169 Type 2 diabetes mellitus with other specified complication: Secondary | ICD-10-CM | POA: Diagnosis not present

## 2022-09-01 DIAGNOSIS — I10 Essential (primary) hypertension: Secondary | ICD-10-CM | POA: Diagnosis not present

## 2022-09-01 DIAGNOSIS — R9431 Abnormal electrocardiogram [ECG] [EKG]: Secondary | ICD-10-CM

## 2022-09-01 DIAGNOSIS — N184 Chronic kidney disease, stage 4 (severe): Secondary | ICD-10-CM

## 2022-09-01 DIAGNOSIS — Z95810 Presence of automatic (implantable) cardiac defibrillator: Secondary | ICD-10-CM

## 2022-09-01 DIAGNOSIS — I428 Other cardiomyopathies: Secondary | ICD-10-CM | POA: Diagnosis not present

## 2022-09-01 DIAGNOSIS — Z86718 Personal history of other venous thrombosis and embolism: Secondary | ICD-10-CM

## 2022-09-01 DIAGNOSIS — Z6835 Body mass index (BMI) 35.0-35.9, adult: Secondary | ICD-10-CM

## 2022-09-01 DIAGNOSIS — G4733 Obstructive sleep apnea (adult) (pediatric): Secondary | ICD-10-CM

## 2022-09-01 DIAGNOSIS — D6851 Activated protein C resistance: Secondary | ICD-10-CM | POA: Diagnosis not present

## 2022-09-01 DIAGNOSIS — E669 Obesity, unspecified: Secondary | ICD-10-CM

## 2022-09-01 NOTE — Patient Instructions (Signed)
Medication Instructions:  No changes *If you need a refill on your cardiac medications before your next appointment, please call your pharmacy*  Follow-Up: At [facility], you and your health needs are our priority.  As part of our continuing mission to provide you with exceptional heart care, we have created designated Provider Care Teams.  These Care Teams include your primary Cardiologist (physician) and Advanced Practice Providers (APPs -  Physician Assistants and Nurse Practitioners) who all work together to provide you with the care you need, when you need it.  We recommend signing up for the patient portal called "MyChart".  Sign up information is provided on this After Visit Summary.  MyChart is used to connect with patients for Virtual Visits (Telemedicine).  Patients are able to view lab/test results, encounter notes, upcoming appointments, etc.  Non-urgent messages can be sent to your provider as well.   To learn more about what you can do with MyChart, go to [email or web address].    Your next appointment:   1 year(s)  Provider:   Mihai Croitoru, MD     

## 2022-09-04 ENCOUNTER — Encounter: Payer: Self-pay | Admitting: Cardiovascular Disease

## 2022-09-04 NOTE — Progress Notes (Signed)
Cardiology Office Note    Date:  09/04/2022   ID:  Patricia Morgan, DOB 28-May-1950, MRN [id number]  PCP:  Tally Joe, MD  Cardiologist:   Thurmon Fair, MD   Chief Complaint  Patient presents with   Congestive Heart Failure    History of Present Illness:  Patricia Morgan is a 72 y.o. 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  She has a single-chamber Medtronic Visia ICD with AF detection capability.  She had a fairly uneventful year from a cardiovascular point of view.  She has not had any hospitalizations for heart failure and has not required any adjustments in her loop diuretic dose.  She has chronic moderate ankle swelling symmetrically.  The patient specifically denies any chest pain at rest or with exertion, dyspnea at rest or with exertion, orthopnea, paroxysmal nocturnal dyspnea, syncope, palpitations, focal neurological deficits, intermittent claudication, change in lower extremity edema, unexplained weight gain, cough, hemoptysis or wheezing.  There is on the second story and can climb 2 flights of stairs without difficulty.  Last month she did have a brief episode where her OptiVol was out of range during a urinary tract infection.  Both the infection and the temporary abnormality thoracic impedance resolved quickly.  Otherwise device interrogation shows normal findings.  She has not had any episodes of high ventricular rates or atrial fibrillation and she does not require ventricular pacing.  Generator longevity 7.8 years and lead parameters are normal.  Weight today is 198 pounds, lower than it was a year ago at 205 pounds.  Note that her home scale shows about 7-8 pounds less than she wears a heavy  breast prosthesis.  She has a new nephrologist at [facility], Dr. Thomes Cake.  Her creatinine most recently has been 2.8-3.1 (GFR 15-19).  Her most recent echo performed in October 2019 showed severely decreased left ventricular systolic function with an ejection fraction of 25%.  A nuclear stress test was performed and shows a fixed inferior wall defect that I suspect is diaphragmatic attenuation artifact.  There was no reversible ischemia.  A similar EF of 22% was reported.  She is compliant with carvedilol in the maximum usual dose and also takes hydralazine/nitrates at the highest dose tolerated by her blood pressure.  She is not receiving RAAS inhibitors due to chronic kidney disease.  Coronary angiography performed June 2017 showed no evidence of coronary artery disease but confirmed elevated filling pressure. PA pressure was 57/18 in the setting of a mean pulmonary wedge pressure of 33 mmHg. Echo showed ejection fraction of 45%. Nuclear scintigraphy calculated ejection fraction of 38%. It also suggested the presence of perfusion abnormalities, false positive by coronary angiography. She sees Dr. Arthur Holms at [facility] for chronic kidney disease and her baseline creatinine is around 2.7-3.1. Her primary care physician is Dr. Tally Joe. Dr. Vassie Loll manages her sleep apnea treatment.  Her sister Cindie Crumbly had coronary artery disease and long QT syndrome and passed away in 09/26/2017.  Past Medical History:  Diagnosis Date   ANEMIA-NOS    BREAST CANCER, HX OF 1994 & 1995   1994>>recurrence in Sep 26, 1993, R breast, s/p lumpectomy>>mastectomy   Cancer    Cardiomyopathy, nonischemic    CHF (congestive heart failure)    chronic systolic and diastolic CHF   CHRONIC KIDNEY DISEASE STAGE III (MODERATE)  Clotting disorder    on eliquis for Factor 5 disorder   Colon polyps    DIABETES MELLITUS, TYPE II    Diverticulosis    HYPERLIPIDEMIA    HYPERTENSION    ILD (interstitial lung disease)    10/2018  CT findings suggestive of sarcoidosis; followed by Dr. Vassie Loll   OBSTRUCTIVE SLEEP APNEA    CPAP   Personal history of chemotherapy    Proteinuria    PULMONARY EMBOLISM 09/24/2008   anticoag thru 03/2010   Rotator cuff tear    Left   Shoulder impingement, left    Sleep apnea    Wears CPAP nightly    Past Surgical History:  Procedure Laterality Date   BREAST SURGERY     CARDIAC CATHETERIZATION N/A 11/02/2015   Procedure: Right/Left Heart Cath and Coronary Angiography;  Surgeon: Kathleene Hazel, MD;  Location: [facility] INVASIVE CV LAB;  Service: Cardiovascular;  Laterality: N/A;   DILATION AND CURETTAGE OF UTERUS     ICD IMPLANT N/A 03/14/2018   Procedure: ICD IMPLANT;  Surgeon: Thurmon Fair, MD;  Location: [facility] INVASIVE CV LAB;  Service: Cardiovascular;  Laterality: N/A;   insertion port a cath     MASTECTOMY Right 1995   Right   PORT-A-CATH REMOVAL     SHOULDER ARTHROSCOPY WITH ROTATOR CUFF REPAIR AND SUBACROMIAL DECOMPRESSION Left 01/24/2019   Procedure: SHOULDER ARTHROSCOPY WITH ROTATOR CUFF REPAIR AND SUBACROMIAL DECOMPRESSION;  Surgeon: Yolonda Kida, MD;  Location: [facility] OR;  Service: Orthopedics;  Laterality: Left;  2.5 hrs    Current Medications: Outpatient Medications Prior to Visit  Medication Sig Dispense Refill   allopurinol (ZYLOPRIM) 100 MG tablet Take 100 mg by mouth 2 (two) times daily.      Blood Glucose Monitoring Suppl (TRUE METRIX AIR GLUCOSE METER) w/Device KIT      carvedilol (COREG) 25 MG tablet TAKE 1 TABLET BY MOUTH TWICE  DAILY 200 tablet 2   Cholecalciferol (VITAMIN D-3) 5000 units TABS Take 5,000 Units by mouth daily.      cholecalciferol (VITAMIN D3) 25 MCG (1000 UNIT) tablet Take 1,000 Units by mouth daily.     diphenhydrAMINE (BENADRYL) 25 MG tablet Take 25 mg by mouth daily.     ELIQUIS 5 MG TABS tablet TAKE 1 TABLET TWICE DAILY 180 tablet 6   ferrous sulfate 325 (65 FE) MG tablet Take 325 mg by mouth daily with breakfast.     Fluocinolone  Acetonide 0.01 % OIL Place 5 drops in ear(s) daily. 20 mL 0   furosemide (LASIX) 80 MG tablet Take 80 mg by mouth daily.     hydrALAZINE (APRESOLINE) 10 MG tablet TAKE 1 TABLET BY MOUTH 3 TIMES  DAILY 300 tablet 3   isosorbide mononitrate (IMDUR) 30 MG 24 hr tablet TAKE 1 TABLET BY MOUTH DAILY 100 tablet 2   rosuvastatin (CRESTOR) 10 MG tablet TAKE 1 TABLET BY MOUTH DAILY 100 tablet 2   vitamin B-12 (CYANOCOBALAMIN) 1000 MCG tablet Take 1,000 mcg by mouth daily.     vitamin E 400 UNIT capsule Take 400 Units by mouth daily.     acetaminophen (TYLENOL) 500 MG tablet Take 500 mg by mouth every 6 (six) hours as needed for mild pain. (Patient not taking: Reported on 09/01/2022)     colchicine 0.6 MG tablet Take 0.6 mg by mouth as needed. (Patient not taking: Reported on 09/01/2022)  1   furosemide (LASIX) 40 MG tablet Take 2 tablets (80 mg total) by mouth daily. 11/06/2019 Per pt  the nephrologist increased Furosemide to 80 mg every AM and 40 mg every afternoon.  She may also take additional 40 mg in the afternoon for lower extremity swelling. (Patient not taking: Reported on 09/01/2022)     TURMERIC PO Take 1,000 mg by mouth daily. (Patient not taking: Reported on 09/01/2022)     No facility-administered medications prior to visit.     Allergies:   Patient has no known allergies.   Social History   Socioeconomic History   Marital status: Divorced    Spouse name: Not on file   Number of children: 2   Years of education: 12   Highest education level: Not on file  Occupational History   Occupation: retired  Tobacco Use   Smoking status: Never   Smokeless tobacco: Never   Tobacco comments:    Lives alone-divorced  Vaping Use   Vaping Use: Never used  Substance and Sexual Activity   Alcohol use: Yes    Alcohol/week: 0.0 standard drinks of alcohol    Comment: occ   Drug use: No   Sexual activity: Not on file  Other Topics Concern   Not on file  Social History Narrative   Not on file    Social Determinants of Health   Financial Resource Strain: Not on file  Food Insecurity: Not on file  Transportation Needs: Not on file  Physical Activity: Not on file  Stress: Not on file  Social Connections: Not on file     Family History:  The patient's family history includes Asthma in her father; Diabetes in her sister; Parkinson's disease in her mother. Her sister had long QT syndrome, but no documented ventricular arrhythmia  ROS:   Please see the history of present illness.    All other systems are reviewed and are negative.   PHYSICAL EXAM:   VS:  BP 112/60 (BP Location: Right Arm, Patient Position: Sitting, Cuff Size: Large)   Pulse 74   Ht  (1.575 m)   Wt 198 lb 9.6 oz (90.1 kg)   SpO2 96%   BMI 36.32 kg/m       General: Alert, oriented x3, no distress, moderate to severely obese Head: no evidence of trauma, PERRL, EOMI, no exophtalmos or lid lag, no myxedema, no xanthelasma; normal ears, nose and oropharynx Neck: normal jugular venous pulsations and no hepatojugular reflux; brisk carotid pulses without delay and no carotid bruits Chest: clear to auscultation, no signs of consolidation by percussion or palpation, normal fremitus, symmetrical and full respiratory excursions Cardiovascular: normal position and quality of the apical impulse, regular rhythm, normal first and second heart sounds, no murmurs, rubs or gallops Abdomen: no tenderness or distention, no masses by palpation, no abnormal pulsatility or arterial bruits, normal bowel sounds, no hepatosplenomegaly Extremities: no clubbing, cyanosis;, symmetrical 2+ edema; 2+ radial, ulnar and brachial pulses bilaterally; 2+ right femoral, posterior tibial and dorsalis pedis pulses; 2+ left femoral, posterior tibial and dorsalis pedis pulses; no subclavian or femoral bruits Neurological: grossly nonfocal Psych: Normal mood and affect     Wt Readings from Last 3 Encounters:  09/01/22 198 lb 9.6 oz (90.1 kg)   07/26/22 202 lb (91.6 kg)  10/01/21 209 lb (94.8 kg)      Studies/Labs Reviewed:   EKG:  EKG is ordered today and is unchanged from baseline.  It shows normal sinus rhythm with first-degree AV block (PR 248 ms), LVH with QRS widening (116 ms), T wave inversion leads I and aVL, QTc 473 ms.  Recent Labs: No results found for requested labs within last 365 days.   03/16/2020 Dr. Azucena Cecil Creat 2.91, K 4.8, Hgb 11.9 Chol 172, HDL 46, LDL 91, TG 106 Hgb A1c 6.2%.  Lipid Panel    Component Value Date/Time   CHOL 184 09/13/2012 1357   TRIG 142 09/13/2012 1357   HDL 45 09/13/2012 1357   CHOLHDL 4.1 09/13/2012 1357   VLDL 28 09/13/2012 1357   LDLCALC 111 (H) 09/13/2012 1357   LDLDIRECT 141.3 10/14/2008 1004   ASSESSMENT:    1. Chronic combined systolic and diastolic CHF (congestive heart failure)   2. ICD (implantable cardioverter-defibrillator) in place   3. Non-ischemic cardiomyopathy   4. Long QT interval   5. History of venous thromboembolism   6. Factor V Leiden   7. Essential hypertension   8. OSA (obstructive sleep apnea)   9. CKD (chronic kidney disease) stage 4, GFR 15-29 ml/min   10. Severe obesity (BMI 35.0-35.9 with comorbidity)   11. Type 2 diabetes mellitus with obesity     PLAN:  In order of problems listed above:  CHF: Other than her chronic mild pedal edema she appears clinically euvolemic, NYHA functional class I.  OptiVol is in normal range. Continue carvedilol and hydralazine/nitrates, avoiding RAAS inhibitors due to CKD stage IV.   Nonischemic cardiomyopathy: Possibly secondary to chemotherapy. Normal coronary arteries by angiography just a couple of years ago.  History of possible Adriamycin chemotherapy for breast cancer in 1994, possible familial cardiomyopathy. Long QT: Remains mildly prolonged at 490 ms, but her QRS is also modestly prolonged at 116 ms with a nonspecific IVCD.  Her sister had long QT syndrome.  Patricia Morgan's QT interval has always been  borderline prolonged usually in the 460-480 ms range.  Generally wise to avoid QT prolonging medications. ICD: Normal device function.  Checking thoracic impedance monthly improved downloads every 3 months. Recurrent DVT/PE: No recent thromboembolic events.  Planned lifelong anticoagulation with factor V Leiden. Anticoagulation: Well-tolerated, no bleeding complications. HTN: Well-controlled. OSA: Followed by Dr. Vassie Loll.  Reports compliance and denies daytime hypersomnolence.   CKD 4: Appears to be stable.  GFR is 15-20.  Now seeing a new nephrologist with [facility], Dr. Arthur Holms. Obesity: She's actually lost a little bit of weight since last year. DM: Very well-controlled.  Most recent hemoglobin A1c 6.0%.   HLP: LDL71.  Continue statin   Medication Adjustments/Labs and Tests Ordered: Current medicines are reviewed at length with the patient today.  Concerns regarding medicines are outlined above.  Medication changes, Labs and Tests ordered today are listed in the Patient Instructions below. Patient Instructions  Medication Instructions:  No changes *If you need a refill on your cardiac medications before your next appointment, please call your pharmacy*  Follow-Up: At [facility], you and your health needs are our priority.  As part of our continuing mission to provide you with exceptional heart care, we have created designated Provider Care Teams.  These Care Teams include your primary Cardiologist (physician) and Advanced Practice Providers (APPs -  Physician Assistants and Nurse Practitioners) who all work together to provide you with the care you need, when you need it.  We recommend signing up for the patient portal called "MyChart".  Sign up information is provided on this After Visit Summary.  MyChart is used to connect with patients for Virtual Visits (Telemedicine).  Patients are able to view lab/test results, encounter notes, upcoming appointments, etc.  Non-urgent messages  can be sent to your  provider as well.   To learn more about what you can do with MyChart, go to [email or web address].    Your next appointment:   1 year(s)  Provider:   Thurmon Fair, MD       Signed, Thurmon Fair, MD  09/04/2022 5:30 PM    [facility] [address] [place], [place], [place]  [address] Phone: [phone]; Fax: [phone]

## 2022-09-14 DIAGNOSIS — H60333 Swimmer's ear, bilateral: Secondary | ICD-10-CM | POA: Diagnosis not present

## 2022-09-14 DIAGNOSIS — H6123 Impacted cerumen, bilateral: Secondary | ICD-10-CM | POA: Diagnosis not present

## 2022-09-19 ENCOUNTER — Ambulatory Visit: Payer: 59 | Attending: Cardiovascular Disease

## 2022-09-19 DIAGNOSIS — I5042 Chronic combined systolic (congestive) and diastolic (congestive) heart failure: Secondary | ICD-10-CM | POA: Diagnosis not present

## 2022-09-19 DIAGNOSIS — Z95810 Presence of automatic (implantable) cardiac defibrillator: Secondary | ICD-10-CM

## 2022-09-19 NOTE — Progress Notes (Signed)
EPIC Encounter for ICM Monitoring  Patient Name: Patricia Morgan is a 72 y.o. female Date: 09/19/2022 Primary Care Physican: Tally Joe, MD Primary Cardiologist: Cro[place]u Electrophysiologist: Croitoru Nephrologist: Dr Thomes Cake at [address] [facility]  07/15/2022  Weight: 185 lbs 08/14/2022 Weight: 197 lbs 08/23/2022   Weight: 194 lbs 09/19/2022   Weight: 194 lbs     Spoke with patient and heart failure questions reviewed.  Transmission results reviewed.  Pt asymptomatic for fluid accumulation.  Reports feeling well at this time and voices no complaints.  She is starting to exercise.   Diet:  She is not strict on salt intake but does limit fluid intake to 64 oz daily.   Optivol Thoracic impedance suggesting possible fluid accumulation starting 4/28.    Prescribed:  Furosemide 80 mg take 1 tablet(s) (80 mg total) by mouth daily.    She takes extra Lasix 40 mg when needed.     Labs: 04/13/2022 Creatinine 2.79, BUN 51,   Potassium 4.6, Sodium 141, GFR 17 02/22/2022 Creatinine 3.06, BUN 131, Potassium 4.8, Sodium 139, GFR 16  Care Everywhere 02/11/2022 Creatinine 2.78, BUN 109, Potassium 4.5, Sodium 140, GFR 18  Care Everywhere 11/11/2021 Creatinine 2.57, BUN 41,   Potassium 4.5, Sodium 140, GFR 19  Care Everywhere A complete set of results can be found in Results Review.   Recommendations:  Recommendation to limit salt intake to 2000 mg daily and fluid intake to 64 oz daily.  Encouraged to call if experiencing any fluid symptoms.     Follow-up plan: ICM clinic phone appointment on 09/26/2022 to recheck fluid levels.   91 day device clinic remote transmission 10/03/2022.     EP/Cardiology Office Visits:   Recall 08/27/2023 with Dr. Royann Shivers.     Copy of ICM check sent to Dr. Royann Shivers.   3 month ICM trend: 09/19/2022.    12-14 Month ICM trend:     Karie Soda, RN 09/19/2022 1:39 PM

## 2022-09-21 DIAGNOSIS — N184 Chronic kidney disease, stage 4 (severe): Secondary | ICD-10-CM | POA: Diagnosis not present

## 2022-09-26 ENCOUNTER — Ambulatory Visit: Payer: 59 | Attending: Cardiovascular Disease

## 2022-09-26 DIAGNOSIS — Z95810 Presence of automatic (implantable) cardiac defibrillator: Secondary | ICD-10-CM

## 2022-09-26 DIAGNOSIS — I5042 Chronic combined systolic (congestive) and diastolic (congestive) heart failure: Secondary | ICD-10-CM

## 2022-09-26 NOTE — Progress Notes (Signed)
EPIC Encounter for ICM Monitoring  Patient Name: Patricia Morgan is a 72 y.o. female Date: 09/26/2022 Primary Care Physican: Tally Joe, MD Primary Cardiologist: Croitoru Electrophysiologist: Croitoru Nephrologist: Dr Thomes Cake at [facility]  07/15/2022  Weight: 185 lbs 08/14/2022 Weight: 197 lbs 08/23/2022   Weight: 194 lbs 09/19/2022   Weight: 194 lbs     Spoke with patient and heart failure questions reviewed.  Transmission results reviewed.  Pt asymptomatic for fluid accumulation.  Reports feeling well at this time and voices no complaints.  She asked if she should hold a BP dosage because was 104 and 115 today.  She feels tired but otherwise without complaints of dizziness or lightheadedness.   Diet:  She is not strict on salt intake but does limit fluid intake to 64 oz daily.   Optivol Thoracic impedance suggesting fluid levels returned normal.    Prescribed:  Furosemide 80 mg take 1 tablet(s) (80 mg total) by mouth daily.    She takes extra Lasix 40 mg when needed.     Labs: 04/13/2022 Creatinine 2.79, BUN 51,   Potassium 4.6, Sodium 141, GFR 17 02/22/2022 Creatinine 3.06, BUN 131, Potassium 4.8, Sodium 139, GFR 16  Care Everywhere 02/11/2022 Creatinine 2.78, BUN 109, Potassium 4.5, Sodium 140, GFR 18  Care Everywhere 11/11/2021 Creatinine 2.57, BUN 41,   Potassium 4.5, Sodium 140, GFR 19  Care Everywhere A complete set of results can be found in Results Review.   Recommendations:  Advised BP systolic within normal range and should not hold BP med unless she begins to feel faint, lightheaded or dizzy.  Advised to contact Dr Royann Shivers if she has any further BP follow up questions. No changes and encouraged to call if experiencing any fluid symptoms.   Follow-up plan: ICM clinic phone appointment on 10/24/2022.   91 day device clinic remote transmission 10/03/2022.     EP/Cardiology Office Visits:   Recall 08/27/2023 with Dr. Royann Shivers.     Copy of ICM check sent to Dr. Royann Shivers.   3  month ICM trend: 09/26/2022.    12-14 Month ICM trend:     Karie Soda, RN 09/26/2022 10:01 AM

## 2022-10-03 ENCOUNTER — Ambulatory Visit (INDEPENDENT_AMBULATORY_CARE_PROVIDER_SITE_OTHER): Payer: 59

## 2022-10-03 DIAGNOSIS — I428 Other cardiomyopathies: Secondary | ICD-10-CM | POA: Diagnosis not present

## 2022-10-03 LAB — CUP PACEART REMOTE DEVICE CHECK
Battery Remaining Longevity: 93 mo
Battery Voltage: 2.99 V
Brady Statistic RV Percent Paced: 0.01 %
Date Time Interrogation Session: 20240520043725
HighPow Impedance: 69 Ohm
Implantable Lead Connection Status: 753985
Implantable Lead Implant Date: 20191030
Implantable Lead Location: 753860
Implantable Pulse Generator Implant Date: 20191030
Lead Channel Impedance Value: 342 Ohm
Lead Channel Impedance Value: 399 Ohm
Lead Channel Pacing Threshold Amplitude: 0.75 V
Lead Channel Pacing Threshold Pulse Width: 0.4 ms
Lead Channel Sensing Intrinsic Amplitude: 29 mV
Lead Channel Sensing Intrinsic Amplitude: 29 mV
Lead Channel Setting Pacing Amplitude: 2 V
Lead Channel Setting Pacing Pulse Width: 0.4 ms
Lead Channel Setting Sensing Sensitivity: 0.3 mV
Zone Setting Status: 755011
Zone Setting Status: 755011

## 2022-10-13 DIAGNOSIS — E1122 Type 2 diabetes mellitus with diabetic chronic kidney disease: Secondary | ICD-10-CM | POA: Diagnosis not present

## 2022-10-13 DIAGNOSIS — I129 Hypertensive chronic kidney disease with stage 1 through stage 4 chronic kidney disease, or unspecified chronic kidney disease: Secondary | ICD-10-CM | POA: Diagnosis not present

## 2022-10-13 DIAGNOSIS — N184 Chronic kidney disease, stage 4 (severe): Secondary | ICD-10-CM | POA: Diagnosis not present

## 2022-10-13 DIAGNOSIS — E7800 Pure hypercholesterolemia, unspecified: Secondary | ICD-10-CM | POA: Diagnosis not present

## 2022-10-13 DIAGNOSIS — I10 Essential (primary) hypertension: Secondary | ICD-10-CM | POA: Diagnosis not present

## 2022-10-15 DIAGNOSIS — G4733 Obstructive sleep apnea (adult) (pediatric): Secondary | ICD-10-CM | POA: Diagnosis not present

## 2022-10-19 DIAGNOSIS — G4733 Obstructive sleep apnea (adult) (pediatric): Secondary | ICD-10-CM | POA: Diagnosis not present

## 2022-10-24 ENCOUNTER — Ambulatory Visit: Payer: 59 | Attending: Cardiovascular Disease

## 2022-10-24 DIAGNOSIS — Z95810 Presence of automatic (implantable) cardiac defibrillator: Secondary | ICD-10-CM | POA: Diagnosis not present

## 2022-10-24 DIAGNOSIS — I5042 Chronic combined systolic (congestive) and diastolic (congestive) heart failure: Secondary | ICD-10-CM

## 2022-10-25 NOTE — Progress Notes (Signed)
EPIC Encounter for ICM Monitoring  Patient Name: Patricia Morgan is a 72 y.o. female Date: 10/25/2022 Primary Care Physican: Tally Joe, MD Primary Cardiologist: Croitoru Electrophysiologist: Croitoru Nephrologist: Dr Thomes Cake at [facility]  07/15/2022  Weight: 185 lbs 08/14/2022 Weight: 197 lbs 08/23/2022   Weight: 194 lbs 09/19/2022   Weight: 194 lbs     Spoke with patient and heart failure questions reviewed.  Transmission results reviewed.  Pt asymptomatic for fluid accumulation.  Reports feeling well at this time and voices no complaints.  Unsure what may be causing days with fluid accumulation.  She is traveling for about 10 days in June.    Diet:  She is not strict on salt intake but does limit fluid intake to 64 oz daily.   Optivol Thoracic impedance suggesting intermittent days with possible fluid accumulation within the last month.    Prescribed:  Furosemide 80 mg take 1 tablet(s) (80 mg total) by mouth daily.    She takes extra Lasix 40 mg when needed.     Labs: 09/21/2022 Creatinine 2.88, BUN 43, Potassium 4.1, Sodium 140, GFR 17 A complete set of results can be found in Results Review.   Recommendations: Recommendation to limit salt intake to 2000 mg daily and fluid intake to 64 oz daily.  Encouraged to call if experiencing any fluid symptoms.    Follow-up plan: ICM clinic phone appointment on 11/28/2022.   91 day device clinic remote transmission 01/02/2023.     EP/Cardiology Office Visits:   Recall 08/27/2023 with Dr. Royann Shivers.     Copy of ICM check sent to Dr. Royann Shivers.   3 month ICM trend: 10/24/2022.    12-14 Month ICM trend:     Karie Soda, RN 10/25/2022 4:39 PM

## 2022-10-31 NOTE — Progress Notes (Signed)
Remote ICD transmission.   

## 2022-11-01 DIAGNOSIS — N184 Chronic kidney disease, stage 4 (severe): Secondary | ICD-10-CM | POA: Diagnosis not present

## 2022-11-18 DIAGNOSIS — N184 Chronic kidney disease, stage 4 (severe): Secondary | ICD-10-CM | POA: Diagnosis not present

## 2022-11-18 DIAGNOSIS — G4733 Obstructive sleep apnea (adult) (pediatric): Secondary | ICD-10-CM | POA: Diagnosis not present

## 2022-11-28 ENCOUNTER — Ambulatory Visit: Payer: 59 | Attending: Cardiovascular Disease

## 2022-11-28 DIAGNOSIS — E1122 Type 2 diabetes mellitus with diabetic chronic kidney disease: Secondary | ICD-10-CM | POA: Diagnosis not present

## 2022-11-28 DIAGNOSIS — I429 Cardiomyopathy, unspecified: Secondary | ICD-10-CM | POA: Diagnosis not present

## 2022-11-28 DIAGNOSIS — N184 Chronic kidney disease, stage 4 (severe): Secondary | ICD-10-CM | POA: Diagnosis not present

## 2022-11-28 DIAGNOSIS — Z853 Personal history of malignant neoplasm of breast: Secondary | ICD-10-CM | POA: Diagnosis not present

## 2022-11-28 DIAGNOSIS — D649 Anemia, unspecified: Secondary | ICD-10-CM | POA: Diagnosis not present

## 2022-11-28 DIAGNOSIS — I5042 Chronic combined systolic (congestive) and diastolic (congestive) heart failure: Secondary | ICD-10-CM

## 2022-11-28 DIAGNOSIS — D6851 Activated protein C resistance: Secondary | ICD-10-CM | POA: Diagnosis not present

## 2022-11-28 DIAGNOSIS — Z95810 Presence of automatic (implantable) cardiac defibrillator: Secondary | ICD-10-CM

## 2022-11-28 DIAGNOSIS — M109 Gout, unspecified: Secondary | ICD-10-CM | POA: Diagnosis not present

## 2022-11-28 DIAGNOSIS — D6869 Other thrombophilia: Secondary | ICD-10-CM | POA: Diagnosis not present

## 2022-11-28 DIAGNOSIS — M810 Age-related osteoporosis without current pathological fracture: Secondary | ICD-10-CM | POA: Diagnosis not present

## 2022-11-28 DIAGNOSIS — E7800 Pure hypercholesterolemia, unspecified: Secondary | ICD-10-CM | POA: Diagnosis not present

## 2022-11-28 DIAGNOSIS — E559 Vitamin D deficiency, unspecified: Secondary | ICD-10-CM | POA: Diagnosis not present

## 2022-11-28 DIAGNOSIS — I272 Pulmonary hypertension, unspecified: Secondary | ICD-10-CM | POA: Diagnosis not present

## 2022-11-28 DIAGNOSIS — I129 Hypertensive chronic kidney disease with stage 1 through stage 4 chronic kidney disease, or unspecified chronic kidney disease: Secondary | ICD-10-CM | POA: Diagnosis not present

## 2022-12-04 NOTE — Progress Notes (Signed)
EPIC Encounter for ICM Monitoring  Patient Name: Patricia Morgan is a 72 y.o. female Date: 12/04/2022 Primary Care Physican: Tally Joe, MD Primary Cardiologist: Croitoru Electrophysiologist: Croitoru Nephrologist: Dr Thomes Cake at [facility]  07/15/2022  Weight: 185 lbs 08/14/2022 Weight: 197 lbs 08/23/2022   Weight: 194 lbs 09/19/2022   Weight: 194 lbs     Transmission results reviewed.     Diet:  She is not strict on salt intake but does limit fluid intake to 64 oz daily.   Optivol Thoracic impedance suggesting intermittent days with possible fluid accumulation within the last month (traveled 10 days in June).    Prescribed:  Furosemide 80 mg take 1 tablet(s) (80 mg total) by mouth daily.    She takes extra Lasix 40 mg when needed.     Labs: 09/21/2022 Creatinine 2.88, BUN 43, Potassium 4.1, Sodium 140, GFR 17 A complete set of results can be found in Results Review.   Recommendations:  No changes.     Follow-up plan: ICM clinic phone appointment on 01/03/2023.   91 day device clinic remote transmission 01/02/2023.     EP/Cardiology Office Visits:   Recall 08/27/2023 with Dr. Royann Shivers.     Copy of ICM check sent to Dr. Royann Shivers.   3 month ICM trend: 11/28/2022.    12-14 Month ICM trend:     Karie Soda, RN 12/04/2022 12:21 PM

## 2022-12-12 DIAGNOSIS — C50911 Malignant neoplasm of unspecified site of right female breast: Secondary | ICD-10-CM | POA: Diagnosis not present

## 2022-12-15 DIAGNOSIS — C50911 Malignant neoplasm of unspecified site of right female breast: Secondary | ICD-10-CM | POA: Diagnosis not present

## 2022-12-19 DIAGNOSIS — G4733 Obstructive sleep apnea (adult) (pediatric): Secondary | ICD-10-CM | POA: Diagnosis not present

## 2022-12-20 DIAGNOSIS — C50911 Malignant neoplasm of unspecified site of right female breast: Secondary | ICD-10-CM | POA: Diagnosis not present

## 2022-12-29 DIAGNOSIS — Z961 Presence of intraocular lens: Secondary | ICD-10-CM | POA: Diagnosis not present

## 2022-12-29 DIAGNOSIS — H43822 Vitreomacular adhesion, left eye: Secondary | ICD-10-CM | POA: Diagnosis not present

## 2022-12-29 DIAGNOSIS — E113212 Type 2 diabetes mellitus with mild nonproliferative diabetic retinopathy with macular edema, left eye: Secondary | ICD-10-CM | POA: Diagnosis not present

## 2022-12-29 DIAGNOSIS — H35073 Retinal telangiectasis, bilateral: Secondary | ICD-10-CM | POA: Diagnosis not present

## 2023-01-02 ENCOUNTER — Ambulatory Visit (INDEPENDENT_AMBULATORY_CARE_PROVIDER_SITE_OTHER): Payer: 59

## 2023-01-02 DIAGNOSIS — I428 Other cardiomyopathies: Secondary | ICD-10-CM

## 2023-01-03 ENCOUNTER — Ambulatory Visit: Payer: 59 | Attending: Cardiovascular Disease

## 2023-01-03 DIAGNOSIS — Z95810 Presence of automatic (implantable) cardiac defibrillator: Secondary | ICD-10-CM | POA: Diagnosis not present

## 2023-01-03 DIAGNOSIS — I5042 Chronic combined systolic (congestive) and diastolic (congestive) heart failure: Secondary | ICD-10-CM | POA: Diagnosis not present

## 2023-01-03 LAB — CUP PACEART REMOTE DEVICE CHECK
Battery Remaining Longevity: 90 mo
Battery Voltage: 3 V
Brady Statistic RV Percent Paced: 0.01 %
Date Time Interrogation Session: 20240819033622
HighPow Impedance: 74 Ohm
Implantable Lead Connection Status: 753985
Implantable Lead Implant Date: 20191030
Implantable Lead Location: 753860
Implantable Pulse Generator Implant Date: 20191030
Lead Channel Impedance Value: 361 Ohm
Lead Channel Impedance Value: 418 Ohm
Lead Channel Pacing Threshold Amplitude: 0.75 V
Lead Channel Pacing Threshold Pulse Width: 0.4 ms
Lead Channel Sensing Intrinsic Amplitude: 27.5 mV
Lead Channel Sensing Intrinsic Amplitude: 27.5 mV
Lead Channel Setting Pacing Amplitude: 2 V
Lead Channel Setting Pacing Pulse Width: 0.4 ms
Lead Channel Setting Sensing Sensitivity: 0.3 mV
Zone Setting Status: 755011
Zone Setting Status: 755011

## 2023-01-04 NOTE — Progress Notes (Signed)
EPIC Encounter for ICM Monitoring  Patient Name: Patricia Morgan is a 72 y.o. female Date: 01/04/2023 Primary Care Physican: Tally Joe, MD Primary Cardiologist: Croitoru Electrophysiologist: Croitoru Nephrologist: Dr Thomes Cake at [facility]  07/15/2022  Weight: 185 lbs 08/14/2022 Weight: 197 lbs 08/23/2022   Weight: 194 lbs 09/19/2022   Weight: 194 lbs 01/04/2023 Weight: 190 lbs     Spoke with patient and heart failure questions reviewed.  Transmission results reviewed.  Pt asymptomatic for fluid accumulation.  Reports feeling well at this time and voices no complaints.  She is going to the [facility] and working out 5 days a week.   Diet:  She is not strict on salt intake but does limit fluid intake to 64 oz daily.   Optivol Thoracic impedance suggesting normal fluid levels within the last month.    Prescribed:  Furosemide 80 mg take 1 tablet(s) (80 mg total) by mouth daily.    She takes extra Lasix 40 mg when needed.     Labs: 09/21/2022 Creatinine 2.88, BUN 43, Potassium 4.1, Sodium 140, GFR 17 A complete set of results can be found in Results Review.   Recommendations:    No changes and encouraged to call if experiencing any fluid symptoms.   Follow-up plan: ICM clinic phone appointment on 02/06/2023.   91 day device clinic remote transmission 04/03/2023.     EP/Cardiology Office Visits:   Recall 08/27/2023 with Dr. Royann Shivers.     Copy of ICM check sent to Dr. Royann Shivers.   3 month ICM trend: 01/03/2023.    12-14 Month ICM trend:     Karie Soda, RN 01/04/2023 4:14 PM

## 2023-01-12 NOTE — Progress Notes (Signed)
Remote ICD transmission.   

## 2023-01-13 DIAGNOSIS — N184 Chronic kidney disease, stage 4 (severe): Secondary | ICD-10-CM | POA: Diagnosis not present

## 2023-01-18 DIAGNOSIS — G4733 Obstructive sleep apnea (adult) (pediatric): Secondary | ICD-10-CM | POA: Diagnosis not present

## 2023-02-06 ENCOUNTER — Ambulatory Visit: Payer: 59 | Attending: Cardiovascular Disease

## 2023-02-06 DIAGNOSIS — I5042 Chronic combined systolic (congestive) and diastolic (congestive) heart failure: Secondary | ICD-10-CM | POA: Diagnosis not present

## 2023-02-06 DIAGNOSIS — Z95810 Presence of automatic (implantable) cardiac defibrillator: Secondary | ICD-10-CM | POA: Diagnosis not present

## 2023-02-07 ENCOUNTER — Other Ambulatory Visit: Payer: Self-pay | Admitting: Cardiovascular Disease

## 2023-02-07 NOTE — Progress Notes (Signed)
EPIC Encounter for ICM Monitoring  Patient Name: Patricia Morgan is a 72 y.o. female Date: 02/07/2023 Primary Care Physican: Tally Joe, MD Primary Cardiologist: Croitoru Electrophysiologist: Croitoru Nephrologist: Dr Thomes Cake at [facility]  07/15/2022  Weight: 185 lbs 08/14/2022 Weight: 197 lbs 08/23/2022   Weight: 194 lbs 09/19/2022   Weight: 194 lbs 01/04/2023 Weight: 190 lbs 02/07/2023 Weight: 192 lbs     Spoke with patient and heart failure questions reviewed.  Transmission results reviewed.  Pt reports feeling like she had fluid yesterday and take extra Lasix and feels like the fluid is gone.     Diet:  She is not strict on salt intake but does limit fluid intake to 64 oz daily.   Optivol Thoracic impedance suggesting possible fluid accumulation starting 9/4 and trending back to baseline.    Prescribed:  Furosemide 80 mg take 1 tablet(s) (80 mg total) by mouth daily.  She takes extra Lasix 40 mg when needed.     Labs: 09/21/2022 Creatinine 2.88, BUN 43, Potassium 4.1, Sodium 140, GFR 17 A complete set of results can be found in Results Review.   Recommendations:    No changes and encouraged to call if experiencing any fluid symptoms.   Follow-up plan: ICM clinic phone appointment on 03/13/2023.   91 day device clinic remote transmission 04/03/2023.     EP/Cardiology Office Visits:   Recall 08/27/2023 with Dr. Royann Shivers.     Copy of ICM check sent to Dr. Royann Shivers.   3 month ICM trend: 02/06/2023.    12-14 Month ICM trend:     Karie Soda, RN 02/07/2023 3:29 PM

## 2023-03-11 ENCOUNTER — Other Ambulatory Visit: Payer: Self-pay | Admitting: Cardiovascular Disease

## 2023-03-13 ENCOUNTER — Ambulatory Visit: Payer: 59 | Attending: Cardiovascular Disease

## 2023-03-13 DIAGNOSIS — I5042 Chronic combined systolic (congestive) and diastolic (congestive) heart failure: Secondary | ICD-10-CM | POA: Diagnosis not present

## 2023-03-13 DIAGNOSIS — Z95810 Presence of automatic (implantable) cardiac defibrillator: Secondary | ICD-10-CM

## 2023-03-17 NOTE — Progress Notes (Signed)
EPIC Encounter for ICM Monitoring  Patient Name: Patricia Morgan is a 72 y.o. female Date: 03/17/2023 Primary Care Physican: Tally Joe, MD Primary Cardiologist: Croitoru Electrophysiologist: Croitoru Nephrologist: Dr Thomes Cake at [facility]  07/15/2022  Weight: 185 lbs 08/14/2022 Weight: 197 lbs 08/23/2022   Weight: 194 lbs 09/19/2022   Weight: 194 lbs 01/04/2023 Weight: 190 lbs 02/07/2023 Weight: 192 lbs     Spoke with patient and heart failure questions reviewed.  Transmission results reviewed.  Pt reports she has been traveling a lot in the last month which may contribute to decreased impedance.   Diet:  She is not strict on salt intake but does limit fluid intake to 64 oz daily.   Optivol Thoracic impedance suggesting intermittent days with possible fluid accumulation within the last month.  .    Prescribed:  Furosemide 80 mg take 1 tablet(s) (80 mg total) by mouth daily.  She takes extra Lasix 40 mg when needed.     Labs: 09/21/2022 Creatinine 2.88, BUN 43, Potassium 4.1, Sodium 140, GFR 17 A complete set of results can be found in Results Review.   Recommendations:    No changes and encouraged to call if experiencing any fluid symptoms.   Follow-up plan: ICM clinic phone appointment on 04/17/2023.   91 day device clinic remote transmission 04/03/2023.     EP/Cardiology Office Visits:   Recall 08/27/2023 with Dr. Royann Shivers.     Copy of ICM check sent to Dr. Royann Shivers.   3 month ICM trend: 03/13/2023.    12-14 Month ICM trend:     Karie Soda, RN 03/17/2023 2:46 PM

## 2023-03-20 DIAGNOSIS — G4733 Obstructive sleep apnea (adult) (pediatric): Secondary | ICD-10-CM | POA: Diagnosis not present

## 2023-03-24 ENCOUNTER — Emergency Department (HOSPITAL_BASED_OUTPATIENT_CLINIC_OR_DEPARTMENT_OTHER)
Admission: EM | Admit: 2023-03-24 | Discharge: 2023-03-24 | Disposition: A | Discharge status: Home / Self Care | Payer: 59 | Attending: Emergency Medicine | Admitting: Emergency Medicine

## 2023-03-24 ENCOUNTER — Encounter (HOSPITAL_BASED_OUTPATIENT_CLINIC_OR_DEPARTMENT_OTHER): Payer: Self-pay

## 2023-03-24 ENCOUNTER — Emergency Department (HOSPITAL_BASED_OUTPATIENT_CLINIC_OR_DEPARTMENT_OTHER): Payer: 59

## 2023-03-24 ENCOUNTER — Other Ambulatory Visit: Payer: Self-pay

## 2023-03-24 DIAGNOSIS — I509 Heart failure, unspecified: Secondary | ICD-10-CM | POA: Diagnosis not present

## 2023-03-24 DIAGNOSIS — K5790 Diverticulosis of intestine, part unspecified, without perforation or abscess without bleeding: Secondary | ICD-10-CM | POA: Insufficient documentation

## 2023-03-24 DIAGNOSIS — R109 Unspecified abdominal pain: Secondary | ICD-10-CM | POA: Diagnosis present

## 2023-03-24 DIAGNOSIS — E1122 Type 2 diabetes mellitus with diabetic chronic kidney disease: Secondary | ICD-10-CM | POA: Diagnosis not present

## 2023-03-24 DIAGNOSIS — K429 Umbilical hernia without obstruction or gangrene: Secondary | ICD-10-CM | POA: Diagnosis not present

## 2023-03-24 DIAGNOSIS — Z853 Personal history of malignant neoplasm of breast: Secondary | ICD-10-CM | POA: Diagnosis not present

## 2023-03-24 DIAGNOSIS — N281 Cyst of kidney, acquired: Secondary | ICD-10-CM | POA: Diagnosis not present

## 2023-03-24 DIAGNOSIS — N189 Chronic kidney disease, unspecified: Secondary | ICD-10-CM | POA: Diagnosis not present

## 2023-03-24 DIAGNOSIS — K5792 Diverticulitis of intestine, part unspecified, without perforation or abscess without bleeding: Secondary | ICD-10-CM

## 2023-03-24 DIAGNOSIS — K5732 Diverticulitis of large intestine without perforation or abscess without bleeding: Secondary | ICD-10-CM | POA: Diagnosis not present

## 2023-03-24 LAB — COMPREHENSIVE METABOLIC PANEL
ALT: 13 U/L (ref 0–44)
AST: 18 U/L (ref 15–41)
Albumin: 3.9 g/dL (ref 3.5–5.0)
Alkaline Phosphatase: 89 U/L (ref 38–126)
Anion gap: 10 (ref 5–15)
BUN: 51 mg/dL — ABNORMAL HIGH (ref 8–23)
CO2: 24 mmol/L (ref 22–32)
Calcium: 9.7 mg/dL (ref 8.9–10.3)
Chloride: 103 mmol/L (ref 98–111)
Creatinine, Ser: 3.03 mg/dL — ABNORMAL HIGH (ref 0.44–1.00)
GFR, Estimated: 16 mL/min — ABNORMAL LOW (ref >60)
Glucose, Bld: 120 mg/dL — ABNORMAL HIGH (ref 70–99)
Potassium: 3.9 mmol/L (ref 3.5–5.1)
Sodium: 137 mmol/L (ref 135–145)
Total Bilirubin: 0.8 mg/dL (ref <1.2)
Total Protein: 7.6 g/dL (ref 6.5–8.1)

## 2023-03-24 LAB — LIPASE, BLOOD: Lipase: 40 U/L (ref 11–51)

## 2023-03-24 LAB — URINALYSIS, ROUTINE W REFLEX MICROSCOPIC
Bilirubin Urine: NEGATIVE
Glucose, UA: NEGATIVE mg/dL
Hgb urine dipstick: NEGATIVE
Ketones, ur: NEGATIVE mg/dL
Leukocytes,Ua: NEGATIVE
Nitrite: NEGATIVE
Protein, ur: 30 mg/dL — AB
Specific Gravity, Urine: 1.015 (ref 1.005–1.030)
pH: 5.5 (ref 5.0–8.0)

## 2023-03-24 LAB — CBC
HCT: 33.3 % — ABNORMAL LOW (ref 36.0–46.0)
Hemoglobin: 10.8 g/dL — ABNORMAL LOW (ref 12.0–15.0)
MCH: 31.1 pg (ref 26.0–34.0)
MCHC: 32.4 g/dL (ref 30.0–36.0)
MCV: 96 fL (ref 80.0–100.0)
Platelets: 188 10*3/uL (ref 150–400)
RBC: 3.47 MIL/uL — ABNORMAL LOW (ref 3.87–5.11)
RDW: 14 % (ref 11.5–15.5)
WBC: 5.9 10*3/uL (ref 4.0–10.5)
nRBC: 0 % (ref 0.0–0.2)

## 2023-03-24 LAB — URINALYSIS, MICROSCOPIC (REFLEX): RBC / HPF: NONE SEEN RBC/hpf (ref 0–5)

## 2023-03-24 MED ORDER — AMOXICILLIN-POT CLAVULANATE 875-125 MG PO TABS: 1.0000 | ORAL_TABLET | Freq: Once | ORAL | Status: DC

## 2023-03-24 MED ORDER — MORPHINE SULFATE (PF) 4 MG/ML IV SOLN
4.0000 mg | Freq: Once | INTRAVENOUS | Status: AC
  Administered 2023-03-24: 4 mg via INTRAVENOUS
  Filled 2023-03-24: qty 1

## 2023-03-24 MED ORDER — AMOXICILLIN-POT CLAVULANATE 500-125 MG PO TABS: 1.0000 | ORAL_TABLET | Freq: Two times a day (BID) | ORAL | 0 refills | Status: DC

## 2023-03-24 MED ORDER — OXYCODONE HCL 5 MG PO TABS: 5.0000 mg | ORAL_TABLET | Freq: Four times a day (QID) | ORAL | 0 refills | Status: DC | PRN

## 2023-03-24 MED ORDER — AMOXICILLIN-POT CLAVULANATE 500-125 MG PO TABS: 1.0000 | ORAL_TABLET | Freq: Once | ORAL | Status: DC

## 2023-03-24 NOTE — Discharge Instructions (Addendum)
Please read and follow all provided instructions.  Your diagnoses today include:  1. Diverticulitis     Tests performed today include: Blood cell counts and platelets: Your red blood cell count was normal Kidney and liver function tests: Your creatinine was 3.03 today Pancreas function test (called lipase) Urine test to look for infection: No signs of infection Your CT scan showed diverticulitis without complications, you also have some kidney cysts Vital signs. See below for your results today.   Medications prescribed:  Augmentin - antibiotic  You have been prescribed an antibiotic medicine: take the entire course of medicine even if you are feeling better. Stopping early can cause the antibiotic not to work.  Oxycodone - narcotic pain medication  DO NOT drive or perform any activities that require you to be awake and alert because this medicine can make you drowsy.   Take any prescribed medications only as directed.  Home care instructions:  Follow any educational materials contained in this packet.  Follow-up instructions: Please follow-up with your primary care provider in the next 3 days for further evaluation of your symptoms.    Return instructions:  SEEK IMMEDIATE MEDICAL ATTENTION IF: The pain does not go away or becomes severe  A temperature above 101F develops  Repeated vomiting occurs (multiple episodes)  The pain becomes localized to portions of the abdomen. The right side could possibly be appendicitis. In an adult, the left lower portion of the abdomen could be colitis or diverticulitis.  Blood is being passed in stools or vomit (bright red or black tarry stools)  You develop chest pain, difficulty breathing, dizziness or fainting, or become confused, poorly responsive, or inconsolable (young children) If you have any other emergent concerns regarding your health  Additional Information: Abdominal (belly) pain can be caused by many things. Your caregiver  performed an examination and possibly ordered blood/urine tests and imaging (CT scan, x-rays, ultrasound). Many cases can be observed and treated at home after initial evaluation in the emergency department. Even though you are being discharged home, abdominal pain can be unpredictable. Therefore, you need a repeated exam if your pain does not resolve, returns, or worsens. Most patients with abdominal pain don't have to be admitted to the hospital or have surgery, but serious problems like appendicitis and gallbladder attacks can start out as nonspecific pain. Many abdominal conditions cannot be diagnosed in one visit, so follow-up evaluations are very important.  Your vital signs today were: BP (!) 165/78   Pulse 75   Temp 98.6 F (37 C) (Oral)   Resp 18   Ht 5\' 2"  (1.575 m)   Wt 90 kg   SpO2 90%   BMI 36.29 kg/m  If your blood pressure (bp) was elevated above 135/85 this visit, please have this repeated by your doctor within one month. --------------

## 2023-03-24 NOTE — ED Triage Notes (Addendum)
The patient is having abd pain since Monday with nausea. She is having pain when she needs to urinate.

## 2023-03-24 NOTE — ED Provider Notes (Signed)
[place] [facility] DEPARTMENT AT [facility] Provider Note   CSN: [id number] Arrival date & time: 03/24/23  1033     History  Chief Complaint  Patient presents with   Abdominal Pain    Patricia Morgan is a 72 y.o. female.  Patient with history of diabetes, chronic kidney disease, history of DVT, history of CHF, ICD in place, history of breast cancer --presents to the emergency department today for evaluation of abdominal pain.  Patient states that her pain started about 4 days ago.  She has associated nausea without vomiting.  No diarrhea.  Abdominal pain is bilateral, upper abdomen.  No diarrhea or constipation.  Patient states that she had similar pain a couple of months ago, was treated for UTI and symptoms seem to get better, but was later called and told that she did not have a UTI.  Not much improvement with over-the-counter medications.  She denies any changes with eating or drinking.  She states that she has intense pain when she feels like she has to go to the restroom, but no pain with voiding itself.       Home Medications Prior to Admission medications   Medication Sig Start Date End Date Taking? Authorizing Provider  acetaminophen (TYLENOL) 500 MG tablet Take 500 mg by mouth every 6 (six) hours as needed for mild pain. Patient not taking: Reported on 09/01/2022    [provider]  allopurinol (ZYLOPRIM) 100 MG tablet Take 100 mg by mouth 2 (two) times daily.     [provider]  Blood Glucose Monitoring Suppl (TRUE METRIX AIR GLUCOSE METER) w/Device KIT  11/21/16   [provider]  carvedilol (COREG) 25 MG tablet TAKE 1 TABLET BY MOUTH TWICE  DAILY 02/08/23   Croitoru, Mihai, MD  Cholecalciferol (VITAMIN D-3) 5000 units TABS Take 5,000 Units by mouth daily.     [provider]  cholecalciferol (VITAMIN D3) 25 MCG (1000 UNIT) tablet Take 1,000 Units by mouth daily. 02/11/22   [provider]  diphenhydrAMINE (BENADRYL)  25 MG tablet Take 25 mg by mouth daily.    [provider]  ELIQUIS 5 MG TABS tablet TAKE 1 TABLET TWICE DAILY 05/09/19   Josph Macho, MD  ferrous sulfate 325 (65 FE) MG tablet Take 325 mg by mouth daily with breakfast.    [provider]  Fluocinolone Acetonide 0.01 % OIL Place 5 drops in ear(s) daily. 06/15/21   Theadora Rama Scales, PA-C  furosemide (LASIX) 80 MG tablet Take 80 mg by mouth daily. 05/23/22   [provider]  hydrALAZINE (APRESOLINE) 10 MG tablet TAKE 1 TABLET BY MOUTH 3 TIMES  DAILY 06/27/22   Croitoru, Rachelle Hora, MD  isosorbide mononitrate (IMDUR) 30 MG 24 hr tablet TAKE 1 TABLET BY MOUTH DAILY 03/13/23   Croitoru, Mihai, MD  rosuvastatin (CRESTOR) 10 MG tablet TAKE 1 TABLET BY MOUTH DAILY 08/05/22   Croitoru, Mihai, MD  vitamin B-12 (CYANOCOBALAMIN) 1000 MCG tablet Take 1,000 mcg by mouth daily.    [provider]  vitamin E 400 UNIT capsule Take 400 Units by mouth daily.    [provider]  fluticasone (FLONASE) 50 MCG/ACT nasal spray Place 2 sprays into the nose daily. 12/16/10 08/12/19  Newt Lukes, MD      Allergies    Patient has no known allergies.    Review of Systems   Review of Systems  Physical Exam Updated Vital Signs BP (!) 145/64   Pulse 71  Temp 98.7 F (37.1 C) (Oral)   Resp 18   Ht 5\' 2"  (1.575 m)   Wt 90 kg   SpO2 97%   BMI 36.29 kg/m  Physical Exam Vitals and nursing note reviewed.  Constitutional:      General: She is not in acute distress.    Appearance: She is well-developed.  HENT:     Head: Normocephalic and atraumatic.     Right Ear: External ear normal.     Left Ear: External ear normal.     Nose: Nose normal.  Eyes:     Conjunctiva/sclera: Conjunctivae normal.  Cardiovascular:     Rate and Rhythm: Normal rate and regular rhythm.     Heart sounds: No murmur heard. Pulmonary:     Effort: No respiratory distress.     Breath sounds: No wheezing, rhonchi or rales.  Abdominal:      Palpations: Abdomen is soft.     Tenderness: There is abdominal tenderness (Moderate) in the right upper quadrant, epigastric area, periumbilical area and left upper quadrant. There is no guarding or rebound. Negative signs include Murphy's sign and McBurney's sign.  Musculoskeletal:     Cervical back: Normal range of motion and neck supple.     Right lower leg: No edema.     Left lower leg: No edema.  Skin:    General: Skin is warm and dry.     Findings: No rash.  Neurological:     General: No focal deficit present.     Mental Status: She is alert. Mental status is at baseline.     Motor: No weakness.  Psychiatric:        Mood and Affect: Mood normal.     ED Results / Procedures / Treatments   Labs (all labs ordered are listed, but only abnormal results are displayed) Labs Reviewed  COMPREHENSIVE METABOLIC PANEL - Abnormal; Notable for the following components:      Result Value   Glucose, Bld 120 (*)    BUN 51 (*)    Creatinine, Ser 3.03 (*)    GFR, Estimated 16 (*)    All other components within normal limits  CBC - Abnormal; Notable for the following components:   RBC 3.47 (*)    Hemoglobin 10.8 (*)    HCT 33.3 (*)    All other components within normal limits  URINALYSIS, ROUTINE W REFLEX MICROSCOPIC - Abnormal; Notable for the following components:   Protein, ur 30 (*)    All other components within normal limits  URINALYSIS, MICROSCOPIC (REFLEX) - Abnormal; Notable for the following components:   Bacteria, UA RARE (*)    All other components within normal limits  LIPASE, BLOOD    EKG None  Radiology CT ABDOMEN PELVIS WO CONTRAST  Result Date: 03/24/2023 CLINICAL DATA:  Abdominal pain and nausea since Monday. Pain with urination. EXAM: CT ABDOMEN AND PELVIS WITHOUT CONTRAST TECHNIQUE: Multidetector CT imaging of the abdomen and pelvis was performed following the standard protocol without IV contrast. RADIATION DOSE REDUCTION: This exam was performed according  to the departmental dose-optimization program which includes automated exposure control, adjustment of the mA and/or kV according to patient size and/or use of iterative reconstruction technique. COMPARISON:  MRI abdomen 03/22/2022. FINDINGS: Lower chest: Heart is enlarged. Defibrillator leads along the right side of the heart. Mild linear opacity at the lung bases with some ground-glass, nonspecific. No pleural effusion. Breathing motion seen at the bases. Previous changes of right mastectomy. Hepatobiliary: On this non  IV contrast exam, grossly preserved hepatic parenchyma. Gallbladder is nondilated. Pancreas: Unremarkable. No pancreatic ductal dilatation or surrounding inflammatory changes. Spleen: Normal in size without focal abnormality. Adrenals/Urinary Tract: Adrenal glands are preserved. Malrotated left kidney with some atrophy. No abnormal calcifications are seen within either kidney nor along the course of either ureter. Once again there are numerous bilateral renal cysts. Largest is seen on the right measuring 5.8 cm today with Hounsfield unit of 8. Dominant focus lower pole on the left has a diameter of 4.1 cm Hounsfield units of 12. Small hyperdense area along the lower pole of the right kidney on series 301 image 41 measuring 8 mm. Hounsfield unit of 54. Favor these being Bosniak 1 and 2 renal lesions as described on the previous MRI. Preserved contours of the urinary bladder. Stomach/Bowel: On this non oral contrast exam along the sigmoid colon there is inflammatory stranding with diverticula and wall thickening consistent with the areas of subtle diverticulitis. No complicating features of a loculated fluid collection, free air or obstruction. Few scattered colonic diverticula identified along the colon. Normal retrocecal appendix. The stomach and small bowel are nondilated. Vascular/Lymphatic: Aortic atherosclerosis. No enlarged abdominal or pelvic lymph nodes. Reproductive: Lobular uterus with  calcifications consistent with fibroids. No separate adnexal mass Other: Trace free fluid in the pelvis. No free air. Small fat containing umbilical hernia. Musculoskeletal: Scattered degenerative changes along the spine. Degenerative changes along the pelvis. IMPRESSION: Focal area of diverticulitis involving the mid sigmoid colon with stranding, thickening and diverticula. No complicating features at this time. Recommend follow up after treatment to confirm resolution and exclude secondary pathology. No obstructing renal stone. Known history of Bosniak 1 and 2 renal cystic lesions. Enlarged heart with a defibrillator. Electronically Signed   By: Karen Kays M.D.   On: 03/24/2023 17:06    Procedures Procedures    Medications Ordered in ED Medications  morphine (PF) 4 MG/ML injection 4 mg (4 mg Intravenous Given 03/24/23 1349)    ED Course/ Medical Decision Making/ A&P    Patient seen and examined. History obtained directly from patient. Work-up including labs, imaging, EKG ordered in triage, if performed, were reviewed.    Labs/EKG: Independently reviewed and interpreted.  This included: CMP with hemoglobin 10.8, white blood cell count normal at 5.9; CMP shows normal electrolytes, glucose high at 120, creatinine elevated at 3.03 with a BUN of 51 which is near her recent baseline; lipase normal; UA without compelling signs of infection.  Imaging: Ordered CT abdomen without contrast due to CKD  Medications/Fluids: Ordered: IV morphine, initially patient states that she drove here, but is able to find a ride and would like something for the pain.  Most recent vital signs reviewed and are as follows: BP (!) 145/64   Pulse 71   Temp 98.7 F (37.1 C) (Oral)   Resp 18   Ht 5\' 2"  (1.575 m)   Wt 90 kg   SpO2 97%   BMI 36.29 kg/m   Initial impression: Bilateral abdominal tenderness and pain for several days  5:27 PM Reassessment performed. Patient appears comfortable and stable on several  rechecks.  She states that the pain medicine helped her be more comfortable.  We discussed her elevated creatinine.    Labs personally reviewed and interpreted including: Review of records show creatinine of 2.79 on 04/13/2022.  Imaging personally visualized and interpreted including: CT abdomen pelvis, agree findings consistent with diverticulitis, renal cysts noted as well.  Reviewed pertinent lab work and imaging with  patient at bedside. Questions answered.   Most current vital signs reviewed and are as follows: BP (!) 165/78   Pulse 75   Temp 98.6 F (37 C) (Oral)   Resp 18   Ht 5\' 2"  (1.575 m)   Wt 90 kg   SpO2 90%   BMI 36.29 kg/m   Plan: Discharge to home.   Prescriptions written for: Augmentin, renal dosing, oxycodone # 6 tablets.  Patient counseled on use of narcotic pain medications. Counseled not to combine these medications with others containing tylenol. Urged not to drink alcohol, drive, or perform any other activities that requires focus while taking these medications. The patient verbalizes understanding and agrees with the plan.  Other home care instructions discussed: Rest, bland diet, maintain good hydration  ED return instructions discussed: The patient was urged to return to the Emergency Department immediately with worsening of current symptoms, worsening abdominal pain, persistent vomiting, blood noted in stools, fever, or any other concerns. The patient verbalized understanding.   Follow-up instructions discussed: Patient encouraged to follow-up with their PCP in 3 days.                                 Medical Decision Making Amount and/or Complexity of Data Reviewed Labs: ordered. Radiology: ordered.  Risk Prescription drug management.   For this patient's complaint of abdominal pain, the following conditions were considered on the differential diagnosis: gastritis/PUD, enteritis/duodenitis, appendicitis, cholelithiasis/cholecystitis, cholangitis,  pancreatitis, ruptured viscus, colitis, diverticulitis, small/large bowel obstruction, proctitis, cystitis, pyelonephritis, ureteral colic, aortic dissection, aortic aneurysm. In women, ectopic pregnancy, pelvic inflammatory disease, ovarian cysts, and tubo-ovarian abscess were also considered. Atypical chest etiologies were also considered including ACS, PE, and pneumonia.  CT imaging consistent with diverticulitis which is consistent with patient's symptoms.  Patient has elevated creatinine, near baseline.  No IV contrast today.  Renal dosing for Augmentin prescribed.  The patient's vital signs, pertinent lab work and imaging were reviewed and interpreted as discussed in the ED course. Hospitalization was considered for further testing, treatments, or serial exams/observation. However as patient is well-appearing, has a stable exam, and reassuring studies today, I do not feel that they warrant admission at this time. This plan was discussed with the patient who verbalizes agreement and comfort with this plan and seems reliable and able to return to the Emergency Department with worsening or changing symptoms.          Final Clinical Impression(s) / ED Diagnoses Final diagnoses:  Diverticulitis    Rx / DC Orders ED Discharge Orders          Ordered    amoxicillin-clavulanate (AUGMENTIN) 500-125 MG tablet  Every 12 hours        03/24/23 1723    oxyCODONE (OXY IR/ROXICODONE) 5 MG immediate release tablet  Every 6 hours PRN        03/24/23 1723              Renne Crigler, PA-C 03/24/23 1730    Lonell Grandchild, MD 04/04/23 0022

## 2023-03-24 NOTE — ED Notes (Signed)
..  The patient is A&OX4, ambulatory at d/c with independent steady gait, NAD. Pt verbalized understanding of d/c instructions, prescriptions and follow up care.  

## 2023-03-25 DIAGNOSIS — K5792 Diverticulitis of intestine, part unspecified, without perforation or abscess without bleeding: Secondary | ICD-10-CM | POA: Diagnosis not present

## 2023-04-03 ENCOUNTER — Ambulatory Visit (INDEPENDENT_AMBULATORY_CARE_PROVIDER_SITE_OTHER): Payer: 59

## 2023-04-03 DIAGNOSIS — I428 Other cardiomyopathies: Secondary | ICD-10-CM | POA: Diagnosis not present

## 2023-04-04 LAB — CUP PACEART REMOTE DEVICE CHECK
Battery Remaining Longevity: 83 mo
Battery Voltage: 3 V
Brady Statistic RV Percent Paced: 0.01 %
Date Time Interrogation Session: 20241118093426
HighPow Impedance: 81 Ohm
Implantable Lead Connection Status: 753985
Implantable Lead Implant Date: 20191030
Implantable Lead Location: 753860
Implantable Pulse Generator Implant Date: 20191030
Lead Channel Impedance Value: 342 Ohm
Lead Channel Impedance Value: 399 Ohm
Lead Channel Pacing Threshold Amplitude: 0.75 V
Lead Channel Pacing Threshold Pulse Width: 0.4 ms
Lead Channel Sensing Intrinsic Amplitude: 23.5 mV
Lead Channel Sensing Intrinsic Amplitude: 23.5 mV
Lead Channel Setting Pacing Amplitude: 2 V
Lead Channel Setting Pacing Pulse Width: 0.4 ms
Lead Channel Setting Sensing Sensitivity: 0.3 mV
Zone Setting Status: 755011
Zone Setting Status: 755011

## 2023-04-17 ENCOUNTER — Ambulatory Visit: Payer: 59 | Attending: Cardiovascular Disease

## 2023-04-17 DIAGNOSIS — I5042 Chronic combined systolic (congestive) and diastolic (congestive) heart failure: Secondary | ICD-10-CM | POA: Diagnosis not present

## 2023-04-17 DIAGNOSIS — Z95810 Presence of automatic (implantable) cardiac defibrillator: Secondary | ICD-10-CM | POA: Diagnosis not present

## 2023-04-18 ENCOUNTER — Other Ambulatory Visit: Payer: Self-pay | Admitting: Cardiovascular Disease

## 2023-04-21 NOTE — Progress Notes (Signed)
EPIC Encounter for ICM Monitoring  Patient Name: Patricia Morgan is a 72 y.o. female Date: 04/21/2023 Primary Care Physican: Tally Joe, MD Primary Cardiologist: Croitoru Electrophysiologist: Croitoru Nephrologist: Dr Thomes Cake at [facility]  07/15/2022  Weight: 185 lbs 08/14/2022 Weight: 197 lbs 08/23/2022   Weight: 194 lbs 09/19/2022   Weight: 194 lbs 01/04/2023 Weight: 190 lbs 02/07/2023 Weight: 192 lbs     Spoke with patient and heart failure questions reviewed.  Transmission results reviewed.  She had ED visit 11/8 ED with new dx of diverticulitis.  She denies any fluid accumulation symptoms.   Diet:  She is not strict on salt intake but does limit fluid intake to 64 oz daily.   Optivol Thoracic impedance suggesting intermittent days with minimal possible fluid accumulation within the last month.      Prescribed:  Furosemide 80 mg take 1 tablet(s) (80 mg total) by mouth daily.  She takes extra Lasix 40 mg when needed.     Labs: 03/23/2024 Creatinine 3.03, BUN 51, Potassium 3.9, Sodium 137, GFR 16  01/13/2024 Creatinine 3.36, BUN 57, Potassium 4.3, Sodium 139, GFR 14  11/18/2023 Creatinine 3.31, BUN 44, Potassium 4.2, Sodium 140, GFR 14  11/01/2023 Creatinine 3.59, BUN 59, Potassium 4.4, Sodium 141, GFR 13 09/21/2022 Creatinine 2.88, BUN 43, Potassium 4.1, Sodium 140, GFR 17 A complete set of results can be found in Results Review.   Recommendations:    No changes and encouraged to call if experiencing any fluid symptoms.   Follow-up plan: ICM clinic phone appointment on 05/22/2023   91 day device clinic remote transmission 07/03/2023.     EP/Cardiology Office Visits:   Recall 08/27/2023 with Dr. Royann Shivers.     Copy of ICM check sent to Dr. Royann Shivers.   3 month ICM trend: 04/17/2023.    12-14 Month ICM trend:     Karie Soda, RN 04/21/2023 3:39 PM

## 2023-04-27 NOTE — Addendum Note (Signed)
Addended by: Geralyn Flash D on: 04/27/2023 12:02 PM   Modules accepted: Orders

## 2023-04-27 NOTE — Progress Notes (Signed)
Remote ICD transmission.   

## 2023-05-01 DIAGNOSIS — N184 Chronic kidney disease, stage 4 (severe): Secondary | ICD-10-CM | POA: Diagnosis not present

## 2023-05-20 ENCOUNTER — Other Ambulatory Visit: Payer: Self-pay | Admitting: Cardiovascular Disease

## 2023-05-20 DIAGNOSIS — G4733 Obstructive sleep apnea (adult) (pediatric): Secondary | ICD-10-CM | POA: Diagnosis not present

## 2023-05-22 ENCOUNTER — Ambulatory Visit: Payer: 59 | Attending: Cardiovascular Disease

## 2023-05-22 DIAGNOSIS — I5042 Chronic combined systolic (congestive) and diastolic (congestive) heart failure: Secondary | ICD-10-CM

## 2023-05-22 DIAGNOSIS — Z95810 Presence of automatic (implantable) cardiac defibrillator: Secondary | ICD-10-CM | POA: Diagnosis not present

## 2023-05-25 NOTE — Progress Notes (Signed)
 EPIC Encounter for ICM Monitoring  Patient Name: Patricia Morgan is a 73 y.o. female Date: 05/25/2023 Primary Care Physican: Patricia Lenis, MD Primary Cardiologist: Patricia Morgan Electrophysiologist: Patricia Morgan Nephrologist: Patricia Morgan at [facility]  07/15/2022  Weight: 185 lbs 08/14/2022 Weight: 197 lbs 08/23/2022   Weight: 194 lbs 09/19/2022   Weight: 194 lbs 01/04/2023 Weight: 190 lbs 02/07/2023 Weight: 192 lbs    Attempted call to patient and unable to reach.  Left detailed message per DPR regarding transmission.  Transmission results reviewed.    Diet:  She is not strict on salt intake but does limit fluid intake to 64 oz daily.   Optivol Thoracic impedance suggesting possible fluid accumulation starting 12/1 and returned close to baseline 1/6.      Prescribed:  Furosemide  80 mg take 1 tablet(s) (80 mg total) by mouth daily.  She takes extra Lasix  40 mg when needed.     Labs: 03/23/2024 Creatinine 3.03, BUN 51, Potassium 3.9, Sodium 137, GFR 16  01/13/2024 Creatinine 3.36, BUN 57, Potassium 4.3, Sodium 139, GFR 14  11/18/2023 Creatinine 3.31, BUN 44, Potassium 4.2, Sodium 140, GFR 14  11/01/2023 Creatinine 3.59, BUN 59, Potassium 4.4, Sodium 141, GFR 13 09/21/2022 Creatinine 2.88, BUN 43, Potassium 4.1, Sodium 140, GFR 17 A complete set of results can be found in Results Review.   Recommendations:    Left voice mail with ICM number and encouraged to call if experiencing any fluid symptoms.   Follow-up plan: ICM clinic phone appointment on 06/06/2023 (manual) to recheck fluid levels.   91 day device clinic remote transmission 07/03/2023.     EP/Cardiology Office Visits:   Recall 08/27/2023 with Patricia. Francyne.     Copy of ICM check sent to Patricia. Francyne.   3 month ICM trend: 05/22/2023.    12-14 Month ICM trend:     Patricia GORMAN Garner, RN 05/25/2023 2:45 PM

## 2023-05-29 ENCOUNTER — Emergency Department (HOSPITAL_COMMUNITY): Payer: 59

## 2023-05-29 ENCOUNTER — Inpatient Hospital Stay (HOSPITAL_COMMUNITY)
Admission: EM | Admit: 2023-05-29 | Discharge: 2023-06-01 | DRG: 871 | Disposition: A | Discharge status: Home / Self Care | Payer: 59 | Attending: Internal Medicine | Admitting: Internal Medicine

## 2023-05-29 ENCOUNTER — Other Ambulatory Visit: Payer: Self-pay

## 2023-05-29 DIAGNOSIS — E119 Type 2 diabetes mellitus without complications: Secondary | ICD-10-CM

## 2023-05-29 DIAGNOSIS — I6523 Occlusion and stenosis of bilateral carotid arteries: Secondary | ICD-10-CM | POA: Diagnosis not present

## 2023-05-29 DIAGNOSIS — G9341 Metabolic encephalopathy: Secondary | ICD-10-CM | POA: Diagnosis present

## 2023-05-29 DIAGNOSIS — Z86711 Personal history of pulmonary embolism: Secondary | ICD-10-CM | POA: Diagnosis not present

## 2023-05-29 DIAGNOSIS — E785 Hyperlipidemia, unspecified: Secondary | ICD-10-CM | POA: Diagnosis present

## 2023-05-29 DIAGNOSIS — K5730 Diverticulosis of large intestine without perforation or abscess without bleeding: Secondary | ICD-10-CM | POA: Diagnosis not present

## 2023-05-29 DIAGNOSIS — J1008 Influenza due to other identified influenza virus with other specified pneumonia: Secondary | ICD-10-CM | POA: Diagnosis present

## 2023-05-29 DIAGNOSIS — I10 Essential (primary) hypertension: Secondary | ICD-10-CM | POA: Diagnosis present

## 2023-05-29 DIAGNOSIS — Z9011 Acquired absence of right breast and nipple: Secondary | ICD-10-CM

## 2023-05-29 DIAGNOSIS — Z860100 Personal history of colon polyps, unspecified: Secondary | ICD-10-CM

## 2023-05-29 DIAGNOSIS — J690 Pneumonitis due to inhalation of food and vomit: Secondary | ICD-10-CM | POA: Diagnosis not present

## 2023-05-29 DIAGNOSIS — I5022 Chronic systolic (congestive) heart failure: Secondary | ICD-10-CM | POA: Diagnosis not present

## 2023-05-29 DIAGNOSIS — J849 Interstitial pulmonary disease, unspecified: Secondary | ICD-10-CM | POA: Diagnosis not present

## 2023-05-29 DIAGNOSIS — Z9221 Personal history of antineoplastic chemotherapy: Secondary | ICD-10-CM | POA: Diagnosis not present

## 2023-05-29 DIAGNOSIS — Z87440 Personal history of urinary (tract) infections: Secondary | ICD-10-CM

## 2023-05-29 DIAGNOSIS — J9601 Acute respiratory failure with hypoxia: Secondary | ICD-10-CM | POA: Diagnosis present

## 2023-05-29 DIAGNOSIS — A419 Sepsis, unspecified organism: Secondary | ICD-10-CM | POA: Diagnosis not present

## 2023-05-29 DIAGNOSIS — Z86718 Personal history of other venous thrombosis and embolism: Secondary | ICD-10-CM | POA: Diagnosis not present

## 2023-05-29 DIAGNOSIS — Z79899 Other long term (current) drug therapy: Secondary | ICD-10-CM

## 2023-05-29 DIAGNOSIS — G4733 Obstructive sleep apnea (adult) (pediatric): Secondary | ICD-10-CM | POA: Diagnosis not present

## 2023-05-29 DIAGNOSIS — R509 Fever, unspecified: Secondary | ICD-10-CM | POA: Diagnosis not present

## 2023-05-29 DIAGNOSIS — I517 Cardiomegaly: Secondary | ICD-10-CM | POA: Diagnosis not present

## 2023-05-29 DIAGNOSIS — R0989 Other specified symptoms and signs involving the circulatory and respiratory systems: Secondary | ICD-10-CM | POA: Diagnosis not present

## 2023-05-29 DIAGNOSIS — N184 Chronic kidney disease, stage 4 (severe): Secondary | ICD-10-CM | POA: Diagnosis present

## 2023-05-29 DIAGNOSIS — R404 Transient alteration of awareness: Secondary | ICD-10-CM | POA: Diagnosis not present

## 2023-05-29 DIAGNOSIS — Z833 Family history of diabetes mellitus: Secondary | ICD-10-CM

## 2023-05-29 DIAGNOSIS — I82439 Acute embolism and thrombosis of unspecified popliteal vein: Secondary | ICD-10-CM | POA: Diagnosis present

## 2023-05-29 DIAGNOSIS — K429 Umbilical hernia without obstruction or gangrene: Secondary | ICD-10-CM | POA: Diagnosis not present

## 2023-05-29 DIAGNOSIS — J09X1 Influenza due to identified novel influenza A virus with pneumonia: Secondary | ICD-10-CM | POA: Diagnosis not present

## 2023-05-29 DIAGNOSIS — J14 Pneumonia due to Hemophilus influenzae: Secondary | ICD-10-CM | POA: Diagnosis present

## 2023-05-29 DIAGNOSIS — I428 Other cardiomyopathies: Secondary | ICD-10-CM | POA: Diagnosis not present

## 2023-05-29 DIAGNOSIS — J101 Influenza due to other identified influenza virus with other respiratory manifestations: Secondary | ICD-10-CM

## 2023-05-29 DIAGNOSIS — I5021 Acute systolic (congestive) heart failure: Secondary | ICD-10-CM

## 2023-05-29 DIAGNOSIS — Z1152 Encounter for screening for COVID-19: Secondary | ICD-10-CM | POA: Diagnosis not present

## 2023-05-29 DIAGNOSIS — I700 Atherosclerosis of aorta: Secondary | ICD-10-CM | POA: Diagnosis not present

## 2023-05-29 DIAGNOSIS — M109 Gout, unspecified: Secondary | ICD-10-CM | POA: Diagnosis present

## 2023-05-29 DIAGNOSIS — D6851 Activated protein C resistance: Secondary | ICD-10-CM | POA: Diagnosis not present

## 2023-05-29 DIAGNOSIS — E1122 Type 2 diabetes mellitus with diabetic chronic kidney disease: Secondary | ICD-10-CM | POA: Diagnosis present

## 2023-05-29 DIAGNOSIS — A4189 Other specified sepsis: Principal | ICD-10-CM | POA: Diagnosis present

## 2023-05-29 DIAGNOSIS — I130 Hypertensive heart and chronic kidney disease with heart failure and stage 1 through stage 4 chronic kidney disease, or unspecified chronic kidney disease: Secondary | ICD-10-CM | POA: Diagnosis present

## 2023-05-29 DIAGNOSIS — Z853 Personal history of malignant neoplasm of breast: Secondary | ICD-10-CM

## 2023-05-29 DIAGNOSIS — Z6839 Body mass index (BMI) 39.0-39.9, adult: Secondary | ICD-10-CM

## 2023-05-29 DIAGNOSIS — R4182 Altered mental status, unspecified: Principal | ICD-10-CM

## 2023-05-29 DIAGNOSIS — Z7901 Long term (current) use of anticoagulants: Secondary | ICD-10-CM

## 2023-05-29 DIAGNOSIS — Z95810 Presence of automatic (implantable) cardiac defibrillator: Secondary | ICD-10-CM

## 2023-05-29 DIAGNOSIS — N281 Cyst of kidney, acquired: Secondary | ICD-10-CM | POA: Diagnosis not present

## 2023-05-29 DIAGNOSIS — J189 Pneumonia, unspecified organism: Secondary | ICD-10-CM

## 2023-05-29 DIAGNOSIS — Z825 Family history of asthma and other chronic lower respiratory diseases: Secondary | ICD-10-CM

## 2023-05-29 LAB — CBC WITH DIFFERENTIAL/PLATELET
Abs Immature Granulocytes: 0.02 10*3/uL (ref 0.00–0.07)
Basophils Absolute: 0 10*3/uL (ref 0.0–0.1)
Basophils Relative: 0 %
Eosinophils Absolute: 0 10*3/uL (ref 0.0–0.5)
Eosinophils Relative: 0 %
HCT: 37.7 % (ref 36.0–46.0)
Hemoglobin: 12.1 g/dL (ref 12.0–15.0)
Immature Granulocytes: 0 %
Lymphocytes Relative: 15 %
Lymphs Abs: 1 10*3/uL (ref 0.7–4.0)
MCH: 30.7 pg (ref 26.0–34.0)
MCHC: 32.1 g/dL (ref 30.0–36.0)
MCV: 95.7 fL (ref 80.0–100.0)
Monocytes Absolute: 0.6 10*3/uL (ref 0.1–1.0)
Monocytes Relative: 8 %
Neutro Abs: 5.2 10*3/uL (ref 1.7–7.7)
Neutrophils Relative %: 77 %
Platelets: 150 10*3/uL (ref 150–400)
RBC: 3.94 MIL/uL (ref 3.87–5.11)
RDW: 14.6 % (ref 11.5–15.5)
WBC: 6.7 10*3/uL (ref 4.0–10.5)
nRBC: 0 % (ref 0.0–0.2)

## 2023-05-29 LAB — COMPREHENSIVE METABOLIC PANEL
ALT: 15 U/L (ref 0–44)
AST: 30 U/L (ref 15–41)
Albumin: 3.9 g/dL (ref 3.5–5.0)
Alkaline Phosphatase: 104 U/L (ref 38–126)
Anion gap: 14 (ref 5–15)
BUN: 44 mg/dL — ABNORMAL HIGH (ref 8–23)
CO2: 21 mmol/L — ABNORMAL LOW (ref 22–32)
Calcium: 10.1 mg/dL (ref 8.9–10.3)
Chloride: 101 mmol/L (ref 98–111)
Creatinine, Ser: 3.14 mg/dL — ABNORMAL HIGH (ref 0.44–1.00)
GFR, Estimated: 15 mL/min — ABNORMAL LOW (ref >60)
Glucose, Bld: 136 mg/dL — ABNORMAL HIGH (ref 70–99)
Potassium: 4.3 mmol/L (ref 3.5–5.1)
Sodium: 136 mmol/L (ref 135–145)
Total Bilirubin: 1.1 mg/dL (ref 0.0–1.2)
Total Protein: 7.9 g/dL (ref 6.5–8.1)

## 2023-05-29 LAB — PROTIME-INR
INR: 1.5 — ABNORMAL HIGH (ref 0.8–1.2)
Prothrombin Time: 17.9 s — ABNORMAL HIGH (ref 11.4–15.2)

## 2023-05-29 LAB — I-STAT CG4 LACTIC ACID, ED: Lactic Acid, Venous: 1.2 mmol/L (ref 0.5–1.9)

## 2023-05-29 MED ORDER — ACETAMINOPHEN 325 MG PO TABS
650.0000 mg | ORAL_TABLET | Freq: Once | ORAL | Status: AC
  Administered 2023-05-29: 650 mg via ORAL
  Filled 2023-05-29: qty 2

## 2023-05-29 NOTE — ED Provider Notes (Signed)
 [facility] Provider Note   CSN: [id number] Arrival date & time: 05/29/23  2243     History  Chief Complaint  Patient presents with   Altered Mental Status    Patricia Morgan is a 73 y.o. female.   Altered Mental Status    73 year old female with medical history significant for factor V Leiden, DVT/PE on Eliquis , interstitial lung disease, sarcoidosis, diabetes mellitus, obesity, CHF last EF 30% in 2022 who presents to the emergency department with confusion, fever, cough and shortness of breath.  The patient also endorses a history of diverticulitis and is endorsing left lower quadrant abdominal pain.  States that she has had a cough for the past several days but cannot accurately tell me for how long.  Per family, EMS was called out as the patient was going in and out was last seen normal on Saturday at 1 PM.  She has been intermittently altered, arrives alert and oriented x 2.  Has a history of frequent UTIs as well.  Arrives with EMS febrile to 102.6, CBG was notably 126, tachypneic mildly to 36, hemodynamically stable otherwise BP 152/74.  Home Medications Prior to Admission medications   Medication Sig Start Date End Date Taking? Authorizing Provider  acetaminophen  (TYLENOL ) 500 MG tablet Take 500 mg by mouth every 6 (six) hours as needed for mild pain. Patient not taking: Reported on 09/01/2022    [provider]  allopurinol  (ZYLOPRIM ) 100 MG tablet Take 100 mg by mouth 2 (two) times daily.     [provider]  amoxicillin -clavulanate (AUGMENTIN ) 500-125 MG tablet Take 1 tablet by mouth every 12 (twelve) hours. 03/24/23   Patricia Chew, PA-C  Blood Glucose Monitoring Suppl (TRUE METRIX AIR GLUCOSE METER) w/Device KIT  11/21/16   [provider]  carvedilol  (COREG ) 25 MG tablet TAKE 1 TABLET BY MOUTH TWICE  DAILY 02/08/23   Patricia Morgan, Mihai, MD  Cholecalciferol (VITAMIN D-3) 5000 units TABS Take 5,000 Units by  mouth daily.     [provider]  cholecalciferol (VITAMIN D3) 25 MCG (1000 UNIT) tablet Take 1,000 Units by mouth daily. 02/11/22   [provider]  diphenhydrAMINE  (BENADRYL ) 25 MG tablet Take 25 mg by mouth daily.    [provider]  ELIQUIS  5 MG TABS tablet TAKE 1 TABLET TWICE DAILY 05/09/19   Patricia Maude SAUNDERS, MD  ferrous sulfate  325 (65 FE) MG tablet Take 325 mg by mouth daily with breakfast.    [provider]  Fluocinolone  Acetonide 0.01 % OIL Place 5 drops in ear(s) daily. 06/15/21   Patricia Shaver Scales, PA-C  furosemide  (LASIX ) 80 MG tablet Take 80 mg by mouth daily. 05/23/22   [provider]  hydrALAZINE  (APRESOLINE ) 10 MG tablet TAKE 1 TABLET BY MOUTH 3 TIMES  DAILY 06/27/22   Patricia Morgan, Jerel, MD  isosorbide  mononitrate (IMDUR ) 30 MG 24 hr tablet TAKE 1 TABLET BY MOUTH DAILY 05/22/23   Patricia Morgan, Mihai, MD  oxyCODONE  (OXY IR/ROXICODONE ) 5 MG immediate release tablet Take 1 tablet (5 mg total) by mouth every 6 (six) hours as needed for severe pain (pain score 7-10). 03/24/23   Patricia Morgan, Joshua, PA-C  rosuvastatin  (CRESTOR ) 10 MG tablet TAKE 1 TABLET BY MOUTH DAILY 04/21/23   Patricia Morgan, Mihai, MD  vitamin B-12 (CYANOCOBALAMIN) 1000 MCG tablet Take 1,000 mcg by mouth daily.    [provider]  vitamin E 400 UNIT capsule Take 400 Units by mouth daily.    [provider]  fluticasone  (FLONASE ) 50 MCG/ACT nasal spray Place 2 sprays into the nose daily. 12/16/10 08/12/19  Patricia Patricia Morgan LABOR, MD      Allergies    Patient has no known allergies.    Review of Systems   Review of Systems  All other systems reviewed and are negative.   Physical Exam Updated Vital Signs BP (!) 138/96 (BP Location: Right Arm)   Pulse 93   Temp 99.9 F (37.7 C) (Oral)   Resp (!) 30   SpO2 93%  Physical Exam Vitals and nursing note reviewed.  Constitutional:      General: She is not in acute distress.    Appearance: She is well-developed. She is  obese.     Comments: GCS 14, AAOx2  HENT:     Head: Normocephalic and atraumatic.  Eyes:     Conjunctiva/sclera: Conjunctivae normal.  Neck:     Comments: No meningismus Cardiovascular:     Rate and Rhythm: Normal rate and regular rhythm.  Pulmonary:     Effort: Pulmonary effort is normal. No respiratory distress.     Breath sounds: Normal breath sounds.  Abdominal:     Palpations: Abdomen is soft.     Tenderness: There is abdominal tenderness in the left lower quadrant. There is no guarding or rebound.  Musculoskeletal:        General: No swelling.     Cervical back: Neck supple.     Right lower leg: No edema.     Left lower leg: No edema.  Skin:    General: Skin is warm and dry.     Capillary Refill: Capillary refill takes less than 2 seconds.  Neurological:     Mental Status: She is alert.     Comments: MENTAL STATUS EXAM:    Orientation: Alert and oriented to person, place, disoriented to time Memory: Cooperative, follows commands well.  Language: Speech is clear and language is normal.   CRANIAL NERVES:    CN 2 (Optic): Visual fields intact to confrontation.  CN 3,4,6 (EOM): Pupils equal and reactive to light. Full extraocular eye movement without nystagmus.  CN 5 (Trigeminal): Facial sensation is normal, no weakness of masticatory muscles.  CN 7 (Facial): No facial weakness or asymmetry.  CN 8 (Auditory): Auditory acuity grossly normal.  CN 9,10 (Glossophar): The uvula is midline, the palate elevates symmetrically.  CN 11 (spinal access): Normal sternocleidomastoid and trapezius strength.  CN 12 (Hypoglossal): The tongue is midline. No atrophy or fasciculations.SABRA   MOTOR:  Muscle Strength: 5/5RUE, 5/5LUE, 5/5RLE, 5/5LLE.   COORDINATION:   Intact finger-to-nose, no tremor.   SENSATION:   Intact to light touch all four extremities.     Psychiatric:        Mood and Affect: Mood normal.     ED Results / Procedures / Treatments   Labs (all labs ordered are  listed, but only abnormal results are displayed) Labs Reviewed  RESP PANEL BY RT-PCR (RSV, FLU A&B, COVID)  RVPGX2 - Abnormal; Notable for the following components:      Result Value   Influenza A by PCR POSITIVE (*)    All other components within normal limits  COMPREHENSIVE METABOLIC PANEL - Abnormal; Notable for the following components:   CO2 21 (*)    Glucose, Bld 136 (*)    BUN 44 (*)    Creatinine, Ser 3.14 (*)    GFR, Estimated 15 (*)    All other components within normal limits  PROTIME-INR - Abnormal; Notable  for the following components:   Prothrombin Time 17.9 (*)    INR 1.5 (*)    All other components within normal limits  BRAIN NATRIURETIC PEPTIDE - Abnormal; Notable for the following components:   B Natriuretic Peptide 1,391.9 (*)    All other components within normal limits  CULTURE, BLOOD (ROUTINE X 2)  CULTURE, BLOOD (ROUTINE X 2)  CBC WITH DIFFERENTIAL/PLATELET  URINALYSIS, W/ REFLEX TO CULTURE (INFECTION SUSPECTED)  I-STAT CG4 LACTIC ACID, ED    EKG None  Radiology CT ABDOMEN PELVIS WO CONTRAST Result Date: 05/30/2023 CLINICAL DATA:  LLQ abdominal pain Diverticulitis, complication suspected GFR 15, LLQ abd pain EXAM: CT ABDOMEN AND PELVIS WITHOUT CONTRAST TECHNIQUE: Multidetector CT imaging of the abdomen and pelvis was performed following the standard protocol without IV contrast. RADIATION DOSE REDUCTION: This exam was performed according to the departmental dose-optimization program which includes automated exposure control, adjustment of the mA and/or kV according to patient size and/or use of iterative reconstruction technique. COMPARISON:  CT abdomen pelvis 03/24/2023 cholecystic x-ray 05/29/2023 FINDINGS: Lower chest: Right lower lobe peribronchovascular patchy airspace opacity. Cardiac lead noted. Hepatobiliary: No focal liver abnormality. No gallstones, gallbladder wall thickening, or pericholecystic fluid. No biliary dilatation. Pancreas: No focal lesion.  Normal pancreatic contour. No surrounding inflammatory changes. No main pancreatic ductal dilatation. Spleen: Normal in size without focal abnormality. Adrenals/Urinary Tract: No adrenal nodule bilaterally. No nephrolithiasis and no hydronephrosis. Fluid density lesions of the bilateral kidneys likely represent simple renal cysts. Simple renal cysts, in the absence of clinically indicated signs/symptoms, require no independent follow-up. No ureterolithiasis or hydroureter. The urinary bladder is unremarkable. Stomach/Bowel: Stomach is within normal limits. No evidence of bowel wall thickening or dilatation. Colonic diverticulosis. Appendix appears normal. Vascular/Lymphatic: No abdominal aorta or iliac aneurysm. Mild atherosclerotic plaque of the aorta and its branches. No abdominal, pelvic, or inguinal lymphadenopathy. Reproductive: Subcentimeter calcified intramural uterine lesions consistent with uterine fibroids. Otherwise uterus and bilateral adnexa are unremarkable. Other: No intraperitoneal free fluid. No intraperitoneal free gas. No organized fluid collection. Musculoskeletal: No abdominal wall hernia or abnormality. Tiny fat containing umbilical hernia. No suspicious lytic or blastic osseous lesions. No acute displaced fracture. L4-L5 and L5-1 inter vertebral disc space vacuum phenomenon. IMPRESSION: 1. Right lower lobe pneumonia. Followup PA and lateral chest X-ray is recommended in 3-4 weeks following therapy to ensure resolution and exclude underlying malignancy. 2. Colonic diverticulosis with no acute diverticulitis. 3. Uterine fibroids. 4.  Aortic Atherosclerosis (ICD10-I70.0). Electronically Signed   By: Morgane  Naveau M.D.   On: 05/30/2023 01:19   CT HEAD WO CONTRAST ( ) Result Date: 05/30/2023 CLINICAL DATA:  Mental status change, unknown cause She is altered, but it is intermittent. A/O x2. EXAM: CT HEAD WITHOUT CONTRAST TECHNIQUE: Contiguous axial images were obtained from the base of the  skull through the vertex without intravenous contrast. RADIATION DOSE REDUCTION: This exam was performed according to the departmental dose-optimization program which includes automated exposure control, adjustment of the mA and/or kV according to patient size and/or use of iterative reconstruction technique. COMPARISON:  None Available. FINDINGS: Brain: Patchy and confluent areas of decreased attenuation are noted throughout the deep and periventricular white matter of the cerebral hemispheres bilaterally, compatible with chronic microvascular ischemic disease. Chronic appearing right cerebral lacunar infarction versus dilatation of the pre-vascular space (5:16). No evidence of large-territorial acute infarction. No parenchymal hemorrhage. No mass lesion. No extra-axial collection. No mass effect or midline shift. No hydrocephalus. Basilar cisterns are patent. Vascular: No hyperdense vessel. Atherosclerotic calcifications  are present within the cavernous internal carotid arteries. Skull: No acute fracture or focal lesion. Sinuses/Orbits: Paranasal sinuses and mastoid air cells are clear. Bilateral lens replacement. Otherwise the orbits are unremarkable. Other: None. IMPRESSION: No acute intracranial abnormality. Electronically Signed   By: Morgane  Naveau M.D.   On: 05/30/2023 01:09   DG Chest Portable 1 View Result Date: 05/29/2023 CLINICAL DATA:  Sepsis altered EXAM: PORTABLE CHEST 1 VIEW COMPARISON:  03/15/2018 FINDINGS: Left-sided pacing device as before. Cardiomegaly with vascular congestion. Possible trace right effusion. No consolidation or pneumothorax. Aortic atherosclerosis IMPRESSION: Cardiomegaly with vascular congestion and possible trace right effusion. Electronically Signed   By: Luke Bun M.D.   On: 05/29/2023 23:22    Procedures Procedures    Medications Ordered in ED Medications  cefTRIAXone  (ROCEPHIN ) 1 g in sodium chloride  0.9 % 100 mL IVPB (has no administration in time range)   azithromycin  (ZITHROMAX ) 500 mg in sodium chloride  0.9 % 250 mL IVPB (has no administration in time range)  oseltamivir  (TAMIFLU ) capsule 75 mg (has no administration in time range)  acetaminophen  (TYLENOL ) tablet 650 mg (650 mg Oral Given 05/29/23 2351)  fentaNYL  (SUBLIMAZE ) injection 50 mcg (50 mcg Intravenous Given 05/30/23 0112)    ED Course/ Medical Decision Making/ A&P Clinical Course as of 05/30/23 0127  Tue May 30, 2023  0001 Temp(!): 103.2 F (39.6 C) [JL]  0001 Pulse Rate: 93 [JL]  0051 Influenza A By PCR(!): POSITIVE [JL]  0051 B Natriuretic Peptide(!): 1,391.9 [JL]    Clinical Course User Index [JL] Jerrol Agent, MD                                 Medical Decision Making Amount and/or Complexity of Data Reviewed Labs: ordered. Decision-making details documented in ED Course. Radiology: ordered.  Risk OTC drugs. Prescription drug management. Decision regarding hospitalization.    73 year old female with medical history significant for factor V Leiden, DVT/PE on Eliquis , interstitial lung disease, sarcoidosis, diabetes mellitus, obesity, CHF last EF 30% in 2022 who presents to the emergency department with confusion, fever, cough and shortness of breath.  The patient also endorses a history of diverticulitis and is endorsing left lower quadrant abdominal pain.  States that she has had a cough for the past several days but cannot accurately tell me for how long.  Per family, EMS was called out as the patient was going in and out was last seen normal on Saturday at 1 PM.  She has been intermittently altered, arrives alert and oriented x 2.  Has a history of frequent UTIs as well.  Arrives with EMS febrile to 102.6, CBG was notably 126, tachypneic mildly to 36, hemodynamically stable otherwise BP 152/74.  On arrival, the patient was febrile temperature 103.2, heart rate 93, respiratory rate initially 17, subsequently documented at 30, hemodynamically stable BP 138/96,  saturating 93% on room air.  Sinus rhythm noted on cardiac telemetry.  Physical exam with mild left lower quadrant tenderness to palpation.  The patient was found to be GCS 14, AAO x 2, no focal neurologic deficits noted.  Patient is presenting with a febrile illness, considered viral infection, cough, considered bacterial pneumonia.  She does admit to missing doses of her home Eliquis  and she has a history of factor V Leiden and DVT PE.  Considered PE, bacterial pneumonia, considered diverticulitis, other acute intra-abdominal infection.  Considered other toxic metabolic derangement such as electrolyte abnormality or  infectious etiology such as urinary tract infection.  Patient with range of motion of the head, no meningismus, low concern for meningitis.  Septic workup initiated with blood cultures, urinalysis, urine cultures, chest x-ray and screening labs.  The patient's history of CKD and CHF, fluid resuscitation was not initiated.  A chest x-ray revealed cardiomegaly and vascular congestion with possible trace pleural effusion, no focal consolidation, no pneumothorax.  COVID and influenza PCR testing was collected and resulted positive for influenza A.   The patient was administered Tylenol  for her fever.  Remained acutely and mildly confused.  Laboratory evaluation revealed a CBC without a leukocytosis or anemia, lactic acid normal at 1.2, CMP without significant electrolyte abnormality, worsening creatinine compared to the patient's baseline CKD with a creatinine of 3.14 from a baseline of 2.6-3. Pt without pitting edema in the lower extremities. BNP elevated at 1392.  Given the patient's poor renal function, CT of the abdomen pelvis was obtained without contrast in addition to CT head given the patient's confusion.  She has no focal neurologic deficits to suggest acute stroke.  A nuc med VQ scan was ordered as the patient admits to missing her home Eliquis .  CT Head: Negative  CT Abdomen Pelvis  WO: IMPRESSION:  1. Right lower lobe pneumonia. Followup PA and lateral chest X-ray  is recommended in 3-4 weeks following therapy to ensure resolution  and exclude underlying malignancy.  2. Colonic diverticulosis with no acute diverticulitis.  3. Uterine fibroids.  4.  Aortic Atherosclerosis (ICD10-I70.0).   Concern for developing CAP superimposed on her influenza infection, ABX ordered. EKG without long QT ordered Rocephin  and Azithromycin . Tamiflu  ordered.  Hospitalist medicine consulted for admission given her ongoing confusion in the setting of her influenza infection and likely developing PNA. Family updated bedside regarding the plan of care, Dr. Floy accepting.    Final Clinical Impression(s) / ED Diagnoses Final diagnoses:  Altered mental status, unspecified altered mental status type  Influenza A  Community acquired pneumonia of right lower lobe of lung    Rx / DC Orders ED Discharge Orders     None         Jerrol Agent, MD 05/30/23 0206

## 2023-05-29 NOTE — ED Triage Notes (Signed)
 Pt arrives via [facility] from home- lives alone. Per EMS report. ....SABRADaughter was on the phone with her, said she was going in and out. LSN Saturday @ 1pm. She is altered, but it is intermittent. A/O x2.Has frequent UTI's. Tremors that are new.  Hot to touch. En route, 102.6, cbg 126, rr 36, 152/74.

## 2023-05-29 NOTE — ED Triage Notes (Signed)
 Pt is able to tell me where she is and what her name is. She says she normally gets around on her Rolator, but has been weak today. She says she has diverticulitis and her stomach hurts, she has been vomiting. Has had a cough.

## 2023-05-30 ENCOUNTER — Encounter (HOSPITAL_COMMUNITY): Payer: Self-pay | Admitting: Family Medicine

## 2023-05-30 ENCOUNTER — Emergency Department (HOSPITAL_COMMUNITY): Payer: 59

## 2023-05-30 ENCOUNTER — Inpatient Hospital Stay (HOSPITAL_COMMUNITY): Payer: 59

## 2023-05-30 DIAGNOSIS — E1122 Type 2 diabetes mellitus with diabetic chronic kidney disease: Secondary | ICD-10-CM | POA: Diagnosis present

## 2023-05-30 DIAGNOSIS — Z86718 Personal history of other venous thrombosis and embolism: Secondary | ICD-10-CM | POA: Diagnosis not present

## 2023-05-30 DIAGNOSIS — J14 Pneumonia due to Hemophilus influenzae: Secondary | ICD-10-CM

## 2023-05-30 DIAGNOSIS — J849 Interstitial pulmonary disease, unspecified: Secondary | ICD-10-CM | POA: Diagnosis present

## 2023-05-30 DIAGNOSIS — J9601 Acute respiratory failure with hypoxia: Secondary | ICD-10-CM

## 2023-05-30 DIAGNOSIS — J690 Pneumonitis due to inhalation of food and vomit: Secondary | ICD-10-CM | POA: Diagnosis present

## 2023-05-30 DIAGNOSIS — J09X1 Influenza due to identified novel influenza A virus with pneumonia: Secondary | ICD-10-CM | POA: Diagnosis not present

## 2023-05-30 DIAGNOSIS — Z9011 Acquired absence of right breast and nipple: Secondary | ICD-10-CM | POA: Diagnosis not present

## 2023-05-30 DIAGNOSIS — D6851 Activated protein C resistance: Secondary | ICD-10-CM

## 2023-05-30 DIAGNOSIS — E785 Hyperlipidemia, unspecified: Secondary | ICD-10-CM | POA: Diagnosis present

## 2023-05-30 DIAGNOSIS — J1008 Influenza due to other identified influenza virus with other specified pneumonia: Secondary | ICD-10-CM | POA: Diagnosis present

## 2023-05-30 DIAGNOSIS — M109 Gout, unspecified: Secondary | ICD-10-CM | POA: Diagnosis present

## 2023-05-30 DIAGNOSIS — K5730 Diverticulosis of large intestine without perforation or abscess without bleeding: Secondary | ICD-10-CM | POA: Diagnosis not present

## 2023-05-30 DIAGNOSIS — G4733 Obstructive sleep apnea (adult) (pediatric): Secondary | ICD-10-CM

## 2023-05-30 DIAGNOSIS — Z95810 Presence of automatic (implantable) cardiac defibrillator: Secondary | ICD-10-CM | POA: Diagnosis not present

## 2023-05-30 DIAGNOSIS — J101 Influenza due to other identified influenza virus with other respiratory manifestations: Secondary | ICD-10-CM | POA: Diagnosis present

## 2023-05-30 DIAGNOSIS — N184 Chronic kidney disease, stage 4 (severe): Secondary | ICD-10-CM | POA: Diagnosis present

## 2023-05-30 DIAGNOSIS — A419 Sepsis, unspecified organism: Secondary | ICD-10-CM

## 2023-05-30 DIAGNOSIS — I130 Hypertensive heart and chronic kidney disease with heart failure and stage 1 through stage 4 chronic kidney disease, or unspecified chronic kidney disease: Secondary | ICD-10-CM | POA: Diagnosis present

## 2023-05-30 DIAGNOSIS — Z86711 Personal history of pulmonary embolism: Secondary | ICD-10-CM | POA: Diagnosis not present

## 2023-05-30 DIAGNOSIS — Z9221 Personal history of antineoplastic chemotherapy: Secondary | ICD-10-CM | POA: Diagnosis not present

## 2023-05-30 DIAGNOSIS — A4189 Other specified sepsis: Secondary | ICD-10-CM | POA: Diagnosis present

## 2023-05-30 DIAGNOSIS — I10 Essential (primary) hypertension: Secondary | ICD-10-CM | POA: Diagnosis not present

## 2023-05-30 DIAGNOSIS — K429 Umbilical hernia without obstruction or gangrene: Secondary | ICD-10-CM | POA: Diagnosis not present

## 2023-05-30 DIAGNOSIS — R0602 Shortness of breath: Secondary | ICD-10-CM | POA: Diagnosis not present

## 2023-05-30 DIAGNOSIS — G9341 Metabolic encephalopathy: Secondary | ICD-10-CM

## 2023-05-30 DIAGNOSIS — Z6839 Body mass index (BMI) 39.0-39.9, adult: Secondary | ICD-10-CM | POA: Diagnosis not present

## 2023-05-30 DIAGNOSIS — N281 Cyst of kidney, acquired: Secondary | ICD-10-CM | POA: Diagnosis not present

## 2023-05-30 DIAGNOSIS — I5021 Acute systolic (congestive) heart failure: Secondary | ICD-10-CM

## 2023-05-30 DIAGNOSIS — R4182 Altered mental status, unspecified: Secondary | ICD-10-CM | POA: Diagnosis not present

## 2023-05-30 DIAGNOSIS — Z1152 Encounter for screening for COVID-19: Secondary | ICD-10-CM | POA: Diagnosis not present

## 2023-05-30 DIAGNOSIS — I428 Other cardiomyopathies: Secondary | ICD-10-CM | POA: Diagnosis present

## 2023-05-30 DIAGNOSIS — I5022 Chronic systolic (congestive) heart failure: Secondary | ICD-10-CM | POA: Diagnosis present

## 2023-05-30 DIAGNOSIS — I6523 Occlusion and stenosis of bilateral carotid arteries: Secondary | ICD-10-CM | POA: Diagnosis not present

## 2023-05-30 LAB — BASIC METABOLIC PANEL
Anion gap: 10 (ref 5–15)
BUN: 45 mg/dL — ABNORMAL HIGH (ref 8–23)
CO2: 25 mmol/L (ref 22–32)
Calcium: 9.4 mg/dL (ref 8.9–10.3)
Chloride: 104 mmol/L (ref 98–111)
Creatinine, Ser: 2.96 mg/dL — ABNORMAL HIGH (ref 0.44–1.00)
GFR, Estimated: 16 mL/min — ABNORMAL LOW (ref >60)
Glucose, Bld: 127 mg/dL — ABNORMAL HIGH (ref 70–99)
Potassium: 4 mmol/L (ref 3.5–5.1)
Sodium: 139 mmol/L (ref 135–145)

## 2023-05-30 LAB — URINALYSIS, W/ REFLEX TO CULTURE (INFECTION SUSPECTED)
Bilirubin Urine: NEGATIVE
Glucose, UA: NEGATIVE mg/dL
Hgb urine dipstick: NEGATIVE
Ketones, ur: NEGATIVE mg/dL
Leukocytes,Ua: NEGATIVE
Nitrite: NEGATIVE
Protein, ur: >=300 mg/dL — AB
Specific Gravity, Urine: 1.011 (ref 1.005–1.030)
pH: 6 (ref 5.0–8.0)

## 2023-05-30 LAB — C-REACTIVE PROTEIN: CRP: 2.4 mg/dL — ABNORMAL HIGH (ref <1.0)

## 2023-05-30 LAB — CBC WITH DIFFERENTIAL/PLATELET
Abs Immature Granulocytes: 0.02 10*3/uL (ref 0.00–0.07)
Basophils Absolute: 0 10*3/uL (ref 0.0–0.1)
Basophils Relative: 1 %
Eosinophils Absolute: 0 10*3/uL (ref 0.0–0.5)
Eosinophils Relative: 0 %
HCT: 32.5 % — ABNORMAL LOW (ref 36.0–46.0)
Hemoglobin: 10.4 g/dL — ABNORMAL LOW (ref 12.0–15.0)
Immature Granulocytes: 0 %
Lymphocytes Relative: 17 %
Lymphs Abs: 0.8 10*3/uL (ref 0.7–4.0)
MCH: 31 pg (ref 26.0–34.0)
MCHC: 32 g/dL (ref 30.0–36.0)
MCV: 96.7 fL (ref 80.0–100.0)
Monocytes Absolute: 0.5 10*3/uL (ref 0.1–1.0)
Monocytes Relative: 10 %
Neutro Abs: 3.5 10*3/uL (ref 1.7–7.7)
Neutrophils Relative %: 72 %
Platelets: 129 10*3/uL — ABNORMAL LOW (ref 150–400)
RBC: 3.36 MIL/uL — ABNORMAL LOW (ref 3.87–5.11)
RDW: 14.8 % (ref 11.5–15.5)
WBC: 4.9 10*3/uL (ref 4.0–10.5)
nRBC: 0 % (ref 0.0–0.2)

## 2023-05-30 LAB — BRAIN NATRIURETIC PEPTIDE: B Natriuretic Peptide: 1391.9 pg/mL — ABNORMAL HIGH (ref 0.0–100.0)

## 2023-05-30 LAB — CBG MONITORING, ED
Glucose-Capillary: 106 mg/dL — ABNORMAL HIGH (ref 70–99)
Glucose-Capillary: 148 mg/dL — ABNORMAL HIGH (ref 70–99)
Glucose-Capillary: 109 mg/dL — ABNORMAL HIGH (ref 70–99)
Glucose-Capillary: 105 mg/dL — ABNORMAL HIGH (ref 70–99)

## 2023-05-30 LAB — LACTIC ACID, PLASMA
Lactic Acid, Venous: 1.4 mmol/L (ref 0.5–1.9)
Lactic Acid, Venous: 1.5 mmol/L (ref 0.5–1.9)

## 2023-05-30 LAB — D-DIMER, QUANTITATIVE: D-Dimer, Quant: 1.87 ug{FEU}/mL — ABNORMAL HIGH (ref 0.00–0.50)

## 2023-05-30 LAB — RESP PANEL BY RT-PCR (RSV, FLU A&B, COVID)  RVPGX2
Influenza A by PCR: POSITIVE — AB
Influenza B by PCR: NEGATIVE
Resp Syncytial Virus by PCR: NEGATIVE
SARS Coronavirus 2 by RT PCR: NEGATIVE

## 2023-05-30 LAB — STREP PNEUMONIAE URINARY ANTIGEN: Strep Pneumo Urinary Antigen: NEGATIVE

## 2023-05-30 LAB — PROCALCITONIN: Procalcitonin: 0.21 ng/mL

## 2023-05-30 LAB — HEMOGLOBIN A1C
Hgb A1c MFr Bld: 5.4 % (ref 4.8–5.6)
Mean Plasma Glucose: 108.28 mg/dL

## 2023-05-30 MED ORDER — ACETAMINOPHEN 650 MG RE SUPP: 650.0000 mg | Freq: Four times a day (QID) | RECTAL | Status: DC | PRN

## 2023-05-30 MED ORDER — FENTANYL CITRATE PF 50 MCG/ML IJ SOSY
50.0000 ug | PREFILLED_SYRINGE | Freq: Once | INTRAMUSCULAR | Status: AC
  Administered 2023-05-30: 50 ug via INTRAVENOUS
  Filled 2023-05-30: qty 1

## 2023-05-30 MED ORDER — SODIUM CHLORIDE 0.9 % IV SOLN
2.0000 g | INTRAVENOUS | Status: DC
  Administered 2023-05-30 – 2023-05-31 (×2): 2 g via INTRAVENOUS
  Filled 2023-05-30 (×2): qty 20

## 2023-05-30 MED ORDER — HYDROCOD POLI-CHLORPHE POLI ER 10-8 MG/5ML PO SUER
2.5000 mL | Freq: Two times a day (BID) | ORAL | Status: DC | PRN
  Administered 2023-05-30 – 2023-05-31 (×2): 2.5 mL via ORAL
  Filled 2023-05-30 (×2): qty 5

## 2023-05-30 MED ORDER — CARVEDILOL 12.5 MG PO TABS: 25.0000 mg | ORAL_TABLET | Freq: Two times a day (BID) | ORAL | Status: DC

## 2023-05-30 MED ORDER — INSULIN ASPART 100 UNIT/ML IJ SOLN: 0.0000 [IU] | Freq: Every day | INTRAMUSCULAR | Status: DC

## 2023-05-30 MED ORDER — TECHNETIUM TO 99M ALBUMIN AGGREGATED
4.4000 | Freq: Once | INTRAVENOUS | Status: AC | PRN
  Administered 2023-05-30: 4.4 via INTRAVENOUS

## 2023-05-30 MED ORDER — GUAIFENESIN ER 600 MG PO TB12
1200.0000 mg | ORAL_TABLET | Freq: Two times a day (BID) | ORAL | Status: DC
  Administered 2023-05-30 – 2023-06-01 (×4): 1200 mg via ORAL
  Filled 2023-05-30 (×4): qty 2

## 2023-05-30 MED ORDER — INSULIN ASPART 100 UNIT/ML IJ SOLN
0.0000 [IU] | Freq: Three times a day (TID) | INTRAMUSCULAR | Status: DC
  Administered 2023-05-30: 2 [IU] via SUBCUTANEOUS

## 2023-05-30 MED ORDER — GUAIFENESIN 100 MG/5ML PO LIQD: 5.0000 mL | ORAL | Status: DC | PRN

## 2023-05-30 MED ORDER — SODIUM CHLORIDE 0.9 % IV SOLN
500.0000 mg | Freq: Once | INTRAVENOUS | Status: AC
  Administered 2023-05-30: 500 mg via INTRAVENOUS
  Filled 2023-05-30: qty 5

## 2023-05-30 MED ORDER — HEPARIN SODIUM (PORCINE) 5000 UNIT/ML IJ SOLN: 5000.0000 [IU] | Freq: Three times a day (TID) | INTRAMUSCULAR | Status: DC

## 2023-05-30 MED ORDER — CARVEDILOL 25 MG PO TABS
25.0000 mg | ORAL_TABLET | Freq: Two times a day (BID) | ORAL | Status: DC
  Administered 2023-05-30 – 2023-06-01 (×5): 25 mg via ORAL
  Filled 2023-05-30: qty 1
  Filled 2023-05-30: qty 2
  Filled 2023-05-30: qty 1
  Filled 2023-05-30: qty 2
  Filled 2023-05-30: qty 1

## 2023-05-30 MED ORDER — SODIUM CHLORIDE 0.9 % IV SOLN
1.0000 g | Freq: Once | INTRAVENOUS | Status: AC
  Administered 2023-05-30: 1 g via INTRAVENOUS
  Filled 2023-05-30: qty 10

## 2023-05-30 MED ORDER — ALLOPURINOL 100 MG PO TABS
100.0000 mg | ORAL_TABLET | Freq: Two times a day (BID) | ORAL | Status: DC
  Administered 2023-05-30 – 2023-06-01 (×5): 100 mg via ORAL
  Filled 2023-05-30 (×5): qty 1

## 2023-05-30 MED ORDER — OSELTAMIVIR PHOSPHATE 75 MG PO CAPS
75.0000 mg | ORAL_CAPSULE | Freq: Once | ORAL | Status: AC
  Administered 2023-05-30: 75 mg via ORAL
  Filled 2023-05-30: qty 1

## 2023-05-30 MED ORDER — BENZONATATE 100 MG PO CAPS
100.0000 mg | ORAL_CAPSULE | Freq: Three times a day (TID) | ORAL | Status: DC
  Administered 2023-05-30 – 2023-06-01 (×6): 100 mg via ORAL
  Filled 2023-05-30 (×6): qty 1

## 2023-05-30 MED ORDER — FUROSEMIDE 10 MG/ML IJ SOLN
60.0000 mg | Freq: Once | INTRAMUSCULAR | Status: AC
  Administered 2023-05-30: 60 mg via INTRAVENOUS
  Filled 2023-05-30: qty 6

## 2023-05-30 MED ORDER — ACETAMINOPHEN 325 MG PO TABS
650.0000 mg | ORAL_TABLET | Freq: Four times a day (QID) | ORAL | Status: DC | PRN
  Administered 2023-05-30 – 2023-05-31 (×2): 650 mg via ORAL
  Filled 2023-05-30 (×2): qty 2

## 2023-05-30 MED ORDER — APIXABAN 5 MG PO TABS
5.0000 mg | ORAL_TABLET | Freq: Two times a day (BID) | ORAL | Status: DC
  Administered 2023-05-30 – 2023-06-01 (×5): 5 mg via ORAL
  Filled 2023-05-30 (×5): qty 1

## 2023-05-30 MED ORDER — DEXTROSE 5 % IV SOLN
100.0000 mg | Freq: Two times a day (BID) | INTRAVENOUS | Status: DC
  Administered 2023-05-30 – 2023-05-31 (×4): 100 mg via INTRAVENOUS
  Filled 2023-05-30 (×7): qty 100

## 2023-05-30 MED ORDER — ROSUVASTATIN CALCIUM 5 MG PO TABS
10.0000 mg | ORAL_TABLET | Freq: Every day | ORAL | Status: DC
  Administered 2023-05-30 – 2023-06-01 (×3): 10 mg via ORAL
  Filled 2023-05-30 (×3): qty 2

## 2023-05-30 MED ORDER — OSELTAMIVIR PHOSPHATE 30 MG PO CAPS
30.0000 mg | ORAL_CAPSULE | Freq: Every day | ORAL | Status: DC
  Administered 2023-05-30 – 2023-06-01 (×3): 30 mg via ORAL
  Filled 2023-05-30 (×3): qty 1

## 2023-05-30 NOTE — ED Notes (Signed)
 Patient placed on 2L O2 via [place] for SPO2 desat

## 2023-05-30 NOTE — Progress Notes (Signed)
   05/30/23 0345  Oxygen  Therapy/Pulse Ox  O2 Device [facility]HFNC  $ High Flow Nasal Cannula  Yes  O2 Therapy Oxygen  humidified  O2 Flow Rate (L/min) 40 L/min  FiO2 (%) 45 %   Placed pt on HHFNC pt said  being on bipap helped her to feel better and she is more alert sats are 99% on above settings

## 2023-05-30 NOTE — Progress Notes (Signed)
 The patient is a 73 yr old woman who has a past medical history significant for OSA on CPAP, HTN, CKD IV, factor V Leiden, HFrEF. The patient presented to [facility] on 05/28/2022 with complaings of fever, chills, nausea, vomiting, diarrhea, cough, shortness of breath, wheezing. She has also been lightheaded and dizzy. She states that she has been unusually somnolent, and has fallen asleep on the phone with her daughter. She states that she has had abdominal pain myalgia and coldness. The diarrhea has not resolved.   In the ED the patient was found to be positive for Influenza A. CT chest demonstrated a Rt LL pneumonia. D Dimer was elevated at 1.87. INR was 1.5 She was saturating 86% on room air. CXR demonstrated cardiomegaly an vascular congestion.   She was admitted as inpatient by my colleague, Dr. Laveda early this morning. At that time she was requiring 4L to maintain saturations in the low 90's. By 8:00 am she was requiring HHF at 45% FIO2 and 40L.   Upon my visit the patient was complaining of pain in her mid abdomen when she coughed. She stated that this was due to her diverticulitis. Apparently she was disgnosed with diverticulitis in November and given antibiotics. CT abdomen and pelvis performed in the ED demonstrated diverticulosis, but no diverticulitis. There was no bowel wall thickening or source for the patient's complaints of abdominal cough. I believe that this is likely musculoskeletal pain made worse by coughing. She was given low dose Tussionex as a cough suppressant twice a day.   On exam the patient was awake, alert, and oriented x 3. She was in no acute distress. Heart sounds were normal.  Lung sounds were greatly diminished. Abdomen was soft, non-tender, non-distended. Bowel sounds were normo-active. No cyanosis, clubbing or edema.  Daughter was at bedside. All questions were answered to the best of my ability.

## 2023-05-30 NOTE — ED Notes (Signed)
 ED TO INPATIENT HANDOFF REPORT  ED Nurse Name and Phone #: Lorenza [phone]  S Name/Age/Gender Patricia Morgan 73 y.o. female Room/Bed: 009C/009C  Code Status   Code Status: Full Code  Home/SNF/Other Home Patient oriented to: self, place, time, and situation Is this baseline? Yes   Triage Complete: Triage complete  Chief Complaint Influenza A with pneumonia [J09.X1]  Triage Note Pt arrives via [facility] from home- lives alone. Per EMS report. ....SABRADaughter was on the phone with her, said she was going in and out. LSN Saturday @ 1pm. She is altered, but it is intermittent. A/O x2.Has frequent UTI's. Tremors that are new.  Hot to touch. En route, 102.6, cbg 126, rr 36, 152/74.   Pt is able to tell me where she is and what her name is. She says she normally gets around on her Rolator, but has been weak today. She says she has diverticulitis and her stomach hurts, she has been vomiting. Has had a cough.    Allergies No Known Allergies  Level of Care/Admitting Diagnosis ED Disposition     ED Disposition  Admit   Condition  --   Comment  Hospital Area: [facility] [[id number]]  Level of Care: Telemetry Medical [104]  May admit patient to [facility] or [facility] if equivalent level of care is available:: Yes  Covid Evaluation: Asymptomatic - no recent exposure (last 10 days) testing not required  Diagnosis: Influenza A with pneumonia [[id number]]  Admitting Physician: LAVEDA ROOSEVELT [[id number]]  Attending Physician: LAVEDA ROOSEVELT [[id number]]  Certification:: I certify this patient will need inpatient services for at least 2 midnights  Expected Medical Readiness: 06/01/2023          B Medical/Surgery History Past Medical History:  Diagnosis Date   ANEMIA-NOS    BREAST CANCER, HX OF 1994 & 1995   1994>>recurrence in 1995, R breast, s/p lumpectomy>>mastectomy   Cancer (HCC)    Cardiomyopathy, nonischemic (HCC)    CHF (congestive heart failure) (HCC)    chronic  systolic and diastolic CHF   CHRONIC KIDNEY DISEASE STAGE III (MODERATE)    Clotting disorder (HCC)    on eliquis  for Factor 5 disorder   Colon polyps    DIABETES MELLITUS, TYPE II    Diverticulosis    HYPERLIPIDEMIA    HYPERTENSION    ILD (interstitial lung disease) (HCC)    10/2018 CT findings suggestive of sarcoidosis; followed by Dr. Jude   OBSTRUCTIVE SLEEP APNEA    CPAP   Personal history of chemotherapy    Proteinuria    PULMONARY EMBOLISM 09/24/2008   anticoag thru 03/2010   Rotator cuff tear    Left   Shoulder impingement, left    Sleep apnea    Wears CPAP nightly   Past Surgical History:  Procedure Laterality Date   BREAST SURGERY     CARDIAC CATHETERIZATION N/A 11/02/2015   Procedure: Right/Left Heart Cath and Coronary Angiography;  Surgeon: Lonni JONETTA Cash, MD;  Location: [facility] INVASIVE CV LAB;  Service: Cardiovascular;  Laterality: N/A;   DILATION AND CURETTAGE OF UTERUS     ICD IMPLANT N/A 03/14/2018   Procedure: ICD IMPLANT;  Surgeon: Francyne Headland, MD;  Location: [facility] INVASIVE CV LAB;  Service: Cardiovascular;  Laterality: N/A;   insertion port a cath     MASTECTOMY Right 1995   Right   PORT-A-CATH REMOVAL     SHOULDER ARTHROSCOPY WITH ROTATOR CUFF REPAIR AND SUBACROMIAL DECOMPRESSION Left 01/24/2019   Procedure: SHOULDER ARTHROSCOPY WITH ROTATOR  CUFF REPAIR AND SUBACROMIAL DECOMPRESSION;  Surgeon: Sharl Selinda Dover, MD;  Location: [facility] OR;  Service: Orthopedics;  Laterality: Left;  2.5 hrs     A IV Location/Drains/Wounds Patient Lines/Drains/Airways Status     Active Line/Drains/Airways     Name Placement date Placement time Site Days   Peripheral IV 05/29/23 20 G Left Antecubital 05/29/23  2346  Antecubital  1   Peripheral IV 05/29/23 20 G Anterior;Left Forearm 05/29/23  --  Forearm  1            Intake/Output Last 24 hours No intake or output data in the 24 hours ending 05/30/23 2044  Labs/Imaging Results for orders placed or performed  during the hospital encounter of 05/29/23 (from the past 48 hours)  Comprehensive metabolic panel     Status: Abnormal   Collection Time: 05/29/23 11:02 PM  Result Value Ref Range   Sodium 136 135 - 145 mmol/L   Potassium 4.3 3.5 - 5.1 mmol/L   Chloride 101 98 - 111 mmol/L   CO2 21 (L) 22 - 32 mmol/L   Glucose, Bld 136 (H) 70 - 99 mg/dL    Comment: Glucose reference range applies only to samples taken after fasting for at least 8 hours.   BUN 44 (H) 8 - 23 mg/dL   Creatinine, Ser 6.85 (H) 0.44 - 1.00 mg/dL   Calcium  10.1 8.9 - 10.3 mg/dL   Total Protein 7.9 6.5 - 8.1 g/dL   Albumin  3.9 3.5 - 5.0 g/dL   AST 30 15 - 41 U/L   ALT 15 0 - 44 U/L   Alkaline Phosphatase 104 38 - 126 U/L   Total Bilirubin 1.1 0.0 - 1.2 mg/dL   GFR, Estimated 15 (L) >60 mL/min    Comment: (NOTE) Calculated using the CKD-EPI Creatinine Equation (2021)    Anion gap 14 5 - 15    Comment: Performed at [facility], [address] [place], [place] [address]  CBC with Differential     Status: None   Collection Time: 05/29/23 11:02 PM  Result Value Ref Range   WBC 6.7 4.0 - 10.5 K/uL   RBC 3.94 3.87 - 5.11 MIL/uL   Hemoglobin 12.1 12.0 - 15.0 g/dL   HCT 62.2 63.9 - 53.9 %   MCV 95.7 80.0 - 100.0 fL   MCH 30.7 26.0 - 34.0 pg   MCHC 32.1 30.0 - 36.0 g/dL   RDW 85.3 88.4 - 84.4 %   Platelets 150 150 - 400 K/uL   nRBC 0.0 0.0 - 0.2 %   Neutrophils Relative % 77 %   Neutro Abs 5.2 1.7 - 7.7 K/uL   Lymphocytes Relative 15 %   Lymphs Abs 1.0 0.7 - 4.0 K/uL   Monocytes Relative 8 %   Monocytes Absolute 0.6 0.1 - 1.0 K/uL   Eosinophils Relative 0 %   Eosinophils Absolute 0.0 0.0 - 0.5 K/uL   Basophils Relative 0 %   Basophils Absolute 0.0 0.0 - 0.1 K/uL   Immature Granulocytes 0 %   Abs Immature Granulocytes 0.02 0.00 - 0.07 K/uL    Comment: Performed at [facility], [address], [place], [place] [address]  Protime-INR     Status: Abnormal   Collection Time: 05/29/23 11:02 PM  Result  Value Ref Range   Prothrombin Time 17.9 (H) 11.4 - 15.2 seconds   INR 1.5 (H) 0.8 - 1.2    Comment: (NOTE) INR goal varies based on device and  disease states. Performed at [facility], [address] [place], [place] [address]   Culture, blood (Routine x 2)     Status: None (Preliminary result)   Collection Time: 05/29/23 11:02 PM   Specimen: BLOOD  Result Value Ref Range   Specimen Description BLOOD SITE NOT SPECIFIED    Special Requests      BOTTLES DRAWN AEROBIC AND ANAEROBIC Blood Culture results may not be optimal due to an inadequate volume of blood received in culture bottles   Culture      NO GROWTH < 12 HOURS Performed at [facility], [address] [place], [place] [address]    Report Status PENDING   I-Stat Lactic Acid, ED     Status: None   Collection Time: 05/29/23 11:11 PM  Result Value Ref Range   Lactic Acid, Venous 1.2 0.5 - 1.9 mmol/L  Culture, blood (Routine x 2)     Status: None (Preliminary result)   Collection Time: 05/29/23 11:33 PM   Specimen: BLOOD  Result Value Ref Range   Specimen Description BLOOD LEFT ANTECUBITAL    Special Requests      BOTTLES DRAWN AEROBIC AND ANAEROBIC Blood Culture adequate volume   Culture      NO GROWTH < 12 HOURS Performed at [facility], [address], [place], [place] [address]    Report Status PENDING   Urinalysis, w/ Reflex to Culture (Infection Suspected) -Urine, Clean Catch     Status: Abnormal   Collection Time: 05/29/23 11:33 PM  Result Value Ref Range   Specimen Source URINE, CATHETERIZED    Color, Urine YELLOW YELLOW   APPearance HAZY (A) CLEAR   Specific Gravity, Urine 1.011 1.005 - 1.030   pH 6.0 5.0 - 8.0   Glucose, UA NEGATIVE NEGATIVE mg/dL   Hgb urine dipstick NEGATIVE NEGATIVE   Bilirubin Urine NEGATIVE NEGATIVE   Ketones, ur NEGATIVE NEGATIVE mg/dL   Protein, ur >=699 (A) NEGATIVE mg/dL   Nitrite NEGATIVE NEGATIVE   Leukocytes,Ua NEGATIVE NEGATIVE   RBC / HPF 0-5 0 - 5  RBC/hpf   WBC, UA 0-5 0 - 5 WBC/hpf    Comment:        Reflex urine culture not performed if WBC <=10, OR if Squamous epithelial cells >5. If Squamous epithelial cells >5 suggest recollection.    Bacteria, UA RARE (A) NONE SEEN   Squamous Epithelial / HPF 0-5 0 - 5 /HPF    Comment: Performed at [facility], [address] [place], [place] [address]  Resp panel by RT-PCR (RSV, Flu A&B, Covid) Anterior Nasal Swab     Status: Abnormal   Collection Time: 05/29/23 11:33 PM   Specimen: Anterior Nasal Swab  Result Value Ref Range   SARS Coronavirus 2 by RT PCR NEGATIVE NEGATIVE   Influenza A by PCR POSITIVE (A) NEGATIVE   Influenza B by PCR NEGATIVE NEGATIVE    Comment: (NOTE) The Xpert Xpress SARS-CoV-2/FLU/RSV plus assay is intended as an aid in the diagnosis of influenza from Nasopharyngeal swab specimens and should not be used as a sole basis for treatment. Nasal washings and aspirates are unacceptable for Xpert Xpress SARS-CoV-2/FLU/RSV testing.  Fact Sheet for Patients: bloggercourse.com  Fact Sheet for Healthcare Providers: seriousbroker.it  This test is not yet approved or cleared by the [place]  FDA and has been authorized for detection and/or diagnosis of SARS-CoV-2 by FDA under an Emergency Use Authorization (EUA). This EUA will remain in  effect (meaning this test can be used) for the duration of the COVID-19 declaration under Section 564(b)(1) of the Act, 21 U.S.C. section 360bbb-3(b)(1), unless the authorization is terminated or revoked.     Resp Syncytial Virus by PCR NEGATIVE NEGATIVE    Comment: (NOTE) Fact Sheet for Patients: bloggercourse.com  Fact Sheet for Healthcare Providers: seriousbroker.it  This test is not yet approved or cleared by the [place]  FDA and has been authorized for detection and/or diagnosis of SARS-CoV-2 by FDA under an  Emergency Use Authorization (EUA). This EUA will remain in effect (meaning this test can be used) for the duration of the COVID-19 declaration under Section 564(b)(1) of the Act, 21 U.S.C. section 360bbb-3(b)(1), unless the authorization is terminated or revoked.  Performed at [facility], [address], [place], [place] [address]   Brain natriuretic peptide     Status: Abnormal   Collection Time: 05/29/23 11:33 PM  Result Value Ref Range   B Natriuretic Peptide 1,391.9 (H) 0.0 - 100.0 pg/mL    Comment: Performed at [facility], [address], [place], [place] [address]  D-dimer, quantitative     Status: Abnormal   Collection Time: 05/30/23  2:26 AM  Result Value Ref Range   D-Dimer, Quant 1.87 (H) 0.00 - 0.50 ug/mL-FEU    Comment: (NOTE) At the manufacturer cut-off value of 0.5 g/mL FEU, this assay has a negative predictive value of 95-100%.This assay is intended for use in conjunction with a clinical pretest probability (PTP) assessment model to exclude pulmonary embolism (PE) and deep venous thrombosis (DVT) in outpatients suspected of PE or DVT. Results should be correlated with clinical presentation. Performed at [facility], [address] [place] [address] [place], [place] [address]   Strep pneumoniae urinary antigen     Status: None   Collection Time: 05/30/23  3:34 AM  Result Value Ref Range   Strep Pneumo Urinary Antigen NEGATIVE NEGATIVE    Comment:        Infection due to S. pneumoniae cannot be absolutely ruled out since the antigen present may be below the detection limit of the test. Performed at [facility], [address], [place], [place] [address]   Hemoglobin A1c     Status: None   Collection Time: 05/30/23  4:36 AM  Result Value Ref Range   Hgb A1c MFr Bld 5.4 4.8 - 5.6 %    Comment: (NOTE) Pre diabetes:          5.7%-6.4%  Diabetes:              >6.4%  Glycemic control for   <7.0% adults with diabetes    Mean Plasma Glucose  108.28 mg/dL    Comment: Performed at [facility], [address], [place], [place] [address]  CBC with Differential/Platelet     Status: Abnormal   Collection Time: 05/30/23  4:36 AM  Result Value Ref Range   WBC 4.9 4.0 - 10.5 K/uL   RBC 3.36 (L) 3.87 - 5.11 MIL/uL   Hemoglobin 10.4 (L) 12.0 - 15.0 g/dL   HCT 67.4 (L) 63.9 - 53.9 %   MCV 96.7 80.0 - 100.0 fL   MCH 31.0 26.0 - 34.0 pg   MCHC 32.0 30.0 - 36.0 g/dL   RDW 85.1 88.4 - 84.4 %   Platelets 129 (L) 150 - 400 K/uL    Comment: REPEATED TO VERIFY   nRBC 0.0 0.0 - 0.2 %   Neutrophils  Relative % 72 %   Neutro Abs 3.5 1.7 - 7.7 K/uL   Lymphocytes Relative 17 %   Lymphs Abs 0.8 0.7 - 4.0 K/uL   Monocytes Relative 10 %   Monocytes Absolute 0.5 0.1 - 1.0 K/uL   Eosinophils Relative 0 %   Eosinophils Absolute 0.0 0.0 - 0.5 K/uL   Basophils Relative 1 %   Basophils Absolute 0.0 0.0 - 0.1 K/uL   Immature Granulocytes 0 %   Abs Immature Granulocytes 0.02 0.00 - 0.07 K/uL    Comment: Performed at [facility], [address] [place], [place] [address]  Basic metabolic panel     Status: Abnormal   Collection Time: 05/30/23  4:36 AM  Result Value Ref Range   Sodium 139 135 - 145 mmol/L   Potassium 4.0 3.5 - 5.1 mmol/L   Chloride 104 98 - 111 mmol/L   CO2 25 22 - 32 mmol/L   Glucose, Bld 127 (H) 70 - 99 mg/dL    Comment: Glucose reference range applies only to samples taken after fasting for at least 8 hours.   BUN 45 (H) 8 - 23 mg/dL   Creatinine, Ser 7.03 (H) 0.44 - 1.00 mg/dL   Calcium  9.4 8.9 - 10.3 mg/dL   GFR, Estimated 16 (L) >60 mL/min    Comment: (NOTE) Calculated using the CKD-EPI Creatinine Equation (2021)    Anion gap 10 5 - 15    Comment: Performed at [facility], [address], [place], [place] [address]  CBG monitoring, ED     Status: Abnormal   Collection Time: 05/30/23  8:05 AM  Result Value Ref Range   Glucose-Capillary 106 (H) 70 - 99 mg/dL    Comment: Glucose reference range  applies only to samples taken after fasting for at least 8 hours.  Procalcitonin     Status: None   Collection Time: 05/30/23  8:08 AM  Result Value Ref Range   Procalcitonin 0.21 ng/mL    Comment:        Interpretation: PCT (Procalcitonin) <= 0.5 ng/mL: Systemic infection (sepsis) is not likely. Local bacterial infection is possible. (NOTE)       Sepsis PCT Algorithm           Lower Respiratory Tract                                      Infection PCT Algorithm    ----------------------------     ----------------------------         PCT < 0.25 ng/mL                PCT < 0.10 ng/mL          Strongly encourage             Strongly discourage   discontinuation of antibiotics    initiation of antibiotics    ----------------------------     -----------------------------       PCT 0.25 - 0.50 ng/mL            PCT 0.10 - 0.25 ng/mL               OR       >80% decrease in PCT            Discourage initiation of  antibiotics      Encourage discontinuation           of antibiotics    ----------------------------     -----------------------------         PCT >= 0.50 ng/mL              PCT 0.26 - 0.50 ng/mL               AND        <80% decrease in PCT             Encourage initiation of                                             antibiotics       Encourage continuation           of antibiotics    ----------------------------     -----------------------------        PCT >= 0.50 ng/mL                  PCT > 0.50 ng/mL               AND         increase in PCT                  Strongly encourage                                      initiation of antibiotics    Strongly encourage escalation           of antibiotics                                     -----------------------------                                           PCT <= 0.25 ng/mL                                                 OR                                        > 80% decrease in  PCT                                      Discontinue / Do not initiate                                             antibiotics  Performed at [facility], [address] [place], [place] [address]   Lactic acid, plasma     Status: None  Collection Time: 05/30/23  8:08 AM  Result Value Ref Range   Lactic Acid, Venous 1.4 0.5 - 1.9 mmol/L    Comment: Performed at [facility], [address], [place], [place] [address]  C-reactive protein     Status: Abnormal   Collection Time: 05/30/23  8:08 AM  Result Value Ref Range   CRP 2.4 (H) <1.0 mg/dL    Comment: Performed at [facility], [address] [place], [place] [address]  CBG monitoring, ED     Status: Abnormal   Collection Time: 05/30/23 12:12 PM  Result Value Ref Range   Glucose-Capillary 148 (H) 70 - 99 mg/dL    Comment: Glucose reference range applies only to samples taken after fasting for at least 8 hours.  Lactic acid, plasma     Status: None   Collection Time: 05/30/23 12:16 PM  Result Value Ref Range   Lactic Acid, Venous 1.5 0.5 - 1.9 mmol/L    Comment: Performed at [facility], [address] [place], [place] [address]  CBG monitoring, ED     Status: Abnormal   Collection Time: 05/30/23  5:24 PM  Result Value Ref Range   Glucose-Capillary 109 (H) 70 - 99 mg/dL    Comment: Glucose reference range applies only to samples taken after fasting for at least 8 hours.   NM Pulmonary Perfusion Result Date: 05/30/2023 CLINICAL DATA:  Pulmonary embolism (PE) suspected, high prob. Shortness of breath EXAM: NUCLEAR MEDICINE PERFUSION LUNG SCAN TECHNIQUE: Perfusion images were obtained in multiple projections after intravenous injection of radiopharmaceutical. Ventilation scans intentionally deferred if perfusion scan and chest x-ray adequate for interpretation during COVID 19 epidemic. RADIOPHARMACEUTICALS:  4.4 mCi Tc-64m MAA IV COMPARISON:  Chest x-ray 05/29/2023 FINDINGS: No segmental or subsegmental  perfusion defects to suggest pulmonary embolus. IMPRESSION: No evidence of pulmonary embolus. Electronically Signed   By: Franky Crease M.D.   On: 05/30/2023 10:42   CT ABDOMEN PELVIS WO CONTRAST Result Date: 05/30/2023 CLINICAL DATA:  LLQ abdominal pain Diverticulitis, complication suspected GFR 15, LLQ abd pain EXAM: CT ABDOMEN AND PELVIS WITHOUT CONTRAST TECHNIQUE: Multidetector CT imaging of the abdomen and pelvis was performed following the standard protocol without IV contrast. RADIATION DOSE REDUCTION: This exam was performed according to the departmental dose-optimization program which includes automated exposure control, adjustment of the mA and/or kV according to patient size and/or use of iterative reconstruction technique. COMPARISON:  CT abdomen pelvis 03/24/2023 cholecystic x-ray 05/29/2023 FINDINGS: Lower chest: Right lower lobe peribronchovascular patchy airspace opacity. Cardiac lead noted. Hepatobiliary: No focal liver abnormality. No gallstones, gallbladder wall thickening, or pericholecystic fluid. No biliary dilatation. Pancreas: No focal lesion. Normal pancreatic contour. No surrounding inflammatory changes. No main pancreatic ductal dilatation. Spleen: Normal in size without focal abnormality. Adrenals/Urinary Tract: No adrenal nodule bilaterally. No nephrolithiasis and no hydronephrosis. Fluid density lesions of the bilateral kidneys likely represent simple renal cysts. Simple renal cysts, in the absence of clinically indicated signs/symptoms, require no independent follow-up. No ureterolithiasis or hydroureter. The urinary bladder is unremarkable. Stomach/Bowel: Stomach is within normal limits. No evidence of bowel wall thickening or dilatation. Colonic diverticulosis. Appendix appears normal. Vascular/Lymphatic: No abdominal aorta or iliac aneurysm. Mild atherosclerotic plaque of the aorta and its branches. No abdominal, pelvic, or inguinal lymphadenopathy. Reproductive: Subcentimeter  calcified intramural uterine lesions consistent with uterine fibroids. Otherwise uterus and bilateral adnexa are unremarkable. Other: No intraperitoneal free fluid. No intraperitoneal free gas. No organized fluid collection. Musculoskeletal: No abdominal wall  hernia or abnormality. Tiny fat containing umbilical hernia. No suspicious lytic or blastic osseous lesions. No acute displaced fracture. L4-L5 and L5-1 inter vertebral disc space vacuum phenomenon. IMPRESSION: 1. Right lower lobe pneumonia. Followup PA and lateral chest X-ray is recommended in 3-4 weeks following therapy to ensure resolution and exclude underlying malignancy. 2. Colonic diverticulosis with no acute diverticulitis. 3. Uterine fibroids. 4.  Aortic Atherosclerosis (ICD10-I70.0). Electronically Signed   By: Morgane  Naveau M.D.   On: 05/30/2023 01:19   CT HEAD WO CONTRAST ( ) Result Date: 05/30/2023 CLINICAL DATA:  Mental status change, unknown cause She is altered, but it is intermittent. A/O x2. EXAM: CT HEAD WITHOUT CONTRAST TECHNIQUE: Contiguous axial images were obtained from the base of the skull through the vertex without intravenous contrast. RADIATION DOSE REDUCTION: This exam was performed according to the departmental dose-optimization program which includes automated exposure control, adjustment of the mA and/or kV according to patient size and/or use of iterative reconstruction technique. COMPARISON:  None Available. FINDINGS: Brain: Patchy and confluent areas of decreased attenuation are noted throughout the deep and periventricular white matter of the cerebral hemispheres bilaterally, compatible with chronic microvascular ischemic disease. Chronic appearing right cerebral lacunar infarction versus dilatation of the pre-vascular space (5:16). No evidence of large-territorial acute infarction. No parenchymal hemorrhage. No mass lesion. No extra-axial collection. No mass effect or midline shift. No hydrocephalus. Basilar cisterns  are patent. Vascular: No hyperdense vessel. Atherosclerotic calcifications are present within the cavernous internal carotid arteries. Skull: No acute fracture or focal lesion. Sinuses/Orbits: Paranasal sinuses and mastoid air cells are clear. Bilateral lens replacement. Otherwise the orbits are unremarkable. Other: None. IMPRESSION: No acute intracranial abnormality. Electronically Signed   By: Morgane  Naveau M.D.   On: 05/30/2023 01:09   DG Chest Portable 1 View Result Date: 05/29/2023 CLINICAL DATA:  Sepsis altered EXAM: PORTABLE CHEST 1 VIEW COMPARISON:  03/15/2018 FINDINGS: Left-sided pacing device as before. Cardiomegaly with vascular congestion. Possible trace right effusion. No consolidation or pneumothorax. Aortic atherosclerosis IMPRESSION: Cardiomegaly with vascular congestion and possible trace right effusion. Electronically Signed   By: Luke Bun M.D.   On: 05/29/2023 23:22    Pending Labs Unresulted Labs (From admission, onward)     Start     Ordered   05/31/23 0500  CBC with Differential/Platelet  Tomorrow morning,   R        05/30/23 0503   05/31/23 0500  Comprehensive metabolic panel  Tomorrow morning,   R        05/30/23 1911   05/30/23 0334  Expectorated Sputum Assessment w Gram Stain, Rflx to Resp Cult  Once,   R        05/30/23 0335   05/30/23 0334  Legionella Pneumophila Serogp 1 Ur Ag  Once,   R        05/30/23 0335            Vitals/Pain Today's Vitals   05/30/23 1710 05/30/23 1714 05/30/23 2012 05/30/23 2030  BP: (!) 104/57   (!) 129/57  Pulse: (!) 58   64  Resp: 18   19  Temp:  98.8 F (37.1 C)    TempSrc:  Oral    SpO2: 99%   91%  Weight:   98.8 kg   Height:   5' 2 (1.575 m)   PainSc:        Isolation Precautions No active isolations  Medications Medications  benzonatate  (TESSALON ) capsule 100 mg (100 mg Oral Not Given 05/30/23 1720)  allopurinol  (ZYLOPRIM ) tablet 100 mg (100 mg Oral Given 05/30/23 1108)  apixaban  (ELIQUIS ) tablet 5 mg (5  mg Oral Given 05/30/23 1109)  rosuvastatin  (CRESTOR ) tablet 10 mg (10 mg Oral Given 05/30/23 1108)  insulin  aspart (novoLOG ) injection 0-15 Units ( Subcutaneous Not Given 05/30/23 1744)  insulin  aspart (novoLOG ) injection 0-5 Units (has no administration in time range)  carvedilol  (COREG ) tablet 25 mg (25 mg Oral Given 05/30/23 1108)  oseltamivir  (TAMIFLU ) capsule 30 mg (30 mg Oral Given 05/30/23 0801)  cefTRIAXone  (ROCEPHIN ) 2 g in sodium chloride  0.9 % 100 mL IVPB (has no administration in time range)  doxycycline  (VIBRAMYCIN ) 100 mg in dextrose  5 % 250 mL IVPB (0 mg Intravenous Stopped 05/30/23 1328)  acetaminophen  (TYLENOL ) tablet 650 mg (650 mg Oral Given 05/30/23 1233)    Or  acetaminophen  (TYLENOL ) suppository 650 mg ( Rectal See Alternative 05/30/23 1233)  chlorpheniramine-HYDROcodone  (TUSSIONEX) 10-8 MG/5ML suspension 2.5 mL (2.5 mLs Oral Given 05/30/23 1741)  guaiFENesin  (MUCINEX ) 12 hr tablet 1,200 mg (has no administration in time range)  acetaminophen  (TYLENOL ) tablet 650 mg (650 mg Oral Given 05/29/23 2351)  fentaNYL  (SUBLIMAZE ) injection 50 mcg (50 mcg Intravenous Given 05/30/23 0112)  cefTRIAXone  (ROCEPHIN ) 1 g in sodium chloride  0.9 % 100 mL IVPB (0 g Intravenous Stopped 05/30/23 0217)  azithromycin  (ZITHROMAX ) 500 mg in sodium chloride  0.9 % 250 mL IVPB (0 mg Intravenous Stopped 05/30/23 0426)  oseltamivir  (TAMIFLU ) capsule 75 mg (75 mg Oral Given 05/30/23 0143)  furosemide  (LASIX ) injection 60 mg (60 mg Intravenous Given 05/30/23 0810)  technetium albumin  aggregated (MAA) injection solution 4.4 millicurie (4.4 millicuries Intravenous Contrast Given 05/30/23 1026)    Mobility walks     Focused Assessments Cardiac Assessment Handoff:    Lab Results  Component Value Date   CKTOTAL 328 (H) 09/23/2009   CKMB 1.1 09/23/2009   TROPONINI 0.04 (HH) 12/10/2017   Lab Results  Component Value Date   DDIMER 1.87 (H) 05/30/2023   Does the Patient currently have chest pain? No     R Recommendations: See Admitting Provider Note  Report given to:   Additional Notes:

## 2023-05-30 NOTE — H&P (Addendum)
 PCP:   Seabron Lenis, MD   Chief Complaint:  Shortness of breath, fever  HPI: This is a 73 year old female with past medical history of OSA on CPAP, HTN, CKD stage IV, ILD, factor V Leyden, HFrEF.  Per patient on Sunday she became ill.  She has had fever, chills, nausea, vomiting, diarrhea, cough, shortness of breath, wheezing.  She states she has been lightheaded and dizzy.  She believes she may have passed out a few times while on the phone with her daughter.  She states she just could not stay awake.  She endorses abdominal pain, myalgia and coldness.  She denies any sick contacts.  Her daughter called the ambulance today.  Patient states her diarrhea has resolved.  In the ER patient's vitals 138/96, 93, 17, Tmax 103.2.  BNP 1391 [previous BNP done in 2019], WBCs 4.9, platelets 129, D-dimer elevated 1.87, INR 1.5, influenza A positive.  Patient hypoxic to 86%.  Currently on 2.5 L CT abdomen pelvis right lower lobe pneumonia CXR: Cardiomegaly with vascular congestion Blood cultures x 2 collected.  Tylenol  given.  Tamiflu , Rocephin  and azithromycin  given.  Review of Systems:  Per HPI  Past Medical History: Past Medical History:  Diagnosis Date   ANEMIA-NOS    BREAST CANCER, HX OF 1994 & 1995   1994>>recurrence in 1995, R breast, s/p lumpectomy>>mastectomy   Cancer (HCC)    Cardiomyopathy, nonischemic (HCC)    CHF (congestive heart failure) (HCC)    chronic systolic and diastolic CHF   CHRONIC KIDNEY DISEASE STAGE III (MODERATE)    Clotting disorder (HCC)    on eliquis  for Factor 5 disorder   Colon polyps    DIABETES MELLITUS, TYPE II    Diverticulosis    HYPERLIPIDEMIA    HYPERTENSION    ILD (interstitial lung disease) (HCC)    10/2018 CT findings suggestive of sarcoidosis; followed by Dr. Jude   OBSTRUCTIVE SLEEP APNEA    CPAP   Personal history of chemotherapy    Proteinuria    PULMONARY EMBOLISM 09/24/2008   anticoag thru 03/2010   Rotator cuff tear    Left   Shoulder  impingement, left    Sleep apnea    Wears CPAP nightly   Past Surgical History:  Procedure Laterality Date   BREAST SURGERY     CARDIAC CATHETERIZATION N/A 11/02/2015   Procedure: Right/Left Heart Cath and Coronary Angiography;  Surgeon: Lonni JONETTA Cash, MD;  Location: [facility] INVASIVE CV LAB;  Service: Cardiovascular;  Laterality: N/A;   DILATION AND CURETTAGE OF UTERUS     ICD IMPLANT N/A 03/14/2018   Procedure: ICD IMPLANT;  Surgeon: Francyne Headland, MD;  Location: [facility] INVASIVE CV LAB;  Service: Cardiovascular;  Laterality: N/A;   insertion port a cath     MASTECTOMY Right 1995   Right   PORT-A-CATH REMOVAL     SHOULDER ARTHROSCOPY WITH ROTATOR CUFF REPAIR AND SUBACROMIAL DECOMPRESSION Left 01/24/2019   Procedure: SHOULDER ARTHROSCOPY WITH ROTATOR CUFF REPAIR AND SUBACROMIAL DECOMPRESSION;  Surgeon: Sharl Selinda Dover, MD;  Location: [facility] OR;  Service: Orthopedics;  Laterality: Left;  2.5 hrs    Medications: Prior to Admission medications   Medication Sig Start Date End Date Taking? Authorizing Provider  acetaminophen  (TYLENOL ) 500 MG tablet Take 500 mg by mouth every 6 (six) hours as needed for mild pain. Patient not taking: Reported on 09/01/2022    [provider]  allopurinol  (ZYLOPRIM ) 100 MG tablet Take 100 mg by mouth 2 (two) times daily.  [provider]  amoxicillin -clavulanate (AUGMENTIN ) 500-125 MG tablet Take 1 tablet by mouth every 12 (twelve) hours. 03/24/23   Desiderio Chew, PA-C  Blood Glucose Monitoring Suppl (TRUE METRIX AIR GLUCOSE METER) w/Device KIT  11/21/16   [provider]  carvedilol  (COREG ) 25 MG tablet TAKE 1 TABLET BY MOUTH TWICE  DAILY 02/08/23   Croitoru, Mihai, MD  Cholecalciferol (VITAMIN D-3) 5000 units TABS Take 5,000 Units by mouth daily.     [provider]  cholecalciferol (VITAMIN D3) 25 MCG (1000 UNIT) tablet Take 1,000 Units by mouth daily. 02/11/22   [provider]  diphenhydrAMINE  (BENADRYL ) 25 MG  tablet Take 25 mg by mouth daily.    [provider]  ELIQUIS  5 MG TABS tablet TAKE 1 TABLET TWICE DAILY 05/09/19   Timmy Maude SAUNDERS, MD  ferrous sulfate  325 (65 FE) MG tablet Take 325 mg by mouth daily with breakfast.    [provider]  Fluocinolone  Acetonide 0.01 % OIL Place 5 drops in ear(s) daily. 06/15/21   Joesph Shaver Scales, PA-C  furosemide  (LASIX ) 80 MG tablet Take 80 mg by mouth daily. 05/23/22   [provider]  hydrALAZINE  (APRESOLINE ) 10 MG tablet TAKE 1 TABLET BY MOUTH 3 TIMES  DAILY 06/27/22   Croitoru, Jerel, MD  isosorbide  mononitrate (IMDUR ) 30 MG 24 hr tablet TAKE 1 TABLET BY MOUTH DAILY 05/22/23   Croitoru, Jerel, MD  oxyCODONE  (OXY IR/ROXICODONE ) 5 MG immediate release tablet Take 1 tablet (5 mg total) by mouth every 6 (six) hours as needed for severe pain (pain score 7-10). 03/24/23   Geiple, Joshua, PA-C  rosuvastatin  (CRESTOR ) 10 MG tablet TAKE 1 TABLET BY MOUTH DAILY 04/21/23   Croitoru, Mihai, MD  vitamin B-12 (CYANOCOBALAMIN) 1000 MCG tablet Take 1,000 mcg by mouth daily.    [provider]  vitamin E 400 UNIT capsule Take 400 Units by mouth daily.    [provider]  fluticasone  (FLONASE ) 50 MCG/ACT nasal spray Place 2 sprays into the nose daily. 12/16/10 08/12/19  Inocencio Berwyn LABOR, MD    Allergies:  No Known Allergies  Social History:  reports that she has never smoked. She has never used smokeless tobacco. She reports current alcohol  use. She reports that she does not use drugs.  Family History: Family History  Problem Relation Age of Onset   Parkinson's disease Mother    Asthma Father    Diabetes Sister    Colon cancer Neg Hx    Stomach cancer Neg Hx    Esophageal cancer Neg Hx    Rectal cancer Neg Hx    Liver cancer Neg Hx     Physical Exam: Vitals:   05/29/23 2246 05/29/23 2252 05/30/23 0051  BP: (!) 138/96    Pulse: 93    Resp: 17 (!) 30   Temp: (!) 103.2 F (39.6 C)  99.9 F (37.7 C)  TempSrc: Oral   Oral  SpO2: 93%      General: Alert and oriented x 3, ill weak female  Eyes: Pink conjunctiva, no scleral icterus ENT: Dry oral mucosa, neck supple, no thyromegaly Lungs: clear to ascultation, no wheeze, no use of assessor muscles Cardiovascular: RRR, no regurgitation, no murmurs.  No JVD Abdomen: soft, positive BS, NTND, no organomegaly, not an acute abdomen GU: not examined Neuro: CN II - XII grossly intact, sensation intact Musculoskeletal: strength 5/5 all extremities, no edema Skin: no rash, no subcutaneous crepitation, no decubitus Psych: appropriate patient   Labs on Admission:  Recent Labs    05/29/23 2302  NA 136  K 4.3  CL 101  CO2 21*  GLUCOSE 136*  BUN 44*  CREATININE 3.14*  CALCIUM  10.1   Recent Labs    05/29/23 2302  AST 30  ALT 15  ALKPHOS 104  BILITOT 1.1  PROT 7.9  ALBUMIN  3.9    Recent Labs    05/29/23 2302  WBC 6.7  NEUTROABS 5.2  HGB 12.1  HCT 37.7  MCV 95.7  PLT 150    Micro Results: Recent Results (from the past 240 hours)  Resp panel by RT-PCR (RSV, Flu A&B, Covid) Anterior Nasal Swab     Status: Abnormal   Collection Time: 05/29/23 11:33 PM   Specimen: Anterior Nasal Swab  Result Value Ref Range Status   SARS Coronavirus 2 by RT PCR NEGATIVE NEGATIVE Final   Influenza A by PCR POSITIVE (A) NEGATIVE Final   Influenza B by PCR NEGATIVE NEGATIVE Final    Comment: (NOTE) The Xpert Xpress SARS-CoV-2/FLU/RSV plus assay is intended as an aid in the diagnosis of influenza from Nasopharyngeal swab specimens and should not be used as a sole basis for treatment. Nasal washings and aspirates are unacceptable for Xpert Xpress SARS-CoV-2/FLU/RSV testing.  Fact Sheet for Patients: bloggercourse.com  Fact Sheet for Healthcare Providers: seriousbroker.it  This test is not yet approved or cleared by the [place]  FDA and has been authorized for detection and/or diagnosis of SARS-CoV-2  by FDA under an Emergency Use Authorization (EUA). This EUA will remain in effect (meaning this test can be used) for the duration of the COVID-19 declaration under Section 564(b)(1) of the Act, 21 U.S.C. section 360bbb-3(b)(1), unless the authorization is terminated or revoked.     Resp Syncytial Virus by PCR NEGATIVE NEGATIVE Final    Comment: (NOTE) Fact Sheet for Patients: bloggercourse.com  Fact Sheet for Healthcare Providers: seriousbroker.it  This test is not yet approved or cleared by the [place]  FDA and has been authorized for detection and/or diagnosis of SARS-CoV-2 by FDA under an Emergency Use Authorization (EUA). This EUA will remain in effect (meaning this test can be used) for the duration of the COVID-19 declaration under Section 564(b)(1) of the Act, 21 U.S.C. section 360bbb-3(b)(1), unless the authorization is terminated or revoked.  Performed at [facility], [address], [place], [place] [address]      Radiological Exams on Admission: CT ABDOMEN PELVIS WO CONTRAST Result Date: 05/30/2023 CLINICAL DATA:  LLQ abdominal pain Diverticulitis, complication suspected GFR 15, LLQ abd pain EXAM: CT ABDOMEN AND PELVIS WITHOUT CONTRAST TECHNIQUE: Multidetector CT imaging of the abdomen and pelvis was performed following the standard protocol without IV contrast. RADIATION DOSE REDUCTION: This exam was performed according to the departmental dose-optimization program which includes automated exposure control, adjustment of the mA and/or kV according to patient size and/or use of iterative reconstruction technique. COMPARISON:  CT abdomen pelvis 03/24/2023 cholecystic x-ray 05/29/2023 FINDINGS: Lower chest: Right lower lobe peribronchovascular patchy airspace opacity. Cardiac lead noted. Hepatobiliary: No focal liver abnormality. No gallstones, gallbladder wall thickening, or pericholecystic fluid. No biliary  dilatation. Pancreas: No focal lesion. Normal pancreatic contour. No surrounding inflammatory changes. No main pancreatic ductal dilatation. Spleen: Normal in size without focal abnormality. Adrenals/Urinary Tract: No adrenal nodule bilaterally. No nephrolithiasis and no hydronephrosis. Fluid density lesions of the bilateral kidneys likely represent simple renal cysts. Simple renal cysts, in the absence of clinically indicated signs/symptoms, require no independent follow-up. No ureterolithiasis or hydroureter. The urinary  bladder is unremarkable. Stomach/Bowel: Stomach is within normal limits. No evidence of bowel wall thickening or dilatation. Colonic diverticulosis. Appendix appears normal. Vascular/Lymphatic: No abdominal aorta or iliac aneurysm. Mild atherosclerotic plaque of the aorta and its branches. No abdominal, pelvic, or inguinal lymphadenopathy. Reproductive: Subcentimeter calcified intramural uterine lesions consistent with uterine fibroids. Otherwise uterus and bilateral adnexa are unremarkable. Other: No intraperitoneal free fluid. No intraperitoneal free gas. No organized fluid collection. Musculoskeletal: No abdominal wall hernia or abnormality. Tiny fat containing umbilical hernia. No suspicious lytic or blastic osseous lesions. No acute displaced fracture. L4-L5 and L5-1 inter vertebral disc space vacuum phenomenon. IMPRESSION: 1. Right lower lobe pneumonia. Followup PA and lateral chest X-ray is recommended in 3-4 weeks following therapy to ensure resolution and exclude underlying malignancy. 2. Colonic diverticulosis with no acute diverticulitis. 3. Uterine fibroids. 4.  Aortic Atherosclerosis (ICD10-I70.0). Electronically Signed   By: Morgane  Naveau M.D.   On: 05/30/2023 01:19   CT HEAD WO CONTRAST ( ) Result Date: 05/30/2023 CLINICAL DATA:  Mental status change, unknown cause She is altered, but it is intermittent. A/O x2. EXAM: CT HEAD WITHOUT CONTRAST TECHNIQUE: Contiguous axial  images were obtained from the base of the skull through the vertex without intravenous contrast. RADIATION DOSE REDUCTION: This exam was performed according to the departmental dose-optimization program which includes automated exposure control, adjustment of the mA and/or kV according to patient size and/or use of iterative reconstruction technique. COMPARISON:  None Available. FINDINGS: Brain: Patchy and confluent areas of decreased attenuation are noted throughout the deep and periventricular white matter of the cerebral hemispheres bilaterally, compatible with chronic microvascular ischemic disease. Chronic appearing right cerebral lacunar infarction versus dilatation of the pre-vascular space (5:16). No evidence of large-territorial acute infarction. No parenchymal hemorrhage. No mass lesion. No extra-axial collection. No mass effect or midline shift. No hydrocephalus. Basilar cisterns are patent. Vascular: No hyperdense vessel. Atherosclerotic calcifications are present within the cavernous internal carotid arteries. Skull: No acute fracture or focal lesion. Sinuses/Orbits: Paranasal sinuses and mastoid air cells are clear. Bilateral lens replacement. Otherwise the orbits are unremarkable. Other: None. IMPRESSION: No acute intracranial abnormality. Electronically Signed   By: Morgane  Naveau M.D.   On: 05/30/2023 01:09   DG Chest Portable 1 View Result Date: 05/29/2023 CLINICAL DATA:  Sepsis altered EXAM: PORTABLE CHEST 1 VIEW COMPARISON:  03/15/2018 FINDINGS: Left-sided pacing device as before. Cardiomegaly with vascular congestion. Possible trace right effusion. No consolidation or pneumothorax. Aortic atherosclerosis IMPRESSION: Cardiomegaly with vascular congestion and possible trace right effusion. Electronically Signed   By: Luke Bun M.D.   On: 05/29/2023 23:22    Assessment/Plan Present on Admission:  Acute metabolic encephalopathy  Sepsis secondary to Influenza A and pneumonia  Acute  respiratory failure on 2.5L -Blood cultures collected. -Pneumonia order set initiated.  Sputum, Legionella and Streptococcus urinary antigen ordered -IV Rocephin  and doxycycline  ordered -Tamiflu  initiated -Continue oxygen  keep sats greater than 88% -Nebulizes as needed -Lactic acid, procalcitonin, CRP ordered   Acute HFrEF -Lasix  80 mg x 1 given -Strict I's and O's, daily weights -Patient does not appear clinically fluid overloaded -ICD (implantable cardioverter-defibrillator) in place -Check magnesium level   Factor V Leiden (HCC) //  history of popliteal DVT (deep venous thrombosis)  -Patient has not taken her medication since Sunday.  Concern for PE.  Elevated D-dimer. -VQ scan ordered by EDP given patient's CKD stage IV. -Eliquis  resumed, first dose now   Gout -Allopurinol  resumed   HLD -Crestor  resumed   T2DM -Sliding scale  insulin  ordered   Benign essential HTN -Coreg  resumed -Imdur , hydralazine  on hold as patient has patient SBP in the 100s   CKD (chronic kidney disease), stage IV (HCC) -Resume Renvela.  Strict I's and O's -   ILD (interstitial lung disease) (HCC) -DuoNebs as needed   OSA (obstructive sleep apnea) -CPAP ordered   Yichen Gilardi 05/30/2023, 2:13 AM

## 2023-05-31 DIAGNOSIS — G9341 Metabolic encephalopathy: Secondary | ICD-10-CM

## 2023-05-31 DIAGNOSIS — I10 Essential (primary) hypertension: Secondary | ICD-10-CM

## 2023-05-31 DIAGNOSIS — J09X1 Influenza due to identified novel influenza A virus with pneumonia: Secondary | ICD-10-CM | POA: Diagnosis not present

## 2023-05-31 DIAGNOSIS — I5021 Acute systolic (congestive) heart failure: Secondary | ICD-10-CM | POA: Diagnosis not present

## 2023-05-31 LAB — COMPREHENSIVE METABOLIC PANEL
ALT: 18 U/L (ref 0–44)
AST: 53 U/L — ABNORMAL HIGH (ref 15–41)
Albumin: 3 g/dL — ABNORMAL LOW (ref 3.5–5.0)
Alkaline Phosphatase: 73 U/L (ref 38–126)
Anion gap: 10 (ref 5–15)
BUN: 53 mg/dL — ABNORMAL HIGH (ref 8–23)
CO2: 22 mmol/L (ref 22–32)
Calcium: 8.8 mg/dL — ABNORMAL LOW (ref 8.9–10.3)
Chloride: 102 mmol/L (ref 98–111)
Creatinine, Ser: 3.14 mg/dL — ABNORMAL HIGH (ref 0.44–1.00)
GFR, Estimated: 15 mL/min — ABNORMAL LOW (ref >60)
Glucose, Bld: 131 mg/dL — ABNORMAL HIGH (ref 70–99)
Potassium: 3.7 mmol/L (ref 3.5–5.1)
Sodium: 134 mmol/L — ABNORMAL LOW (ref 135–145)
Total Bilirubin: 0.6 mg/dL (ref 0.0–1.2)
Total Protein: 6 g/dL — ABNORMAL LOW (ref 6.5–8.1)

## 2023-05-31 LAB — CBC WITH DIFFERENTIAL/PLATELET
Abs Immature Granulocytes: 0.01 10*3/uL (ref 0.00–0.07)
Basophils Absolute: 0 10*3/uL (ref 0.0–0.1)
Basophils Relative: 1 %
Eosinophils Absolute: 0 10*3/uL (ref 0.0–0.5)
Eosinophils Relative: 0 %
HCT: 30.4 % — ABNORMAL LOW (ref 36.0–46.0)
Hemoglobin: 9.8 g/dL — ABNORMAL LOW (ref 12.0–15.0)
Immature Granulocytes: 0 %
Lymphocytes Relative: 20 %
Lymphs Abs: 0.8 10*3/uL (ref 0.7–4.0)
MCH: 30.8 pg (ref 26.0–34.0)
MCHC: 32.2 g/dL (ref 30.0–36.0)
MCV: 95.6 fL (ref 80.0–100.0)
Monocytes Absolute: 0.3 10*3/uL (ref 0.1–1.0)
Monocytes Relative: 8 %
Neutro Abs: 2.6 10*3/uL (ref 1.7–7.7)
Neutrophils Relative %: 71 %
Platelets: 113 10*3/uL — ABNORMAL LOW (ref 150–400)
RBC: 3.18 MIL/uL — ABNORMAL LOW (ref 3.87–5.11)
RDW: 14.5 % (ref 11.5–15.5)
WBC: 3.7 10*3/uL — ABNORMAL LOW (ref 4.0–10.5)
nRBC: 0 % (ref 0.0–0.2)

## 2023-05-31 LAB — GLUCOSE, CAPILLARY
Glucose-Capillary: 114 mg/dL — ABNORMAL HIGH (ref 70–99)
Glucose-Capillary: 116 mg/dL — ABNORMAL HIGH (ref 70–99)

## 2023-05-31 NOTE — Plan of Care (Signed)
  Problem: Education: Goal: Ability to describe self-care measures that may prevent or decrease complications (Diabetes Survival Skills Education) will improve Outcome: Progressing Goal: Individualized Educational Video(s) Outcome: Progressing   Problem: Coping: Goal: Ability to adjust to condition or change in health will improve Outcome: Progressing   Problem: Fluid Volume: Goal: Ability to maintain a balanced intake and output will improve Outcome: Progressing   Problem: Health Behavior/Discharge Planning: Goal: Ability to identify and utilize available resources and services will improve Outcome: Progressing Goal: Ability to manage health-related needs will improve Outcome: Progressing   Problem: Nutritional: Goal: Maintenance of adequate nutrition will improve Outcome: Progressing Goal: Progress toward achieving an optimal weight will improve Outcome: Progressing   Problem: Skin Integrity: Goal: Risk for impaired skin integrity will decrease Outcome: Progressing   Problem: Tissue Perfusion: Goal: Adequacy of tissue perfusion will improve Outcome: Progressing

## 2023-05-31 NOTE — Evaluation (Signed)
 Physical Therapy Evaluation and Discharge  Patient Details Name: Patricia Morgan MRN: [id number] DOB: 13-Apr-1951 Today's Date: 05/31/2023  History of Present Illness  Patient is a 73 year old female with influenza A with pneumonia. History of OSA on CPAP, HTN, CKD stage IV, ILD, factor V Leyden, HFrEF.  Clinical Impression  Patient is agreeable to PT evaluation. She is independent at baseline and lives alone in an apartment with elevator entry.  Today, the patient is Mod I for bed mobility. Supervision provided for transfers and hallway ambulation. Patient walked in the hallway without assistive device with no loss of balance. Two short distance rest breaks taken with Sp02 90% or higher on room air. Discussed energy conservation strategies to use in home setting and progressing activity slowly. Anticipate patient can return to previous environment. No further PT needs at this time. Recommend routine mobility with staff and mobility specialist supervision.     If plan is discharge home, recommend the following: Assist for transportation   Can travel by private vehicle        Equipment Recommendations None recommended by PT  Recommendations for Other Services       Functional Status Assessment Patient has not had a recent decline in their functional status     Precautions / Restrictions Precautions Precautions: Fall Restrictions Weight Bearing Restrictions Per Provider Order: No      Mobility  Bed Mobility Overal bed mobility: Modified Independent                  Transfers Overall transfer level: Needs assistance Equipment used: None Transfers: Sit to/from Stand Sit to Stand: Supervision                Ambulation/Gait Ambulation/Gait assistance: Supervision Gait Distance (Feet): 175 Feet Assistive device: None Gait Pattern/deviations: Step-through pattern Gait velocity: decreased     General Gait Details: 2 short distance rest breaks. S002 remained in  the low 90's on room air. encouraged energy conservation techniques  Stairs Stairs:  (not attempted due to IV running. patient reports stairs are optional at her apartment and usually completed for exercise only)          Wheelchair Mobility     Tilt Bed    Modified Rankin (Stroke Patients Only)       Balance Overall balance assessment: Needs assistance Sitting-balance support: Feet supported Sitting balance-Leahy Scale: Good     Standing balance support: No upper extremity supported Standing balance-Leahy Scale: Fair                               Pertinent Vitals/Pain Pain Assessment Pain Assessment: No/denies pain    Home Living Family/patient expects to be discharged to:: Private residence Living Arrangements: Alone Available Help at Discharge: Family;Friend(s);Available PRN/intermittently Type of Home: Apartment Home Access: Elevator;Stairs to enter Entrance Stairs-Rails: Can reach both Entrance Stairs-Number of Steps: 13   Home Layout: One level Home Equipment: Hand held shower head Additional Comments: One fall on Saturday, blackout episodes    Prior Function Prior Level of Function : History of Falls (last six months)             Mobility Comments: independent without assistive device       Extremity/Trunk Assessment   Upper Extremity Assessment Upper Extremity Assessment: Overall WFL for tasks assessed    Lower Extremity Assessment Lower Extremity Assessment: Overall WFL for tasks assessed       Communication  Communication Communication: No apparent difficulties  Cognition Arousal: Alert Behavior During Therapy: WFL for tasks assessed/performed Overall Cognitive Status: Within Functional Limits for tasks assessed                                          General Comments General comments (skin integrity, edema, etc.): Sp02 >91% on RA during functional activity    Exercises     Assessment/Plan     PT Assessment Patient does not need any further PT services  PT Problem List         PT Treatment Interventions      PT Goals (Current goals can be found in the Care Plan section)  Acute Rehab PT Goals PT Goal Formulation: All assessment and education complete, DC therapy    Frequency       Co-evaluation               AM-PAC PT "6 Clicks" Mobility  Outcome Measure Help needed turning from your back to your side while in a flat bed without using bedrails?: None Help needed moving from lying on your back to sitting on the side of a flat bed without using bedrails?: None Help needed moving to and from a bed to a chair (including a wheelchair)?: A Little Help needed standing up from a chair using your arms (e.g., wheelchair or bedside chair)?: A Little Help needed to walk in hospital room?: A Little Help needed climbing 3-5 steps with a railing? : A Little 6 Click Score: 20    End of Session   Activity Tolerance: Patient tolerated treatment well Patient left: in bed;with call bell/phone within reach   PT Visit Diagnosis: Muscle weakness (generalized) (M62.81)    Time: 2956-2130 PT Time Calculation (min) (ACUTE ONLY): 20 min   Charges:   PT Evaluation $PT Eval Low Complexity: 1 Low   PT General Charges $$ ACUTE PT VISIT: 1 Visit         Ozie Bo, PT, MPT   Erlene Hawks 05/31/2023, 12:56 PM

## 2023-05-31 NOTE — Evaluation (Signed)
 Occupational Therapy Evaluation and DC Summary  Patient Details Name: Patricia Morgan MRN: [id number] DOB: 11/26/1950 Today's Date: 05/31/2023   History of Present Illness Pt is a 73 yo female presenting to [facility] ED on 05/28/22 with c/o fever, chills, n/v/d, and SOB. Workup found positive for Influenza A and CR chest demonstrated R LL pneumonia. PMH significant for OSA on CPAP, HTN, CKD IV, factor V Leiden, HFrE, DM II, CHF, L rotator repair.   Clinical Impression   Pt admitted for above, she reports feeling much better today than on admission. Pt ambulatory in hall no AD with supervision and some mild unsteadiness, she reports not much troubles with ADLs except needing min A for her socks. Rising from low surfaces presents somewhat of a challenge for pt, rec BSC at home. Pt has no further acute skilled OT needs. No post acute OT recommended       If plan is discharge home, recommend the following: Other (comment) (prn)    Functional Status Assessment  Patient has not had a recent decline in their functional status  Equipment Recommendations  BSC/3in1    Recommendations for Other Services       Precautions / Restrictions Precautions Precautions: Fall Restrictions Weight Bearing Restrictions Per Provider Order: No      Mobility Bed Mobility               General bed mobility comments: Pt up in chair on arrival    Transfers Overall transfer level: Needs assistance Equipment used: None Transfers: Sit to/from Stand Sit to Stand: Supervision                  Balance Overall balance assessment: Mild deficits observed, not formally tested                                         ADL either performed or assessed with clinical judgement   ADL Overall ADL's : Modified independent                                       General ADL Comments: pt ambulatory in hall no AD + Supervision for safety, reports being somewhat unsteady compared  to baseline. Pt reports she complete all her ADLs earlier without much challenge, some min A for LBD with socks per report     Vision         Perception         Praxis         Pertinent Vitals/Pain Pain Assessment Pain Assessment: No/denies pain     Extremity/Trunk Assessment Upper Extremity Assessment Upper Extremity Assessment: Overall WFL for tasks assessed   Lower Extremity Assessment Lower Extremity Assessment: Defer to PT evaluation       Communication Communication Communication: No apparent difficulties   Cognition Arousal: Alert Behavior During Therapy: WFL for tasks assessed/performed Overall Cognitive Status: Within Functional Limits for tasks assessed                                       General Comments  Sp02 >91% on RA during functional activity    Exercises     Shoulder Instructions      Home Living Family/patient expects to be  discharged to:: Private residence Living Arrangements: Alone Available Help at Discharge: Family;Friend(s);Available PRN/intermittently Type of Home: Apartment Home Access: Elevator;Stairs to enter Entrance Stairs-Number of Steps: 13 Entrance Stairs-Rails: Can reach both Home Layout: One level     Bathroom Shower/Tub: Chief Strategy Officer: Standard Bathroom Accessibility: No   Home Equipment: Hand held shower head   Additional Comments: One fall on Saturday, blackout episodes      Prior Functioning/Environment Prior Level of Function : History of Falls (last six months);Independent/Modified Independent;Driving             Mobility Comments: Ind no AD ADLs Comments: Ind        OT Problem List: Impaired balance (sitting and/or standing)      OT Treatment/Interventions:      OT Goals(Current goals can be found in the care plan section) Acute Rehab OT Goals Patient Stated Goal: To go home OT Goal Formulation: With patient/family Time For Goal Achievement:  06/14/23 Potential to Achieve Goals: Good  OT Frequency:      Co-evaluation              AM-PAC OT "6 Clicks" Daily Activity     Outcome Measure Help from another person eating meals?: None Help from another person taking care of personal grooming?: None Help from another person toileting, which includes using toliet, bedpan, or urinal?: None Help from another person bathing (including washing, rinsing, drying)?: None Help from another person to put on and taking off regular upper body clothing?: None Help from another person to put on and taking off regular lower body clothing?: A Little 6 Click Score: 23   End of Session Equipment Utilized During Treatment: Gait belt Nurse Communication: Mobility status  Activity Tolerance: Patient tolerated treatment well Patient left: in chair;with call bell/phone within reach;with family/visitor present  OT Visit Diagnosis: Other (comment) (SOB)                Time: 6962-9528 OT Time Calculation (min): 24 min Charges:  OT General Charges $OT Visit: 1 Visit OT Evaluation $OT Eval Low Complexity: 1 Low OT Treatments $Therapeutic Activity: 8-22 mins  05/31/2023  AB, OTR/L  Acute Rehabilitation Services  Office: [phone]   Jorene New 05/31/2023, 11:56 AM

## 2023-05-31 NOTE — Progress Notes (Signed)
 PROGRESS NOTE        PATIENT DETAILS Name: Patricia Morgan Age: 73 y.o. Sex: female Date of Birth: Aug 08, 1950 Admit Date: 05/29/2023 Admitting Physician Corrinne Din, MD AVW:[id number], Myrtie Atkinson, MD  Brief Summary: Patient is a 73 y.o.  female with history of HFrEF-ICD in place, stage IV CKD, factor V Leiden-on anticoagulation, OSA on CPAP-who presented with cough/shortness of breath/nausea/vomiting-she was found to be lethargic-upon further evaluation-she was found to have sepsis secondary to influenza A and aspiration pneumonia-she was subsequently admitted to the hospitalist service.  See below for further details.  Significant events: 1/13>> admit to [facility]  Significant studies: 1/13>> CXR: Cardiomegaly/vascular congestion 1/14>> CT head: No acute intracranial abnormality 1/14>> CT abdomen/pelvis: No diverticulitis-uterine fibroids-RLL pneumonia. 1/14>> VQ scan: No PE  Significant microbiology data: 1/13>> influenza A positive 1/13>> influenza B/COVID/RSV PCR: Negative 1/13>> blood culture: Negative  Procedures: None  Consults: None  Subjective: Feels much better-titrated to room air early this morning.  Objective: Vitals: Blood pressure 104/64, pulse 62, temperature 98.2 F (36.8 C), temperature source Oral, resp. rate 14, height 5\' 2"  (1.575 m), weight 98.8 kg, SpO2 98%.   Exam: Gen Exam:Alert awake-not in any distress HEENT:atraumatic, normocephalic Chest: B/L clear to auscultation anteriorly CVS:S1S2 regular Abdomen:soft non tender, non distended Extremities:no edema Neurology: Non focal Skin: no rash  Pertinent Labs/Radiology:    Latest Ref Rng & Units 05/31/2023    5:10 AM 05/30/2023    4:36 AM 05/29/2023   11:02 PM  CBC  WBC 4.0 - 10.5 K/uL 3.7  4.9  6.7   Hemoglobin 12.0 - 15.0 g/dL 9.8  14.7  82.9   Hematocrit 36.0 - 46.0 % 30.4  32.5  37.7   Platelets 150 - 400 K/uL 113  129  150     Lab Results  Component Value Date   NA  134 (L) 05/31/2023   K 3.7 05/31/2023   CL 102 05/31/2023   CO2 22 05/31/2023      Assessment/Plan: Sepsis secondary to aspiration pneumonia and influenza A infection Sepsis physiology has resolved-clinically improved Suspect may have aspirated when she was encephalopathic. All cultures negative Continue antibiotics/Tamiflu .  Acute hypoxic respiratory failure secondary to aspiration pneumonia/influenza A infection Briefly required HFNC yesterday-on room air this morning  Acute metabolic encephalopathy Secondary to influenza A clinically improved-completely awake/alert this morning.  Chronic HFrEF-ICD in place Euvolemic on exam-acute on chronic HFrEF ruled out Hold Lasix  today due to bump in creatinine Continue Coreg  Resume hydralazine /Imdur  over the next several days depending on what her blood pressure does.  History of VTE-factor V Leiden VQ scan negative for PE On Eliquis   CKD 4 Slight elevation in creatinine from usual baseline Holding furosemide  today  HTN BP stable Continue Coreg  See above plans to resume Imdur /hydralazine  once she is a bit more stable  DM-2 (A1c 5.4 on 1/14) CBG stable on SSI  Recent Labs    05/30/23 1724 05/30/23 2237 05/31/23 0733  GLUCAP 109* 105* 114*    HLD Statin  ILD Supportive care Bronchodilators as needed  OSA CPAP nightly  Morbid Obesity: Estimated body mass index is 39.84 kg/m as calculated from the following:   Height as of this encounter: 5\' 2"  (1.575 m).   Weight as of this encounter: 98.8 kg.   Code status:   Code Status: Full Code   DVT Prophylaxis: apixaban  (ELIQUIS ) tablet 5  mg    Family Communication: None at bedside   Disposition Plan: Status is: Inpatient Remains inpatient appropriate because: Severity of illness   Planned Discharge Destination:Home   Diet: Diet Order             Diet regular Room service appropriate? Yes; Fluid consistency: Thin  Diet effective now                      Antimicrobial agents: Anti-infectives (From admission, onward)    Start     Dose/Rate Route Frequency Ordered Stop   05/30/23 2300  cefTRIAXone  (ROCEPHIN ) 2 g in sodium chloride  0.9 % 100 mL IVPB        2 g 200 mL/hr over 30 Minutes Intravenous Every 24 hours 05/30/23 0335 06/04/23 2259   05/30/23 1000  doxycycline  (VIBRAMYCIN ) 100 mg in dextrose  5 % 250 mL IVPB        100 mg 125 mL/hr over 120 Minutes Intravenous Every 12 hours 05/30/23 0335     05/30/23 0400  oseltamivir  (TAMIFLU ) capsule 30 mg        30 mg Oral Daily 05/30/23 0333 06/04/23 0959   05/30/23 0130  cefTRIAXone  (ROCEPHIN ) 1 g in sodium chloride  0.9 % 100 mL IVPB        1 g 200 mL/hr over 30 Minutes Intravenous  Once 05/30/23 0124 05/30/23 0217   05/30/23 0130  azithromycin  (ZITHROMAX ) 500 mg in sodium chloride  0.9 % 250 mL IVPB        500 mg 250 mL/hr over 60 Minutes Intravenous  Once 05/30/23 0124 05/30/23 0426   05/30/23 0130  oseltamivir  (TAMIFLU ) capsule 75 mg        75 mg Oral  Once 05/30/23 0124 05/30/23 0143        MEDICATIONS: Scheduled Meds:  allopurinol   100 mg Oral BID   apixaban   5 mg Oral BID   benzonatate   100 mg Oral TID   carvedilol   25 mg Oral BID   guaiFENesin   1,200 mg Oral BID   insulin  aspart  0-15 Units Subcutaneous TID WC   insulin  aspart  0-5 Units Subcutaneous QHS   oseltamivir   30 mg Oral Daily   rosuvastatin   10 mg Oral Daily   Continuous Infusions:  cefTRIAXone  (ROCEPHIN )  IV Stopped (05/31/23 0718)   doxycycline  (VIBRAMYCIN ) IV 100 mg (05/31/23 0838)   PRN Meds:.acetaminophen  **OR** acetaminophen , chlorpheniramine-HYDROcodone    I have personally reviewed following labs and imaging studies  LABORATORY DATA: CBC: Recent Labs  Lab 05/29/23 2302 05/30/23 0436 05/31/23 0510  WBC 6.7 4.9 3.7*  NEUTROABS 5.2 3.5 2.6  HGB 12.1 10.4* 9.8*  HCT 37.7 32.5* 30.4*  MCV 95.7 96.7 95.6  PLT 150 129* 113*    Basic Metabolic Panel: Recent Labs  Lab 05/29/23 2302  05/30/23 0436 05/31/23 0510  NA 136 139 134*  K 4.3 4.0 3.7  CL 101 104 102  CO2 21* 25 22  GLUCOSE 136* 127* 131*  BUN 44* 45* 53*  CREATININE 3.14* 2.96* 3.14*  CALCIUM  10.1 9.4 8.8*    GFR: Estimated Creatinine Clearance: 17.3 mL/min (A) (by C-G formula based on SCr of 3.14 mg/dL (H)).  Liver Function Tests: Recent Labs  Lab 05/29/23 2302 05/31/23 0510  AST 30 53*  ALT 15 18  ALKPHOS 104 73  BILITOT 1.1 0.6  PROT 7.9 6.0*  ALBUMIN  3.9 3.0*   No results for input(s): "LIPASE", "AMYLASE" in the last 168 hours. No results for input(s): "AMMONIA"  in the last 168 hours.  Coagulation Profile: Recent Labs  Lab 05/29/23 2302  INR 1.5*    Cardiac Enzymes: No results for input(s): "CKTOTAL", "CKMB", "CKMBINDEX", "TROPONINI" in the last 168 hours.  BNP (last 3 results) No results for input(s): "PROBNP" in the last 8760 hours.  Lipid Profile: No results for input(s): "CHOL", "HDL", "LDLCALC", "TRIG", "CHOLHDL", "LDLDIRECT" in the last 72 hours.  Thyroid  Function Tests: No results for input(s): "TSH", "T4TOTAL", "FREET4", "T3FREE", "THYROIDAB" in the last 72 hours.  Anemia Panel: No results for input(s): "VITAMINB12", "FOLATE", "FERRITIN", "TIBC", "IRON ", "RETICCTPCT" in the last 72 hours.  Urine analysis:    Component Value Date/Time   COLORURINE YELLOW 05/29/2023 2333   APPEARANCEUR HAZY (A) 05/29/2023 2333   LABSPEC 1.011 05/29/2023 2333   PHURINE 6.0 05/29/2023 2333   GLUCOSEU NEGATIVE 05/29/2023 2333   GLUCOSEU NEGATIVE 09/30/2011 1028   HGBUR NEGATIVE 05/29/2023 2333   BILIRUBINUR NEGATIVE 05/29/2023 2333   BILIRUBINUR negative 08/15/2022 1123   KETONESUR NEGATIVE 05/29/2023 2333   PROTEINUR >=300 (A) 05/29/2023 2333   UROBILINOGEN 0.2 08/15/2022 1123   UROBILINOGEN 0.2 09/30/2011 1028   NITRITE NEGATIVE 05/29/2023 2333   LEUKOCYTESUR NEGATIVE 05/29/2023 2333    Sepsis Labs: Lactic Acid, Venous    Component Value Date/Time   LATICACIDVEN 1.5  05/30/2023 1216    MICROBIOLOGY: Recent Results (from the past 240 hours)  Culture, blood (Routine x 2)     Status: None (Preliminary result)   Collection Time: 05/29/23 11:02 PM   Specimen: BLOOD  Result Value Ref Range Status   Specimen Description BLOOD SITE NOT SPECIFIED  Final   Special Requests   Final    BOTTLES DRAWN AEROBIC AND ANAEROBIC Blood Culture results may not be optimal due to an inadequate volume of blood received in culture bottles   Culture   Final    NO GROWTH 2 DAYS Performed at [facility], [address], [place], [place] [address]    Report Status PENDING  Incomplete  Culture, blood (Routine x 2)     Status: None (Preliminary result)   Collection Time: 05/29/23 11:33 PM   Specimen: BLOOD  Result Value Ref Range Status   Specimen Description BLOOD LEFT ANTECUBITAL  Final   Special Requests   Final    BOTTLES DRAWN AEROBIC AND ANAEROBIC Blood Culture adequate volume   Culture   Final    NO GROWTH 2 DAYS Performed at [facility], [address] [place], [place] [address]    Report Status PENDING  Incomplete  Resp panel by RT-PCR (RSV, Flu A&B, Covid) Anterior Nasal Swab     Status: Abnormal   Collection Time: 05/29/23 11:33 PM   Specimen: Anterior Nasal Swab  Result Value Ref Range Status   SARS Coronavirus 2 by RT PCR NEGATIVE NEGATIVE Final   Influenza A by PCR POSITIVE (A) NEGATIVE Final   Influenza B by PCR NEGATIVE NEGATIVE Final    Comment: (NOTE) The Xpert Xpress SARS-CoV-2/FLU/RSV plus assay is intended as an aid in the diagnosis of influenza from Nasopharyngeal swab specimens and should not be used as a sole basis for treatment. Nasal washings and aspirates are unacceptable for Xpert Xpress SARS-CoV-2/FLU/RSV testing.  Fact Sheet for Patients: BloggerCourse.com  Fact Sheet for Healthcare Providers: SeriousBroker.it  This test is not yet approved or cleared by the [place] FDA and has been authorized for detection and/or diagnosis of SARS-CoV-2 by FDA under an Emergency Use Authorization (EUA). This EUA  will remain in effect (meaning this test can be used) for the duration of the COVID-19 declaration under Section 564(b)(1) of the Act, 21 U.S.C. section 360bbb-3(b)(1), unless the authorization is terminated or revoked.     Resp Syncytial Virus by PCR NEGATIVE NEGATIVE Final    Comment: (NOTE) Fact Sheet for Patients: BloggerCourse.com  Fact Sheet for Healthcare Providers: SeriousBroker.it  This test is not yet approved or cleared by the [place]  FDA and has been authorized for detection and/or diagnosis of SARS-CoV-2 by FDA under an Emergency Use Authorization (EUA). This EUA will remain in effect (meaning this test can be used) for the duration of the COVID-19 declaration under Section 564(b)(1) of the Act, 21 U.S.C. section 360bbb-3(b)(1), unless the authorization is terminated or revoked.  Performed at [facility], [address], [place], [place] [address]     RADIOLOGY STUDIES/RESULTS: NM Pulmonary Perfusion Result Date: 05/30/2023 CLINICAL DATA:  Pulmonary embolism (PE) suspected, high prob. Shortness of breath EXAM: NUCLEAR MEDICINE PERFUSION LUNG SCAN TECHNIQUE: Perfusion images were obtained in multiple projections after intravenous injection of radiopharmaceutical. Ventilation scans intentionally deferred if perfusion scan and chest x-ray adequate for interpretation during COVID 19 epidemic. RADIOPHARMACEUTICALS:  4.4 mCi Tc-57m MAA IV COMPARISON:  Chest x-ray 05/29/2023 FINDINGS: No segmental or subsegmental perfusion defects to suggest pulmonary embolus. IMPRESSION: No evidence of pulmonary embolus. Electronically Signed   By: Janeece Mechanic M.D.   On: 05/30/2023 10:42   CT ABDOMEN PELVIS WO CONTRAST Result Date: 05/30/2023 CLINICAL DATA:  LLQ abdominal pain Diverticulitis,  complication suspected GFR 15, LLQ abd pain EXAM: CT ABDOMEN AND PELVIS WITHOUT CONTRAST TECHNIQUE: Multidetector CT imaging of the abdomen and pelvis was performed following the standard protocol without IV contrast. RADIATION DOSE REDUCTION: This exam was performed according to the departmental dose-optimization program which includes automated exposure control, adjustment of the mA and/or kV according to patient size and/or use of iterative reconstruction technique. COMPARISON:  CT abdomen pelvis 03/24/2023 cholecystic x-ray 05/29/2023 FINDINGS: Lower chest: Right lower lobe peribronchovascular patchy airspace opacity. Cardiac lead noted. Hepatobiliary: No focal liver abnormality. No gallstones, gallbladder wall thickening, or pericholecystic fluid. No biliary dilatation. Pancreas: No focal lesion. Normal pancreatic contour. No surrounding inflammatory changes. No main pancreatic ductal dilatation. Spleen: Normal in size without focal abnormality. Adrenals/Urinary Tract: No adrenal nodule bilaterally. No nephrolithiasis and no hydronephrosis. Fluid density lesions of the bilateral kidneys likely represent simple renal cysts. Simple renal cysts, in the absence of clinically indicated signs/symptoms, require no independent follow-up. No ureterolithiasis or hydroureter. The urinary bladder is unremarkable. Stomach/Bowel: Stomach is within normal limits. No evidence of bowel wall thickening or dilatation. Colonic diverticulosis. Appendix appears normal. Vascular/Lymphatic: No abdominal aorta or iliac aneurysm. Mild atherosclerotic plaque of the aorta and its branches. No abdominal, pelvic, or inguinal lymphadenopathy. Reproductive: Subcentimeter calcified intramural uterine lesions consistent with uterine fibroids. Otherwise uterus and bilateral adnexa are unremarkable. Other: No intraperitoneal free fluid. No intraperitoneal free gas. No organized fluid collection. Musculoskeletal: No abdominal wall hernia or  abnormality. Tiny fat containing umbilical hernia. No suspicious lytic or blastic osseous lesions. No acute displaced fracture. L4-L5 and L5-1 inter vertebral disc space vacuum phenomenon. IMPRESSION: 1. Right lower lobe pneumonia. Followup PA and lateral chest X-ray is recommended in 3-4 weeks following therapy to ensure resolution and exclude underlying malignancy. 2. Colonic diverticulosis with no acute diverticulitis. 3. Uterine fibroids. 4.  Aortic Atherosclerosis (ICD10-I70.0). Electronically Signed   By: Morgane  Naveau M.D.   On: 05/30/2023 01:19  CT HEAD WO CONTRAST ( ) Result Date: 05/30/2023 CLINICAL DATA:  Mental status change, unknown cause She is altered, but it is intermittent. A/O x2. EXAM: CT HEAD WITHOUT CONTRAST TECHNIQUE: Contiguous axial images were obtained from the base of the skull through the vertex without intravenous contrast. RADIATION DOSE REDUCTION: This exam was performed according to the departmental dose-optimization program which includes automated exposure control, adjustment of the mA and/or kV according to patient size and/or use of iterative reconstruction technique. COMPARISON:  None Available. FINDINGS: Brain: Patchy and confluent areas of decreased attenuation are noted throughout the deep and periventricular white matter of the cerebral hemispheres bilaterally, compatible with chronic microvascular ischemic disease. Chronic appearing right cerebral lacunar infarction versus dilatation of the pre-vascular space (5:16). No evidence of large-territorial acute infarction. No parenchymal hemorrhage. No mass lesion. No extra-axial collection. No mass effect or midline shift. No hydrocephalus. Basilar cisterns are patent. Vascular: No hyperdense vessel. Atherosclerotic calcifications are present within the cavernous internal carotid arteries. Skull: No acute fracture or focal lesion. Sinuses/Orbits: Paranasal sinuses and mastoid air cells are clear. Bilateral lens  replacement. Otherwise the orbits are unremarkable. Other: None. IMPRESSION: No acute intracranial abnormality. Electronically Signed   By: Morgane  Naveau M.D.   On: 05/30/2023 01:09   DG Chest Portable 1 View Result Date: 05/29/2023 CLINICAL DATA:  Sepsis altered EXAM: PORTABLE CHEST 1 VIEW COMPARISON:  03/15/2018 FINDINGS: Left-sided pacing device as before. Cardiomegaly with vascular congestion. Possible trace right effusion. No consolidation or pneumothorax. Aortic atherosclerosis IMPRESSION: Cardiomegaly with vascular congestion and possible trace right effusion. Electronically Signed   By: Esmeralda Hedge M.D.   On: 05/29/2023 23:22     LOS: 1 day   Kimberly Penna, MD  [facility]    To contact the attending provider between 7A-7P or the covering provider during after hours 7P-7A, please log into the web site [email or web address] and access using universal [place] password for that web site. If you do not have the password, please call the hospital operator.  05/31/2023, 9:35 AM

## 2023-05-31 NOTE — Progress Notes (Signed)
 Heart Failure Navigator Progress Note  Assessed for Heart & Vascular TOC clinic readiness.  Patient does not meet criteria due to per MD note- more chronic CHF , No HF TOC.   Navigator will sign off at this time.   Randie Bustle, BSN, Scientist, clinical (histocompatibility and immunogenetics) Only

## 2023-05-31 NOTE — Plan of Care (Signed)
  Problem: Education: Goal: Ability to describe self-care measures that may prevent or decrease complications (Diabetes Survival Skills Education) will improve Outcome: Progressing Goal: Individualized Educational Video(s) Outcome: Progressing   Problem: Coping: Goal: Ability to adjust to condition or change in health will improve Outcome: Progressing   Problem: Fluid Volume: Goal: Ability to maintain a balanced intake and output will improve Outcome: Progressing   Problem: Health Behavior/Discharge Planning: Goal: Ability to identify and utilize available resources and services will improve Outcome: Progressing Goal: Ability to manage health-related needs will improve Outcome: Progressing   Problem: Metabolic: Goal: Ability to maintain appropriate glucose levels will improve Outcome: Progressing   Problem: Nutritional: Goal: Maintenance of adequate nutrition will improve Outcome: Progressing Goal: Progress toward achieving an optimal weight will improve Outcome: Progressing   Problem: Skin Integrity: Goal: Risk for impaired skin integrity will decrease Outcome: Progressing   Problem: Tissue Perfusion: Goal: Adequacy of tissue perfusion will improve Outcome: Progressing   Problem: Education: Goal: Knowledge of General Education information will improve Description: Including pain rating scale, medication(s)/side effects and non-pharmacologic comfort measures Outcome: Progressing   Problem: Health Behavior/Discharge Planning: Goal: Ability to manage health-related needs will improve Outcome: Progressing   Problem: Clinical Measurements: Goal: Ability to maintain clinical measurements within normal limits will improve Outcome: Progressing Goal: Will remain free from infection Outcome: Progressing Goal: Diagnostic test results will improve Outcome: Progressing Goal: Respiratory complications will improve Outcome: Progressing Goal: Cardiovascular complication will  be avoided Outcome: Progressing   Problem: Activity: Goal: Risk for activity intolerance will decrease Outcome: Progressing   Problem: Nutrition: Goal: Adequate nutrition will be maintained Outcome: Progressing   Problem: Coping: Goal: Level of anxiety will decrease Outcome: Progressing   Problem: Elimination: Goal: Will not experience complications related to bowel motility Outcome: Progressing Goal: Will not experience complications related to urinary retention Outcome: Progressing   Problem: Pain Management: Goal: General experience of comfort will improve Outcome: Progressing   Problem: Safety: Goal: Ability to remain free from injury will improve Outcome: Progressing   Problem: Skin Integrity: Goal: Risk for impaired skin integrity will decrease Outcome: Progressing   Problem: Activity: Goal: Ability to tolerate increased activity will improve Outcome: Progressing   Problem: Clinical Measurements: Goal: Ability to maintain a body temperature in the normal range will improve Outcome: Progressing   Problem: Respiratory: Goal: Ability to maintain adequate ventilation will improve Outcome: Progressing Goal: Ability to maintain a clear airway will improve Outcome: Progressing

## 2023-06-01 ENCOUNTER — Other Ambulatory Visit (HOSPITAL_COMMUNITY): Payer: Self-pay

## 2023-06-01 DIAGNOSIS — I10 Essential (primary) hypertension: Secondary | ICD-10-CM | POA: Diagnosis not present

## 2023-06-01 DIAGNOSIS — J09X1 Influenza due to identified novel influenza A virus with pneumonia: Secondary | ICD-10-CM | POA: Diagnosis not present

## 2023-06-01 DIAGNOSIS — G9341 Metabolic encephalopathy: Secondary | ICD-10-CM | POA: Diagnosis not present

## 2023-06-01 DIAGNOSIS — I5021 Acute systolic (congestive) heart failure: Secondary | ICD-10-CM | POA: Diagnosis not present

## 2023-06-01 LAB — BASIC METABOLIC PANEL
Anion gap: 10 (ref 5–15)
BUN: 59 mg/dL — ABNORMAL HIGH (ref 8–23)
CO2: 21 mmol/L — ABNORMAL LOW (ref 22–32)
Calcium: 8.6 mg/dL — ABNORMAL LOW (ref 8.9–10.3)
Chloride: 99 mmol/L (ref 98–111)
Creatinine, Ser: 3.03 mg/dL — ABNORMAL HIGH (ref 0.44–1.00)
GFR, Estimated: 16 mL/min — ABNORMAL LOW (ref >60)
Glucose, Bld: 123 mg/dL — ABNORMAL HIGH (ref 70–99)
Potassium: 3.7 mmol/L (ref 3.5–5.1)
Sodium: 130 mmol/L — ABNORMAL LOW (ref 135–145)

## 2023-06-01 LAB — CBC
HCT: 29 % — ABNORMAL LOW (ref 36.0–46.0)
Hemoglobin: 9.5 g/dL — ABNORMAL LOW (ref 12.0–15.0)
MCH: 31.1 pg (ref 26.0–34.0)
MCHC: 32.8 g/dL (ref 30.0–36.0)
MCV: 95.1 fL (ref 80.0–100.0)
Platelets: 115 10*3/uL — ABNORMAL LOW (ref 150–400)
RBC: 3.05 MIL/uL — ABNORMAL LOW (ref 3.87–5.11)
RDW: 14.4 % (ref 11.5–15.5)
WBC: 3.4 10*3/uL — ABNORMAL LOW (ref 4.0–10.5)
nRBC: 0 % (ref 0.0–0.2)

## 2023-06-01 LAB — GLUCOSE, CAPILLARY: Glucose-Capillary: 103 mg/dL — ABNORMAL HIGH (ref 70–99)

## 2023-06-01 MED ORDER — OSELTAMIVIR PHOSPHATE 30 MG PO CAPS
30.0000 mg | ORAL_CAPSULE | Freq: Every day | ORAL | 0 refills | Status: AC
  Filled 2023-06-01: qty 2, 2d supply, fill #0

## 2023-06-01 MED ORDER — BENZONATATE 100 MG PO CAPS
100.0000 mg | ORAL_CAPSULE | Freq: Three times a day (TID) | ORAL | 0 refills | Status: DC | PRN
  Filled 2023-06-01: qty 20, 7d supply, fill #0

## 2023-06-01 MED ORDER — AMOXICILLIN-POT CLAVULANATE 500-125 MG PO TABS
1.0000 | ORAL_TABLET | Freq: Two times a day (BID) | ORAL | 0 refills | Status: AC
  Filled 2023-06-01: qty 4, 2d supply, fill #0

## 2023-06-01 MED ORDER — AMOXICILLIN-POT CLAVULANATE 500-125 MG PO TABS
1.0000 | ORAL_TABLET | Freq: Three times a day (TID) | ORAL | 0 refills | Status: DC
  Filled 2023-06-01: qty 6, 2d supply, fill #0

## 2023-06-01 NOTE — TOC Transition Note (Signed)
Transition of Care [facility]) - Discharge Note   Patient Details  Name: Patricia Morgan MRN: [id number] Date of Birth: May 24, 1950  [facility] CM/SW Contact:  Gordy Clement, RN Phone Number: 06/01/2023, 9:54 AM   Clinical Narrative:     Patient will DC to home . BSC to be delivered bedside by [employer]. Family to transport. No additional TOC needs. Patient will follow up as directed on AVS              Patient Goals and CMS Choice            Discharge Placement                       Discharge Plan and Services Additional resources added to the After Visit Summary for                                       Social Drivers of Health (SDOH) Interventions SDOH Screenings   Food Insecurity: No Food Insecurity (05/31/2023)  Housing: Unknown (05/31/2023)  Transportation Needs: No Transportation Needs (05/31/2023)  Utilities: Not At Risk (05/31/2023)  Social Connections: Unknown (05/31/2023)  Tobacco Use: Low Risk  (05/30/2023)     Readmission Risk Interventions     No data to display

## 2023-06-01 NOTE — Discharge Summary (Signed)
PATIENT DETAILS Name: Patricia Morgan Age: 73 y.o. Sex: female Date of Birth: 08-05-1950 MRN: [id number]. Admitting Physician: Gery Pray, MD WUJ:[id number], Onalee Hua, MD  Admit Date: 05/29/2023 Discharge date: 06/01/2023  Recommendations for Outpatient Follow-up:  Follow up with PCP in 1-2 weeks Please obtain CMP/CBC in one week  Admitted From:  Home  Disposition: Home   Discharge Condition: good  CODE STATUS:   Code Status: Full Code   Diet recommendation:  Diet Order             Diet - low sodium heart healthy           Diet regular Room service appropriate? Yes; Fluid consistency: Thin  Diet effective now                    Brief Summary: Patient is a 73 y.o.  female with history of HFrEF-ICD in place, stage IV CKD, factor V Leiden-on anticoagulation, OSA on CPAP-who presented with cough/shortness of breath/nausea/vomiting-she was found to be lethargic-upon further evaluation-she was found to have sepsis secondary to influenza A and aspiration pneumonia-she was subsequently admitted to the hospitalist service.  See below for further details.   Significant events: 1/13>> admit to [facility]   Significant studies: 1/13>> CXR: Cardiomegaly/vascular congestion 1/14>> CT head: No acute intracranial abnormality 1/14>> CT abdomen/pelvis: No diverticulitis-uterine fibroids-RLL pneumonia. 1/14>> VQ scan: No PE   Significant microbiology data: 1/13>> influenza A positive 1/13>> influenza B/COVID/RSV PCR: Negative 1/13>> blood culture: Negative   Procedures: None   Consults: None  Brief Hospital Course: Sepsis secondary to aspiration pneumonia and influenza A infection Sepsis physiology has resolved Suspect may have aspirated when she was encephalopathic. All cultures negative Will transition to oral antibiotics and continue Tamiflu times total of 5 days.  Stable for discharge today.   Acute hypoxic respiratory failure secondary to aspiration  pneumonia/influenza A infection Briefly required HFNC on admission-has been on room air since yesterday.   Acute metabolic encephalopathy Secondary to influenza A clinically improved-completely awake/alert this morning.   Chronic HFrEF-ICD in place Euvolemic on exam-acute on chronic HFrEF ruled out Lasix on 1/15 due to slight bump in creatinine-will resume at usual dose today Continue Coreg Resume hydralazine/Imdur on discharge.   History of VTE-factor V Leiden VQ scan negative for PE On Eliquis   CKD 4 Slight elevation in creatinine from usual baseline on 1/15-Lasix held-almost back to baseline on 1/16.   HTN BP stable Continue Coreg/Imdur/hydralazine.   DM-2 (A1c 5.4 on 1/14) CBG stable on SSI while patient Diet controlled as an outpatient.  HLD Statin   ILD Supportive care Bronchodilators as needed   OSA CPAP nightly   Morbid Obesity: Estimated body mass index is 39.84 kg/m as calculated from the following:   Height as of this encounter: 5\' 2"  (1.575 m).   Weight as of this encounter: 98.8 kg.   Discharge Diagnoses:  Principal Problem:   Influenza A with pneumonia Active Problems:   T2DM (type 2 diabetes mellitus) (HCC)   Benign essential HTN   OSA (obstructive sleep apnea)   CKD (chronic kidney disease), stage IV (HCC)   Severe obesity (BMI 35.0-35.9 with comorbidity) (HCC)   Popliteal DVT (deep venous thrombosis) (HCC)   Factor V Leiden (HCC)   ICD (implantable cardioverter-defibrillator) in place   ILD (interstitial lung disease) (HCC)   Acute metabolic encephalopathy   Haemophilus influenzae pneumonia (HCC)   Acute HFrEF (heart failure with reduced ejection fraction) (HCC)   Discharge Instructions:  Activity:  As tolerated with Full fall precautions use walker/cane & assistance as needed  Discharge Instructions     (HEART FAILURE PATIENTS) Call MD:  Anytime you have any of the following symptoms: 1) 3 pound weight gain in 24 hours or 5  pounds in 1 week 2) shortness of breath, with or without a dry hacking cough 3) swelling in the hands, feet or stomach 4) if you have to sleep on extra pillows at night in order to breathe.   Complete by: As directed    Call MD for:  difficulty breathing, headache or visual disturbances   Complete by: As directed    Call MD for:  extreme fatigue   Complete by: As directed    Call MD for:  persistant dizziness or light-headedness   Complete by: As directed    Diet - low sodium heart healthy   Complete by: As directed    Discharge instructions   Complete by: As directed    Follow with Primary MD  Tally Joe, MD in 1-2 weeks  Please get a complete blood count and chemistry panel checked by your Primary MD at your next visit, and again as instructed by your Primary MD.  Get Medicines reviewed and adjusted: Please take all your medications with you for your next visit with your Primary MD  Laboratory/radiological data: Please request your Primary MD to go over all hospital tests and procedure/radiological results at the follow up, please ask your Primary MD to get all Hospital records sent to his/her office.  In some cases, they will be blood work, cultures and biopsy results pending at the time of your discharge. Please request that your primary care M.D. follows up on these results.  Also Note the following: If you experience worsening of your admission symptoms, develop shortness of breath, life threatening emergency, suicidal or homicidal thoughts you must seek medical attention immediately by calling 911 or calling your MD immediately  if symptoms less severe.  You must read complete instructions/literature along with all the possible adverse reactions/side effects for all the Medicines you take and that have been prescribed to you. Take any new Medicines after you have completely understood and accpet all the possible adverse reactions/side effects.   Do not drive when taking Pain  medications or sleeping medications (Benzodaizepines)  Do not take more than prescribed Pain, Sleep and Anxiety Medications. It is not advisable to combine anxiety,sleep and pain medications without talking with your primary care practitioner  Special Instructions: If you have smoked or chewed Tobacco  in the last 2 yrs please stop smoking, stop any regular Alcohol  and or any Recreational drug use.  Wear Seat belts while driving.  Please note: You were cared for by a hospitalist during your hospital stay. Once you are discharged, your primary care physician will handle any further medical issues. Please note that NO REFILLS for any discharge medications will be authorized once you are discharged, as it is imperative that you return to your primary care physician (or establish a relationship with a primary care physician if you do not have one) for your post hospital discharge needs so that they can reassess your need for medications and monitor your lab values.   Increase activity slowly   Complete by: As directed       Allergies as of 06/01/2023   No Known Allergies      Medication List     TAKE these medications    acetaminophen 500 MG tablet Commonly known  as: TYLENOL Take 500 mg by mouth every 6 (six) hours as needed for mild pain (pain score 1-3).   allopurinol 100 MG tablet Commonly known as: ZYLOPRIM Take 100 mg by mouth 2 (two) times daily.   amoxicillin-clavulanate 500-125 MG tablet Commonly known as: Augmentin Take 1 tablet by mouth 3 (three) times daily for 2 days.   benzonatate 100 MG capsule Commonly known as: TESSALON Take 1 capsule (100 mg total) by mouth 3 (three) times daily as needed for cough.   carvedilol 25 MG tablet Commonly known as: COREG TAKE 1 TABLET BY MOUTH TWICE  DAILY   cyanocobalamin 1000 MCG tablet Commonly known as: VITAMIN B12 Take 1,000 mcg by mouth daily.   diphenhydrAMINE 25 MG tablet Commonly known as: BENADRYL Take 25 mg by  mouth at bedtime.   Eliquis 5 MG Tabs tablet Generic drug: apixaban TAKE 1 TABLET TWICE DAILY What changed:  how much to take when to take this   Fluocinolone Acetonide 0.01 % Oil Place 5 drops in ear(s) daily. What changed:  when to take this reasons to take this   furosemide 80 MG tablet Commonly known as: LASIX Take 80 mg by mouth daily.   hydrALAZINE 10 MG tablet Commonly known as: APRESOLINE TAKE 1 TABLET BY MOUTH 3 TIMES  DAILY   Iron 240 (27 Fe) MG Tabs 1 tablet Orally twice a day   isosorbide mononitrate 30 MG 24 hr tablet Commonly known as: IMDUR TAKE 1 TABLET BY MOUTH DAILY   oseltamivir 30 MG capsule Commonly known as: TAMIFLU Take 1 capsule (30 mg total) by mouth daily for 2 days.   rosuvastatin 10 MG tablet Commonly known as: CRESTOR TAKE 1 TABLET BY MOUTH DAILY What changed: when to take this   Vitamin D-3 125 MCG (5000 UT) Tabs Take 5,000 Units by mouth daily.   vitamin E 180 MG (400 UNITS) capsule Take 400 Units by mouth daily.        No Known Allergies   Other Procedures/Studies: NM Pulmonary Perfusion Result Date: 05/30/2023 CLINICAL DATA:  Pulmonary embolism (PE) suspected, high prob. Shortness of breath EXAM: NUCLEAR MEDICINE PERFUSION LUNG SCAN TECHNIQUE: Perfusion images were obtained in multiple projections after intravenous injection of radiopharmaceutical. Ventilation scans intentionally deferred if perfusion scan and chest x-ray adequate for interpretation during COVID 19 epidemic. RADIOPHARMACEUTICALS:  4.4 mCi Tc-35m MAA IV COMPARISON:  Chest x-ray 05/29/2023 FINDINGS: No segmental or subsegmental perfusion defects to suggest pulmonary embolus. IMPRESSION: No evidence of pulmonary embolus. Electronically Signed   By: Charlett Nose M.D.   On: 05/30/2023 10:42   CT ABDOMEN PELVIS WO CONTRAST Result Date: 05/30/2023 CLINICAL DATA:  LLQ abdominal pain Diverticulitis, complication suspected GFR 15, LLQ abd pain EXAM: CT ABDOMEN AND PELVIS  WITHOUT CONTRAST TECHNIQUE: Multidetector CT imaging of the abdomen and pelvis was performed following the standard protocol without IV contrast. RADIATION DOSE REDUCTION: This exam was performed according to the departmental dose-optimization program which includes automated exposure control, adjustment of the mA and/or kV according to patient size and/or use of iterative reconstruction technique. COMPARISON:  CT abdomen pelvis 03/24/2023 cholecystic x-ray 05/29/2023 FINDINGS: Lower chest: Right lower lobe peribronchovascular patchy airspace opacity. Cardiac lead noted. Hepatobiliary: No focal liver abnormality. No gallstones, gallbladder wall thickening, or pericholecystic fluid. No biliary dilatation. Pancreas: No focal lesion. Normal pancreatic contour. No surrounding inflammatory changes. No main pancreatic ductal dilatation. Spleen: Normal in size without focal abnormality. Adrenals/Urinary Tract: No adrenal nodule bilaterally. No nephrolithiasis and no hydronephrosis. Fluid density  lesions of the bilateral kidneys likely represent simple renal cysts. Simple renal cysts, in the absence of clinically indicated signs/symptoms, require no independent follow-up. No ureterolithiasis or hydroureter. The urinary bladder is unremarkable. Stomach/Bowel: Stomach is within normal limits. No evidence of bowel wall thickening or dilatation. Colonic diverticulosis. Appendix appears normal. Vascular/Lymphatic: No abdominal aorta or iliac aneurysm. Mild atherosclerotic plaque of the aorta and its branches. No abdominal, pelvic, or inguinal lymphadenopathy. Reproductive: Subcentimeter calcified intramural uterine lesions consistent with uterine fibroids. Otherwise uterus and bilateral adnexa are unremarkable. Other: No intraperitoneal free fluid. No intraperitoneal free gas. No organized fluid collection. Musculoskeletal: No abdominal wall hernia or abnormality. Tiny fat containing umbilical hernia. No suspicious lytic or  blastic osseous lesions. No acute displaced fracture. L4-L5 and L5-1 inter vertebral disc space vacuum phenomenon. IMPRESSION: 1. Right lower lobe pneumonia. Followup PA and lateral chest X-ray is recommended in 3-4 weeks following therapy to ensure resolution and exclude underlying malignancy. 2. Colonic diverticulosis with no acute diverticulitis. 3. Uterine fibroids. 4.  Aortic Atherosclerosis (ICD10-I70.0). Electronically Signed   By: Tish Frederickson M.D.   On: 05/30/2023 01:19   CT HEAD WO CONTRAST ( ) Result Date: 05/30/2023 CLINICAL DATA:  Mental status change, unknown cause She is altered, but it is intermittent. A/O x2. EXAM: CT HEAD WITHOUT CONTRAST TECHNIQUE: Contiguous axial images were obtained from the base of the skull through the vertex without intravenous contrast. RADIATION DOSE REDUCTION: This exam was performed according to the departmental dose-optimization program which includes automated exposure control, adjustment of the mA and/or kV according to patient size and/or use of iterative reconstruction technique. COMPARISON:  None Available. FINDINGS: Brain: Patchy and confluent areas of decreased attenuation are noted throughout the deep and periventricular white matter of the cerebral hemispheres bilaterally, compatible with chronic microvascular ischemic disease. Chronic appearing right cerebral lacunar infarction versus dilatation of the pre-vascular space (5:16). No evidence of large-territorial acute infarction. No parenchymal hemorrhage. No mass lesion. No extra-axial collection. No mass effect or midline shift. No hydrocephalus. Basilar cisterns are patent. Vascular: No hyperdense vessel. Atherosclerotic calcifications are present within the cavernous internal carotid arteries. Skull: No acute fracture or focal lesion. Sinuses/Orbits: Paranasal sinuses and mastoid air cells are clear. Bilateral lens replacement. Otherwise the orbits are unremarkable. Other: None. IMPRESSION: No acute  intracranial abnormality. Electronically Signed   By: Tish Frederickson M.D.   On: 05/30/2023 01:09   DG Chest Portable 1 View Result Date: 05/29/2023 CLINICAL DATA:  Sepsis altered EXAM: PORTABLE CHEST 1 VIEW COMPARISON:  03/15/2018 FINDINGS: Left-sided pacing device as before. Cardiomegaly with vascular congestion. Possible trace right effusion. No consolidation or pneumothorax. Aortic atherosclerosis IMPRESSION: Cardiomegaly with vascular congestion and possible trace right effusion. Electronically Signed   By: Jasmine Pang M.D.   On: 05/29/2023 23:22     TODAY-DAY OF DISCHARGE:  Subjective:   Patricia Morgan today has no headache,no chest abdominal pain,no new weakness tingling or numbness, feels much better wants to go home today.   Objective:   Blood pressure (!) 109/56, pulse 66, temperature 99.1 F (37.3 C), temperature source Oral, resp. rate 18, height 5\' 2"  (1.575 m), weight 98.8 kg, SpO2 100%. No intake or output data in the 24 hours ending 06/01/23 0913 Filed Weights   05/30/23 0345 05/30/23 2012  Weight: 90 kg 98.8 kg    Exam: Awake Alert, Oriented *3, No new F.N deficits, Normal affect [place].AT,PERRAL Supple Neck,No JVD, No cervical lymphadenopathy appriciated.  Symmetrical Chest wall movement, Good air movement bilaterally, CTAB RRR,No  Gallops,Rubs or new Murmurs, No Parasternal Heave +ve B.Sounds, Abd Soft, Non tender, No organomegaly appriciated, No rebound -guarding or rigidity. No Cyanosis, Clubbing or edema, No new Rash or bruise   PERTINENT RADIOLOGIC STUDIES: NM Pulmonary Perfusion Result Date: 05/30/2023 CLINICAL DATA:  Pulmonary embolism (PE) suspected, high prob. Shortness of breath EXAM: NUCLEAR MEDICINE PERFUSION LUNG SCAN TECHNIQUE: Perfusion images were obtained in multiple projections after intravenous injection of radiopharmaceutical. Ventilation scans intentionally deferred if perfusion scan and chest x-ray adequate for interpretation during COVID 19  epidemic. RADIOPHARMACEUTICALS:  4.4 mCi Tc-6m MAA IV COMPARISON:  Chest x-ray 05/29/2023 FINDINGS: No segmental or subsegmental perfusion defects to suggest pulmonary embolus. IMPRESSION: No evidence of pulmonary embolus. Electronically Signed   By: Charlett Nose M.D.   On: 05/30/2023 10:42     PERTINENT LAB RESULTS: CBC: Recent Labs    05/31/23 0510 06/01/23 0510  WBC 3.7* 3.4*  HGB 9.8* 9.5*  HCT 30.4* 29.0*  PLT 113* 115*   CMET CMP     Component Value Date/Time   NA 130 (L) 06/01/2023 0510   NA 141 08/17/2020 1220   NA 141 12/07/2016 1300   NA 143 03/18/2016 0944   K 3.7 06/01/2023 0510   K 4.7 12/07/2016 1300   K 4.9 03/18/2016 0944   CL 99 06/01/2023 0510   CL 103 12/07/2016 1300   CO2 21 (L) 06/01/2023 0510   CO2 29 12/07/2016 1300   CO2 27 03/18/2016 0944   GLUCOSE 123 (H) 06/01/2023 0510   GLUCOSE 120 (H) 12/07/2016 1300   BUN 59 (H) 06/01/2023 0510   BUN 39 (H) 08/17/2020 1220   BUN 45 (H) 12/07/2016 1300   BUN 29.0 (H) 03/18/2016 0944   CREATININE 3.03 (H) 06/01/2023 0510   CREATININE 2.23 (H) 04/15/2019 1201   CREATININE 2.6 (H) 12/07/2016 1300   CREATININE 2.1 (H) 03/18/2016 0944   CALCIUM 8.6 (L) 06/01/2023 0510   CALCIUM 9.9 12/07/2016 1300   CALCIUM 9.3 03/18/2016 0944   PROT 6.0 (L) 05/31/2023 0510   PROT 7.4 12/07/2016 1300   PROT 7.0 03/18/2016 0944   ALBUMIN 3.0 (L) 05/31/2023 0510   ALBUMIN 3.7 12/07/2016 1300   ALBUMIN 3.3 (L) 03/18/2016 0944   AST 53 (H) 05/31/2023 0510   AST 22 04/15/2019 1201   AST 22 03/18/2016 0944   ALT 18 05/31/2023 0510   ALT 11 04/15/2019 1201   ALT 29 12/07/2016 1300   ALT 19 03/18/2016 0944   ALKPHOS 73 05/31/2023 0510   ALKPHOS 127 (H) 12/07/2016 1300   ALKPHOS 147 03/18/2016 0944   BILITOT 0.6 05/31/2023 0510   BILITOT 1.0 04/15/2019 1201   BILITOT 0.32 03/18/2016 0944   GFR 35.47 (L) 09/30/2011 1028   EGFR 19 (L) 08/17/2020 1220   GFRNONAA 16 (L) 06/01/2023 0510   GFRNONAA 22 (L) 04/15/2019 1201    GFRNONAA 24 (L) 09/13/2012 1357    GFR Estimated Creatinine Clearance: 17.9 mL/min (A) (by C-G formula based on SCr of 3.03 mg/dL (H)). No results for input(s): "LIPASE", "AMYLASE" in the last 72 hours. No results for input(s): "CKTOTAL", "CKMB", "CKMBINDEX", "TROPONINI" in the last 72 hours. Invalid input(s): "POCBNP" Recent Labs    05/30/23 0226  DDIMER 1.87*   Recent Labs    05/30/23 0436  HGBA1C 5.4   No results for input(s): "CHOL", "HDL", "LDLCALC", "TRIG", "CHOLHDL", "LDLDIRECT" in the last 72 hours. No results for input(s): "TSH", "T4TOTAL", "T3FREE", "THYROIDAB" in the last 72 hours.  Invalid input(s): "FREET3" No  results for input(s): "VITAMINB12", "FOLATE", "FERRITIN", "TIBC", "IRON", "RETICCTPCT" in the last 72 hours. Coags: Recent Labs    05/29/23 2302  INR 1.5*   Microbiology: Recent Results (from the past 240 hours)  Culture, blood (Routine x 2)     Status: None (Preliminary result)   Collection Time: 05/29/23 11:02 PM   Specimen: BLOOD  Result Value Ref Range Status   Specimen Description BLOOD SITE NOT SPECIFIED  Final   Special Requests   Final    BOTTLES DRAWN AEROBIC AND ANAEROBIC Blood Culture results may not be optimal due to an inadequate volume of blood received in culture bottles   Culture   Final    NO GROWTH 3 DAYS Performed at [facility], [address] [place], [place] [address]    Report Status PENDING  Incomplete  Culture, blood (Routine x 2)     Status: None (Preliminary result)   Collection Time: 05/29/23 11:33 PM   Specimen: BLOOD  Result Value Ref Range Status   Specimen Description BLOOD LEFT ANTECUBITAL  Final   Special Requests   Final    BOTTLES DRAWN AEROBIC AND ANAEROBIC Blood Culture adequate volume   Culture   Final    NO GROWTH 3 DAYS Performed at [facility], [address], [place], [place] [address]    Report Status PENDING  Incomplete  Resp panel by RT-PCR (RSV, Flu A&B, Covid) Anterior Nasal Swab      Status: Abnormal   Collection Time: 05/29/23 11:33 PM   Specimen: Anterior Nasal Swab  Result Value Ref Range Status   SARS Coronavirus 2 by RT PCR NEGATIVE NEGATIVE Final   Influenza A by PCR POSITIVE (A) NEGATIVE Final   Influenza B by PCR NEGATIVE NEGATIVE Final    Comment: (NOTE) The Xpert Xpress SARS-CoV-2/FLU/RSV plus assay is intended as an aid in the diagnosis of influenza from Nasopharyngeal swab specimens and should not be used as a sole basis for treatment. Nasal washings and aspirates are unacceptable for Xpert Xpress SARS-CoV-2/FLU/RSV testing.  Fact Sheet for Patients: BloggerCourse.com  Fact Sheet for Healthcare Providers: SeriousBroker.it  This test is not yet approved or cleared by the [place] FDA and has been authorized for detection and/or diagnosis of SARS-CoV-2 by FDA under an Emergency Use Authorization (EUA). This EUA will remain in effect (meaning this test can be used) for the duration of the COVID-19 declaration under Section 564(b)(1) of the Act, 21 U.S.C. section 360bbb-3(b)(1), unless the authorization is terminated or revoked.     Resp Syncytial Virus by PCR NEGATIVE NEGATIVE Final    Comment: (NOTE) Fact Sheet for Patients: BloggerCourse.com  Fact Sheet for Healthcare Providers: SeriousBroker.it  This test is not yet approved or cleared by the [place] FDA and has been authorized for detection and/or diagnosis of SARS-CoV-2 by FDA under an Emergency Use Authorization (EUA). This EUA will remain in effect (meaning this test can be used) for the duration of the COVID-19 declaration under Section 564(b)(1) of the Act, 21 U.S.C. section 360bbb-3(b)(1), unless the authorization is terminated or revoked.  Performed at [facility], [address] [place], [place] [address]     FURTHER DISCHARGE INSTRUCTIONS:  Get Medicines  reviewed and adjusted: Please take all your medications with you for your next visit with your Primary MD  Laboratory/radiological data: Please request your Primary MD to go over all hospital tests and procedure/radiological results at the follow up, please ask your Primary MD to get  all Hospital records sent to his/her office.  In some cases, they will be blood work, cultures and biopsy results pending at the time of your discharge. Please request that your primary care M.D. goes through all the records of your hospital data and follows up on these results.  Also Note the following: If you experience worsening of your admission symptoms, develop shortness of breath, life threatening emergency, suicidal or homicidal thoughts you must seek medical attention immediately by calling 911 or calling your MD immediately  if symptoms less severe.  You must read complete instructions/literature along with all the possible adverse reactions/side effects for all the Medicines you take and that have been prescribed to you. Take any new Medicines after you have completely understood and accpet all the possible adverse reactions/side effects.   Do not drive when taking Pain medications or sleeping medications (Benzodaizepines)  Do not take more than prescribed Pain, Sleep and Anxiety Medications. It is not advisable to combine anxiety,sleep and pain medications without talking with your primary care practitioner  Special Instructions: If you have smoked or chewed Tobacco  in the last 2 yrs please stop smoking, stop any regular Alcohol  and or any Recreational drug use.  Wear Seat belts while driving.  Please note: You were cared for by a hospitalist during your hospital stay. Once you are discharged, your primary care physician will handle any further medical issues. Please note that NO REFILLS for any discharge medications will be authorized once you are discharged, as it is imperative that you return to  your primary care physician (or establish a relationship with a primary care physician if you do not have one) for your post hospital discharge needs so that they can reassess your need for medications and monitor your lab values.  Total Time spent coordinating discharge including counseling, education and face to face time equals greater than 30 minutes.  SignedJeoffrey Massed 06/01/2023 9:13 AM

## 2023-06-02 LAB — LEGIONELLA PNEUMOPHILA SEROGP 1 UR AG: L. pneumophila Serogp 1 Ur Ag: NEGATIVE

## 2023-06-03 LAB — CULTURE, BLOOD (ROUTINE X 2)
Culture: NO GROWTH
Culture: NO GROWTH
Special Requests: ADEQUATE

## 2023-06-07 ENCOUNTER — Telehealth: Payer: Self-pay

## 2023-06-07 LAB — GLUCOSE, CAPILLARY
Glucose-Capillary: 102 mg/dL — ABNORMAL HIGH (ref 70–99)
Glucose-Capillary: 105 mg/dL — ABNORMAL HIGH (ref 70–99)

## 2023-06-07 NOTE — Telephone Encounter (Signed)
Spoke with patient and she stated she was in the hospital with pneumonia/flu.  Requested to send remote transmission to review fluid levels since hospital discharge.  She tried to send a remote transmission and received error code.  She will call [employer] tech support for assistance.  ICM Remote Transmission rescheduled for 06/19/2023

## 2023-06-13 DIAGNOSIS — E559 Vitamin D deficiency, unspecified: Secondary | ICD-10-CM | POA: Diagnosis not present

## 2023-06-13 DIAGNOSIS — N184 Chronic kidney disease, stage 4 (severe): Secondary | ICD-10-CM | POA: Diagnosis not present

## 2023-06-13 DIAGNOSIS — I272 Pulmonary hypertension, unspecified: Secondary | ICD-10-CM | POA: Diagnosis not present

## 2023-06-13 DIAGNOSIS — I129 Hypertensive chronic kidney disease with stage 1 through stage 4 chronic kidney disease, or unspecified chronic kidney disease: Secondary | ICD-10-CM | POA: Diagnosis not present

## 2023-06-13 DIAGNOSIS — I428 Other cardiomyopathies: Secondary | ICD-10-CM | POA: Diagnosis not present

## 2023-06-13 DIAGNOSIS — Z8619 Personal history of other infectious and parasitic diseases: Secondary | ICD-10-CM | POA: Diagnosis not present

## 2023-06-13 DIAGNOSIS — E7800 Pure hypercholesterolemia, unspecified: Secondary | ICD-10-CM | POA: Diagnosis not present

## 2023-06-13 DIAGNOSIS — Z853 Personal history of malignant neoplasm of breast: Secondary | ICD-10-CM | POA: Diagnosis not present

## 2023-06-13 DIAGNOSIS — D6851 Activated protein C resistance: Secondary | ICD-10-CM | POA: Diagnosis not present

## 2023-06-13 DIAGNOSIS — M810 Age-related osteoporosis without current pathological fracture: Secondary | ICD-10-CM | POA: Diagnosis not present

## 2023-06-13 DIAGNOSIS — D649 Anemia, unspecified: Secondary | ICD-10-CM | POA: Diagnosis not present

## 2023-06-13 DIAGNOSIS — M109 Gout, unspecified: Secondary | ICD-10-CM | POA: Diagnosis not present

## 2023-06-14 ENCOUNTER — Other Ambulatory Visit: Payer: Self-pay | Admitting: Cardiovascular Disease

## 2023-06-19 ENCOUNTER — Ambulatory Visit
Admission: RE | Admit: 2023-06-19 | Discharge: 2023-06-19 | Disposition: A | Discharge status: Home / Self Care | Payer: 59 | Source: Ambulatory Visit | Attending: Family Medicine | Admitting: Family Medicine

## 2023-06-19 ENCOUNTER — Other Ambulatory Visit: Payer: Self-pay | Admitting: Family Medicine

## 2023-06-19 ENCOUNTER — Ambulatory Visit: Payer: 59 | Attending: Cardiovascular Disease

## 2023-06-19 DIAGNOSIS — Z1231 Encounter for screening mammogram for malignant neoplasm of breast: Secondary | ICD-10-CM

## 2023-06-19 DIAGNOSIS — Z95810 Presence of automatic (implantable) cardiac defibrillator: Secondary | ICD-10-CM

## 2023-06-19 DIAGNOSIS — I5042 Chronic combined systolic (congestive) and diastolic (congestive) heart failure: Secondary | ICD-10-CM

## 2023-06-20 ENCOUNTER — Telehealth: Payer: Self-pay

## 2023-06-20 NOTE — Telephone Encounter (Signed)
 Remote ICM transmission received.  Attempted call to patient regarding ICM remote transmission and left detailed message per DPR.  Left ICM phone number and advised to return call for any fluid symptoms or questions. Next ICM remote transmission scheduled 06/26/2023.

## 2023-06-20 NOTE — Progress Notes (Signed)
EPIC Encounter for ICM Monitoring  Patient Name: Patricia Morgan is a 73 y.o. female Date: 06/20/2023 Primary Care Physican: Tally Joe, MD Primary Cardiologist: Croitoru Electrophysiologist: Croitoru Nephrologist: Dr Thomes Cake at [facility]  07/15/2022  Weight: 185 lbs 08/14/2022 Weight: 197 lbs 08/23/2022   Weight: 194 lbs 09/19/2022   Weight: 194 lbs 01/04/2023 Weight: 190 lbs 02/07/2023 Weight: 192 lbs    Attempted call to patient and unable to reach.  Left detailed message per DPR regarding transmission.  Transmission results reviewed.    Diet:  She is not strict on salt intake but does limit fluid intake to 64 oz daily.   Optivol Thoracic impedance suggesting possible fluid accumulation starting 1/25 but trending back toward baseline.      Prescribed:  Furosemide 80 mg take 1 tablet(s) (80 mg total) by mouth daily.  She takes extra Lasix 40 mg when needed.     Labs: 03/23/2024 Creatinine 3.03, BUN 51, Potassium 3.9, Sodium 137, GFR 16  01/13/2024 Creatinine 3.36, BUN 57, Potassium 4.3, Sodium 139, GFR 14  11/18/2023 Creatinine 3.31, BUN 44, Potassium 4.2, Sodium 140, GFR 14  11/01/2023 Creatinine 3.59, BUN 59, Potassium 4.4, Sodium 141, GFR 13 09/21/2022 Creatinine 2.88, BUN 43, Potassium 4.1, Sodium 140, GFR 17 A complete set of results can be found in Results Review.   Recommendations:    Left voice mail with ICM number and encouraged to call if experiencing any fluid symptoms.   Follow-up plan: ICM clinic phone appointment on 06/26/2023 for 31 day & to recheck fluid levels.   91 day device clinic remote transmission 07/03/2023.     EP/Cardiology Office Visits:   Recall 08/27/2023 with Dr. Royann Shivers.     Copy of ICM check sent to Dr. Royann Shivers.  3 month ICM trend: 06/19/2023.    12-14 Month ICM trend:     Karie Soda, RN 06/20/2023 2:28 PM

## 2023-06-26 ENCOUNTER — Ambulatory Visit: Payer: 59 | Attending: Cardiovascular Disease

## 2023-06-26 DIAGNOSIS — Z95810 Presence of automatic (implantable) cardiac defibrillator: Secondary | ICD-10-CM | POA: Diagnosis not present

## 2023-06-26 DIAGNOSIS — I5042 Chronic combined systolic (congestive) and diastolic (congestive) heart failure: Secondary | ICD-10-CM | POA: Diagnosis not present

## 2023-06-30 NOTE — Progress Notes (Signed)
EPIC Encounter for ICM Monitoring  Patient Name: Patricia Morgan is a 73 y.o. female Date: 06/30/2023 Primary Care Physican: Tally Joe, MD Primary Cardiologist: Croitoru Electrophysiologist: Croitoru Nephrologist: Dr Thomes Cake at [facility]  07/15/2022  Weight: 185 lbs 08/14/2022 Weight: 197 lbs 08/23/2022   Weight: 194 lbs 09/19/2022   Weight: 194 lbs 01/04/2023 Weight: 190 lbs 02/07/2023 Weight: 192 lbs  06/30/2023 Weight:    Spoke with patient and heart failure questions reviewed.  Transmission results reviewed.  Pt asymptomatic for fluid accumulation.  Reports feeling well at this time and voices no complaints.     Diet:  She is not strict on salt intake but does limit fluid intake to 64 oz daily.   Optivol Thoracic impedance suggesting possible fluid accumulation starting 2/4 but trending back toward baseline.      Prescribed:  Furosemide 80 mg take 1 tablet(s) (80 mg total) by mouth daily.  She takes extra Lasix 40 mg when needed.     Labs: 03/24/2023 Creatinine 3.03, BUN 51, Potassium 3.9, Sodium 137, GFR 16  01/13/2023 Creatinine 3.36, BUN 57, Potassium 4.3, Sodium 139, GFR 14  11/18/2022 Creatinine 3.31, BUN 44, Potassium 4.2, Sodium 140, GFR 14  11/01/2022 Creatinine 3.59, BUN 59, Potassium 4.4, Sodium 141, GFR 13 09/21/2022 Creatinine 2.88, BUN 43, Potassium 4.1, Sodium 140, GFR 17 A complete set of results can be found in Results Review.   Recommendations: Recommendation to limit salt intake to 2000 mg daily and fluid intake to 64 oz daily.  Encouraged to call if experiencing any fluid symptoms.    Follow-up plan: ICM clinic phone appointment on 07/31/2023.   91 day device clinic remote transmission 07/03/2023.     EP/Cardiology Office Visits:   Recall 08/27/2023 with Dr. Royann Shivers.     Copy of ICM check sent to Dr. Royann Shivers.  3 month ICM trend: 06/26/2023.    12-14 Month ICM trend:     Karie Soda, RN 06/30/2023 2:02 PM

## 2023-07-03 ENCOUNTER — Ambulatory Visit (INDEPENDENT_AMBULATORY_CARE_PROVIDER_SITE_OTHER): Payer: 59

## 2023-07-03 DIAGNOSIS — I428 Other cardiomyopathies: Secondary | ICD-10-CM | POA: Diagnosis not present

## 2023-07-03 DIAGNOSIS — I5042 Chronic combined systolic (congestive) and diastolic (congestive) heart failure: Secondary | ICD-10-CM

## 2023-07-04 LAB — CUP PACEART REMOTE DEVICE CHECK
Battery Remaining Longevity: 79 mo
Battery Voltage: 2.99 V
Brady Statistic RV Percent Paced: 0 %
Date Time Interrogation Session: 20250217012203
HighPow Impedance: 73 Ohm
Implantable Lead Connection Status: 753985
Implantable Lead Implant Date: 20191030
Implantable Lead Location: 753860
Implantable Pulse Generator Implant Date: 20191030
Lead Channel Impedance Value: 475 Ohm
Lead Channel Impedance Value: 589 Ohm
Lead Channel Pacing Threshold Amplitude: 0.75 V
Lead Channel Pacing Threshold Pulse Width: 0.4 ms
Lead Channel Sensing Intrinsic Amplitude: 25.875 mV
Lead Channel Sensing Intrinsic Amplitude: 25.875 mV
Lead Channel Setting Pacing Amplitude: 2 V
Lead Channel Setting Pacing Pulse Width: 0.4 ms
Lead Channel Setting Sensing Sensitivity: 0.3 mV
Zone Setting Status: 755011
Zone Setting Status: 755011

## 2023-07-05 ENCOUNTER — Encounter: Payer: Self-pay | Admitting: Cardiovascular Disease

## 2023-07-17 DIAGNOSIS — H6122 Impacted cerumen, left ear: Secondary | ICD-10-CM | POA: Diagnosis not present

## 2023-07-29 ENCOUNTER — Other Ambulatory Visit: Payer: Self-pay | Admitting: Cardiovascular Disease

## 2023-07-31 ENCOUNTER — Ambulatory Visit: Payer: 59 | Attending: Cardiovascular Disease

## 2023-07-31 DIAGNOSIS — Z95810 Presence of automatic (implantable) cardiac defibrillator: Secondary | ICD-10-CM | POA: Diagnosis not present

## 2023-07-31 DIAGNOSIS — I5042 Chronic combined systolic (congestive) and diastolic (congestive) heart failure: Secondary | ICD-10-CM

## 2023-07-31 DIAGNOSIS — N184 Chronic kidney disease, stage 4 (severe): Secondary | ICD-10-CM | POA: Diagnosis not present

## 2023-08-02 ENCOUNTER — Telehealth: Payer: Self-pay

## 2023-08-02 NOTE — Progress Notes (Signed)
 EPIC Encounter for ICM Monitoring  Patient Name: Patricia Morgan is a 73 y.o. female Date: 08/02/2023 Primary Care Physican: Tally Joe, MD Primary Cardiologist: Croitoru Electrophysiologist: Croitoru Nephrologist: Dr Thomes Cake at [facility]  07/15/2022  Weight: 185 lbs 08/14/2022 Weight: 197 lbs 08/23/2022   Weight: 194 lbs 09/19/2022   Weight: 194 lbs 01/04/2023 Weight: 190 lbs 02/07/2023 Weight: 192 lbs  06/30/2023 Weight: 192 lbs   Attempted call to patient and unable to reach.  Left detailed message per DPR regarding transmission.  Transmission results reviewed.     Diet:  She is not strict on salt intake but does limit fluid intake to 64 oz daily.   Optivol Thoracic impedance suggesting intermittent days with possible fluid accumulation within the last month.      Prescribed:  Furosemide 80 mg take 1 tablet(s) (80 mg total) by mouth daily.  She takes extra Lasix 40 mg when needed.     Labs: 03/24/2023 Creatinine 3.03, BUN 51, Potassium 3.9, Sodium 137, GFR 16  01/13/2023 Creatinine 3.36, BUN 57, Potassium 4.3, Sodium 139, GFR 14  11/18/2022 Creatinine 3.31, BUN 44, Potassium 4.2, Sodium 140, GFR 14  11/01/2022 Creatinine 3.59, BUN 59, Potassium 4.4, Sodium 141, GFR 13 09/21/2022 Creatinine 2.88, BUN 43, Potassium 4.1, Sodium 140, GFR 17 A complete set of results can be found in Results Review.   Recommendations: Left voice mail with ICM number and encouraged to call if experiencing any fluid symptoms.   Follow-up plan: ICM clinic phone appointment on 09/04/2023.   91 day device clinic remote transmission 10/02/2023.     EP/Cardiology Office Visits:   09/07/2023 with Dr. Royann Shivers.     Copy of ICM check sent to Dr. Royann Shivers.  3 month ICM trend: 07/31/2023.    12-14 Month ICM trend:     Karie Soda, RN 08/02/2023 2:06 PM

## 2023-08-02 NOTE — Telephone Encounter (Signed)
 Remote ICM transmission received.  Attempted call to patient regarding ICM remote transmission and left detailed message per DPR.  Left ICM phone number and advised to return call for any fluid symptoms or questions. Next ICM remote transmission scheduled 09/04/2023.

## 2023-08-10 NOTE — Progress Notes (Signed)
 Remote ICD transmission.

## 2023-08-10 NOTE — Addendum Note (Signed)
 Addended by: Geralyn Flash D on: 08/10/2023 03:13 PM   Modules accepted: Orders

## 2023-08-13 ENCOUNTER — Other Ambulatory Visit: Payer: Self-pay | Admitting: Cardiovascular Disease

## 2023-08-19 DIAGNOSIS — G4733 Obstructive sleep apnea (adult) (pediatric): Secondary | ICD-10-CM | POA: Diagnosis not present

## 2023-08-21 ENCOUNTER — Other Ambulatory Visit: Payer: Self-pay | Admitting: Cardiovascular Disease

## 2023-09-04 ENCOUNTER — Ambulatory Visit: Attending: Cardiovascular Disease

## 2023-09-04 DIAGNOSIS — I5042 Chronic combined systolic (congestive) and diastolic (congestive) heart failure: Secondary | ICD-10-CM

## 2023-09-04 DIAGNOSIS — Z95810 Presence of automatic (implantable) cardiac defibrillator: Secondary | ICD-10-CM | POA: Diagnosis not present

## 2023-09-04 NOTE — Progress Notes (Signed)
 Brigitte Canard, PA-C [address] [place], [place]  [address] Phone: [phone]   Gastroenterology Consultation  Referring Provider:     Rae Bugler, MD Primary Care Physician:  Rae Bugler, MD Primary Gastroenterologist:  Brigitte Canard, PA-C / Legrand Puma, MD  Reason for Consultation:     Discuss repeat Colonoscopy; follow-up from diverticulitis.        HPI:   Patricia Morgan is a 72 y.o. y/o female referred for consultation & management  by Rae Bugler, MD. Has history of adenomatous colon polyps and is here to discuss repeat colonoscopy.  Also follow-up of diverticulitis.  03/2023 she had acute lower abdominal pain with nausea, vomiting, diarrhea, and low-grade fever.  She went to med [facility].  Abdominal pelvic CT showed mild sigmoid diverticulitis.  No perforation or abscess.  Treated with Augmentin  antibiotic and her symptoms resolved.  That was her first and last episode of diverticulitis.  No current abdominal pain.  She denies weight loss, rectal bleeding, or any current GI symptoms.  05/30/2023: Repeat abdominal pelvic CT without contrast showed resolution of previous diverticulitis.  No colon masses or other pathology.  There was incidental right lower lobe pneumonia which was treated during hospitalization.  Medical history of CHF, nonischemic cardiomyopathy, pacemaker in place, stage IV CKD, factor V Leiden, on Eliquis , OSA on CPAP, diabetes, HTN, history of PE in 2011, history of breast cancer in 1994.  Echo 10/2020 showed LVEF 30%, moderately decreased function.  Cardiologist Dr. Alvis Ba.  Has nephrologist at [facility].  11/2016 last Colonoscopy by Dr. Elvin Hammer: 2 small 4 mm to 5 mm tubular adenoma polyps removed.  Sigmoid diverticulosis.  Internal hemorrhoids.  Excellent prep with Suprep.  Dr. Elvin Hammer did not recommend any further colonoscopies due to advanced age and multiple comorbidities.  07/31/2023 labs: WBC 3.8, hemoglobin 10.8, MCV 95, platelet  163.  06/01/2023 labs: BUN 59, creatinine 3.03, GFR 16, sodium 130, potassium 3.7.  Past Medical History:  Diagnosis Date   ANEMIA-NOS    BREAST CANCER, HX OF 1994 & 1995   1994>>recurrence in 1995, R breast, s/p lumpectomy>>mastectomy   Cancer (HCC)    Cardiomyopathy, nonischemic (HCC)    CHF (congestive heart failure) (HCC)    chronic systolic and diastolic CHF   CHRONIC KIDNEY DISEASE STAGE III (MODERATE)    Clotting disorder (HCC)    on eliquis  for Factor 5 disorder   Colon polyps    DIABETES MELLITUS, TYPE II    Diverticulosis    HYPERLIPIDEMIA    HYPERTENSION    ILD (interstitial lung disease) (HCC)    10/2018 CT findings suggestive of sarcoidosis; followed by Dr. Villa Greaser   OBSTRUCTIVE SLEEP APNEA    CPAP   Personal history of chemotherapy    Proteinuria    PULMONARY EMBOLISM 09/24/2008   anticoag thru 03/2010   Rotator cuff tear    Left   Shoulder impingement, left    Sleep apnea    Wears CPAP nightly    Past Surgical History:  Procedure Laterality Date   BREAST SURGERY     CARDIAC CATHETERIZATION N/A 11/02/2015   Procedure: Right/Left Heart Cath and Coronary Angiography;  Surgeon: Odie Benne, MD;  Location: [facility] INVASIVE CV LAB;  Service: Cardiovascular;  Laterality: N/A;   DILATION AND CURETTAGE OF UTERUS     ICD IMPLANT N/A 03/14/2018   Procedure: ICD IMPLANT;  Surgeon: Luana Rumple, MD;  Location: [facility] INVASIVE CV LAB;  Service: Cardiovascular;  Laterality: N/A;  insertion port a cath     MASTECTOMY Right 1995   Right   PORT-A-CATH REMOVAL     SHOULDER ARTHROSCOPY WITH ROTATOR CUFF REPAIR AND SUBACROMIAL DECOMPRESSION Left 01/24/2019   Procedure: SHOULDER ARTHROSCOPY WITH ROTATOR CUFF REPAIR AND SUBACROMIAL DECOMPRESSION;  Surgeon: Janeth Medicus, MD;  Location: [facility] OR;  Service: Orthopedics;  Laterality: Left;  2.5 hrs    Prior to Admission medications   Medication Sig Start Date End Date Taking? Authorizing Provider  acetaminophen  (TYLENOL )  500 MG tablet Take 500 mg by mouth every 6 (six) hours as needed for mild pain (pain score 1-3).    [provider]  allopurinol  (ZYLOPRIM ) 100 MG tablet Take 100 mg by mouth 2 (two) times daily.     [provider]  benzonatate  (TESSALON ) 100 MG capsule Take 1 capsule (100 mg total) by mouth 3 (three) times daily as needed for cough. 06/01/23   Ghimire, Estil Heman, MD  carvedilol  (COREG ) 25 MG tablet TAKE 1 TABLET BY MOUTH TWICE  DAILY 02/08/23   Croitoru, Mihai, MD  Cholecalciferol (VITAMIN D-3) 5000 units TABS Take 5,000 Units by mouth daily.     [provider]  diphenhydrAMINE  (BENADRYL ) 25 MG tablet Take 25 mg by mouth at bedtime.    [provider]  ELIQUIS  5 MG TABS tablet TAKE 1 TABLET TWICE DAILY Patient taking differently: Take 5 mg by mouth at bedtime. 05/09/19   Ivor Mars, MD  Ferrous Gluconate (IRON ) 240 (27 Fe) MG TABS 1 tablet Orally twice a day    [provider]  Fluocinolone  Acetonide 0.01 % OIL Place 5 drops in ear(s) daily. Patient taking differently: Place 5 drops in ear(s) as needed. 06/15/21   Eloise Hake Scales, PA-C  furosemide  (LASIX ) 80 MG tablet Take 80 mg by mouth daily. 05/23/22   [provider]  hydrALAZINE  (APRESOLINE ) 10 MG tablet Take 1 tablet (10 mg total) by mouth 3 (three) times daily. Please keep scheduled appointment for future refills. Thank you. 08/14/23   Croitoru, Karyl Paget, MD  isosorbide  mononitrate (IMDUR ) 30 MG 24 hr tablet TAKE 1 TABLET BY MOUTH DAILY 08/01/23   Croitoru, Mihai, MD  rosuvastatin  (CRESTOR ) 10 MG tablet TAKE 1 TABLET BY MOUTH DAILY Patient taking differently: Take 10 mg by mouth at bedtime. 04/21/23   Croitoru, Mihai, MD  vitamin B-12 (CYANOCOBALAMIN) 1000 MCG tablet Take 1,000 mcg by mouth daily.    [provider]  vitamin E 400 UNIT capsule Take 400 Units by mouth daily.    [provider]  fluticasone  (FLONASE ) 50 MCG/ACT nasal spray Place 2 sprays into the nose  daily. 12/16/10 08/12/19  Carolene Chute, MD    Family History  Problem Relation Age of Onset   Parkinson's disease Mother    Asthma Father    Diabetes Sister    Colon cancer Neg Hx    Stomach cancer Neg Hx    Esophageal cancer Neg Hx    Rectal cancer Neg Hx    Liver cancer Neg Hx      Social History   Tobacco Use   Smoking status: Never   Smokeless tobacco: Never   Tobacco comments:    Lives alone-divorced  Vaping Use   Vaping status: Never Used  Substance Use Topics   Alcohol  use: Yes    Alcohol /week: 0.0 standard drinks of alcohol     Comment: occ   Drug use: No    Allergies as of 09/05/2023   (No Known Allergies)  Review of Systems:    All systems reviewed and negative except where noted in HPI.   Physical Exam:  BP 130/78 (Cuff Size: Normal)   Pulse 67   Ht 5\' 2"  (1.575 m)   Wt 199 lb 12.8 oz (90.6 kg)   BMI 36.54 kg/m  No LMP recorded. Patient is postmenopausal.  General:   Alert,  Well-developed, well-nourished, pleasant and cooperative in NAD Lungs:  Respirations even and unlabored.  Clear throughout to auscultation.   No wheezes, crackles, or rhonchi. No acute distress. Heart:  Regular rate and rhythm; no murmurs, clicks, rubs, or gallops. Abdomen:  Normal bowel sounds.  No bruits.  Soft, and non-distended without masses, hepatosplenomegaly or hernias noted.  No Tenderness.  No guarding or rebound tenderness.    Neurologic:  Alert and oriented x3;  grossly normal neurologically. Psych:  Alert and cooperative. Normal mood and affect.  Imaging Studies: No results found.  Assessment and Plan:   Patricia Morgan is a 73 y.o. y/o female has been referred for:  1.  History of adenomatous colon polyps -2 small tubular adenoma polyps removed 11/2016.  Dr. Elvin Hammer did not recommend any further colonoscopies due to advanced age and multiple comorbidities.  We discussed risk and benefits of colonoscopy at length.  She would be at increased risk for  colonoscopy procedure given her comorbidities.  I believe risk would outweigh the benefit in her case.  Patient agrees with not repeating a colonoscopy.   2.  History of mild uncomplicated diverticulitis -confirmed on CT 03/2023.  Treated with Augmentin .  Resolved.  Follow-up CT 05/2023 showed no diverticulitis.  No abdominal tenderness on exam today.    Reassurance.  Patient education discussed.  Follow-up if she has recurrent lower abdominal pain.  3.  Multiple comorbidities:  CHF, nonischemic cardiomyopathy, pacemaker in place, stage IV CKD, factor V Leiden, on Eliquis , OSA on CPAP, diabetes, HTN, history of PE in 2011, history of breast cancer in 1994.  Echo 10/2020 showed LVEF 30%, moderately decreased function.  Cardiologist Dr. Alvis Ba.  Has nephrologist at [facility].  Follow up As Needed if recurrent GI symptoms.  Brigitte Canard, PA-C

## 2023-09-05 ENCOUNTER — Encounter: Payer: Self-pay | Admitting: Physician Assistant

## 2023-09-05 ENCOUNTER — Ambulatory Visit (INDEPENDENT_AMBULATORY_CARE_PROVIDER_SITE_OTHER): Admitting: Physician Assistant

## 2023-09-05 VITALS — BP 130/78 | HR 67 | Ht 62.0 in | Wt 199.8 lb

## 2023-09-05 DIAGNOSIS — Z860100 Personal history of colon polyps, unspecified: Secondary | ICD-10-CM

## 2023-09-05 DIAGNOSIS — Z8719 Personal history of other diseases of the digestive system: Secondary | ICD-10-CM

## 2023-09-05 DIAGNOSIS — Z01818 Encounter for other preprocedural examination: Secondary | ICD-10-CM | POA: Diagnosis not present

## 2023-09-05 DIAGNOSIS — Z860101 Personal history of adenomatous and serrated colon polyps: Secondary | ICD-10-CM | POA: Diagnosis not present

## 2023-09-05 NOTE — Patient Instructions (Signed)
 Please follow up as needed.  Thank you for entrusting me with your care and for choosing [facility], Brigitte Canard, [place]   If your blood pressure at your visit was 140/90 or greater, please contact your primary care physician to follow up on this. ______________________________________________________  If you are age 75 or older, your body mass index should be between 23-30. Your Body mass index is 36.54 kg/m. If this is out of the aforementioned range listed, please consider follow up with your Primary Care Provider.  If you are age 74 or younger, your body mass index should be between 19-25. Your Body mass index is 36.54 kg/m. If this is out of the aformentioned range listed, please consider follow up with your Primary Care Provider.  ________________________________________________________  The [facility] providers would like to encourage you to use MYCHART to communicate with providers for non-urgent requests or questions.  Due to long hold times on the telephone, sending your provider a message by [facility] may be a faster and more efficient way to get a response.  Please allow 48 business hours for a response.  Please remember that this is for non-urgent requests.  _______________________________________________________  Due to recent changes in healthcare laws, you may see the results of your imaging and laboratory studies on MyChart before your provider has had a chance to review them.  We understand that in some cases there may be results that are confusing or concerning to you. Not all laboratory results come back in the same time frame and the provider may be waiting for multiple results in order to interpret others.  Please give us  48 hours in order for your provider to thoroughly review all the results before contacting the office for clarification of your results.

## 2023-09-05 NOTE — Progress Notes (Signed)
 Reviewed. Colonoscopy is not indicated or needed in this high risk patient with resolved uncomplicated diverticulitis. Thanks, Dr. Elvin Hammer

## 2023-09-06 ENCOUNTER — Other Ambulatory Visit: Payer: Self-pay | Admitting: Cardiovascular Disease

## 2023-09-06 NOTE — Progress Notes (Signed)
 EPIC Encounter for ICM Monitoring  Patient Name: Patricia Morgan is a 73 y.o. female Date: 09/06/2023 Primary Care Physican: Rae Bugler, MD Primary Cardiologist: Croitoru Electrophysiologist: Croitoru Nephrologist: Dr Manette Section at [facility]  07/15/2022  Weight: 185 lbs 08/14/2022 Weight: 197 lbs 08/23/2022   Weight: 194 lbs 09/19/2022   Weight: 194 lbs 01/04/2023 Weight: 190 lbs 02/07/2023 Weight: 192 lbs  06/30/2023 Weight: 192 lbs 09/06/2023 Weight: 192-194 lbs   Spoke with patient and heart failure questions reviewed.  Transmission results reviewed.  Pt asymptomatic for fluid accumulation.  Reports feeling well at this time and voices no complaints.      Diet:  She is not strict on salt intake but does limit fluid intake to 64 oz daily.   Optivol Thoracic impedance suggesting possible fluid accumulation starting 4/7 and returned to baseline 4/20.      Prescribed:  Furosemide  80 mg take 1 tablet(s) (80 mg total) by mouth daily.  She takes extra Lasix  40 mg when needed.     Labs: 03/24/2023 Creatinine 3.03, BUN 51, Potassium 3.9, Sodium 137, GFR 16  01/13/2023 Creatinine 3.36, BUN 57, Potassium 4.3, Sodium 139, GFR 14  11/18/2022 Creatinine 3.31, BUN 44, Potassium 4.2, Sodium 140, GFR 14  11/01/2022 Creatinine 3.59, BUN 59, Potassium 4.4, Sodium 141, GFR 13 09/21/2022 Creatinine 2.88, BUN 43, Potassium 4.1, Sodium 140, GFR 17 A complete set of results can be found in Results Review.   Recommendations:  No changes and encouraged to call if experiencing any fluid symptoms.   Follow-up plan: ICM clinic phone appointment on 10/10/2023.   91 day device clinic remote transmission 10/02/2023.     EP/Cardiology Office Visits:   09/07/2023 with Dr. Alvis Ba.     Copy of ICM check sent to Dr. Alvis Ba.  3 month ICM trend: 09/04/2023.    12-14 Month ICM trend:     Almyra Jain, RN 09/06/2023 3:58 PM

## 2023-09-06 NOTE — Progress Notes (Signed)
 Spoke to patient and advised that per Dr Elvin Hammer, she does NOT need colonoscopy at this time. Patient verbalizes understanding and is in agreement with this recommendation.

## 2023-09-07 ENCOUNTER — Encounter: Payer: Self-pay | Admitting: Cardiovascular Disease

## 2023-09-07 ENCOUNTER — Ambulatory Visit: Payer: 59 | Attending: Cardiovascular Disease | Admitting: Cardiovascular Disease

## 2023-09-07 VITALS — BP 132/72 | HR 70 | Ht 63.0 in | Wt 203.6 lb

## 2023-09-07 DIAGNOSIS — I10 Essential (primary) hypertension: Secondary | ICD-10-CM

## 2023-09-07 DIAGNOSIS — I5042 Chronic combined systolic (congestive) and diastolic (congestive) heart failure: Secondary | ICD-10-CM

## 2023-09-07 DIAGNOSIS — Z6835 Body mass index (BMI) 35.0-35.9, adult: Secondary | ICD-10-CM

## 2023-09-07 DIAGNOSIS — R9431 Abnormal electrocardiogram [ECG] [EKG]: Secondary | ICD-10-CM

## 2023-09-07 DIAGNOSIS — E1169 Type 2 diabetes mellitus with other specified complication: Secondary | ICD-10-CM | POA: Diagnosis not present

## 2023-09-07 DIAGNOSIS — E785 Hyperlipidemia, unspecified: Secondary | ICD-10-CM | POA: Diagnosis not present

## 2023-09-07 DIAGNOSIS — N184 Chronic kidney disease, stage 4 (severe): Secondary | ICD-10-CM | POA: Diagnosis not present

## 2023-09-07 DIAGNOSIS — I428 Other cardiomyopathies: Secondary | ICD-10-CM | POA: Diagnosis not present

## 2023-09-07 DIAGNOSIS — Z95810 Presence of automatic (implantable) cardiac defibrillator: Secondary | ICD-10-CM

## 2023-09-07 DIAGNOSIS — E669 Obesity, unspecified: Secondary | ICD-10-CM

## 2023-09-07 NOTE — Patient Instructions (Signed)
 Medication Instructions:  No changes *If you need a refill on your cardiac medications before your next appointment, please call your pharmacy*  Follow-Up: At [facility], you and your health needs are our priority.  As part of our continuing mission to provide you with exceptional heart care, our providers are all part of one team.  This team includes your primary Cardiologist (physician) and Advanced Practice Providers or APPs (Physician Assistants and Nurse Practitioners) who all work together to provide you with the care you need, when you need it.  Your next appointment:   1 year(s)  Provider:   Thurmon Fair, MD     We recommend signing up for the patient portal called "MyChart".  Sign up information is provided on this After Visit Summary.  MyChart is used to connect with patients for Virtual Visits (Telemedicine).  Patients are able to view lab/test results, encounter notes, upcoming appointments, etc.  Non-urgent messages can be sent to your provider as well.   To learn more about what you can do with MyChart, go to [email or web address].        1st Floor: - Lobby - Registration  - Pharmacy  - Lab - Cafe  2nd Floor: - PV Lab - Diagnostic Testing (echo, CT, nuclear med)  3rd Floor: - Vacant  4th Floor: - TCTS (cardiothoracic surgery) - AFib Clinic - Structural Heart Clinic - Vascular Surgery  - Vascular Ultrasound  5th Floor: - HeartCare Cardiology (general and EP) - Clinical Pharmacy for coumadin, hypertension, lipid, weight-loss medications, and med management appointments    Valet parking services will be available as well.

## 2023-09-07 NOTE — Progress Notes (Unsigned)
 Cardiology Office Note    Date:  09/08/2023   ID:  Patricia Morgan, DOB 28-Dec-1950, MRN [id number]  PCP:  Rae Bugler, MD  Cardiologist:   Luana Rumple, MD   No chief complaint on file.   History of Present Illness:  Patricia Morgan is a 73 y.o. 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  She has a single-chamber Medtronic Visia ICD with AF detection capability.  She has had a more challenging year.  She had influenza followed by pneumonia and was hospitalized in January.  Volume status and kidney function has been more variable during this period of time.  Recently her OptiVol suggested a little bit of volume overload, but with advice from Gretchen Leavell based on her thoracic impedance, diuretics were adjusted and the OptiVol is almost back in normal range on the check today.  She has not had problems with chest pain or shortness of breath at rest with activity and has only mild ankle swelling.  She does not have orthopnea or PND.  Able to climb to her second story apartment.  Her weight today is 203 pounds, roughly 5 pounds higher than last year.  Her home scale shows weights that are 7-8 pounds lower since she has a heavy breast prosthesis that she does not wear at home.  Device interrogation today shows normal function.  Her single-lead Medtronic Visio AF device was implanted in October 27, 2017 and still has roughly 6 years of remaining generator longevity.  All lead parameters are excellent.  Presenting rhythm was ventricular sensed (normal sinus rhythm).  She does not require pacing.  She has not had any episodes of high ventricular rate or atrial fibrillation.  Her nephrologist is at [facility], Dr. Manette Section.  Her  hospital discharge creatinine on 06/01/2023 was 3.03, compatible with our previous estimation of her baseline of 2.8-3.1 (GFR around 15).  Most recent hemoglobin is 10.8.  Glycemic control is excellent with most recent hemoglobin A1c 5.9% on 07/31/2023.  Her most recent echo performed in 06/14/22showed severely decreased left ventricular systolic function with an ejection fraction of 30%.  A nuclear stress test was performed in 10-27-2017 and shows a fixed inferior wall defect that I suspect is diaphragmatic attenuation artifact.  There was no reversible ischemia.  A similar EF of 22% was reported.  Cardiac catheterization in 10-28-15 did not show any evidence of coronary stenoses.  PA pressure was 57/18 in the setting of a mean pulmonary wedge pressure of 33 mmHg.   She sees Dr. Lars Poche at [facility] for chronic kidney disease and her baseline creatinine is around 2.7-3.1. Her primary care physician is Dr. Rae Bugler. Dr. Villa Greaser manages her sleep apnea treatment.  Her sister Dia Forget had coronary artery disease and long QT syndrome and passed away in October 27, 2017.  Past Medical History:  Diagnosis Date   ANEMIA-NOS    BREAST CANCER, HX OF 1994 & 1995   1994>>recurrence in 10-27-1993, R breast, s/p lumpectomy>>mastectomy   Cancer (HCC)    Cardiomyopathy, nonischemic (HCC)    CHF (congestive heart failure) (HCC)    chronic systolic and diastolic CHF   CHRONIC KIDNEY DISEASE STAGE III (MODERATE)    Clotting disorder (HCC)    on eliquis  for Factor 5 disorder   Colon polyps    DIABETES MELLITUS, TYPE II  Diverticulosis    HYPERLIPIDEMIA    HYPERTENSION    ILD (interstitial lung disease) (HCC)    10/2018 CT findings suggestive of sarcoidosis; followed by Dr. Villa Greaser   OBSTRUCTIVE SLEEP APNEA    CPAP   Personal history of chemotherapy    Proteinuria    PULMONARY EMBOLISM 09/24/2008   anticoag thru 03/2010   Rotator cuff tear    Left   Shoulder impingement, left    Sleep apnea    Wears CPAP nightly     Past Surgical History:  Procedure Laterality Date   BREAST SURGERY     CARDIAC CATHETERIZATION N/A 11/02/2015   Procedure: Right/Left Heart Cath and Coronary Angiography;  Surgeon: Odie Benne, MD;  Location: [facility] INVASIVE CV LAB;  Service: Cardiovascular;  Laterality: N/A;   DILATION AND CURETTAGE OF UTERUS     ICD IMPLANT N/A 03/14/2018   Procedure: ICD IMPLANT;  Surgeon: Luana Rumple, MD;  Location: [facility] INVASIVE CV LAB;  Service: Cardiovascular;  Laterality: N/A;   insertion port a cath     MASTECTOMY Right 1995   Right   PORT-A-CATH REMOVAL     SHOULDER ARTHROSCOPY WITH ROTATOR CUFF REPAIR AND SUBACROMIAL DECOMPRESSION Left 01/24/2019   Procedure: SHOULDER ARTHROSCOPY WITH ROTATOR CUFF REPAIR AND SUBACROMIAL DECOMPRESSION;  Surgeon: Janeth Medicus, MD;  Location: [facility] OR;  Service: Orthopedics;  Laterality: Left;  2.5 hrs    Current Medications: Outpatient Medications Prior to Visit  Medication Sig Dispense Refill   acetaminophen  (TYLENOL ) 500 MG tablet Take 500 mg by mouth every 6 (six) hours as needed for mild pain (pain score 1-3).     allopurinol  (ZYLOPRIM ) 100 MG tablet Take 100 mg by mouth 2 (two) times daily.     carvedilol  (COREG ) 25 MG tablet TAKE 1 TABLET BY MOUTH TWICE  DAILY 120 tablet 4   Cholecalciferol (VITAMIN D-3) 5000 units TABS Take 5,000 Units by mouth daily.      diphenhydrAMINE  (BENADRYL ) 25 MG tablet Take 25 mg by mouth at bedtime as needed.     ELIQUIS  5 MG TABS tablet TAKE 1 TABLET TWICE DAILY (Patient taking differently: Take 5 mg by mouth 2 (two) times daily.) 180 tablet 6   Ferrous Gluconate (IRON ) 240 (27 Fe) MG TABS Take 1 tablet by mouth daily at 6 (six) AM.     Fluocinolone  Acetonide 0.01 % OIL Place 5 drops in ear(s) daily. (Patient taking differently: Place 5 drops in ear(s) as needed.) 20 mL 0   furosemide  (LASIX ) 80 MG tablet Take 80 mg by mouth daily.     isosorbide  mononitrate (IMDUR ) 30 MG 24 hr tablet TAKE 1 TABLET BY MOUTH  DAILY 90 tablet 0   rosuvastatin  (CRESTOR ) 10 MG tablet TAKE 1 TABLET BY MOUTH DAILY (Patient taking differently: Take 10 mg by mouth at bedtime.) 100 tablet 1   vitamin B-12 (CYANOCOBALAMIN) 1000 MCG tablet Take 1,000 mcg by mouth daily.     vitamin E 400 UNIT capsule Take 400 Units by mouth daily.     hydrALAZINE  (APRESOLINE ) 10 MG tablet Take 1 tablet (10 mg total) by mouth 3 (three) times daily. Please keep scheduled appointment for future refills. Thank you. (Patient taking differently: Take 10 mg by mouth 3 (three) times daily.) 90 tablet 0   No facility-administered medications prior to visit.     Allergies:   Patient has no known allergies.    Family History:  The patient's family history includes Asthma in her father; Diabetes in her sister; Parkinson's  disease in her mother. Her sister had long QT syndrome, but no documented ventricular arrhythmia   PHYSICAL EXAM:   VS:  BP 132/72   Pulse 70   Ht 5\' 3"  (1.6 m)   Wt 203 lb 9.6 oz (92.4 kg)   SpO2 95%   BMI 36.07 kg/m      General: Alert, oriented x3, no distress, moderately obese Head: no evidence of trauma, PERRL, EOMI, no exophtalmos or lid lag, no myxedema, no xanthelasma; normal ears, nose and oropharynx Neck: 7 - 8 cm elevation in jugular venous pulsations and prompt hepatojugular reflux; brisk carotid pulses without delay and no carotid bruits Chest: clear to auscultation, no signs of consolidation by percussion or palpation, normal fremitus, symmetrical and full respiratory excursions Cardiovascular: normal position and quality of the apical impulse, regular rhythm, normal first and second heart sounds, no murmurs, rubs or gallops Abdomen: no tenderness or distention, no masses by palpation, no abnormal pulsatility or arterial bruits, normal bowel sounds, no hepatosplenomegaly Extremities: 1-2+ symmetrical ankle edema Neurological: grossly nonfocal Psych: Normal mood and affect     Wt Readings from Last 3  Encounters:  09/07/23 203 lb 9.6 oz (92.4 kg)  09/05/23 199 lb 12.8 oz (90.6 kg)  05/30/23 217 lb 13 oz (98.8 kg)      Studies/Labs Reviewed:   EKG:   EKG Interpretation Date/Time:  Thursday September 07 2023 11:08:51 EDT Ventricular Rate:  70 PR Interval:  248 QRS Duration:  106 QT Interval:  430 QTC Calculation: 464 R Axis:   -25  Text Interpretation: Sinus rhythm with 1st degree A-V block Moderate voltage criteria for LVH, may be normal variant ( R in aVL , Cornell product ) T wave abnormality, consider lateral ischemia When compared with ECG of 29-May-2023 23:36, No significant change since last tracing Confirmed by Karenann Mcgrory [phone]) on 09/07/2023 11:39:45 AM       Recent Labs: 05/29/2023: B Natriuretic Peptide 1,391.9 05/31/2023: ALT 18 06/01/2023: BUN 59; Creatinine, Ser 3.03; Hemoglobin 9.5; Platelets 115; Potassium 3.7; Sodium 130   06/13/2023 Cholesterol 123, HDL 38, LDL 60, triglycerides 146, hemoglobin A1c 5.4%, hemoglobin 10.9, potassium 4.4, ALT 11, TSH 3.12  Lipid Panel    Component Value Date/Time   CHOL 184 09/13/2012 1357   TRIG 142 09/13/2012 1357   HDL 45 09/13/2012 1357   CHOLHDL 4.1 09/13/2012 1357   VLDL 28 09/13/2012 1357   LDLCALC 111 (H) 09/13/2012 1357   LDLDIRECT 141.3 10/14/2008 1004   ASSESSMENT:    1. Chronic combined systolic and diastolic CHF (congestive heart failure) (HCC)   2. ICD (implantable cardioverter-defibrillator) in place   3. Non-ischemic cardiomyopathy (HCC)   4. Long QT interval   5. Essential hypertension   6. CKD (chronic kidney disease) stage 4, GFR 15-29 ml/min (HCC)   7. Severe obesity (BMI 35.0-35.9 with comorbidity) (HCC)   8. Type 2 diabetes mellitus with obesity (HCC)   9. Dyslipidemia (high LDL; low HDL)     PLAN:  In order of problems listed above:  CHF: She has some subtle findings of hypervolemia on physical exam and her OptiVol is slightly out of range but improving.  Monthly downloads for her  thoracic impedance have been very valuable in preventing heart failure exacerbation.. Continue carvedilol  and hydralazine /nitrates, avoiding RAAS inhibitors due to CKD stage IV.   Nonischemic cardiomyopathy: Possibly secondary to chemotherapy. Normal coronary arteries by angiography in 2017.  Estimations of LV function have waxed and waned over the years, EF seems  to be stable around 30% for a while now.  History of possible Adriamycin chemotherapy for breast cancer in 1994, possible familial cardiomyopathy. Long QT: QRS is mildly prolonged due to a nonspecific IVCD and her QT is only moderately prolonged.  Her sister had long QT syndrome.  Patricia Morgan's QT interval has always been borderline prolonged usually in the 460-480 ms range.  Generally wise to avoid QT prolonging medications. ICD: Device function is normal.  She has never received therapies for tachycardia.  She does not require pacing.  Continue remote downloads every 3 months and monthly checks for OptiVol. Recurrent DVT/PE: No recent thromboembolic events.  Planned lifelong anticoagulation with factor V Leiden. Anticoagulation: Rate without bleeding problems. HTN:  well-controlled. OSA: Followed by Dr. Villa Greaser.  Reports compliance with CPAP and denies daytime hypersomnolence.   CKD 4: Appears to be stable.  GFR around 15.  Nephrologist with [facility], Dr. Lars Poche. Obesity: Associated with several comorbidities. DM: Excellent control.  Before her pneumonia in January her hemoglobin A1c was 5.4%.  Even after that it remains very much acceptable at 5.9%. HLP: All lipid parameters are in target range other than the low HDL.  Continue statin.   Medication Adjustments/Labs and Tests Ordered: Current medicines are reviewed at length with the patient today.  Concerns regarding medicines are outlined above.  Medication changes, Labs and Tests ordered today are listed in the Patient Instructions below. Patient Instructions  Medication Instructions:   No changes *If you need a refill on your cardiac medications before your next appointment, please call your pharmacy*  Follow-Up: At [facility], you and your health needs are our priority.  As part of our continuing mission to provide you with exceptional heart care, our providers are all part of one team.  This team includes your primary Cardiologist (physician) and Advanced Practice Providers or APPs (Physician Assistants and Nurse Practitioners) who all work together to provide you with the care you need, when you need it.  Your next appointment:   1 year(s)  Provider:   Luana Rumple, MD     We recommend signing up for the patient portal called "MyChart".  Sign up information is provided on this After Visit Summary.  MyChart is used to connect with patients for Virtual Visits (Telemedicine).  Patients are able to view lab/test results, encounter notes, upcoming appointments, etc.  Non-urgent messages can be sent to your provider as well.   To learn more about what you can do with MyChart, go to [email or web address].         1st Floor: - Lobby - Registration  - Pharmacy  - Lab - Cafe  2nd Floor: - PV Lab - Diagnostic Testing (echo, CT, nuclear med)  3rd Floor: - Vacant  4th Floor: - TCTS (cardiothoracic surgery) - AFib Clinic - Structural Heart Clinic - Vascular Surgery  - Vascular Ultrasound  5th Floor: - HeartCare Cardiology (general and EP) - Clinical Pharmacy for coumadin , hypertension, lipid, weight-loss medications, and med management appointments    Valet parking services will be available as well.      Signed, Luana Rumple, MD  09/08/2023 5:05 PM    [facility] [address] [place], [place], [place]  [address] Phone: [phone]; Fax: [phone]

## 2023-09-08 ENCOUNTER — Encounter: Payer: Self-pay | Admitting: Cardiovascular Disease

## 2023-09-18 DIAGNOSIS — G4733 Obstructive sleep apnea (adult) (pediatric): Secondary | ICD-10-CM | POA: Diagnosis not present

## 2023-09-27 ENCOUNTER — Other Ambulatory Visit: Payer: Self-pay | Admitting: Cardiovascular Disease

## 2023-09-28 ENCOUNTER — Other Ambulatory Visit: Payer: Self-pay | Admitting: Cardiovascular Disease

## 2023-10-02 ENCOUNTER — Ambulatory Visit (INDEPENDENT_AMBULATORY_CARE_PROVIDER_SITE_OTHER): Payer: Medicare Other

## 2023-10-02 DIAGNOSIS — I5042 Chronic combined systolic (congestive) and diastolic (congestive) heart failure: Secondary | ICD-10-CM

## 2023-10-02 DIAGNOSIS — I428 Other cardiomyopathies: Secondary | ICD-10-CM

## 2023-10-03 LAB — CUP PACEART REMOTE DEVICE CHECK
Battery Remaining Longevity: 74 mo
Battery Voltage: 2.98 V
Brady Statistic RV Percent Paced: 0.01 %
Date Time Interrogation Session: 20250519213427
HighPow Impedance: 74 Ohm
Implantable Lead Connection Status: 753985
Implantable Lead Implant Date: 20191030
Implantable Lead Location: 753860
Implantable Pulse Generator Implant Date: 20191030
Lead Channel Impedance Value: 589 Ohm
Lead Channel Impedance Value: 703 Ohm
Lead Channel Pacing Threshold Amplitude: 0.875 V
Lead Channel Pacing Threshold Pulse Width: 0.4 ms
Lead Channel Sensing Intrinsic Amplitude: 29.125 mV
Lead Channel Sensing Intrinsic Amplitude: 29.125 mV
Lead Channel Setting Pacing Amplitude: 2 V
Lead Channel Setting Pacing Pulse Width: 0.4 ms
Lead Channel Setting Sensing Sensitivity: 0.3 mV
Zone Setting Status: 755011
Zone Setting Status: 755011

## 2023-10-06 ENCOUNTER — Ambulatory Visit: Payer: Self-pay | Admitting: Cardiovascular Disease

## 2023-10-10 ENCOUNTER — Ambulatory Visit: Attending: Cardiovascular Disease

## 2023-10-10 DIAGNOSIS — I5042 Chronic combined systolic (congestive) and diastolic (congestive) heart failure: Secondary | ICD-10-CM

## 2023-10-10 DIAGNOSIS — Z95810 Presence of automatic (implantable) cardiac defibrillator: Secondary | ICD-10-CM

## 2023-10-13 NOTE — Progress Notes (Signed)
 EPIC Encounter for ICM Monitoring  Patient Name: Patricia Morgan is a 73 y.o. female Date: 10/13/2023 Primary Care Physican: Rae Bugler, MD Primary Cardiologist: Croitoru Electrophysiologist: Croitoru Nephrologist: Dr Manette Section at [facility]  07/15/2022  Weight: 185 lbs 08/14/2022 Weight: 197 lbs 08/23/2022   Weight: 194 lbs 09/19/2022   Weight: 194 lbs 01/04/2023 Weight: 190 lbs 02/07/2023 Weight: 192 lbs  06/30/2023 Weight: 192 lbs 09/07/2023 Office Weight: 203 lbs   Spoke with patient and heart failure questions reviewed.  Transmission results reviewed.  Pt asymptomatic for fluid accumulation.  Reports feeling well at this time and voices no complaints.  She will be in [place]  for the next month and will take her monitor with her.    Diet:  She is not strict on salt intake but does limit fluid intake to 64 oz daily.   Optivol Thoracic impedance suggesting intermittent days with possible fluid accumulation within the last month but remains stable.      Prescribed:  Furosemide  80 mg take 1 tablet(s) (80 mg total) by mouth daily.  She takes extra Lasix  40 mg when needed.     Labs: 06/01/2023 Creatinine 3.03, BUN 59, Potassium 3.7, Sodium 130, GFR 16  05/31/2023 Creatinine 3.14, BUN 53, Potassium 3.7, Sodium 134, GFR 15  05/30/2023 Creatinine 2.96, BUN 45, Potassium 4.0, Sodium 139, GFR 16  A complete set of results can be found in Results Review.   Recommendations:   No changes and encouraged to call if experiencing any fluid symptoms.   Follow-up plan: ICM clinic phone appointment on 11/27/2023.   91 day device clinic remote transmission 01/01/2024.     EP/Cardiology Office Visits:  Recall 09/06/2024 with Dr. Alvis Ba.     Copy of ICM check sent to Dr. Alvis Ba.   3 month ICM trend: 10/10/2023.    12-14 Month ICM trend:     Almyra Jain, RN 10/13/2023 8:33 AM

## 2023-10-24 DIAGNOSIS — G4733 Obstructive sleep apnea (adult) (pediatric): Secondary | ICD-10-CM | POA: Diagnosis not present

## 2023-10-25 ENCOUNTER — Other Ambulatory Visit: Payer: Self-pay | Admitting: Cardiovascular Disease

## 2023-11-16 DIAGNOSIS — I428 Other cardiomyopathies: Secondary | ICD-10-CM | POA: Diagnosis not present

## 2023-11-16 DIAGNOSIS — E1122 Type 2 diabetes mellitus with diabetic chronic kidney disease: Secondary | ICD-10-CM | POA: Diagnosis not present

## 2023-11-16 DIAGNOSIS — N184 Chronic kidney disease, stage 4 (severe): Secondary | ICD-10-CM | POA: Diagnosis not present

## 2023-11-16 DIAGNOSIS — I129 Hypertensive chronic kidney disease with stage 1 through stage 4 chronic kidney disease, or unspecified chronic kidney disease: Secondary | ICD-10-CM | POA: Diagnosis not present

## 2023-11-21 NOTE — Progress Notes (Signed)
 Remote ICD transmission.

## 2023-11-21 NOTE — Addendum Note (Signed)
 Addended by: TAWNI DRILLING D on: 11/21/2023 12:40 PM   Modules accepted: Orders

## 2023-11-27 ENCOUNTER — Ambulatory Visit: Attending: Cardiovascular Disease

## 2023-11-27 DIAGNOSIS — Z95810 Presence of automatic (implantable) cardiac defibrillator: Secondary | ICD-10-CM | POA: Diagnosis not present

## 2023-11-27 DIAGNOSIS — I5042 Chronic combined systolic (congestive) and diastolic (congestive) heart failure: Secondary | ICD-10-CM | POA: Diagnosis not present

## 2023-12-01 NOTE — Progress Notes (Signed)
 EPIC Encounter for ICM Monitoring  Patient Name: Patricia Morgan is a 73 y.o. female Date: 12/01/2023 Primary Care Physican: Seabron Lenis, MD Primary Cardiologist: Croitoru Electrophysiologist: Croitoru Nephrologist: Dr Loletha Buttery at [facility]  01/04/2023 Weight: 190 lbs 02/07/2023 Weight: 192 lbs  06/30/2023 Weight: 192 lbs 09/07/2023 Office Weight: 203 lbs 12/01/2023 Weight: 194-195 lbs   Spoke with patient and heart failure questions reviewed.  Transmission results reviewed.  Pt asymptomatic for fluid accumulation.  Reports feeling well at this time and voices no complaints.     Diet:  She is not strict on salt intake but does limit fluid intake to 64 oz daily.   Optivol Thoracic impedance suggesting normal fluid levels within the last month.      Prescribed:  Furosemide  80 mg take 1 tablet(s) (80 mg total) by mouth daily.  She takes extra Lasix  40 mg when needed.     Labs: 06/01/2023 Creatinine 3.03, BUN 59, Potassium 3.7, Sodium 130, GFR 16  05/31/2023 Creatinine 3.14, BUN 53, Potassium 3.7, Sodium 134, GFR 15  05/30/2023 Creatinine 2.96, BUN 45, Potassium 4.0, Sodium 139, GFR 16  A complete set of results can be found in Results Review.   Recommendations:   No changes and encouraged to call if experiencing any fluid symptoms.   Follow-up plan: ICM clinic phone appointment on 01/02/2024.   91 day device clinic remote transmission 01/01/2024.     EP/Cardiology Office Visits:  Recall 09/06/2024 with Dr. Francyne.     Copy of ICM check sent to Dr. Francyne.    3 month ICM trend: 11/27/2023.    12-14 Month ICM trend:     Patricia GORMAN Garner, RN 12/01/2023 4:17 PM

## 2023-12-05 DIAGNOSIS — E7800 Pure hypercholesterolemia, unspecified: Secondary | ICD-10-CM | POA: Diagnosis not present

## 2023-12-05 DIAGNOSIS — I428 Other cardiomyopathies: Secondary | ICD-10-CM | POA: Diagnosis not present

## 2023-12-05 DIAGNOSIS — R600 Localized edema: Secondary | ICD-10-CM | POA: Diagnosis not present

## 2023-12-05 DIAGNOSIS — D6851 Activated protein C resistance: Secondary | ICD-10-CM | POA: Diagnosis not present

## 2023-12-05 DIAGNOSIS — I272 Pulmonary hypertension, unspecified: Secondary | ICD-10-CM | POA: Diagnosis not present

## 2023-12-05 DIAGNOSIS — M109 Gout, unspecified: Secondary | ICD-10-CM | POA: Diagnosis not present

## 2023-12-05 DIAGNOSIS — I129 Hypertensive chronic kidney disease with stage 1 through stage 4 chronic kidney disease, or unspecified chronic kidney disease: Secondary | ICD-10-CM | POA: Diagnosis not present

## 2023-12-05 DIAGNOSIS — E559 Vitamin D deficiency, unspecified: Secondary | ICD-10-CM | POA: Diagnosis not present

## 2023-12-05 DIAGNOSIS — G4730 Sleep apnea, unspecified: Secondary | ICD-10-CM | POA: Diagnosis not present

## 2023-12-05 DIAGNOSIS — N184 Chronic kidney disease, stage 4 (severe): Secondary | ICD-10-CM | POA: Diagnosis not present

## 2023-12-05 DIAGNOSIS — E1122 Type 2 diabetes mellitus with diabetic chronic kidney disease: Secondary | ICD-10-CM | POA: Diagnosis not present

## 2023-12-05 DIAGNOSIS — D649 Anemia, unspecified: Secondary | ICD-10-CM | POA: Diagnosis not present

## 2023-12-08 ENCOUNTER — Telehealth: Payer: Self-pay | Admitting: Cardiovascular Disease

## 2023-12-08 NOTE — Telephone Encounter (Signed)
 Pt stated she was recently seen by her pcp on Tuesday and was informed that her BNT level is up so she needed to contact our office to see about possibly changing meds or adjustments if needed. Please advise

## 2023-12-08 NOTE — Telephone Encounter (Signed)
 Spoke with pt regarding her lab work. Pt stated that her PCP told her that her BNP level was elevated and that she should reach out to her cardiologist. The lab work has been faxed to us  however it does not appear that it has been added to the chart at this time. Pt was told that the information provided would be sent to Dr. Francyne for his suggestions. Pt verbalized understanding. All questions if any were answered.

## 2023-12-11 NOTE — Telephone Encounter (Signed)
  Hello, Patricia Morgan The BNP of 553 is higher than the formal reference range, but is not high for you.  During previous periods of heart failure exacerbation, your BNP has been in the 1,100-1,400 range. Your BNP has not been this low since  2018.  Patients with congestive heart failure generally never return their BNP levels to normal (less than 100), but have a personalbaseline that we can compare back to. My best estimate is that your baseline is roughly 500. Are you otherwise feeling OK? Dr. JAYSON Sous over the information above with the patient. She reports that she has not gained any weight and she is not short of breath. Says that her legs swell during the day, but go back down over night. I asked her if she is able to elevate her legs throughout the day and wear compression socks/stockings. She reports that when she is at home, she will do this sometimes. Informed her that it will help if she did these things during the day (instructed not to wear compression socks overnight).   Asked her to let us  know if she gains 2-3 pounds overnight or 5 pounds in one week and if she develops shortness of breath. She verbalized understanding

## 2023-12-14 DIAGNOSIS — I428 Other cardiomyopathies: Secondary | ICD-10-CM | POA: Diagnosis not present

## 2023-12-14 DIAGNOSIS — M810 Age-related osteoporosis without current pathological fracture: Secondary | ICD-10-CM | POA: Diagnosis not present

## 2023-12-14 DIAGNOSIS — E7800 Pure hypercholesterolemia, unspecified: Secondary | ICD-10-CM | POA: Diagnosis not present

## 2023-12-14 DIAGNOSIS — Z853 Personal history of malignant neoplasm of breast: Secondary | ICD-10-CM | POA: Diagnosis not present

## 2023-12-14 DIAGNOSIS — N184 Chronic kidney disease, stage 4 (severe): Secondary | ICD-10-CM | POA: Diagnosis not present

## 2023-12-14 DIAGNOSIS — E1122 Type 2 diabetes mellitus with diabetic chronic kidney disease: Secondary | ICD-10-CM | POA: Diagnosis not present

## 2023-12-14 DIAGNOSIS — I129 Hypertensive chronic kidney disease with stage 1 through stage 4 chronic kidney disease, or unspecified chronic kidney disease: Secondary | ICD-10-CM | POA: Diagnosis not present

## 2023-12-24 DIAGNOSIS — G4733 Obstructive sleep apnea (adult) (pediatric): Secondary | ICD-10-CM | POA: Diagnosis not present

## 2023-12-27 DIAGNOSIS — N184 Chronic kidney disease, stage 4 (severe): Secondary | ICD-10-CM | POA: Diagnosis not present

## 2024-01-01 ENCOUNTER — Ambulatory Visit: Payer: Medicare Other | Attending: Cardiovascular Disease

## 2024-01-01 DIAGNOSIS — I428 Other cardiomyopathies: Secondary | ICD-10-CM

## 2024-01-02 ENCOUNTER — Ambulatory Visit: Attending: Cardiovascular Disease

## 2024-01-02 DIAGNOSIS — I5042 Chronic combined systolic (congestive) and diastolic (congestive) heart failure: Secondary | ICD-10-CM

## 2024-01-02 DIAGNOSIS — Z95810 Presence of automatic (implantable) cardiac defibrillator: Secondary | ICD-10-CM

## 2024-01-03 LAB — CUP PACEART REMOTE DEVICE CHECK
Battery Remaining Longevity: 64 mo
Battery Voltage: 2.99 V
Brady Statistic RV Percent Paced: 0.02 %
Date Time Interrogation Session: 20250818052825
HighPow Impedance: 77 Ohm
Implantable Lead Connection Status: 753985
Implantable Lead Implant Date: 20191030
Implantable Lead Location: 753860
Implantable Pulse Generator Implant Date: 20191030
Lead Channel Impedance Value: 608 Ohm
Lead Channel Impedance Value: 703 Ohm
Lead Channel Pacing Threshold Amplitude: 0.875 V
Lead Channel Pacing Threshold Pulse Width: 0.4 ms
Lead Channel Sensing Intrinsic Amplitude: 24.875 mV
Lead Channel Sensing Intrinsic Amplitude: 24.875 mV
Lead Channel Setting Pacing Amplitude: 2 V
Lead Channel Setting Pacing Pulse Width: 0.4 ms
Lead Channel Setting Sensing Sensitivity: 0.3 mV
Zone Setting Status: 755011
Zone Setting Status: 755011

## 2024-01-04 DIAGNOSIS — Z961 Presence of intraocular lens: Secondary | ICD-10-CM | POA: Diagnosis not present

## 2024-01-04 DIAGNOSIS — H35073 Retinal telangiectasis, bilateral: Secondary | ICD-10-CM | POA: Diagnosis not present

## 2024-01-04 DIAGNOSIS — E113212 Type 2 diabetes mellitus with mild nonproliferative diabetic retinopathy with macular edema, left eye: Secondary | ICD-10-CM | POA: Diagnosis not present

## 2024-01-04 DIAGNOSIS — H43823 Vitreomacular adhesion, bilateral: Secondary | ICD-10-CM | POA: Diagnosis not present

## 2024-01-07 ENCOUNTER — Ambulatory Visit: Payer: Self-pay | Admitting: Cardiovascular Disease

## 2024-01-08 NOTE — Progress Notes (Unsigned)
 EPIC Encounter for ICM Monitoring  Patient Name: Patricia Morgan is a 73 y.o. female Date: 01/08/2024 Primary Care Physican: Patricia Lenis, MD Primary Cardiologist: Patricia Morgan Electrophysiologist: Patricia Morgan Nephrologist: Dr Patricia Morgan at [facility]  01/04/2023 Weight: 190 lbs 02/07/2023 Weight: 192 lbs  06/30/2023 Weight: 192 lbs 09/07/2023 Office Weight: 203 lbs 12/01/2023 Weight: 194-195 lbs   Spoke with patient and heart failure questions reviewed.  Transmission results reviewed.  Pt asymptomatic for fluid accumulation.  Reports feeling well at this time and voices no complaints.     Diet:  She is not strict on salt intake but does limit fluid intake to 64 oz daily.   Optivol Thoracic impedance suggesting intermittent days with possible fluid accumulation within the last month.      Prescribed:  Furosemide  80 mg take 1 tablet(s) (80 mg total) by mouth daily.  She takes extra Lasix  40 mg when needed.     Labs: 06/01/2023 Creatinine 3.03, BUN 59, Potassium 3.7, Sodium 130, GFR 16  05/31/2023 Creatinine 3.14, BUN 53, Potassium 3.7, Sodium 134, GFR 15  05/30/2023 Creatinine 2.96, BUN 45, Potassium 4.0, Sodium 139, GFR 16  A complete set of results can be found in Results Review.   Recommendations:   No changes and encouraged to call if experiencing any fluid symptoms.   Follow-up plan: ICM clinic phone appointment on 02/02/2024.   91 day device clinic remote transmission 04/01/2024.     EP/Cardiology Office Visits:  Recall 09/06/2024 with Dr. Francyne.     Copy of ICM check sent to Dr. Francyne.    3 month ICM trend: 01/01/2024.    12-14 Month ICM trend:     Patricia GORMAN Garner, RN 01/08/2024 10:50 AM

## 2024-01-14 DIAGNOSIS — I129 Hypertensive chronic kidney disease with stage 1 through stage 4 chronic kidney disease, or unspecified chronic kidney disease: Secondary | ICD-10-CM | POA: Diagnosis not present

## 2024-01-14 DIAGNOSIS — Z853 Personal history of malignant neoplasm of breast: Secondary | ICD-10-CM | POA: Diagnosis not present

## 2024-01-14 DIAGNOSIS — N184 Chronic kidney disease, stage 4 (severe): Secondary | ICD-10-CM | POA: Diagnosis not present

## 2024-01-14 DIAGNOSIS — E1122 Type 2 diabetes mellitus with diabetic chronic kidney disease: Secondary | ICD-10-CM | POA: Diagnosis not present

## 2024-01-14 DIAGNOSIS — E7800 Pure hypercholesterolemia, unspecified: Secondary | ICD-10-CM | POA: Diagnosis not present

## 2024-01-14 DIAGNOSIS — M810 Age-related osteoporosis without current pathological fracture: Secondary | ICD-10-CM | POA: Diagnosis not present

## 2024-01-14 DIAGNOSIS — I428 Other cardiomyopathies: Secondary | ICD-10-CM | POA: Diagnosis not present

## 2024-02-02 ENCOUNTER — Ambulatory Visit: Attending: Cardiovascular Disease

## 2024-02-02 DIAGNOSIS — I5042 Chronic combined systolic (congestive) and diastolic (congestive) heart failure: Secondary | ICD-10-CM | POA: Diagnosis not present

## 2024-02-02 DIAGNOSIS — Z95810 Presence of automatic (implantable) cardiac defibrillator: Secondary | ICD-10-CM | POA: Diagnosis not present

## 2024-02-02 NOTE — Progress Notes (Signed)
 EPIC Encounter for ICM Monitoring  Patient Name: Patricia Morgan is a 73 y.o. female Date: 02/02/2024 Primary Care Physican: Seabron Lenis, MD Primary Cardiologist: Croitoru Electrophysiologist: Croitoru Nephrologist: Dr Loletha Buttery at [facility]  01/04/2023 Weight: 190 lbs 02/07/2023 Weight: 192 lbs  06/30/2023 Weight: 192 lbs 09/07/2023 Office Weight: 203 lbs 12/01/2023 Weight: 194-195 lbs 02/02/2024 Weight: 200 lbs   Spoke with patient and heart failure questions reviewed.  Transmission results reviewed.  Pt reports SOB and 3 lb weight gain over the last week.    Diet:  She is not strict on salt intake but does limit fluid intake to 64 oz daily.   Optivol Thoracic impedance suggesting possible fluid accumulation 9/6.      Prescribed:  Furosemide  80 mg take 1 tablet(s) (80 mg total) by mouth daily.  She takes extra Lasix  40 mg when needed.     Labs: 06/01/2023 Creatinine 3.03, BUN 59, Potassium 3.7, Sodium 130, GFR 16  05/31/2023 Creatinine 3.14, BUN 53, Potassium 3.7, Sodium 134, GFR 15  05/30/2023 Creatinine 2.96, BUN 45, Potassium 4.0, Sodium 139, GFR 16  A complete set of results can be found in Results Review.   Recommendations:   She self adjusts Furosemide  when needed.  Her plan is take extra 40 mg of Lasix  the next 3 days.  Advised to call Dr Croitoru's office next week if fluid symptoms are not resolved or use ER if needed.     Follow-up plan: ICM clinic phone appointment on 02/12/2024 to recheck fluid levels.   91 day device clinic remote transmission 04/01/2024.     EP/Cardiology Office Visits:  Recall 09/06/2024 with Dr. Francyne.     Copy of ICM check sent to Dr. Francyne.    3 month ICM trend: 02/02/2024.    12-14 Month ICM trend:     [name], RN 02/02/2024 12:48 PM

## 2024-02-07 NOTE — Progress Notes (Signed)
Remote ICD Transmission.

## 2024-02-08 DIAGNOSIS — I129 Hypertensive chronic kidney disease with stage 1 through stage 4 chronic kidney disease, or unspecified chronic kidney disease: Secondary | ICD-10-CM | POA: Diagnosis not present

## 2024-02-08 DIAGNOSIS — N184 Chronic kidney disease, stage 4 (severe): Secondary | ICD-10-CM | POA: Diagnosis not present

## 2024-02-08 DIAGNOSIS — I428 Other cardiomyopathies: Secondary | ICD-10-CM | POA: Diagnosis not present

## 2024-02-08 DIAGNOSIS — E1122 Type 2 diabetes mellitus with diabetic chronic kidney disease: Secondary | ICD-10-CM | POA: Diagnosis not present

## 2024-02-12 ENCOUNTER — Ambulatory Visit: Attending: Cardiovascular Disease

## 2024-02-12 DIAGNOSIS — I5042 Chronic combined systolic (congestive) and diastolic (congestive) heart failure: Secondary | ICD-10-CM

## 2024-02-12 DIAGNOSIS — Z95810 Presence of automatic (implantable) cardiac defibrillator: Secondary | ICD-10-CM

## 2024-02-13 DIAGNOSIS — N184 Chronic kidney disease, stage 4 (severe): Secondary | ICD-10-CM | POA: Diagnosis not present

## 2024-02-13 DIAGNOSIS — M810 Age-related osteoporosis without current pathological fracture: Secondary | ICD-10-CM | POA: Diagnosis not present

## 2024-02-13 DIAGNOSIS — E1122 Type 2 diabetes mellitus with diabetic chronic kidney disease: Secondary | ICD-10-CM | POA: Diagnosis not present

## 2024-02-13 DIAGNOSIS — Z853 Personal history of malignant neoplasm of breast: Secondary | ICD-10-CM | POA: Diagnosis not present

## 2024-02-13 DIAGNOSIS — E7800 Pure hypercholesterolemia, unspecified: Secondary | ICD-10-CM | POA: Diagnosis not present

## 2024-02-13 DIAGNOSIS — I428 Other cardiomyopathies: Secondary | ICD-10-CM | POA: Diagnosis not present

## 2024-02-13 DIAGNOSIS — I129 Hypertensive chronic kidney disease with stage 1 through stage 4 chronic kidney disease, or unspecified chronic kidney disease: Secondary | ICD-10-CM | POA: Diagnosis not present

## 2024-02-14 ENCOUNTER — Telehealth: Payer: Self-pay

## 2024-02-14 NOTE — Progress Notes (Signed)
 EPIC Encounter for ICM Monitoring  Patient Name: Patricia Morgan is a 73 y.o. female Date: 02/14/2024 Primary Care Physican: Seabron Lenis, MD Primary Cardiologist: Croitoru Electrophysiologist: Croitoru Nephrologist: Dr Loletha Buttery at [facility]  01/04/2023 Weight: 190 lbs 02/07/2023 Weight: 192 lbs  06/30/2023 Weight: 192 lbs 09/07/2023 Office Weight: 203 lbs 12/01/2023 Weight: 194-195 lbs 02/02/2024 Weight: 200 lbs   Attempted call to patient and unable to reach.  Left detailed message per DPR regarding transmission.  Transmission results reviewed.    Diet:  She is not strict on salt intake but does limit fluid intake to 64 oz daily.    Since 02/02/2024 ICM Remote Transmission: Optivol Thoracic impedance returned to baseline on 02/08/2024 after taking extra Lasix  x 3 days.      Prescribed:  Furosemide  80 mg take 1 tablet(s) (80 mg total) by mouth daily.  She takes extra Lasix  40 mg when needed.     Labs: 06/01/2023 Creatinine 3.03, BUN 59, Potassium 3.7, Sodium 130, GFR 16  05/31/2023 Creatinine 3.14, BUN 53, Potassium 3.7, Sodium 134, GFR 15  05/30/2023 Creatinine 2.96, BUN 45, Potassium 4.0, Sodium 139, GFR 16  A complete set of results can be found in Results Review.   Recommendations:   Left voice mail with ICM number and encouraged to call if experiencing any fluid symptoms.   Follow-up plan: ICM clinic phone appointment on 03/11/2024.   91 day device clinic remote transmission 04/01/2024.     EP/Cardiology Office Visits:  Recall 09/06/2024 with Dr. Francyne.     Copy of ICM check sent to Dr. Francyne.    Remote monitoring is medically necessary for Heart Failure Management.    90 day Daily Thoracic Impedance ICM trend: 11/13/2023 through 02/12/2024.    12-14 Month Thoracic Impedance ICM trend:     [name], RN 02/14/2024 8:12 AM

## 2024-02-14 NOTE — Telephone Encounter (Signed)
 Remote ICM transmission received.  Attempted call to patient regarding ICM remote transmission.  Left detailed message per DPR with ICM phone number to return call for any questions, concerns or fluid symptoms.

## 2024-03-09 DIAGNOSIS — I129 Hypertensive chronic kidney disease with stage 1 through stage 4 chronic kidney disease, or unspecified chronic kidney disease: Secondary | ICD-10-CM | POA: Diagnosis not present

## 2024-03-09 DIAGNOSIS — E1122 Type 2 diabetes mellitus with diabetic chronic kidney disease: Secondary | ICD-10-CM | POA: Diagnosis not present

## 2024-03-09 DIAGNOSIS — I428 Other cardiomyopathies: Secondary | ICD-10-CM | POA: Diagnosis not present

## 2024-03-09 DIAGNOSIS — N184 Chronic kidney disease, stage 4 (severe): Secondary | ICD-10-CM | POA: Diagnosis not present

## 2024-03-11 ENCOUNTER — Ambulatory Visit: Attending: Cardiovascular Disease

## 2024-03-11 DIAGNOSIS — Z95810 Presence of automatic (implantable) cardiac defibrillator: Secondary | ICD-10-CM

## 2024-03-11 DIAGNOSIS — I5042 Chronic combined systolic (congestive) and diastolic (congestive) heart failure: Secondary | ICD-10-CM | POA: Diagnosis not present

## 2024-03-13 NOTE — Progress Notes (Signed)
 EPIC Encounter for ICM Monitoring  Patient Name: Patricia Morgan is a 73 y.o. female Date: 03/13/2024 Primary Care Physican: Seabron Lenis, MD Primary Cardiologist: Croitoru Electrophysiologist: Croitoru Nephrologist: Dr Loletha Buttery at [facility]  01/04/2023 Weight: 190 lbs 02/07/2023 Weight: 192 lbs  06/30/2023 Weight: 192 lbs 09/07/2023 Office Weight: 203 lbs 12/01/2023 Weight: 194-195 lbs 02/02/2024 Weight: 200 lbs 03/13/2024 Weight: 198 lbs   Spoke with patient and heart failure questions reviewed.  Transmission results reviewed.  Pt asymptomatic for fluid accumulation.  Reports feeling well at this time and voices no complaints.  Takes extra Lasix  when she gets SOB.     Diet:  She is not strict on salt intake but does limit fluid intake to 64 oz daily.    Since 02/12/2024 ICM Remote Transmission: Optivol Thoracic impedance suggesting intermittent days with possible fluid accumulation.      Prescribed:  Furosemide  80 mg take 1 tablet(s) (80 mg total) by mouth daily.  She takes extra Lasix  40 mg when needed.     Labs: 06/01/2023 Creatinine 3.03, BUN 59, Potassium 3.7, Sodium 130, GFR 16  05/31/2023 Creatinine 3.14, BUN 53, Potassium 3.7, Sodium 134, GFR 15  05/30/2023 Creatinine 2.96, BUN 45, Potassium 4.0, Sodium 139, GFR 16  A complete set of results can be found in Results Review.   Recommendations:   No changes and encouraged to call if experiencing any fluid symptoms.   Follow-up plan: ICM clinic phone appointment on 04/15/2024.   91 day device clinic remote transmission 04/01/2024.     EP/Cardiology Office Visits:  Recall 09/06/2024 with Dr. Francyne.     Copy of ICM check sent to Dr. Francyne.      Remote monitoring is medically necessary for Heart Failure Management.    Daily Thoracic Impedance ICM trend: 12/11/2023 through 03/11/2024.    12-14 Month Thoracic Impedance ICM trend:     Patricia GORMAN Garner, RN 03/13/2024 5:02 PM

## 2024-03-15 DIAGNOSIS — E7800 Pure hypercholesterolemia, unspecified: Secondary | ICD-10-CM | POA: Diagnosis not present

## 2024-03-15 DIAGNOSIS — Z853 Personal history of malignant neoplasm of breast: Secondary | ICD-10-CM | POA: Diagnosis not present

## 2024-03-15 DIAGNOSIS — I129 Hypertensive chronic kidney disease with stage 1 through stage 4 chronic kidney disease, or unspecified chronic kidney disease: Secondary | ICD-10-CM | POA: Diagnosis not present

## 2024-03-15 DIAGNOSIS — N184 Chronic kidney disease, stage 4 (severe): Secondary | ICD-10-CM | POA: Diagnosis not present

## 2024-03-15 DIAGNOSIS — M810 Age-related osteoporosis without current pathological fracture: Secondary | ICD-10-CM | POA: Diagnosis not present

## 2024-03-15 DIAGNOSIS — I428 Other cardiomyopathies: Secondary | ICD-10-CM | POA: Diagnosis not present

## 2024-03-15 DIAGNOSIS — E1122 Type 2 diabetes mellitus with diabetic chronic kidney disease: Secondary | ICD-10-CM | POA: Diagnosis not present

## 2024-04-01 ENCOUNTER — Ambulatory Visit (INDEPENDENT_AMBULATORY_CARE_PROVIDER_SITE_OTHER): Payer: Medicare Other

## 2024-04-01 DIAGNOSIS — I428 Other cardiomyopathies: Secondary | ICD-10-CM

## 2024-04-01 LAB — CUP PACEART REMOTE DEVICE CHECK
Battery Remaining Longevity: 60 mo
Battery Voltage: 2.99 V
Brady Statistic RV Percent Paced: 0.01 %
Date Time Interrogation Session: 20251117012304
HighPow Impedance: 61 Ohm
Implantable Lead Connection Status: 753985
Implantable Lead Implant Date: 20191030
Implantable Lead Location: 753860
Implantable Pulse Generator Implant Date: 20191030
Lead Channel Impedance Value: 589 Ohm
Lead Channel Impedance Value: 665 Ohm
Lead Channel Pacing Threshold Amplitude: 1.125 V
Lead Channel Pacing Threshold Pulse Width: 0.4 ms
Lead Channel Sensing Intrinsic Amplitude: 15.75 mV
Lead Channel Sensing Intrinsic Amplitude: 15.75 mV
Lead Channel Setting Pacing Amplitude: 2.25 V
Lead Channel Setting Pacing Pulse Width: 0.4 ms
Lead Channel Setting Sensing Sensitivity: 0.3 mV
Zone Setting Status: 755011
Zone Setting Status: 755011

## 2024-04-03 NOTE — Progress Notes (Signed)
 Remote ICD Transmission

## 2024-04-08 ENCOUNTER — Ambulatory Visit: Payer: Self-pay | Admitting: Cardiovascular Disease

## 2024-04-10 ENCOUNTER — Other Ambulatory Visit: Payer: Self-pay | Admitting: Cardiovascular Disease

## 2024-04-29 ENCOUNTER — Ambulatory Visit

## 2024-04-29 DIAGNOSIS — I5042 Chronic combined systolic (congestive) and diastolic (congestive) heart failure: Secondary | ICD-10-CM

## 2024-04-29 DIAGNOSIS — Z95810 Presence of automatic (implantable) cardiac defibrillator: Secondary | ICD-10-CM | POA: Diagnosis not present

## 2024-05-03 NOTE — Progress Notes (Signed)
 EPIC Encounter for ICM Monitoring  Patient Name: Patricia Morgan is a 73 y.o. female Date: 05/03/2024 Primary Care Physican: Seabron Lenis, MD Primary Cardiologist: Croitoru Electrophysiologist: Croitoru Nephrologist: Dr Loletha Buttery at [facility]  01/04/2023 Weight: 190 lbs 02/07/2023 Weight: 192 lbs  06/30/2023 Weight: 192 lbs 09/07/2023 Office Weight: 203 lbs 12/01/2023 Weight: 194-195 lbs 02/02/2024 Weight: 200 lbs 03/13/2024 Weight: 198 lbs 05/03/2024 Weight: 192 lbs   Spoke with patient and heart failure questions reviewed.  Transmission results reviewed.  Pt asymptomatic for fluid accumulation.  Reports feeling well at this time and voices no complaints.     Diet:  She is not strict on salt intake but does limit fluid intake to 64 oz daily.    Since 03/11/2024 ICM Remote Transmission: Optivol Thoracic impedance suggesting normal fluid levels after 03/27/2024.      Prescribed:  Furosemide  80 mg take 1 tablet(s) (80 mg total) by mouth daily.  She takes extra Lasix  40 mg when SOB as needed.   Labs: 06/01/2023 Creatinine 3.03, BUN 59, Potassium 3.7, Sodium 130, GFR 16  05/31/2023 Creatinine 3.14, BUN 53, Potassium 3.7, Sodium 134, GFR 15  05/30/2023 Creatinine 2.96, BUN 45, Potassium 4.0, Sodium 139, GFR 16  A complete set of results can be found in Results Review.   Recommendations:   No changes and encouraged to call if experiencing any fluid symptoms.   Follow-up plan: ICM clinic phone appointment on 06/03/2024.   91 day device clinic remote transmission 07/01/2024.     EP/Cardiology Office Visits:  Recall 09/06/2024 with Dr. Francyne.     Copy of ICM check sent to Dr. Francyne.      Remote monitoring is medically necessary for Heart Failure Management.    Daily Thoracic Impedance ICM trend: 01/29/2024 through 04/29/2024.    12-14 Month Thoracic Impedance ICM trend:     Patricia GORMAN Garner, RN 05/03/2024 1:49 PM

## 2024-05-16 ENCOUNTER — Other Ambulatory Visit: Payer: Self-pay | Admitting: Cardiovascular Disease

## 2024-06-03 ENCOUNTER — Ambulatory Visit: Attending: Cardiovascular Disease

## 2024-06-03 DIAGNOSIS — Z95810 Presence of automatic (implantable) cardiac defibrillator: Secondary | ICD-10-CM | POA: Diagnosis not present

## 2024-06-03 DIAGNOSIS — I5042 Chronic combined systolic (congestive) and diastolic (congestive) heart failure: Secondary | ICD-10-CM | POA: Diagnosis not present

## 2024-06-05 NOTE — Progress Notes (Signed)
 EPIC Encounter for ICM Monitoring  Patient Name: Patricia Morgan is a 74 y.o. female Date: 06/05/2024 Primary Care Physican: Seabron Lenis, MD Primary Cardiologist: Croitoru Electrophysiologist: Croitoru Nephrologist: Dr Loletha Buttery at [facility]  01/04/2023 Weight: 190 lbs 02/07/2023 Weight: 192 lbs  06/30/2023 Weight: 192 lbs 09/07/2023 Office Weight: 203 lbs 12/01/2023 Weight: 194-195 lbs 02/02/2024 Weight: 200 lbs 03/13/2024 Weight: 198 lbs 05/03/2024 Weight: 192 lbs   Spoke with patient and heart failure questions reviewed.  Transmission results reviewed.  Pt asymptomatic for fluid accumulation.  Reports feeling well at this time and voices no complaints.     Diet:  She is not strict on salt intake but does limit fluid intake to 64 oz daily.    Since 04/29/2024 ICM Remote Transmission: Optivol Thoracic impedance suggesting normal fluid levels.      Prescribed:  Furosemide  80 mg take 1 tablet(s) (80 mg total) by mouth daily.  She takes extra Lasix  40 mg when SOB as needed.   Labs: 06/01/2023 Creatinine 3.03, BUN 59, Potassium 3.7, Sodium 130, GFR 16  05/31/2023 Creatinine 3.14, BUN 53, Potassium 3.7, Sodium 134, GFR 15  05/30/2023 Creatinine 2.96, BUN 45, Potassium 4.0, Sodium 139, GFR 16  A complete set of results can be found in Results Review.   Recommendations:   No changes and encouraged to call if experiencing any fluid symptoms.   Follow-up plan: ICM clinic phone appointment on 07/08/2024.   91 day device clinic remote transmission 07/01/2024.     EP/Cardiology Office Visits:  Recall 09/06/2024 with Dr. Francyne.     Copy of ICM check sent to Dr. Francyne.      Remote monitoring is medically necessary for Heart Failure Management.    Daily Thoracic Impedance ICM trend: 03/04/2024 through 06/03/2024.    12-14 Month Thoracic Impedance ICM trend:     [name], RN 06/05/2024 4:25 PM

## 2024-06-13 NOTE — Progress Notes (Signed)
 31 day ICM Remote transmission canceled due to [facility] clinic is on hold until further notice.  91 day remote monitoring will continue per protocol.

## 2024-07-08 ENCOUNTER — Ambulatory Visit

## 2024-09-30 ENCOUNTER — Encounter

## 2024-12-30 ENCOUNTER — Encounter

## 2025-03-31 ENCOUNTER — Encounter

## 2025-06-30 ENCOUNTER — Encounter
